# Patient Record
Sex: Male | Born: 1975 | Race: White | Hispanic: No | Marital: Married | Smoking: Former smoker
Health system: Southern US, Community
[De-identification: ages and names within clinical notes are randomized; demographics above are authoritative.]

## PROBLEM LIST (undated history)

## (undated) DIAGNOSIS — M1990 Unspecified osteoarthritis, unspecified site: Secondary | ICD-10-CM

## (undated) DIAGNOSIS — F32A Depression, unspecified: Secondary | ICD-10-CM

## (undated) DIAGNOSIS — F329 Major depressive disorder, single episode, unspecified: Secondary | ICD-10-CM

## (undated) DIAGNOSIS — G4733 Obstructive sleep apnea (adult) (pediatric): Secondary | ICD-10-CM

## (undated) DIAGNOSIS — F419 Anxiety disorder, unspecified: Secondary | ICD-10-CM

## (undated) DIAGNOSIS — E119 Type 2 diabetes mellitus without complications: Secondary | ICD-10-CM

## (undated) DIAGNOSIS — R42 Dizziness and giddiness: Secondary | ICD-10-CM

## (undated) DIAGNOSIS — R1115 Cyclical vomiting syndrome unrelated to migraine: Secondary | ICD-10-CM

## (undated) HISTORY — DX: Dizziness and giddiness: R42

## (undated) HISTORY — DX: Anxiety disorder, unspecified: F41.9

## (undated) HISTORY — DX: Obstructive sleep apnea (adult) (pediatric): G47.33

## (undated) HISTORY — DX: Depression, unspecified: F32.A

## (undated) HISTORY — DX: Unspecified osteoarthritis, unspecified site: M19.90

---
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
## 2004-12-12 DIAGNOSIS — E782 Mixed hyperlipidemia: Secondary | ICD-10-CM | POA: Insufficient documentation

## 2006-07-31 ENCOUNTER — Emergency Department: Payer: Self-pay | Admitting: Emergency Medicine

## 2008-03-11 ENCOUNTER — Ambulatory Visit: Payer: Self-pay | Admitting: Family Medicine

## 2008-06-17 HISTORY — PX: CHOLECYSTECTOMY: SHX55

## 2008-10-03 DIAGNOSIS — Z8249 Family history of ischemic heart disease and other diseases of the circulatory system: Secondary | ICD-10-CM | POA: Insufficient documentation

## 2008-11-20 ENCOUNTER — Encounter: Admission: RE | Admit: 2008-11-20 | Discharge: 2008-11-20 | Payer: Self-pay | Admitting: Psychiatry

## 2008-11-25 ENCOUNTER — Ambulatory Visit (HOSPITAL_COMMUNITY): Admission: RE | Admit: 2008-11-25 | Discharge: 2008-11-25 | Payer: Self-pay | Admitting: General Surgery

## 2008-11-25 ENCOUNTER — Encounter (INDEPENDENT_AMBULATORY_CARE_PROVIDER_SITE_OTHER): Payer: Self-pay | Admitting: General Surgery

## 2008-12-01 DIAGNOSIS — G44009 Cluster headache syndrome, unspecified, not intractable: Secondary | ICD-10-CM | POA: Insufficient documentation

## 2009-01-29 ENCOUNTER — Encounter: Admission: RE | Admit: 2009-01-29 | Discharge: 2009-01-29 | Payer: Self-pay | Admitting: Specialist

## 2009-01-31 ENCOUNTER — Ambulatory Visit (HOSPITAL_COMMUNITY): Admission: RE | Admit: 2009-01-31 | Discharge: 2009-01-31 | Payer: Self-pay | Admitting: Gastroenterology

## 2009-05-09 ENCOUNTER — Ambulatory Visit: Payer: Self-pay | Admitting: Family Medicine

## 2009-08-08 ENCOUNTER — Observation Stay: Payer: Self-pay | Admitting: Internal Medicine

## 2010-03-14 ENCOUNTER — Ambulatory Visit: Payer: Self-pay | Admitting: Family Medicine

## 2010-03-17 ENCOUNTER — Ambulatory Visit: Payer: Self-pay | Admitting: Family Medicine

## 2010-07-08 ENCOUNTER — Encounter: Payer: Self-pay | Admitting: Family Medicine

## 2010-07-08 DIAGNOSIS — G4733 Obstructive sleep apnea (adult) (pediatric): Secondary | ICD-10-CM | POA: Insufficient documentation

## 2010-09-24 LAB — CBC
HCT: 43.7 % (ref 39.0–52.0)
Hemoglobin: 14.8 g/dL (ref 13.0–17.0)
MCHC: 33.8 g/dL (ref 30.0–36.0)
MCV: 83 fL (ref 78.0–100.0)
Platelets: 325 10*3/uL (ref 150–400)
RBC: 5.27 MIL/uL (ref 4.22–5.81)
RDW: 15.3 % (ref 11.5–15.5)
WBC: 15.9 10*3/uL — ABNORMAL HIGH (ref 4.0–10.5)

## 2010-09-24 LAB — COMPREHENSIVE METABOLIC PANEL
ALT: 50 U/L (ref 0–53)
AST: 32 U/L (ref 0–37)
Albumin: 3.7 g/dL (ref 3.5–5.2)
Alkaline Phosphatase: 56 U/L (ref 39–117)
BUN: 16 mg/dL (ref 6–23)
CO2: 28 mEq/L (ref 19–32)
Calcium: 9.6 mg/dL (ref 8.4–10.5)
Chloride: 104 mEq/L (ref 96–112)
Creatinine, Ser: 0.72 mg/dL (ref 0.4–1.5)
GFR calc Af Amer: >60 mL/min (ref >60)
GFR calc non Af Amer: >60 mL/min (ref >60)
Glucose, Bld: 107 mg/dL — ABNORMAL HIGH (ref 70–99)
Potassium: 4.4 mEq/L (ref 3.5–5.1)
Sodium: 141 mEq/L (ref 135–145)
Total Bilirubin: 0.4 mg/dL (ref 0.3–1.2)
Total Protein: 7.5 g/dL (ref 6.0–8.3)

## 2010-09-24 LAB — DIFFERENTIAL
Basophils Absolute: 0.1 10*3/uL (ref 0.0–0.1)
Basophils Relative: 0 % (ref 0–1)
Eosinophils Absolute: 0.1 10*3/uL (ref 0.0–0.7)
Eosinophils Relative: 1 % (ref 0–5)
Lymphocytes Relative: 16 % (ref 12–46)
Lymphs Abs: 2.6 10*3/uL (ref 0.7–4.0)
Monocytes Absolute: 0.8 10*3/uL (ref 0.1–1.0)
Monocytes Relative: 5 % (ref 3–12)
Neutro Abs: 12.4 10*3/uL — ABNORMAL HIGH (ref 1.7–7.7)
Neutrophils Relative %: 78 % — ABNORMAL HIGH (ref 43–77)

## 2010-10-30 NOTE — Op Note (Signed)
NAMEBRONISLAUS, VERDELL               ACCOUNT NO.:  [id number]   MEDICAL RECORD NO.:  [id number]          PATIENT TYPE:  AMB   LOCATION:  DAY                          FACILITY:  [facility]   PHYSICIAN:  Cherylynn Ridges, M.D.    DATE OF BIRTH:  06-07-1976   DATE OF PROCEDURE:  11/25/2008  DATE OF DISCHARGE:                               OPERATIVE REPORT   Patient of Dr. Sherrie Mustache and Dr. Jeani Hawking.   PREOPERATIVE DIAGNOSIS:  Symptomatic cholelithiasis.   POSTOPERATIVE DIAGNOSIS:  Symptomatic cholelithiasis.   PROCEDURE:  Laparoscopic cholecystectomy with cholangiogram.   SURGEON:  Dr. Lindie Spruce.   ASSISTANT:  Dr. Derrell Lolling.   ANESTHESIA:  General endotracheal.   ESTIMATED BLOOD LOSS:  Less than 20 mL.   COMPLICATIONS:  None.   CONDITION:  Stable.   FINDINGS:  Normal intraoperative cholangiogram with a 3-cm gallstone  freely floating in the gallbladder.   INDICATIONS FOR OPERATION:  The patient is a 35 year old who had been  symptomatic from a large stone that had been lodged in the neck of his  gallbladder who comes in now for an elective laparoscopic  cholecystectomy.   OPERATION:  The patient was taken to the operating room and placed on  the table in supine position.  After an adequate general endotracheal  anesthetic was administered, he was prepped and draped in usual sterile  manner exposing the midline and the right upper quadrant.   A supraumbilical midline incision was made using a #15 blade and taken  down to the midline fascia.  We grabbed the fascia with two Kocher  clamps and then incised between the clamps using 15 blade into the  preperitoneal space.  We then bluntly dissected down through the  preperitoneal space, through the peritoneum, into the peritoneal cavity.  A pursestring suture of 0 Vicryl was passed around the fascial opening  and that secured in a Hassan cannula which was subsequently passed.   We insufflated carbon dioxide gas up to maximal intra-abdominal  pressure  of 50 mmHg through the [facility] [place] cannula.  We then placed the patient in  reverse Trendelenburg and the left side was tilted down.  Two right  upper quadrant 5-mm cannulas and a subxiphoid 11-mm cannula were passed  under direct vision.  With all cannulas in place, we were able to  dissect out the gallbladder and perform a cholangiogram.   The dome of the gallbladder was grabbed with a ratcheted grasper through  the lateral-most cannula, and a second one was passed onto the  infundibulum.  As retracted superiorly and laterally, we exposed  peritoneum overlying the triangle of Calot and hepatic duodenal  triangle.   We able to dissect out the cystic duct and the cystic artery, the duct  of which was fairly small.  However, we were able to get an adequate  window around both structures.  Proximal clip was placed along the  gallbladder side of the cystic duct, and then we made a cholecystic  ductotomy using laparoscopic scissors.  Once this was done, we able to  pass a Cook catheter which had been passed  through the anterior  abdominal wall into the cholecystoductotomy.  Cholangiogram was shown  which showed good flow into the duodenum, good proximal filling, no  intraductal filling defects, no dilatation.   Once the cholangiogram was completed, we removed the clip that secured  the catheter in place.  We removed the catheter and distally clipped the  cystic duct x3.  We transected the cystic duct.  The cystic artery was  clipped proximally and distally.  It was transected.  We then dissected  out the gallbladder from its bed with minimal difficulty although we did  enter into the gallbladder with spillage of bile but no stones.   We removed the gallbladder using a laparoscopic EndoCatch bag from the  supraumbilical site.  Electrocautery was used to obtain hemostasis in  the hepatic bed.  There was minimal bleeding.  We aspirated all fluid  and gas and also all biliary spillage  that was noted.  Once we had  aspirated all the fluid, gas and bowel spillage, we removed all  cannulas.   The supraumbilical fascia was closed using the pursestring suture which  was in place.  We injected 0.25% Marcaine with epinephrine at all sites  and closed the supraumbilical and subxiphoid skin sites using running  subcuticular stitch of 4-0 Monocryl.  The lateral cannula sites were  closed with Dermabond, Steri-Strips and Tegaderm.  Dermabond and  Tegaderm were placed in the subxiphoid supraumbilical sites also.  All  counts were correct including needles, sponges and instruments.      Cherylynn Ridges, M.D.  Electronically Signed     JOW/MEDQ  D:  11/25/2008  T:  11/25/2008  Job:  [id number]   cc:   Sherrie Mustache, M.D.   Jordan Hawks Elnoria Howard, MD  Fax: [phone]

## 2011-12-23 ENCOUNTER — Emergency Department: Payer: Self-pay | Admitting: Emergency Medicine

## 2011-12-23 HISTORY — PX: OTHER SURGICAL HISTORY: SHX169

## 2012-06-18 ENCOUNTER — Ambulatory Visit: Payer: Self-pay | Admitting: Family Medicine

## 2012-06-18 DIAGNOSIS — E278 Other specified disorders of adrenal gland: Secondary | ICD-10-CM | POA: Insufficient documentation

## 2012-06-18 HISTORY — PX: OTHER SURGICAL HISTORY: SHX169

## 2012-06-30 ENCOUNTER — Ambulatory Visit: Payer: Self-pay | Admitting: Family Medicine

## 2012-09-15 ENCOUNTER — Ambulatory Visit: Payer: Self-pay | Admitting: Family Medicine

## 2012-12-28 ENCOUNTER — Ambulatory Visit: Payer: Self-pay | Admitting: Family Medicine

## 2013-01-15 ENCOUNTER — Ambulatory Visit: Payer: Self-pay | Admitting: Family Medicine

## 2013-02-18 ENCOUNTER — Ambulatory Visit: Payer: Self-pay | Admitting: Family Medicine

## 2014-05-02 LAB — LIPID PANEL
Cholesterol: 234 mg/dL — AB (ref 0–200)
HDL: 38 mg/dL (ref 35–70)
LDL Cholesterol: 151 mg/dL
Triglycerides: 227 mg/dL — AB (ref 40–160)

## 2014-05-02 LAB — BASIC METABOLIC PANEL
BUN: 9 mg/dL (ref 4–21)
Creatinine: 0.6 mg/dL (ref 0.6–1.3)
Glucose: 156 mg/dL
Potassium: 4.9 mmol/L (ref 3.4–5.3)
Sodium: 138 mmol/L (ref 137–147)

## 2014-05-02 LAB — HEMOGLOBIN A1C: Hgb A1c MFr Bld: 6.6 % — AB (ref 4.0–6.0)

## 2014-05-02 LAB — TSH: TSH: 1.96 u[IU]/mL (ref 0.41–5.90)

## 2014-09-07 ENCOUNTER — Emergency Department: Payer: Self-pay | Admitting: Emergency Medicine

## 2014-09-09 LAB — COMPREHENSIVE METABOLIC PANEL
Albumin: 4.1 g/dL
Alkaline Phosphatase: 74 U/L
Anion Gap: 8 (ref 7–16)
BUN: 10 mg/dL
Bilirubin,Total: 0.5 mg/dL
Calcium, Total: 9.2 mg/dL
Chloride: 103 mmol/L
Co2: 28 mmol/L
Creatinine: 0.68 mg/dL
EGFR (African American): >60
EGFR (Non-African Amer.): >60
Glucose: 149 mg/dL — ABNORMAL HIGH
Potassium: 4 mmol/L
SGOT(AST): 29 U/L
SGPT (ALT): 33 U/L
Sodium: 139 mmol/L
Total Protein: 7.7 g/dL

## 2014-09-09 LAB — BASIC METABOLIC PANEL
Anion Gap: 8 (ref 7–16)
BUN: 10 mg/dL
Calcium, Total: 9.2 mg/dL
Chloride: 103 mmol/L
Co2: 28 mmol/L
Creatinine: 0.68 mg/dL
EGFR (African American): >60
EGFR (Non-African Amer.): >60
Glucose: 149 mg/dL — ABNORMAL HIGH
Potassium: 4 mmol/L
Sodium: 139 mmol/L

## 2014-09-09 LAB — CBC
HCT: 40.3 % (ref 40.0–52.0)
HGB: 13.6 g/dL (ref 13.0–18.0)
MCH: 27.8 pg (ref 26.0–34.0)
MCHC: 33.7 g/dL (ref 32.0–36.0)
MCV: 83 fL (ref 80–100)
Platelet: 269 10*3/uL (ref 150–440)
RBC: 4.88 10*6/uL (ref 4.40–5.90)
RDW: 15.4 % — ABNORMAL HIGH (ref 11.5–14.5)
WBC: 8.8 10*3/uL (ref 3.8–10.6)

## 2014-09-09 LAB — CK TOTAL AND CKMB (NOT AT ARMC)
CK, Total: 64 U/L
CK-MB: 1.2 ng/mL

## 2014-09-09 LAB — TROPONIN I: Troponin-I: <0.03 ng/mL

## 2015-01-03 ENCOUNTER — Ambulatory Visit (INDEPENDENT_AMBULATORY_CARE_PROVIDER_SITE_OTHER): Payer: BLUE CROSS/BLUE SHIELD | Admitting: Family Medicine

## 2015-01-03 ENCOUNTER — Encounter: Payer: Self-pay | Admitting: Family Medicine

## 2015-01-03 VITALS — BP 120/88 | HR 108 | Temp 98.5°F | Resp 16 | Ht 73.0 in | Wt 389.0 lb

## 2015-01-03 DIAGNOSIS — R002 Palpitations: Secondary | ICD-10-CM | POA: Insufficient documentation

## 2015-01-03 DIAGNOSIS — F509 Eating disorder, unspecified: Secondary | ICD-10-CM | POA: Insufficient documentation

## 2015-01-03 DIAGNOSIS — R252 Cramp and spasm: Secondary | ICD-10-CM | POA: Insufficient documentation

## 2015-01-03 DIAGNOSIS — E119 Type 2 diabetes mellitus without complications: Secondary | ICD-10-CM | POA: Diagnosis not present

## 2015-01-03 DIAGNOSIS — Z8249 Family history of ischemic heart disease and other diseases of the circulatory system: Secondary | ICD-10-CM

## 2015-01-03 DIAGNOSIS — R1084 Generalized abdominal pain: Secondary | ICD-10-CM | POA: Diagnosis not present

## 2015-01-03 DIAGNOSIS — R079 Chest pain, unspecified: Secondary | ICD-10-CM | POA: Insufficient documentation

## 2015-01-03 DIAGNOSIS — S39012A Strain of muscle, fascia and tendon of lower back, initial encounter: Secondary | ICD-10-CM | POA: Insufficient documentation

## 2015-01-03 DIAGNOSIS — R42 Dizziness and giddiness: Secondary | ICD-10-CM | POA: Insufficient documentation

## 2015-01-03 DIAGNOSIS — R109 Unspecified abdominal pain: Secondary | ICD-10-CM | POA: Insufficient documentation

## 2015-01-03 DIAGNOSIS — L918 Other hypertrophic disorders of the skin: Secondary | ICD-10-CM | POA: Insufficient documentation

## 2015-01-03 DIAGNOSIS — K8020 Calculus of gallbladder without cholecystitis without obstruction: Secondary | ICD-10-CM | POA: Insufficient documentation

## 2015-01-03 DIAGNOSIS — M545 Low back pain, unspecified: Secondary | ICD-10-CM | POA: Insufficient documentation

## 2015-01-03 DIAGNOSIS — G8929 Other chronic pain: Secondary | ICD-10-CM | POA: Insufficient documentation

## 2015-01-03 DIAGNOSIS — K219 Gastro-esophageal reflux disease without esophagitis: Secondary | ICD-10-CM | POA: Insufficient documentation

## 2015-01-03 DIAGNOSIS — H9190 Unspecified hearing loss, unspecified ear: Secondary | ICD-10-CM | POA: Insufficient documentation

## 2015-01-03 HISTORY — DX: Dizziness and giddiness: R42

## 2015-01-03 MED ORDER — OXYCODONE-ACETAMINOPHEN 7.5-325 MG PO TABS: 1.0000 | ORAL_TABLET | ORAL | Status: DC | PRN

## 2015-01-03 NOTE — Progress Notes (Signed)
Patient: Raymond Rose Male    DOB: 29-May-1976   39 y.o.   MRN: [id number] Visit Date: 01/03/2015  Today's Provider: [name], MD   Chief Complaint  Patient presents with  . Bloated   Subjective:    HPI   Bloating: Patient comes in today with a complaint of abdominal bloating that started 1 month ago. Patient states he has been feeling full and has had a decrease in his appetite. Patient has also had diarrhea, left upper quadrant abdominal pain, fatigue  and nausea. Last night patient states he saw blood in the toilet bowl after having a bowel movement. Similar symptoms off and on for a few years, but much more persistent and severe the last month. No food associations. Pain is worse in LUQ.   Diabetes:   Not checking sugars at home. No change in metformin. Has never had adverse effects from this medication. He is taking metformin every day.  Allergies  Allergen Reactions  . Morphine    Previous Medications   CYCLOBENZAPRINE (FLEXERIL) 5 MG TABLET    Take 1 tablet by mouth 3 (three) times daily as needed. For back spasms   ESOMEPRAZOLE (NEXIUM) 40 MG PACKET    Take 40 mg by mouth daily.    GLUCOSE BLOOD (ONETOUCH VERIO) TEST STRIP    1 strip daily.   HYDROCODONE-ACETAMINOPHEN (NORCO/VICODIN) 5-325 MG PER TABLET    Take 1-2 tablets by mouth every 4 (four) hours as needed.   MELOXICAM (MOBIC) 15 MG TABLET    Take 1 tablet by mouth daily. With food   METFORMIN (GLUCOPHAGE-XR) 500 MG 24 HR TABLET    Take 2 tablets by mouth every evening. With meals    Review of Systems  Constitutional: Positive for appetite change (decreased appitite) and fatigue. Negative for fever, chills and diaphoresis.  Respiratory: Negative for shortness of breath and wheezing.   Gastrointestinal: Positive for nausea, abdominal pain (upper left side of abdomen), diarrhea, blood in stool (last night ) and abdominal distention. Negative for vomiting, constipation, anal bleeding and rectal pain.    Genitourinary: Positive for frequency.    History  Substance Use Topics  . Smoking status: Former Smoker -- 5 years    Quit date: 06/17/1997  . Smokeless tobacco: Not on file  . Alcohol Use: 0.0 oz/week    0 Standard drinks or equivalent per week     Comment: occasional alcohol use   Objective:   BP 120/88 mmHg  Pulse 108  Temp(Src) 98.5 F (36.9 C) (Oral)  Resp 16  Ht 6\' 1"  (1.854 m)  Wt 389 lb (176.449 kg)  BMI 51.33 kg/m2  SpO2 97%  Physical Exam General Appearance:    Alert, cooperative, no distress, morbidly obese  Eyes:    PERRL, conjunctiva/corneas clear, EOM's intact       Lungs:     Clear to auscultation bilaterally, respirations unlabored  Heart:    Regular rate and rhythm  Neurologic:   Awake, alert, oriented x 3. No apparent focal neurological           defect.   Abdomen:   Obese, no masses, no tenderness. Normoactive bowel sounds all quadrants.         Assessment & Plan:     1.  Generalized abdominal pain Associated with bloating when eating and early satiety.  - Comprehensive metabolic panel - CBC - T4 AND TSH - Amylase - Lipase - DG UGI W/SMALL BOWEL; Future  3.  Type 2 diabetes mellitus without complication Taking metformin. Due for A1c. Not checking sugars.  - Hemoglobin A1c       [name], MD  [facility]

## 2015-01-04 LAB — CBC
Hematocrit: 42.2 % (ref 37.5–51.0)
Hemoglobin: 14.1 g/dL (ref 12.6–17.7)
MCH: 27.6 pg (ref 26.6–33.0)
MCHC: 33.4 g/dL (ref 31.5–35.7)
MCV: 83 fL (ref 79–97)
Platelets: 305 10*3/uL (ref 150–379)
RBC: 5.1 x10E6/uL (ref 4.14–5.80)
RDW: 15.3 % (ref 12.3–15.4)
WBC: 10.4 10*3/uL (ref 3.4–10.8)

## 2015-01-04 LAB — COMPREHENSIVE METABOLIC PANEL
ALT: 42 IU/L (ref 0–44)
AST: 37 IU/L (ref 0–40)
Albumin/Globulin Ratio: 1.4 (ref 1.1–2.5)
Albumin: 4.4 g/dL (ref 3.5–5.5)
Alkaline Phosphatase: 87 IU/L (ref 39–117)
BUN/Creatinine Ratio: 20 — ABNORMAL HIGH (ref 8–19)
BUN: 14 mg/dL (ref 6–20)
Bilirubin Total: 0.5 mg/dL (ref 0.0–1.2)
CO2: 22 mmol/L (ref 18–29)
Calcium: 9.7 mg/dL (ref 8.7–10.2)
Chloride: 97 mmol/L (ref 97–108)
Creatinine, Ser: 0.7 mg/dL — ABNORMAL LOW (ref 0.76–1.27)
GFR calc Af Amer: 140 mL/min/{1.73_m2} (ref >59)
GFR calc non Af Amer: 121 mL/min/{1.73_m2} (ref >59)
Globulin, Total: 3.1 g/dL (ref 1.5–4.5)
Glucose: 203 mg/dL — ABNORMAL HIGH (ref 65–99)
Potassium: 4.6 mmol/L (ref 3.5–5.2)
Sodium: 138 mmol/L (ref 134–144)
Total Protein: 7.5 g/dL (ref 6.0–8.5)

## 2015-01-04 LAB — HEMOGLOBIN A1C
Est. average glucose Bld gHb Est-mCnc: 177 mg/dL
Hgb A1c MFr Bld: 7.8 % — ABNORMAL HIGH (ref 4.8–5.6)

## 2015-01-04 LAB — T4 AND TSH
T4, Total: 8 ug/dL (ref 4.5–12.0)
TSH: 2.25 u[IU]/mL (ref 0.450–4.500)

## 2015-01-04 LAB — AMYLASE: Amylase: 24 U/L — ABNORMAL LOW (ref 31–124)

## 2015-01-04 LAB — LIPASE: Lipase: 23 U/L (ref 0–59)

## 2015-03-02 ENCOUNTER — Other Ambulatory Visit: Payer: Self-pay | Admitting: Family Medicine

## 2015-03-02 NOTE — Telephone Encounter (Signed)
Pt contacted office for refill request on the following medications:  oxyCODONE-acetaminophen (PERCOCET) 7.5-325 MG.  CB#[phone]/MW

## 2015-03-03 MED ORDER — OXYCODONE-ACETAMINOPHEN 7.5-325 MG PO TABS: 1.0000 | ORAL_TABLET | ORAL | Status: DC | PRN

## 2015-04-07 ENCOUNTER — Other Ambulatory Visit: Payer: Self-pay

## 2015-04-07 MED ORDER — OXYCODONE-ACETAMINOPHEN 7.5-325 MG PO TABS: 1.0000 | ORAL_TABLET | ORAL | Status: DC | PRN

## 2015-04-07 NOTE — Telephone Encounter (Signed)
Patient calling requesting a refill on his OXycodone. Please review-aa

## 2015-05-10 ENCOUNTER — Telehealth: Payer: Self-pay | Admitting: Family Medicine

## 2015-05-10 NOTE — Telephone Encounter (Signed)
OTC lotrimin cream for 7-10 days. O.V if not clearing up with Lotrimin.

## 2015-05-10 NOTE — Telephone Encounter (Signed)
Pt called having skin irritation in his groin area.  He has been using OTC but not helping this time  Please advise [phone]  Thanks teri

## 2015-05-10 NOTE — Telephone Encounter (Signed)
Called patient and advised him as below. Patient states that he is already taking Lotrimin for the past week with not relief. I consulted with Dr. Caryn Section who recommend that patient go to an Urgent Care to have the skin irritation evaluated and treated, since patient is unable to come in the office for an appointment at this time.  Patient verbally voiced understanding.

## 2015-05-10 NOTE — Telephone Encounter (Signed)
Patient called back to check on the message that was taken earlier. Patient states he has had a skin irritation in his groin/ penis for 2 weeks. He states that it burns when urine gets on that part of the skin. He reports the akin irritation is itchy. He has tried using OTC Jock itch cream, A&D Ointment and Desitin cream with no relief. Patient wants to know if Dr. Caryn Section could call in something to the pharmacy or recommend something over the counter. Patient states he has had an irritation like this in the past but it didn't last this long. I advised patient that he needs to be seen for an office visit. Patient declined and states he is working out of town and wont be able to come in until next week. Patient requested that I send a message to check with Dr. Caryn Section on whether a prescription could be called in.

## 2015-05-23 ENCOUNTER — Other Ambulatory Visit: Payer: Self-pay | Admitting: Family Medicine

## 2015-05-23 MED ORDER — METFORMIN HCL ER 500 MG PO TB24: 1000.0000 mg | ORAL_TABLET | Freq: Every evening | ORAL | Status: DC

## 2015-05-23 MED ORDER — OXYCODONE-ACETAMINOPHEN 7.5-325 MG PO TABS: 1.0000 | ORAL_TABLET | ORAL | Status: DC | PRN

## 2015-05-23 NOTE — Telephone Encounter (Signed)
Pt needs refills    oxyCODONE-acetaminophen (PERCOCET) 7.5-325 MG tablet   04/07/15 -- Birdie Sons, MD    Take 1 tablet by mouth every 4 (four) hours as needed for severe pain.      metFORMIN (GLUCOPHAGE-XR) 500 MG 24 hr tablet   Call back is [phone]  Thanks Con Memos

## 2015-05-23 NOTE — Telephone Encounter (Signed)
Prescriptions refilled. He is due for diabetes follow up. Please have him schedule appointment within the month.

## 2015-06-22 ENCOUNTER — Ambulatory Visit (INDEPENDENT_AMBULATORY_CARE_PROVIDER_SITE_OTHER): Payer: BLUE CROSS/BLUE SHIELD | Admitting: Family Medicine

## 2015-06-22 ENCOUNTER — Encounter: Payer: Self-pay | Admitting: Family Medicine

## 2015-06-22 VITALS — BP 116/74 | HR 87 | Temp 98.8°F | Resp 16 | Ht 73.0 in | Wt 380.0 lb

## 2015-06-22 DIAGNOSIS — F329 Major depressive disorder, single episode, unspecified: Secondary | ICD-10-CM | POA: Diagnosis not present

## 2015-06-22 DIAGNOSIS — R1012 Left upper quadrant pain: Secondary | ICD-10-CM

## 2015-06-22 DIAGNOSIS — F410 Panic disorder [episodic paroxysmal anxiety] without agoraphobia: Secondary | ICD-10-CM | POA: Diagnosis not present

## 2015-06-22 DIAGNOSIS — N4889 Other specified disorders of penis: Secondary | ICD-10-CM

## 2015-06-22 DIAGNOSIS — F32A Depression, unspecified: Secondary | ICD-10-CM

## 2015-06-22 DIAGNOSIS — E119 Type 2 diabetes mellitus without complications: Secondary | ICD-10-CM | POA: Diagnosis not present

## 2015-06-22 DIAGNOSIS — F331 Major depressive disorder, recurrent, moderate: Secondary | ICD-10-CM | POA: Insufficient documentation

## 2015-06-22 LAB — POCT GLYCOSYLATED HEMOGLOBIN (HGB A1C)
Est. average glucose Bld gHb Est-mCnc: 180
Hemoglobin A1C: 7.9

## 2015-06-22 LAB — POCT URINALYSIS DIPSTICK
Bilirubin, UA: NEGATIVE
Blood, UA: NEGATIVE
Glucose, UA: 500
Leukocytes, UA: NEGATIVE
Nitrite, UA: NEGATIVE
Protein, UA: NEGATIVE
Spec Grav, UA: >=1.03
Urobilinogen, UA: 0.2
pH, UA: 6

## 2015-06-22 MED ORDER — ESCITALOPRAM OXALATE 10 MG PO TABS: ORAL_TABLET | ORAL | Status: DC

## 2015-06-22 NOTE — Progress Notes (Signed)
Patient: Raymond Rose Male    DOB: 27-Feb-1976   40 y.o.   MRN: [id number] Visit Date: 06/22/2015  Today's Provider: [name], MD   Chief Complaint  Patient presents with  . Abdominal Pain   Subjective:    Abdominal Pain This is a recurrent problem. The current episode started more than 1 year ago. The onset quality is gradual. The problem occurs constantly. The problem has been unchanged. The pain is located in the LUQ. The pain is at a severity of 6/10. The pain is moderate. The quality of the pain is dull, aching and sharp. The abdominal pain does not radiate. Associated symptoms include belching, constipation, diarrhea, dysuria, flatus, frequency, myalgias and nausea. Pertinent negatives include no fever, headaches, hematuria or vomiting. The pain is aggravated by eating and movement. The pain is relieved by nothing. He has tried antacids for the symptoms. The treatment provided no relief.   Patient was seen for office visit 01/03/2015 for similar symptoms. He had normal cbc met c, thyroid, and pancreatic functions. Upper GI was ordered, but was not scheduled and he has not followed up since. He states symptoms are unchanged.   He also complains of burning around end or penis which has greatly improved after starting Monistat.   Wt Readings from Last 3 Encounters:  06/22/15 380 lb (172.367 kg)  01/03/15 389 lb (176.449 kg)  10/11/14 384 lb (174.181 kg)    Patient also reports he has spells of depression a few times a year, but has also been having frequent episodes of feeling very anxious with heart racing, feeling lightheaded and nervous that resolves after several minutes. He is interested in try antidepressant and anti-anxiety medication.    Allergies  Allergen Reactions  . Morphine    Previous Medications   CYCLOBENZAPRINE (FLEXERIL) 5 MG TABLET    Take 1 tablet by mouth 3 (three) times daily as needed. Reported on 06/22/2015   ESOMEPRAZOLE (NEXIUM) 40 MG PACKET     Take 40 mg by mouth daily.    GLUCOSE BLOOD (ONETOUCH VERIO) TEST STRIP    1 strip daily.   MELOXICAM (MOBIC) 15 MG TABLET    Take 1 tablet by mouth daily. With food   METFORMIN (GLUCOPHAGE-XR) 500 MG 24 HR TABLET    Take 2 tablets (1,000 mg total) by mouth every evening. With meals   OXYCODONE-ACETAMINOPHEN (PERCOCET) 7.5-325 MG TABLET    Take 1 tablet by mouth every 4 (four) hours as needed for severe pain.    Review of Systems  Constitutional: Positive for appetite change. Negative for fever and chills.       Decreased appetite  Respiratory: Negative for chest tightness, shortness of breath and wheezing.   Cardiovascular: Negative for chest pain and palpitations.  Gastrointestinal: Positive for nausea, abdominal pain, diarrhea, constipation and flatus. Negative for vomiting.  Genitourinary: Positive for dysuria, frequency and penile pain. Negative for hematuria.  Musculoskeletal: Positive for myalgias.  Neurological: Negative for headaches.    Social History  Substance Use Topics  . Smoking status: Former Smoker -- 5 years    Quit date: 06/17/1997  . Smokeless tobacco: Not on file  . Alcohol Use: 0.0 oz/week    0 Standard drinks or equivalent per week     Comment: occasional alcohol use   Objective:   BP 116/74 mmHg  Pulse 87  Temp(Src) 98.8 F (37.1 C) (Oral)  Resp 16  Ht 6' 1" (1.854 m)  Wt 380 lb (  172.367 kg)  BMI 50.15 kg/m2  SpO2 98%  Physical Exam  General Appearance:    Alert, cooperative, no distress, obese  Eyes:    PERRL, conjunctiva/corneas clear, EOM's intact       Lungs:     Clear to auscultation bilaterally, respirations unlabored  Heart:    Regular rate and rhythm  Neurologic:   Awake, alert, oriented x 3. No apparent focal neurological           defect.   Abd:   Moderate upper abdominal tenderness worse LUQ.       Results for orders placed or performed in visit on 06/22/15  POCT glycosylated hemoglobin (Hb A1C)  Result Value Ref Range    Hemoglobin A1C 7.9    Est. average glucose Bld gHb Est-mCnc 180   POCT urinalysis dipstick  Result Value Ref Range   Color, UA Dark Yellow    Clarity, UA Cloudy    Glucose, UA 500    Bilirubin, UA Neg    Ketones, UA Trace    Spec Grav, UA >=1.030    Blood, UA Neg    pH, UA 6.0    Protein, UA Neg    Urobilinogen, UA 0.2    Nitrite, UA Neg    Leukocytes, UA Negative Negative                                 Results for orders placed or performed in visit on 06/22/15  POCT glycosylated hemoglobin (Hb A1C)  Result Value Ref Range   Hemoglobin A1C 7.9    Est. average glucose Bld gHb Est-mCnc 180   POCT urinalysis dipstick  Result Value Ref Range   Color, UA Dark Yellow    Clarity, UA Cloudy    Glucose, UA 500    Bilirubin, UA Neg    Ketones, UA Trace    Spec Grav, UA >=1.030    Blood, UA Neg    pH, UA 6.0    Protein, UA Neg    Urobilinogen, UA 0.2    Nitrite, UA Neg    Leukocytes, UA Negative Negative      Assessment & Plan:     1. Type 2 diabetes mellitus without complication, without long-term current use of insulin (HCC) A1c is a little high, but likely having labile blood sugars consider he is spilling glucose in urine. Advised that he needs to start check blood sugars every day at different times and get on stricter diet - POCT glycosylated hemoglobin (Hb A1C)  2. Penile pain Resolve with monistat, likely exacerbated by elevated blood sugars which he is going to work on.  - POCT urinalysis dipstick  3. Panic attacks Start SSRI and follow up in 3-4 weeks.  - escitalopram (LEXAPRO) 10 MG tablet; 1/2 tablet daily for 6 days      then increase to 1 tablet daily  Dispense: 30 tablet; Refill: 1  4. Depression  - escitalopram (LEXAPRO) 10 MG tablet; 1/2 tablet daily for 6 days      then increase to 1 tablet daily  Dispense: 30 tablet; Refill: 1  5. Left upper quadrant pain  - DG UGI W/SMALL BOWEL; Future       [name], MD  [facility]

## 2015-06-23 ENCOUNTER — Ambulatory Visit: Payer: BLUE CROSS/BLUE SHIELD | Admitting: Family Medicine

## 2015-06-26 ENCOUNTER — Ambulatory Visit: Payer: BLUE CROSS/BLUE SHIELD | Admitting: Family Medicine

## 2015-07-10 ENCOUNTER — Ambulatory Visit: Admission: RE | Admit: 2015-07-10 | Payer: Self-pay | Source: Ambulatory Visit

## 2015-07-17 ENCOUNTER — Ambulatory Visit: Payer: BLUE CROSS/BLUE SHIELD | Admitting: Obstetrics and Gynecology

## 2015-07-18 ENCOUNTER — Ambulatory Visit: Payer: BLUE CROSS/BLUE SHIELD | Admitting: Family Medicine

## 2015-07-25 ENCOUNTER — Telehealth: Payer: Self-pay | Admitting: Family Medicine

## 2015-07-25 NOTE — Telephone Encounter (Signed)
Can you please see if patient's upper gi can be rescheduled for march as requested below. Thanks.

## 2015-07-25 NOTE — Telephone Encounter (Signed)
Patient reports that he would like this to be rescheduled for the 1st week in March if possible. He reports that he was unable to get off of work the day he originally had it scheduled.

## 2015-07-25 NOTE — Telephone Encounter (Signed)
Please advise patient we never received report for upper GI that was ordered in January. Does he need to have this rescheduled?

## 2015-08-02 NOTE — Telephone Encounter (Signed)
Appointment for upper GI rescheduled at [facility] ([facility]) 08/16/15 at 9:00.Pt advised

## 2015-08-04 ENCOUNTER — Other Ambulatory Visit: Payer: Self-pay | Admitting: Family Medicine

## 2015-08-04 MED ORDER — METFORMIN HCL ER 500 MG PO TB24: 1000.0000 mg | ORAL_TABLET | Freq: Every evening | ORAL | Status: DC

## 2015-08-04 MED ORDER — OXYCODONE-ACETAMINOPHEN 7.5-325 MG PO TABS: 1.0000 | ORAL_TABLET | ORAL | Status: DC | PRN

## 2015-08-04 NOTE — Telephone Encounter (Signed)
Pt contacted office for refill request on the following medications: 1. oxyCODONE-acetaminophen (PERCOCET) 7.5-325 MG tablet  2. metFORMIN (GLUCOPHAGE-XR) 500 MG 24 hr tablet **Pt would like this RX sent to [facility]. Thanks TNP

## 2015-08-15 ENCOUNTER — Ambulatory Visit: Payer: BLUE CROSS/BLUE SHIELD | Admitting: Family Medicine

## 2015-08-16 ENCOUNTER — Ambulatory Visit: Payer: Self-pay

## 2015-09-01 ENCOUNTER — Other Ambulatory Visit: Payer: Self-pay | Admitting: Family Medicine

## 2015-09-01 DIAGNOSIS — F410 Panic disorder [episodic paroxysmal anxiety] without agoraphobia: Secondary | ICD-10-CM

## 2015-09-01 DIAGNOSIS — F329 Major depressive disorder, single episode, unspecified: Secondary | ICD-10-CM

## 2015-09-01 DIAGNOSIS — F32A Depression, unspecified: Secondary | ICD-10-CM

## 2015-09-01 MED ORDER — OXYCODONE-ACETAMINOPHEN 7.5-325 MG PO TABS: 1.0000 | ORAL_TABLET | ORAL | Status: DC | PRN

## 2015-09-01 MED ORDER — ESCITALOPRAM OXALATE 10 MG PO TABS: 10.0000 mg | ORAL_TABLET | Freq: Every day | ORAL | Status: DC

## 2015-09-01 NOTE — Telephone Encounter (Signed)
Pt contacted office for refill request on the following medications:  [facility].  CB#[phone]/MW    oxyCODONE-acetaminophen (PERCOCET) 7.5-325 MG tablet  escitalopram (LEXAPRO) 10 MG tablet

## 2015-09-06 LAB — HM DIABETES EYE EXAM

## 2015-09-07 ENCOUNTER — Encounter: Payer: Self-pay | Admitting: *Deleted

## 2015-09-11 ENCOUNTER — Ambulatory Visit: Payer: BLUE CROSS/BLUE SHIELD | Admitting: Family Medicine

## 2015-10-16 ENCOUNTER — Other Ambulatory Visit: Payer: Self-pay | Admitting: Family Medicine

## 2015-10-16 NOTE — Telephone Encounter (Signed)
Pt needs refill oxyCODONE-acetaminophen (PERCOCET) 7.5-325 MG tablet   ThanksTeri

## 2015-10-17 MED ORDER — OXYCODONE-ACETAMINOPHEN 7.5-325 MG PO TABS: 1.0000 | ORAL_TABLET | ORAL | Status: DC | PRN

## 2015-10-20 ENCOUNTER — Encounter: Payer: Self-pay | Admitting: Family Medicine

## 2015-10-20 ENCOUNTER — Ambulatory Visit (INDEPENDENT_AMBULATORY_CARE_PROVIDER_SITE_OTHER): Payer: BLUE CROSS/BLUE SHIELD | Admitting: Family Medicine

## 2015-10-20 VITALS — BP 112/68 | HR 86 | Temp 98.5°F | Resp 16 | Wt 386.0 lb

## 2015-10-20 DIAGNOSIS — N342 Other urethritis: Secondary | ICD-10-CM | POA: Diagnosis not present

## 2015-10-20 DIAGNOSIS — B370 Candidal stomatitis: Secondary | ICD-10-CM | POA: Diagnosis not present

## 2015-10-20 DIAGNOSIS — N5089 Other specified disorders of the male genital organs: Secondary | ICD-10-CM

## 2015-10-20 MED ORDER — FLUCONAZOLE 150 MG PO TABS: 150.0000 mg | ORAL_TABLET | Freq: Once | ORAL | Status: DC

## 2015-10-20 NOTE — Progress Notes (Signed)
Patient ID: Raymond Rose, male   DOB: Oct 22, 1975, 40 y.o.   MRN: [id number]       Patient: Raymond Rose Male    DOB: April 18, 1976   40 y.o.   MRN: [id number] Visit Date: 10/20/2015  Today's Provider: [name], MD   Chief Complaint  Patient presents with  . Mouth Lesions   Subjective:    HPI States when he looked in the mirror this morning he had several white spots on his tongue and his tongue had whitish film covering it . Has mostly cleared now.   Patient also mentions that he has had sores in his genital area for the last 3 weeks. Patient reports that he has had this before, but they went away. He noticed it the morning after having intercourse 3 weeks ago, associated with burning sensation and swelling near the head of his penis. He had some leftover cephalexin from years ago that he took for about a week. Swelling resolved, but is still burning around foreskin on left and meatus. He states he often has similar symptoms after intercourse.      Allergies  Allergen Reactions  . Morphine    Previous Medications   CYCLOBENZAPRINE (FLEXERIL) 5 MG TABLET    Take 1 tablet by mouth 3 (three) times daily as needed. Reported on 10/20/2015   ESCITALOPRAM (LEXAPRO) 10 MG TABLET    Take 1 tablet (10 mg total) by mouth daily.   ESOMEPRAZOLE (NEXIUM) 40 MG PACKET    Take 40 mg by mouth daily.    GLUCOSE BLOOD (ONETOUCH VERIO) TEST STRIP    1 strip daily. Reported on 10/20/2015   MELOXICAM (MOBIC) 15 MG TABLET    Take 1 tablet by mouth daily. Reported on 10/20/2015   METFORMIN (GLUCOPHAGE-XR) 500 MG 24 HR TABLET    Take 2 tablets (1,000 mg total) by mouth every evening. With meals   OXYCODONE-ACETAMINOPHEN (PERCOCET) 7.5-325 MG TABLET    Take 1 tablet by mouth every 4 (four) hours as needed for severe pain.    Review of Systems  Constitutional: Negative.   HENT: Positive for mouth sores.   Genitourinary: Positive for genital sores. Negative for dysuria, urgency, frequency, hematuria,  flank pain, decreased urine volume, discharge, penile swelling, scrotal swelling, enuresis, difficulty urinating, penile pain and testicular pain.    Social History  Substance Use Topics  . Smoking status: Former Smoker -- 5 years    Quit date: 06/17/1997  . Smokeless tobacco: Not on file  . Alcohol Use: 0.0 oz/week    0 Standard drinks or equivalent per week     Comment: occasional alcohol use   Objective:   BP 112/68 mmHg  Pulse 86  Temp(Src) 98.5 F (36.9 C)  Resp 16  Wt 386 lb (175.088 kg)  Physical Exam  General appearance: alert, well developed, well nourished, cooperative and in no distress Mouth: Few light white patches dorsal surface of tongue.  GU: Few small papules around foreskin and meatus, slightly skin, and tender.      Assessment & Plan:     1. Genital lesion, male  - fluconazole (DIFLUCAN) 150 MG tablet; Take 1 tablet (150 mg total) by mouth once.  Dispense: 1 tablet; Refill: 0 - Virus culture  2. Urethritis  - fluconazole (DIFLUCAN) 150 MG tablet; Take 1 tablet (150 mg total) by mouth once.  Dispense: 1 tablet; Refill: 0  3. Thrush  - fluconazole (DIFLUCAN) 150 MG tablet; Take 1 tablet (150 mg total) by mouth  once.  Dispense: 1 tablet; Refill: 0       [name], MD  [facility]

## 2015-10-24 ENCOUNTER — Ambulatory Visit: Payer: BLUE CROSS/BLUE SHIELD | Admitting: Urology

## 2015-10-26 ENCOUNTER — Telehealth: Payer: Self-pay | Admitting: Family Medicine

## 2015-10-26 ENCOUNTER — Other Ambulatory Visit: Payer: Self-pay | Admitting: Family Medicine

## 2015-10-26 DIAGNOSIS — N5089 Other specified disorders of the male genital organs: Secondary | ICD-10-CM

## 2015-10-26 MED ORDER — KETOCONAZOLE 2 % EX CREA: TOPICAL_CREAM | CUTANEOUS | Status: DC

## 2015-10-26 NOTE — Telephone Encounter (Signed)
Advised patient of results.  

## 2015-10-26 NOTE — Telephone Encounter (Signed)
Pt is requesting lab results.  CB#[phone]/MW

## 2015-10-30 LAB — VIRUS CULTURE

## 2015-11-15 ENCOUNTER — Other Ambulatory Visit: Payer: Self-pay | Admitting: Family Medicine

## 2015-11-15 NOTE — Telephone Encounter (Signed)
Pt contacted office for refill request on the following medications: ° °oxyCODONE-acetaminophen (PERCOCET) 7.5-325 MG tablet  ° °CB#[phone]/MW °

## 2015-11-16 MED ORDER — OXYCODONE-ACETAMINOPHEN 7.5-325 MG PO TABS: 1.0000 | ORAL_TABLET | ORAL | Status: DC | PRN

## 2015-11-28 ENCOUNTER — Other Ambulatory Visit: Payer: Self-pay | Admitting: Family Medicine

## 2015-12-15 ENCOUNTER — Other Ambulatory Visit: Payer: Self-pay | Admitting: Family Medicine

## 2015-12-18 ENCOUNTER — Other Ambulatory Visit: Payer: Self-pay | Admitting: Family Medicine

## 2015-12-18 NOTE — Telephone Encounter (Signed)
Please advise refill? 

## 2015-12-18 NOTE — Telephone Encounter (Signed)
Pt contacted office for refill request on the following medications: oxyCODONE-acetaminophen (PERCOCET) 7.5-325 MG tablet. Last Written: 11/16/15 Last OV: 10/20/15 Pt stated that he has about 4 pills left. Pt was advised that Dr. Caryn Section is gone for the day and we are closed tomorrow for the holiday. Please advise. Thanks TNP

## 2015-12-20 MED ORDER — OXYCODONE-ACETAMINOPHEN 7.5-325 MG PO TABS: 1.0000 | ORAL_TABLET | ORAL | Status: DC | PRN

## 2016-01-02 ENCOUNTER — Other Ambulatory Visit: Payer: Self-pay | Admitting: *Deleted

## 2016-01-02 DIAGNOSIS — F410 Panic disorder [episodic paroxysmal anxiety] without agoraphobia: Secondary | ICD-10-CM

## 2016-01-02 DIAGNOSIS — F329 Major depressive disorder, single episode, unspecified: Secondary | ICD-10-CM

## 2016-01-02 DIAGNOSIS — F32A Depression, unspecified: Secondary | ICD-10-CM

## 2016-01-02 MED ORDER — ESCITALOPRAM OXALATE 10 MG PO TABS: 10.0000 mg | ORAL_TABLET | Freq: Every day | ORAL | Status: DC

## 2016-01-18 ENCOUNTER — Other Ambulatory Visit: Payer: Self-pay | Admitting: Family Medicine

## 2016-01-18 MED ORDER — OXYCODONE-ACETAMINOPHEN 7.5-325 MG PO TABS: 1.0000 | ORAL_TABLET | ORAL | 0 refills | Status: DC | PRN

## 2016-01-18 NOTE — Telephone Encounter (Signed)
Pt requesting refill of oxyCODONE-acetaminophen (PERCOCET) 7.5-325 MG tablet

## 2016-02-01 ENCOUNTER — Other Ambulatory Visit: Payer: Self-pay | Admitting: Family Medicine

## 2016-02-01 DIAGNOSIS — F329 Major depressive disorder, single episode, unspecified: Secondary | ICD-10-CM

## 2016-02-01 DIAGNOSIS — F410 Panic disorder [episodic paroxysmal anxiety] without agoraphobia: Secondary | ICD-10-CM

## 2016-02-01 DIAGNOSIS — F32A Depression, unspecified: Secondary | ICD-10-CM

## 2016-02-08 ENCOUNTER — Other Ambulatory Visit: Payer: Self-pay | Admitting: Family Medicine

## 2016-02-08 DIAGNOSIS — N342 Other urethritis: Secondary | ICD-10-CM

## 2016-02-08 DIAGNOSIS — B370 Candidal stomatitis: Secondary | ICD-10-CM

## 2016-02-08 DIAGNOSIS — N5089 Other specified disorders of the male genital organs: Secondary | ICD-10-CM

## 2016-02-08 MED ORDER — OXYCODONE-ACETAMINOPHEN 7.5-325 MG PO TABS: 1.0000 | ORAL_TABLET | ORAL | 0 refills | Status: DC | PRN

## 2016-02-08 MED ORDER — MELOXICAM 15 MG PO TABS: 15.0000 mg | ORAL_TABLET | Freq: Every day | ORAL | 3 refills | Status: DC

## 2016-02-08 MED ORDER — FLUCONAZOLE 150 MG PO TABS: 150.0000 mg | ORAL_TABLET | Freq: Once | ORAL | 0 refills | Status: DC

## 2016-02-08 NOTE — Telephone Encounter (Signed)
Pt contacted office for refill request on the following medications: 1. oxyCODONE-acetaminophen (PERCOCET) 7.5-325 MG tablet Last Written: 01/18/16 2. meloxicam (MOBIC) 15 MG tablet Last written: 09/09/14 3. fluconazole (DIFLUCAN) 150 MG tablet (Pt stated needed medication for the same reason as last time. Last written: 10/20/15  Last OV:10/20/15 Pt stated since he has to pick up hard script for pain medication he can pick up all 3 at the same time. Please advise. Thanks TNP

## 2016-02-14 ENCOUNTER — Ambulatory Visit (INDEPENDENT_AMBULATORY_CARE_PROVIDER_SITE_OTHER): Payer: BLUE CROSS/BLUE SHIELD | Admitting: Family Medicine

## 2016-02-14 ENCOUNTER — Encounter: Payer: Self-pay | Admitting: Family Medicine

## 2016-02-14 VITALS — BP 120/76 | HR 98 | Temp 98.1°F | Resp 16 | Wt 382.0 lb

## 2016-02-14 DIAGNOSIS — N342 Other urethritis: Secondary | ICD-10-CM

## 2016-02-14 DIAGNOSIS — F32A Depression, unspecified: Secondary | ICD-10-CM

## 2016-02-14 DIAGNOSIS — K915 Postcholecystectomy syndrome: Secondary | ICD-10-CM | POA: Diagnosis not present

## 2016-02-14 DIAGNOSIS — M545 Low back pain: Secondary | ICD-10-CM | POA: Diagnosis not present

## 2016-02-14 DIAGNOSIS — F329 Major depressive disorder, single episode, unspecified: Secondary | ICD-10-CM

## 2016-02-14 DIAGNOSIS — M6208 Separation of muscle (nontraumatic), other site: Secondary | ICD-10-CM | POA: Diagnosis not present

## 2016-02-14 DIAGNOSIS — E119 Type 2 diabetes mellitus without complications: Secondary | ICD-10-CM | POA: Diagnosis not present

## 2016-02-14 DIAGNOSIS — E669 Obesity, unspecified: Secondary | ICD-10-CM | POA: Diagnosis not present

## 2016-02-14 LAB — POCT UA - MICROALBUMIN: Microalbumin Ur, POC: 50 mg/L

## 2016-02-14 LAB — POCT GLYCOSYLATED HEMOGLOBIN (HGB A1C)
Est. average glucose Bld gHb Est-mCnc: 217
Hemoglobin A1C: 9.2

## 2016-02-14 MED ORDER — LIRAGLUTIDE 18 MG/3ML ~~LOC~~ SOPN: PEN_INJECTOR | SUBCUTANEOUS | 0 refills | Status: DC

## 2016-02-14 MED ORDER — LIRAGLUTIDE -WEIGHT MANAGEMENT 18 MG/3ML ~~LOC~~ SOPN: 3.0000 mg | PEN_INJECTOR | Freq: Every day | SUBCUTANEOUS | 3 refills | Status: DC

## 2016-02-14 MED ORDER — OXYCODONE-ACETAMINOPHEN 7.5-325 MG PO TABS: 1.0000 | ORAL_TABLET | ORAL | 0 refills | Status: DC | PRN

## 2016-02-14 MED ORDER — CHOLESTYRAMINE 4 G PO PACK: 4.0000 g | PACK | Freq: Three times a day (TID) | ORAL | 12 refills | Status: DC

## 2016-02-14 NOTE — Patient Instructions (Signed)
If Raymond Rose is approved, start taking 2.4mg  a day one week after Victoza samples are finished. After one week, increase to 3.0mg  a day

## 2016-02-14 NOTE — Progress Notes (Signed)
Patient: Raymond Rose Male    DOB: 1975/10/03   40 y.o.   MRN: [id number] Visit Date: 02/14/2016  Today's Provider: [name], MD   Chief Complaint  Patient presents with  . Diabetes    follow up  . Panic Attack    follow up  . Depression    follow up   Subjective:    HPI  Diabetes Mellitus Type II, Follow-up:   Lab Results  Component Value Date   HGBA1C 7.9 06/22/2015   HGBA1C 7.8 (H) 01/03/2015   HGBA1C 6.6 (A) 05/02/2014    Last seen for diabetes 7 months ago.  Management since then includes advising patient to check blood sugars daily and get strict with diet. He reports fair compliance with treatment. Patient has been taking medication, but has not been strict with his diet or checking his blood sugars.  He is not having side effects.  Current symptoms include none and have been stable. Home blood sugar records: are not being checked  Episodes of hypoglycemia? no   Current Insulin Regimen: none Most Recent Eye Exam: 4 months ago Weight trend: stable Prior visit with dietician: no Current diet: well balanced Current exercise: golfing  Pertinent Labs:    Component Value Date/Time   CHOL 234 (A) 05/02/2014   TRIG 227 (A) 05/02/2014   HDL 38 05/02/2014   LDLCALC 151 05/02/2014   CREATININE 0.70 (L) 01/03/2015 1146   CREATININE 0.68 09/07/2014 1206   CREATININE 0.68 09/07/2014 1206    Wt Readings from Last 3 Encounters:  02/14/16 (!) 382 lb (173.3 kg)  10/20/15 (!) 386 lb (175.1 kg)  06/22/15 (!) 380 lb (172.4 kg)    ------------------------------------------------------------------------ Follow up Panic attack and Depression: Patient was last seen for this problem 7 months ago. Changes made during that visit includes starting Lexapro. Patient reports good compliance with treatment, good tolerance and good symptom control. Patient feels that his symptoms are controled since starting Lexapro.     Has been having recurrent yeast  infections which respond to oral diflucan. Has refill waiting at pharmacy which he has not yet picked up  Follow up chronic back pain He continues to have daily pain in his mid and lower back. Pain has been increasingly persistent. Takes at least one oxycodone nearly everyday, and often requires a second tablet. No adverse effects from oxycodone  Abdominal bulge He has been concerned about bulge in upper abdomen for 3-4 months. He mainly notices it when going from lying to sitting position. Is not at all painful.  Frequent bowel movements.  He states he has always had frequent BM, usually after eating.  However since he had cholecystomy stool have been consistently loose and watery, and urgency to have BM after eating has worsened.   Allergies  Allergen Reactions  . Morphine    Current Meds  Medication Sig  . cyclobenzaprine (FLEXERIL) 5 MG tablet Take 1 tablet by mouth 3 (three) times daily as needed. Reported on 10/20/2015  . escitalopram (LEXAPRO) 10 MG tablet TAKE 1 TABLET (10 MG TOTAL) BY MOUTH DAILY. PATIENT NEEDS TO SCHEDULE OFFICE VISIT FOR FOLLOW UP  . esomeprazole (NEXIUM) 40 MG capsule TAKE 2 CAPSULES BY MOUTH DAILY  . glucose blood (ONETOUCH VERIO) test strip 1 strip daily. Reported on 10/20/2015  . ketoconazole (NIZORAL) 2 % cream Apply to affected area once daily for 7 days as needed  . meloxicam (MOBIC) 15 MG tablet Take 1 tablet (15 mg total)  by mouth daily. Reported on 10/20/2015  . metFORMIN (GLUCOPHAGE-XR) 500 MG 24 hr tablet TAKE 2 TABLETS (1,000 MG TOTAL) BY MOUTH EVERY EVENING. WITH MEALS  . oxyCODONE-acetaminophen (PERCOCET) 7.5-325 MG tablet Take 1 tablet by mouth every 4 (four) hours as needed for severe pain.    Review of Systems  Constitutional: Negative for appetite change, chills and fever.  Respiratory: Negative for chest tightness, shortness of breath and wheezing.   Cardiovascular: Negative for chest pain and palpitations.  Gastrointestinal: Positive for  abdominal distention (buldge in abdomen when sitting). Negative for abdominal pain, nausea and vomiting.    Social History  Substance Use Topics  . Smoking status: Former Smoker    Years: 5.00    Quit date: 06/17/1997  . Smokeless tobacco: Never Used  . Alcohol use 0.0 oz/week     Comment: occasional alcohol use   Objective:   BP 120/76 (BP Location: Left Arm, Patient Position: Sitting, Cuff Size: Large)   Pulse 98   Temp 98.1 F (36.7 C) (Oral)   Resp 16   Wt (!) 382 lb (173.3 kg)   SpO2 96% Comment: room air  BMI 50.40 kg/m   Physical Exam  General Appearance:    Alert, cooperative, no distress, morbidly obese  Eyes:    PERRL, conjunctiva/corneas clear, EOM's intact       Lungs:     Clear to auscultation bilaterally, respirations unlabored  Heart:    Regular rate and rhythm  Neurologic:   Awake, alert, oriented x 3. No apparent focal neurological           defect.   Abd:   Mild diastasis recti noted. No abdominal tenderness or other masses.      Results for orders placed or performed in visit on 02/14/16  POCT HgB A1C  Result Value Ref Range   Hemoglobin A1C 9.2    Est. average glucose Bld gHb Est-mCnc 217         Assessment & Plan:     1. Type 2 diabetes mellitus without complication, without long-term current use of insulin (HCC) Uncontrolled. Will add GLP1 agonist. May benefit from weight loss effects of high dose liraglutide. Given samples of Victoza to start titration and will try to get Saxenda approved to take when finished with samples of Saxenda.  - POCT HgB A1C - POCT UA - Microalbumin - Liraglutide (VICTOZA) 18 MG/3ML SOPN; 0.6mg  once a day for 7 days, then 1.2mg  once a day for 7 days, then 1.8mg  once a day  Dispense: 12 mL; Refill: 0 - Liraglutide -Weight Management (SAXENDA) 18 MG/3ML SOPN; Inject 3 mg into the skin daily.  Dispense: 15 mL; Refill: 3  2. Depression Doing well with current dose of Lexapro  3. Urethritis Recurrant yeast infection,  responding to diflucan. Counseled that glycemic control is vital to reducing frequency of these infections.   4. Rectus diastasis Not at all painful. Counseled that this is benign condition that only requires surgical intervention if it causes pain or discomfort.  5. Obesity Try: - Liraglutide -Weight Management (SAXENDA) 18 MG/3ML SOPN; Inject 3 mg into the skin daily.  Dispense: 15 mL; Refill: 3  Also briefly discussed option of weight loss surgery.   6. Post-cholecystectomy syndrome Try: - cholestyramine (QUESTRAN) 4 g packet; Take 1 packet (4 g total) by mouth 3 (three) times daily with meals. As needed for diarrhea  Dispense: 60 each; Refill: 12  7. Low back pain, unspecified back pain laterality, with sciatica presence unspecified  Stable on current dose of oxycodone. He is having to take 2 in a day more frequently so quantity was increased to 60. Advised that weight loss would likely help with pain.      Addressed extensive list of chronic and acute medical problems today requiring extensive time in counseling and coordination care.  Over half of this 45 minute visit were spent in counseling and coordinating care of multiple medical problems.   The entirety of the information documented in the History of Present Illness, Review of Systems and Physical Exam were personally obtained by me. Portions of this information were initially documented by Meyer Cory, CMA and reviewed by me for thoroughness and accuracy.   [name], MD  [facility]

## 2016-02-15 ENCOUNTER — Telehealth: Payer: Self-pay | Admitting: Family Medicine

## 2016-02-15 NOTE — Telephone Encounter (Signed)
Pt called saying pharmacy should be sending a request to change the diabetes medication that was prescribed yesterday.  Pharmacy said insurance will  Not cover .  [facility] [place]  Pt's call back is [phone]  Thanks Con Memos

## 2016-02-16 NOTE — Telephone Encounter (Signed)
Fax received

## 2016-02-27 ENCOUNTER — Other Ambulatory Visit: Payer: Self-pay | Admitting: Family Medicine

## 2016-02-27 DIAGNOSIS — E119 Type 2 diabetes mellitus without complications: Secondary | ICD-10-CM

## 2016-02-27 NOTE — Telephone Encounter (Signed)
Please advise patient that insurance dose not cover Saxenda, but they should cover Victoza. If he is tolerating samples, then send in rx for Victoza to his pharmacy. Thanks.

## 2016-03-06 ENCOUNTER — Other Ambulatory Visit: Payer: Self-pay | Admitting: Family Medicine

## 2016-03-06 DIAGNOSIS — F329 Major depressive disorder, single episode, unspecified: Secondary | ICD-10-CM

## 2016-03-06 DIAGNOSIS — F32A Depression, unspecified: Secondary | ICD-10-CM

## 2016-03-06 DIAGNOSIS — F410 Panic disorder [episodic paroxysmal anxiety] without agoraphobia: Secondary | ICD-10-CM

## 2016-03-06 MED ORDER — DULAGLUTIDE 1.5 MG/0.5ML ~~LOC~~ SOAJ: 1.5000 mg | SUBCUTANEOUS | 4 refills | Status: DC

## 2016-03-06 MED ORDER — LIRAGLUTIDE 18 MG/3ML ~~LOC~~ SOPN: 1.8000 mg | PEN_INJECTOR | Freq: Every day | SUBCUTANEOUS | 5 refills | Status: DC

## 2016-03-06 NOTE — Progress Notes (Signed)
Please advise patient insurance does not cover Victoza, but does cover Trulicity which works the same way, but is only once a week. Have sent rx for Trulicity to his pharmacy.

## 2016-03-06 NOTE — Telephone Encounter (Signed)
Patient stated that he is tolerating victoza well and okay to send in [facility] to [facility] [place].

## 2016-03-07 NOTE — Progress Notes (Signed)
Left message to call back  

## 2016-03-07 NOTE — Progress Notes (Signed)
Advised patient as below.  

## 2016-03-17 ENCOUNTER — Other Ambulatory Visit: Payer: Self-pay | Admitting: Physician Assistant

## 2016-04-02 ENCOUNTER — Other Ambulatory Visit: Payer: Self-pay | Admitting: Family Medicine

## 2016-04-02 NOTE — Telephone Encounter (Signed)
Pt contacted office for refill request on the following medications: ° °oxyCODONE-acetaminophen (PERCOCET) 7.5-325 MG tablet  ° °CB#[phone]/MW °

## 2016-04-03 MED ORDER — OXYCODONE-ACETAMINOPHEN 7.5-325 MG PO TABS: 1.0000 | ORAL_TABLET | ORAL | 0 refills | Status: DC | PRN

## 2016-04-10 ENCOUNTER — Telehealth: Payer: Self-pay | Admitting: Family Medicine

## 2016-04-10 MED ORDER — FLUCONAZOLE 150 MG PO TABS: 150.0000 mg | ORAL_TABLET | Freq: Once | ORAL | 0 refills | Status: AC

## 2016-04-10 NOTE — Telephone Encounter (Signed)
I spoke with Dr. Rosanna Randy regarding patients symptoms. Per Dr. Rosanna Randy, ok to send in 1 pill of Diflucan, but patient should still be seen by Dr. Caryn Section in the morning. Patient advised that prescription has been sent into pharmacy. Patient was also advised that he should go to the ER if he develops any more swelling or is unable to urinate. Patient verbally voiced understanding. Can we work patient in to be seen tomorrow (Thursday).

## 2016-04-10 NOTE — Telephone Encounter (Signed)
Called patient to find out more information. Need to know why he needs this medication. Is he having symptoms?

## 2016-04-10 NOTE — Telephone Encounter (Signed)
Patient called back stating he has irritation, itching and white colored substance near his groin. He states he believes this to be a yeast infection. This is the 4th episode he has had in the past few months.  Patient says the irritation has occurred intermittently since May when Dr. Caryn Section last prescribed Diflucan. Patient mentioned that today he started having some penile swelling. He states he is able to urinate. Patient denies any fever, chills or body aches.

## 2016-04-10 NOTE — Telephone Encounter (Signed)
Pt needs refill on diflucan, single pill.  [facility]  His call back is [phone]  Thanks Con Memos

## 2016-04-29 ENCOUNTER — Telehealth: Payer: Self-pay | Admitting: Family Medicine

## 2016-04-29 MED ORDER — OXYCODONE-ACETAMINOPHEN 7.5-325 MG PO TABS: 1.0000 | ORAL_TABLET | ORAL | 0 refills | Status: DC | PRN

## 2016-04-29 NOTE — Telephone Encounter (Signed)
Please review. KW 

## 2016-04-29 NOTE — Telephone Encounter (Signed)
Pt needs refill on his oxycodone.  Please call when ready for pick up.[phone]  Thanks Con Memos

## 2016-05-07 ENCOUNTER — Telehealth: Payer: Self-pay | Admitting: Family Medicine

## 2016-05-07 MED ORDER — FLUCONAZOLE 150 MG PO TABS: 150.0000 mg | ORAL_TABLET | Freq: Once | ORAL | 5 refills | Status: DC

## 2016-05-07 NOTE — Telephone Encounter (Signed)
Pt needs refill on his diflucan single pill.   [facility] uses [facility]  Thank sTeri

## 2016-05-07 NOTE — Telephone Encounter (Signed)
Patient reports that he often get yeast infections near his genitals and has been this way intermittently over the past several months. Patient reports that he has been using ketoconazole cream but it is not helping and states Diflucan has helped him before in the past. Please advise. KW

## 2016-05-14 ENCOUNTER — Ambulatory Visit: Payer: BLUE CROSS/BLUE SHIELD | Admitting: Family Medicine

## 2016-05-15 ENCOUNTER — Ambulatory Visit: Payer: BLUE CROSS/BLUE SHIELD | Admitting: Family Medicine

## 2016-05-20 ENCOUNTER — Ambulatory Visit (INDEPENDENT_AMBULATORY_CARE_PROVIDER_SITE_OTHER): Payer: BLUE CROSS/BLUE SHIELD | Admitting: Family Medicine

## 2016-05-20 ENCOUNTER — Encounter: Payer: Self-pay | Admitting: Family Medicine

## 2016-05-20 VITALS — BP 120/70 | HR 80 | Temp 98.4°F | Resp 16 | Ht 73.0 in | Wt 381.0 lb

## 2016-05-20 DIAGNOSIS — E119 Type 2 diabetes mellitus without complications: Secondary | ICD-10-CM

## 2016-05-20 LAB — POCT GLYCOSYLATED HEMOGLOBIN (HGB A1C)
Est. average glucose Bld gHb Est-mCnc: 220
Hemoglobin A1C: 9.3

## 2016-05-20 MED ORDER — LIRAGLUTIDE 18 MG/3ML ~~LOC~~ SOPN: PEN_INJECTOR | SUBCUTANEOUS | 6 refills | Status: DC

## 2016-05-20 MED ORDER — OXYCODONE-ACETAMINOPHEN 7.5-325 MG PO TABS: 1.0000 | ORAL_TABLET | ORAL | 0 refills | Status: DC | PRN

## 2016-05-20 MED ORDER — FLUCONAZOLE 150 MG PO TABS: 150.0000 mg | ORAL_TABLET | Freq: Once | ORAL | 5 refills | Status: AC

## 2016-05-20 NOTE — Patient Instructions (Addendum)
   Go to [email or web address] to registration for prescription copay discount for Victoza.

## 2016-05-20 NOTE — Progress Notes (Signed)
Patient: Raymond Rose Male    DOB: 1976-03-10   40 y.o.   MRN: [id number] Visit Date: 05/20/2016  Today's Provider: [name], MD   Chief Complaint  Patient presents with  . Follow-up  . Diabetes  . Depression   Subjective:    HPI     Diabetes Mellitus Type II, Follow-up:   Lab Results  Component Value Date   HGBA1C 9.2 02/14/2016   HGBA1C 7.9 06/22/2015   HGBA1C 7.8 (H) 01/03/2015   Last seen for diabetes 4 months ago.  Management since then includes; added Victoza . He reports good compliance with treatment. He is not having side effects. none Current symptoms include none and have been unchanged. Home blood sugar records: fasting range: not checking  Episodes of hypoglycemia? no   Current Insulin Regimen: n/a Most Recent Eye Exam: 09/06/15 Weight trend: stable Prior visit with dietician: no Current diet: in general, an "unhealthy" diet Current exercise: golf  ----------------------------------------------------------------  Chronic back pain States that oxycodone APAP remains effective. Pain does not radiate. He usually takes medication 2-3 times a week.  Continues to have frequent yeast infections on skin which he states responds well to diflucan.  Allergies  Allergen Reactions  . Morphine      Current Outpatient Prescriptions:  .  cholestyramine (QUESTRAN) 4 g packet, Take 1 packet (4 g total) by mouth 3 (three) times daily with meals. As needed for diarrhea, Disp: 60 each, Rfl: 12 .  cyclobenzaprine (FLEXERIL) 5 MG tablet, Take 1 tablet by mouth 3 (three) times daily as needed. Reported on 10/20/2015, Disp: , Rfl:  .  escitalopram (LEXAPRO) 10 MG tablet, Take 1 tablet (10 mg total) by mouth daily., Disp: 30 tablet, Rfl: 6 .  esomeprazole (NEXIUM) 40 MG capsule, TAKE 2 CAPSULES BY MOUTH DAILY, Disp: 60 capsule, Rfl: 3 .  glucose blood (ONETOUCH VERIO) test strip, 1 strip daily. Reported on 10/20/2015, Disp: , Rfl:  .  ketoconazole  (NIZORAL) 2 % cream, Apply to affected area once daily for 7 days as needed, Disp: 15 g, Rfl: 1 .  meloxicam (MOBIC) 15 MG tablet, Take 1 tablet (15 mg total) by mouth daily. Reported on 10/20/2015, Disp: 30 tablet, Rfl: 3 .  metFORMIN (GLUCOPHAGE-XR) 500 MG 24 hr tablet, TAKE 2 TABLETS (1,000 MG TOTAL) BY MOUTH EVERY EVENING. WITH MEALS, Disp: 60 tablet, Rfl: 5 .  oxyCODONE-acetaminophen (PERCOCET) 7.5-325 MG tablet, Take 1 tablet by mouth every 4 (four) hours as needed for severe pain., Disp: 60 tablet, Rfl: 0  Review of Systems  Constitutional: Negative for appetite change, chills and fever.  Respiratory: Negative for chest tightness, shortness of breath and wheezing.   Cardiovascular: Negative for chest pain and palpitations.  Gastrointestinal: Negative for abdominal pain, nausea and vomiting.    Social History  Substance Use Topics  . Smoking status: Former Smoker    Years: 5.00    Quit date: 06/17/1997  . Smokeless tobacco: Never Used  . Alcohol use 0.0 oz/week     Comment: occasional alcohol use   Objective:   BP 120/70 (BP Location: Right Arm, Patient Position: Sitting, Cuff Size: Large)   Pulse 80   Temp 98.4 F (36.9 C) (Oral)   Resp 16   Ht 6\' 1"  (1.854 m)   Wt (!) 381 lb (172.8 kg)   BMI 50.27 kg/m   Physical Exam  General appearance: alert, well developed, well nourished, cooperative and in no distress, morbidly obese.  Head:  Normocephalic, without obvious abnormality, atraumatic Respiratory: Respirations even and unlabored, normal respiratory rate Extremities: No gross deformities Skin: Skin color, texture, turgor normal. No rashes seen  Psych: Appropriate mood and affect. Neurologic: Mental status: Alert, oriented to person, place, and time, thought content appropriate.  Results for orders placed or performed in visit on 02/14/16  POCT HgB A1C  Result Value Ref Range   Hemoglobin A1C 9.2    Est. average glucose Bld gHb Est-mCnc 217   POCT UA - Microalbumin    Result Value Ref Range   Microalbumin Ur, POC 50 mg/L   Creatinine, POC n/a mg/dL   Albumin/Creatinine Ratio, Urine, POC n/a        Assessment & Plan:     1. Type 2 diabetes mellitus without complication, without long-term current use of insulin ([place]) Has not yet started GLP1 agonist due to cost. Recommended he go to [employer] for copay coupon. Given new prescription for medication, to follow up in 3 months.  - POCT glycosylated hemoglobin (Hb A1C)       [name], MD  [facility]

## 2016-05-22 ENCOUNTER — Other Ambulatory Visit: Payer: Self-pay | Admitting: Family Medicine

## 2016-05-22 MED ORDER — DULAGLUTIDE 1.5 MG/0.5ML ~~LOC~~ SOAJ: 1.5000 mg | SUBCUTANEOUS | 4 refills | Status: DC

## 2016-05-22 NOTE — Telephone Encounter (Signed)
Pt states the Rx liraglutide 18 MG/3ML SOPN was called in but his insurance will not cover this.  Pt states the insurance will cover Trulicity.  Pt is requesting [facility] [place].  CB#[phone]/MW

## 2016-05-22 NOTE — Telephone Encounter (Signed)
Please review. Thanks!  

## 2016-05-22 NOTE — Telephone Encounter (Signed)
[facility] [place]. He should go to [employer] site to apply for copay discount card as copay will probably  still be pretty high.

## 2016-05-23 ENCOUNTER — Ambulatory Visit (INDEPENDENT_AMBULATORY_CARE_PROVIDER_SITE_OTHER): Payer: BLUE CROSS/BLUE SHIELD | Admitting: Family Medicine

## 2016-05-23 ENCOUNTER — Encounter: Payer: Self-pay | Admitting: Family Medicine

## 2016-05-23 VITALS — BP 118/82 | HR 72 | Temp 97.7°F | Resp 16 | Ht 72.0 in | Wt 380.2 lb

## 2016-05-23 DIAGNOSIS — G4733 Obstructive sleep apnea (adult) (pediatric): Secondary | ICD-10-CM

## 2016-05-23 DIAGNOSIS — Z23 Encounter for immunization: Secondary | ICD-10-CM

## 2016-05-23 DIAGNOSIS — E119 Type 2 diabetes mellitus without complications: Secondary | ICD-10-CM | POA: Diagnosis not present

## 2016-05-23 DIAGNOSIS — K219 Gastro-esophageal reflux disease without esophagitis: Secondary | ICD-10-CM | POA: Diagnosis not present

## 2016-05-23 DIAGNOSIS — E7800 Pure hypercholesterolemia, unspecified: Secondary | ICD-10-CM

## 2016-05-23 DIAGNOSIS — Z0000 Encounter for general adult medical examination without abnormal findings: Secondary | ICD-10-CM | POA: Diagnosis not present

## 2016-05-23 NOTE — Progress Notes (Signed)
Subjective:     Patient ID: Raymond Rose, male   DOB: 07-Jan-1976, 40 y.o.   MRN: [id number]  HPI  Chief Complaint  Patient presents with  . Employment Physical    Patient comes in office today for annual physical, he states that he has no questions or concerns to address today. Patient reports that he is eatng well and exercising 3x a week consistent with walking on tredmill at home and golfing on weekends. Patient reports an average of 7.5-8hrs of sleep at night, he states that he has a history of sleep apnea but uses Cpap machine. Patient last reported flu vaccine was 03/30/16, he is due today for screening labs and Tdap vaccine.   Has a form to fill out confirming completion of physical exam/screening.   Review of Systems General: Feeling well HEENT: regular dental visits and eye exams (glasses) Cardiovascular: no chest pain, shortness of breath, or palpitations GI: Heartburn controlled by PPI-discussed using episodically rather than daily. No change in bowel habits or blood in the stool GU: nocturia x 2-3 no change in bladder habits  Psychiatric: not depressed Musculoskeletal: no joint pain    Objective:   Physical Exam  Constitutional: He appears well-developed and well-nourished. No distress.  Eyes: PERRLA, Neck: no thyromegaly, tenderness or nodules, no cervical adenopathy ENT: tonsils absent  Lungs: Clear Heart : RRR without murmur or gallop Abd: bowel sounds present, soft, non-tender, no organomegaly Extremities: no edema,  Skin: no atypical lesions noted on his back with multiple 0.5 cm nevi.     Assessment:    1. Need for tetanus booster - Tdap vaccine greater than or equal to 7yo IM  2. Annual physical exam  3. Gastroesophageal reflux disease without esophagitis: controlled by Nexium  4. Obstructive sleep apnea: C-Pap  5. Type 2 diabetes mellitus without complication, without long-term current use of insulin [facility]): Per primary M.D. - Comprehensive metabolic  panel  6. Hypercholesteremia - Lipid panel    Plan:    Form completed. F/u with his primary M.D., Dr. Caryn Section, as scheduled.

## 2016-05-23 NOTE — Telephone Encounter (Signed)
Patient was notified.

## 2016-05-23 NOTE — Telephone Encounter (Signed)
LMOVM for pt to return call 

## 2016-05-23 NOTE — Patient Instructions (Signed)
Encourage increasing walking/treadmill to 30 minutes daily. We will call you with the lab results.

## 2016-05-24 ENCOUNTER — Telehealth: Payer: Self-pay

## 2016-05-24 LAB — LIPID PANEL
Chol/HDL Ratio: 7.4 ratio units — ABNORMAL HIGH (ref 0.0–5.0)
Cholesterol, Total: 222 mg/dL — ABNORMAL HIGH (ref 100–199)
HDL: 30 mg/dL — ABNORMAL LOW (ref >39)
LDL Calculated: 118 mg/dL — ABNORMAL HIGH (ref 0–99)
Triglycerides: 370 mg/dL — ABNORMAL HIGH (ref 0–149)
VLDL Cholesterol Cal: 74 mg/dL — ABNORMAL HIGH (ref 5–40)

## 2016-05-24 LAB — COMPREHENSIVE METABOLIC PANEL
ALT: 45 IU/L — ABNORMAL HIGH (ref 0–44)
AST: 56 IU/L — ABNORMAL HIGH (ref 0–40)
Albumin/Globulin Ratio: 1.4 (ref 1.2–2.2)
Albumin: 4.5 g/dL (ref 3.5–5.5)
Alkaline Phosphatase: 92 IU/L (ref 39–117)
BUN/Creatinine Ratio: 17 (ref 9–20)
BUN: 14 mg/dL (ref 6–20)
Bilirubin Total: 0.6 mg/dL (ref 0.0–1.2)
CO2: 25 mmol/L (ref 18–29)
Calcium: 9.5 mg/dL (ref 8.7–10.2)
Chloride: 95 mmol/L — ABNORMAL LOW (ref 96–106)
Creatinine, Ser: 0.82 mg/dL (ref 0.76–1.27)
GFR calc Af Amer: 131 mL/min/{1.73_m2} (ref >59)
GFR calc non Af Amer: 113 mL/min/{1.73_m2} (ref >59)
Globulin, Total: 3.2 g/dL (ref 1.5–4.5)
Glucose: 267 mg/dL — ABNORMAL HIGH (ref 65–99)
Potassium: 4.5 mmol/L (ref 3.5–5.2)
Sodium: 137 mmol/L (ref 134–144)
Total Protein: 7.7 g/dL (ref 6.0–8.5)

## 2016-05-24 NOTE — Telephone Encounter (Signed)
Patient has been advised. KW 

## 2016-05-24 NOTE — Telephone Encounter (Signed)
-----   Message from Carmon Ginsberg, [place] sent at 05/24/2016  7:45 AM EST ----- Sugar is elevated with mild elevations of cholesterol and liver tests. I discussed these labs with Dr. Abigail Miyamoto will talk to you about treatment options at your next office visit. In the meantime do increase exercise to 5 days/week up to 30 minutes.

## 2016-06-06 ENCOUNTER — Other Ambulatory Visit: Payer: Self-pay | Admitting: Family Medicine

## 2016-06-22 ENCOUNTER — Other Ambulatory Visit: Payer: Self-pay | Admitting: Family Medicine

## 2016-06-25 ENCOUNTER — Other Ambulatory Visit: Payer: Self-pay | Admitting: Family Medicine

## 2016-06-25 MED ORDER — OXYCODONE-ACETAMINOPHEN 7.5-325 MG PO TABS: 1.0000 | ORAL_TABLET | ORAL | 0 refills | Status: DC | PRN

## 2016-06-25 NOTE — Telephone Encounter (Signed)
Pt needs refill   oxyCODONE-acetaminophen (PERCOCET) 7.5-325 MG tablet  Thanks, Con Memos

## 2016-07-15 ENCOUNTER — Telehealth: Payer: Self-pay | Admitting: Family Medicine

## 2016-07-15 MED ORDER — OMEPRAZOLE 40 MG PO CPDR: 40.0000 mg | DELAYED_RELEASE_CAPSULE | Freq: Every day | ORAL | 3 refills | Status: DC

## 2016-07-15 NOTE — Telephone Encounter (Signed)
Please review. Raymond Rose, CMA  

## 2016-07-15 NOTE — Telephone Encounter (Signed)
Pt states his insurance [employer] will no longer cover the Rx esomeprazole (NEXIUM) 40 MG capsule.  Pt is asking if this can be switch to a generic that is more affordable.  [facility] [place]  CB#[phone]/MW

## 2016-07-25 ENCOUNTER — Ambulatory Visit (INDEPENDENT_AMBULATORY_CARE_PROVIDER_SITE_OTHER): Payer: BLUE CROSS/BLUE SHIELD | Admitting: Physician Assistant

## 2016-07-25 ENCOUNTER — Encounter: Payer: Self-pay | Admitting: Physician Assistant

## 2016-07-25 VITALS — BP 136/88 | HR 88 | Temp 99.0°F | Resp 16 | Wt 371.0 lb

## 2016-07-25 DIAGNOSIS — R509 Fever, unspecified: Secondary | ICD-10-CM

## 2016-07-25 DIAGNOSIS — B9789 Other viral agents as the cause of diseases classified elsewhere: Secondary | ICD-10-CM | POA: Diagnosis not present

## 2016-07-25 DIAGNOSIS — R059 Cough, unspecified: Secondary | ICD-10-CM

## 2016-07-25 DIAGNOSIS — J069 Acute upper respiratory infection, unspecified: Secondary | ICD-10-CM

## 2016-07-25 DIAGNOSIS — R05 Cough: Secondary | ICD-10-CM

## 2016-07-25 LAB — POCT INFLUENZA A/B
Influenza A, POC: NEGATIVE
Influenza B, POC: NEGATIVE

## 2016-07-25 NOTE — Patient Instructions (Signed)
Upper Respiratory Infection, Adult Most upper respiratory infections (URIs) are caused by a virus. A URI affects the nose, throat, and upper air passages. The most common type of URI is often called "the common cold." Follow these instructions at home:  Take medicines only as told by your doctor.  Gargle warm saltwater or take cough drops to comfort your throat as told by your doctor.  Use a warm mist humidifier or inhale steam from a shower to increase air moisture. This may make it easier to breathe.  Drink enough fluid to keep your pee (urine) clear or pale yellow.  Eat soups and other clear broths.  Have a healthy diet.  Rest as needed.  Go back to work when your fever is gone or your doctor says it is okay.  You may need to stay home longer to avoid giving your URI to others.  You can also wear a face mask and wash your hands often to prevent spread of the virus.  Use your inhaler more if you have asthma.  Do not use any tobacco products, including cigarettes, chewing tobacco, or electronic cigarettes. If you need help quitting, ask your doctor. Contact a doctor if:  You are getting worse, not better.  Your symptoms are not helped by medicine.  You have chills.  You are getting more short of breath.  You have brown or red mucus.  You have yellow or brown discharge from your nose.  You have pain in your face, especially when you bend forward.  You have a fever.  You have puffy (swollen) neck glands.  You have pain while swallowing.  You have white areas in the back of your throat. Get help right away if:  You have very bad or constant:  Headache.  Ear pain.  Pain in your forehead, behind your eyes, and over your cheekbones (sinus pain).  Chest pain.  You have long-lasting (chronic) lung disease and any of the following:  Wheezing.  Long-lasting cough.  Coughing up blood.  A change in your usual mucus.  You have a stiff neck.  You have  changes in your:  Vision.  Hearing.  Thinking.  Mood. This information is not intended to replace advice given to you by your health care provider. Make sure you discuss any questions you have with your health care provider. Document Released: 11/20/2007 Document Revised: 02/04/2016 Document Reviewed: 09/08/2013 Elsevier Interactive Patient Education  2017 [employer]  

## 2016-07-25 NOTE — Progress Notes (Signed)
[place]  Chief Complaint  Patient presents with  . URI    Started about three days ago.     Subjective:    Patient ID: Raymond Rose, male    DOB: [birth year], 41 y.o.   MRN: [id number]  Upper Respiratory Infection: Raymond Rose is a 41 y.o. male with a past medical history significant for Diabetes complaining of symptoms of a URI. Symptoms include congestion, cough, fever and sore throat. Onset of symptoms was 3 days ago, unchanged since that time. He also c/o achiness, cough described as productive, low grade fever and nasal congestion for the past 3 days .  He is drinking plenty of fluids. Evaluation to date: none. Treatment to date: cough suppressants and decongestants. The treatment has provided minimal.   Review of Systems  Constitutional: Positive for chills, diaphoresis, fatigue and fever. Negative for activity change, appetite change and unexpected weight change.  HENT: Positive for congestion, postnasal drip, rhinorrhea, sinus pain, sinus pressure and sore throat. Negative for ear discharge, ear pain, nosebleeds, sneezing, tinnitus, trouble swallowing and voice change.   Eyes: Negative.   Respiratory: Positive for cough, chest tightness, shortness of breath and wheezing. Negative for apnea, choking and stridor.   Gastrointestinal: Negative.   Musculoskeletal: Positive for arthralgias and myalgias.  Neurological: Positive for headaches. Negative for dizziness and light-headedness.       Objective:   BP 136/88 (BP Location: Left Wrist, Patient Position: Sitting, Cuff Size: Normal)   Pulse 88   Temp 99 F (37.2 C) (Oral)   Resp 16   Wt (!) 371 lb (168.3 kg)   BMI 50.32 kg/m   Patient Active Problem List   Diagnosis Date Noted  . Rectus diastasis 02/14/2016  . 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 Panic attacks 06/22/2015  . Depression 06/22/2015  . Diabetes mellitus ([place]) 01/03/2015  . Eating disorder 01/03/2015   . GERD (gastroesophageal reflux disease) 01/03/2015  . Hearing loss 01/03/2015  . Leg cramps 01/03/2015  . LBP (low back pain) 01/03/2015  . Adrenal mass, right ([place]) 06/18/2012  . Obstructive sleep apnea 07/08/2010  . Cluster headache 12/01/2008  . Fam hx-ischem heart disease 10/03/2008  . Morbid obesity ([place]) 09/30/2008  . Hypercholesteremia 12/12/2004    Outpatient Encounter Prescriptions as of 07/25/2016  Medication Sig Note  . cyclobenzaprine (FLEXERIL) 5 MG tablet Take 1 tablet by mouth 3 (three) times daily as needed. Reported on 10/20/2015 01/03/2015: Medication taken as needed.  Received from: [place]: Take by mouth.  . escitalopram (LEXAPRO) 10 MG tablet Take 1 tablet (10 mg total) by mouth daily.   Marland Kitchen esomeprazole (NEXIUM) 40 MG capsule TAKE 2 CAPSULES BY MOUTH DAILY   . glucose blood (ONETOUCH VERIO) test strip 1 strip daily. Reported on 10/20/2015 01/03/2015: Received from: [place]: Roma Schanz (In Vitro Strip)  1 (one) Strip Strip daily for 0 days  Quantity: 100;  Refills: 3   Ordered :30-Jun-2013  [name] MD;  Started 30-Jun-2013 Active  . metFORMIN (GLUCOPHAGE-XR) 500 MG 24 hr tablet TAKE 2 TABLETS (1,000 MG TOTAL) BY MOUTH EVERY EVENING. WITH MEALS   . [DISCONTINUED] Dulaglutide (TRULICITY) 1.5 0000000 SOPN Inject 1.5 mg into the skin once a week.   . cholestyramine (QUESTRAN) 4 g packet Take 1 packet (4 g total) by mouth 3 (three) times daily with meals. As needed for diarrhea (Patient not taking: Reported on 07/25/2016)   . ketoconazole (NIZORAL) 2 % cream  Apply to affected area once daily for 7 days as needed (Patient not taking: Reported on 07/25/2016)   . omeprazole (PRILOSEC) 40 MG capsule Take 1 capsule (40 mg total) by mouth daily. (Patient not taking: Reported on 07/25/2016)   . oxyCODONE-acetaminophen (PERCOCET) 7.5-325 MG tablet Take 1 tablet by mouth every 4 (four) hours as needed for severe pain.  (Patient not taking: Reported on 07/25/2016)   . [DISCONTINUED] liraglutide 18 MG/3ML SOPN Inject 0.6mg  daily for one week, then 1.2mg  daily for one week, then 1.8mg  daily (Patient not taking: Reported on 05/23/2016)   . [DISCONTINUED] meloxicam (MOBIC) 15 MG tablet Take 1 tablet (15 mg total) by mouth daily. Reported on 10/20/2015   . [DISCONTINUED] predniSONE (DELTASONE) 10 MG tablet 6 tablets for 2 days, then 5 for 2 days, then 4 for 2 days, then 3 for 2 days, then 2 for 2 days, then 1 for 2 days.    No facility-administered encounter medications on file as of 07/25/2016.     Allergies  Allergen Reactions  . Morphine        Physical Exam  Constitutional: He is oriented to person, place, and time. He appears well-developed and well-nourished.  HENT:  Right Ear: Tympanic membrane normal.  Left Ear: Tympanic membrane normal.  Nose: Rhinorrhea present. Right sinus exhibits no maxillary sinus tenderness and no frontal sinus tenderness. Left sinus exhibits no maxillary sinus tenderness and no frontal sinus tenderness.  Mouth/Throat: Oropharynx is clear and moist. No oropharyngeal exudate.  Eyes: Right eye exhibits discharge. Left eye exhibits discharge.  Clear discharge  Neck: Neck supple.  Cardiovascular: Normal rate, regular rhythm and normal heart sounds.   Pulmonary/Chest: Effort normal and breath sounds normal. No respiratory distress. He has no wheezes. He has no rales.  Lymphadenopathy:    He has no cervical adenopathy.  Neurological: He is alert and oriented to person, place, and time.  Skin: Skin is warm and dry.  Psychiatric: He has a normal mood and affect. His behavior is normal.       Assessment & Plan:   1. Viral URI  Seems to be improving, rapid flu negative. Treat sx w/ OTC cough medicine and pain relievers. Work note provided.  2. Cough  - POCT Influenza A/B  3. Fever, unspecified fever cause  - POCT Influenza A/B   Recommend rest, fluids, frequent hand  washing. Work note provided.  Return if symptoms worsen or fail to improve.   Patient Instructions  Upper Respiratory Infection, Adult Most upper respiratory infections (URIs) are caused by a virus. A URI affects the nose, throat, and upper air passages. The most common type of URI is often called "the common cold." Follow these instructions at home:  Take medicines only as told by your doctor.  Gargle warm saltwater or take cough drops to comfort your throat as told by your doctor.  Use a warm mist humidifier or inhale steam from a shower to increase air moisture. This may make it easier to breathe.  Drink enough fluid to keep your pee (urine) clear or pale yellow.  Eat soups and other clear broths.  Have a healthy diet.  Rest as needed.  Go back to work when your fever is gone or your doctor says it is okay.  You may need to stay home longer to avoid giving your URI to others.  You can also wear a face mask and wash your hands often to prevent spread of the virus.  Use your inhaler more  if you have asthma.  Do not use any tobacco products, including cigarettes, chewing tobacco, or electronic cigarettes. If you need help quitting, ask your doctor. Contact a doctor if:  You are getting worse, not better.  Your symptoms are not helped by medicine.  You have chills.  You are getting more short of breath.  You have brown or red mucus.  You have yellow or brown discharge from your nose.  You have pain in your face, especially when you bend forward.  You have a fever.  You have puffy (swollen) neck glands.  You have pain while swallowing.  You have white areas in the back of your throat. Get help right away if:  You have very bad or constant:  Headache.  Ear pain.  Pain in your forehead, behind your eyes, and over your cheekbones (sinus pain).  Chest pain.  You have long-lasting (chronic) lung disease and any of the  following:  Wheezing.  Long-lasting cough.  Coughing up blood.  A change in your usual mucus.  You have a stiff neck.  You have changes in your:  Vision.  Hearing.  Thinking.  Mood. This information is not intended to replace advice given to you by your health care provider. Make sure you discuss any questions you have with your health care provider. Document Released: 11/20/2007 Document Revised: 02/04/2016 Document Reviewed: 09/08/2013 Elsevier Interactive Patient Education  2017 [employer].     The entirety of the information documented in the History of Present Illness, Review of Systems and Physical Exam were personally obtained by me. Portions of this information were initially documented by Ashley Royalty, CMA and reviewed by me for thoroughness and accuracy.

## 2016-07-29 ENCOUNTER — Telehealth: Payer: Self-pay | Admitting: Family Medicine

## 2016-07-29 ENCOUNTER — Telehealth: Payer: Self-pay | Admitting: *Deleted

## 2016-07-29 MED ORDER — OXYCODONE-ACETAMINOPHEN 7.5-325 MG PO TABS: 1.0000 | ORAL_TABLET | ORAL | 0 refills | Status: DC | PRN

## 2016-07-29 NOTE — Telephone Encounter (Signed)
Pt needs refill on his   oxyCODONE-acetaminophen (PERCOCET) 7.5-325 MG tablet  Not Taking 06/25/16 -- Birdie Sons, MD    Take 1 tablet by mouth every 4 (four) hours as needed for severe      He also stated he was in Thursday last week for upper resp infection .   He wasn't prescribed anything.  He would like to have something for his cough and if you think maybe an antibiotic.  He is not any better.  [facility].   Thanks [employer]

## 2016-07-29 NOTE — Telephone Encounter (Signed)
Please advise 

## 2016-07-29 NOTE — Telephone Encounter (Signed)
Please review and advise, patient normally has this prescription filled by Dr. Caryn Section. KW

## 2016-07-29 NOTE — Telephone Encounter (Signed)
See refill request.

## 2016-07-29 NOTE — Telephone Encounter (Signed)
Patient stated he was in Thursday last week for upper resp infection. He wasn't prescribed anything.  He would like to have something for his cough and if you think maybe an antibiotic. He is not any better.  [facility].

## 2016-07-30 MED ORDER — BENZONATATE 100 MG PO CAPS: 100.0000 mg | ORAL_CAPSULE | Freq: Two times a day (BID) | ORAL | 0 refills | Status: DC | PRN

## 2016-07-30 MED ORDER — AZITHROMYCIN 250 MG PO TABS: ORAL_TABLET | ORAL | 0 refills | Status: AC

## 2016-07-30 NOTE — Telephone Encounter (Signed)
Patient has been advised. KW 

## 2016-07-30 NOTE — Telephone Encounter (Signed)
Have sent rx for tessalon and zpack to pharmacy.

## 2016-08-27 ENCOUNTER — Ambulatory Visit: Payer: BLUE CROSS/BLUE SHIELD | Admitting: Family Medicine

## 2016-08-28 ENCOUNTER — Encounter: Payer: Self-pay | Admitting: Family Medicine

## 2016-08-28 ENCOUNTER — Ambulatory Visit: Payer: BLUE CROSS/BLUE SHIELD | Admitting: Family Medicine

## 2016-08-28 ENCOUNTER — Ambulatory Visit (INDEPENDENT_AMBULATORY_CARE_PROVIDER_SITE_OTHER): Payer: BLUE CROSS/BLUE SHIELD | Admitting: Family Medicine

## 2016-08-28 ENCOUNTER — Telehealth: Payer: Self-pay | Admitting: Family Medicine

## 2016-08-28 VITALS — BP 112/68 | HR 84 | Temp 98.3°F | Resp 16 | Ht 72.0 in | Wt 375.0 lb

## 2016-08-28 DIAGNOSIS — G8929 Other chronic pain: Secondary | ICD-10-CM | POA: Diagnosis not present

## 2016-08-28 DIAGNOSIS — R7401 Elevation of levels of liver transaminase levels: Secondary | ICD-10-CM | POA: Insufficient documentation

## 2016-08-28 DIAGNOSIS — R740 Nonspecific elevation of levels of transaminase and lactic acid dehydrogenase [LDH]: Secondary | ICD-10-CM

## 2016-08-28 DIAGNOSIS — E119 Type 2 diabetes mellitus without complications: Secondary | ICD-10-CM | POA: Diagnosis not present

## 2016-08-28 DIAGNOSIS — M545 Low back pain: Secondary | ICD-10-CM | POA: Diagnosis not present

## 2016-08-28 LAB — POCT GLYCOSYLATED HEMOGLOBIN (HGB A1C)
Est. average glucose Bld gHb Est-mCnc: 237
Hemoglobin A1C: 9.9

## 2016-08-28 MED ORDER — EMPAGLIFLOZIN 10 MG PO TABS: 10.0000 mg | ORAL_TABLET | Freq: Every day | ORAL | 0 refills | Status: DC

## 2016-08-28 MED ORDER — OXYCODONE-ACETAMINOPHEN 7.5-325 MG PO TABS: 1.0000 | ORAL_TABLET | ORAL | 0 refills | Status: DC | PRN

## 2016-08-28 NOTE — Telephone Encounter (Signed)
Patient advised. Patient is requesting a call back from Dr. Caryn Section. He states he had to reschedule appointments due to work issues.

## 2016-08-28 NOTE — Telephone Encounter (Signed)
Last OV 07/25/2016 Last RF 07/29/2016

## 2016-08-28 NOTE — Progress Notes (Signed)
Patient: Raymond Rose Male    DOB: 1975/12/04   41 y.o.   MRN: [id number] Visit Date: 08/28/2016  Today's Provider: [name], MD   Chief Complaint  Patient presents with  . Diabetes    follow up  . Hyperlipidemia    follow up   Subjective:    HPI  Diabetes Mellitus Type II, Follow-up:   Lab Results  Component Value Date   HGBA1C 9.3 05/20/2016   HGBA1C 9.2 02/14/2016   HGBA1C 7.9 06/22/2015    Last seen for diabetes 3 months ago by Carmon Ginsberg PA-C.  Management since then includes ordering labs; patient was advised to increase exercise and follow up. He reports good compliance with treatment. He is not having side effects. none Current symptoms include none and have been unchanged. Home blood sugar records: fasting range: not checking  Episodes of hypoglycemia? no   Current Insulin Regimen: n/a Most Recent Eye Exam: 1 year ago Weight trend: stable Prior visit with dietician: no Current diet: in general, an "unhealthy" diet Current exercise: walking  Pertinent Labs:    Component Value Date/Time   CHOL 222 (H) 05/23/2016 0950   TRIG 370 (H) 05/23/2016 0950   HDL 30 (L) 05/23/2016 0950   LDLCALC 118 (H) 05/23/2016 0950   CREATININE 0.82 05/23/2016 0950   CREATININE 0.68 09/07/2014 1206   CREATININE 0.68 09/07/2014 1206    Wt Readings from Last 3 Encounters:  08/28/16 (!) 375 lb (170.1 kg)  07/25/16 (!) 371 lb (168.3 kg)  05/23/16 (!) 380 lb 3.2 oz (172.5 kg)    ----------------------------------------------------------------  Lipid/Cholesterol, Follow-up:   Last seen for this 3 months ago by Management changes since that visit include ordering labs and advising patient to increase exercise to 5 times a week up to 30 minutes. . Last Lipid Panel:    Component Value Date/Time   CHOL 222 (H) 05/23/2016 0950   TRIG 370 (H) 05/23/2016 0950   HDL 30 (L) 05/23/2016 0950   CHOLHDL 7.4 (H) 05/23/2016 0950   LDLCALC 118 (H) 05/23/2016  0950    Risk factors for vascular disease include diabetes mellitus He is not checking blood sugars.  He reports good compliance with treatment. He is not having side effects.  Current symptoms include none and have been unchanged. Weight trend: stable Prior visit with dietician: no Current diet: in general, an "unhealthy" diet Current exercise: walking  Wt Readings from Last 3 Encounters:  08/28/16 (!) 375 lb (170.1 kg)  07/25/16 (!) 371 lb (168.3 kg)  05/23/16 (!) 380 lb 3.2 oz (172.5 kg)    ---------------------------------------------------------------- Follow up chronic back pain: Continues to take oxycodone most days, which he finds to be effective and well tolerated.     Allergies  Allergen Reactions  . Morphine      Current Outpatient Prescriptions:  .  escitalopram (LEXAPRO) 10 MG tablet, Take 1 tablet (10 mg total) by mouth daily., Disp: 30 tablet, Rfl: 6 .  esomeprazole (NEXIUM) 40 MG capsule, TAKE 2 CAPSULES BY MOUTH DAILY, Disp: 60 capsule, Rfl: 2 .  glucose blood (ONETOUCH VERIO) test strip, 1 strip daily. Reported on 10/20/2015, Disp: , Rfl:  .  ketoconazole (NIZORAL) 2 % cream, Apply to affected area once daily for 7 days as needed, Disp: 15 g, Rfl: 1 .  metFORMIN (GLUCOPHAGE-XR) 500 MG 24 hr tablet, TAKE 2 TABLETS (1,000 MG TOTAL) BY MOUTH EVERY EVENING. WITH MEALS, Disp: 60 tablet, Rfl: 5 .  oxyCODONE-acetaminophen (  PERCOCET) 7.5-325 MG tablet, Take 1 tablet by mouth every 4 (four) hours as needed for severe pain., Disp: 60 tablet, Rfl: 0 .  cholestyramine (QUESTRAN) 4 g packet, Take 1 packet (4 g total) by mouth 3 (three) times daily with meals. As needed for diarrhea (Patient not taking: Reported on 08/28/2016), Disp: 60 each, Rfl: 12  Review of Systems  Constitutional: Negative for appetite change, chills and fever.  Respiratory: Negative for chest tightness, shortness of breath and wheezing.   Cardiovascular: Negative for chest pain and palpitations.    Gastrointestinal: Negative for abdominal pain, nausea and vomiting.    Social History  Substance Use Topics  . Smoking status: Former Smoker    Years: 5.00    Quit date: 06/17/1997  . Smokeless tobacco: Never Used  . Alcohol use 0.0 oz/week     Comment: occasional alcohol use   Objective:   BP 112/68 (BP Location: Right Arm, Patient Position: Sitting, Cuff Size: Large)   Pulse 84   Temp 98.3 F (36.8 C) (Oral)   Resp 16   Ht 6' (1.829 m)   Wt (!) 375 lb (170.1 kg)   SpO2 97%   BMI 50.86 kg/m  Vitals:   08/28/16 1637  BP: 112/68  Pulse: 84  Resp: 16  Temp: 98.3 F (36.8 C)  TempSrc: Oral  SpO2: 97%  Weight: (!) 375 lb (170.1 kg)  Height: 6' (1.829 m)     Physical Exam  General Appearance:    Alert, cooperative, no distress, obese  Eyes:    PERRL, conjunctiva/corneas clear, EOM's intact       Lungs:     Clear to auscultation bilaterally, respirations unlabored  Heart:    Regular rate and rhythm  Neurologic:   Awake, alert, oriented x 3. No apparent focal neurological           defect.        Results for orders placed or performed in visit on 08/28/16  POCT glycosylated hemoglobin (Hb A1C)  Result Value Ref Range   Hemoglobin A1C 9.9    Est. average glucose Bld gHb Est-mCnc 237        Assessment & Plan:     1. Type 2 diabetes mellitus without complication, without long-term current use of insulin (HCC) Uncontrolled. Long discussion regarding risk of diabetic complications. He was prescribed  Victoza and  Trulicity last year which he tolerated, but did not continue due to cost. Will try samples of Jardiance  - POCT glycosylated hemoglobin (Hb A1C) - empagliflozin (JARDIANCE) 10 MG TABS tablet; Take 10 mg by mouth daily.  Dispense: 35 tablet; Refill: 0  He is to have renal panel done before samples run out in a month.   2. Elevated transaminase level Likely fatty liver. Will recheck after diabetes is better controlled.   3. Chronic low back pain,  unspecified back pain laterality, with sciatica presence unspecified Doing well with prn hydrocodone/apap which was refilled today.   4. Morbid obesity ([place]) Long discussion regarding diet and exercise. He has considered bariatric surgery, but would like to see if diet changes and medications help with weight loss.        [name], MD  [facility]

## 2016-08-28 NOTE — Telephone Encounter (Signed)
Pt needs refill on her   oxyCODONE-acetaminophen (PERCOCET) 7.5-325 MG tablet  Thanks Con [employer]

## 2016-08-28 NOTE — Telephone Encounter (Addendum)
Patient is overdue for follow up of uncontrolled diabetes. Will not refill medications until he is seen for follow up.  (he has appointment in April, but this was the second time he cancelled & rescheduled, so will not refill until he is actually seen in office.)

## 2016-08-28 NOTE — Patient Instructions (Signed)
   Start taking Jardiance, one tablet every morning.    You will need to have labs to check your kidney functions and electrolytes before samples of Jardiance run out.

## 2016-08-29 ENCOUNTER — Telehealth: Payer: Self-pay | Admitting: Family Medicine

## 2016-08-29 NOTE — Telephone Encounter (Signed)
Pt did decide he wants to go ahead with the bariatric referral  He would like to be schd with [facility] DR. Theron Arista.  [phone]  thanks teri

## 2016-09-11 ENCOUNTER — Encounter: Payer: Self-pay | Admitting: Family Medicine

## 2016-09-11 ENCOUNTER — Ambulatory Visit (INDEPENDENT_AMBULATORY_CARE_PROVIDER_SITE_OTHER): Payer: BLUE CROSS/BLUE SHIELD | Admitting: Family Medicine

## 2016-09-11 VITALS — BP 120/82 | HR 112 | Temp 98.8°F | Resp 16 | Wt 343.0 lb

## 2016-09-11 DIAGNOSIS — A084 Viral intestinal infection, unspecified: Secondary | ICD-10-CM

## 2016-09-11 DIAGNOSIS — R000 Tachycardia, unspecified: Secondary | ICD-10-CM | POA: Diagnosis not present

## 2016-09-11 DIAGNOSIS — E119 Type 2 diabetes mellitus without complications: Secondary | ICD-10-CM

## 2016-09-11 MED ORDER — PROMETHAZINE HCL 25 MG PO TABS: 25.0000 mg | ORAL_TABLET | Freq: Three times a day (TID) | ORAL | 0 refills | Status: DC | PRN

## 2016-09-11 NOTE — Progress Notes (Signed)
Patient: Raymond Rose Male    DOB: 11-17-1975   41 y.o.   MRN: [id number] Visit Date: 09/11/2016  Today's Provider: [name], MD   Chief Complaint  Patient presents with  . Nausea   Subjective:    HPI Nausea and Vomiting:  Patient comes in reporting that he started having nausea and Vomiting on 3/25 which last for about 24 hours, then developed persistent diarrhea. He has been keeping liquids down, but has poor appetite. Have watery bowel movement 4-5 times a day. No blood in slool. No fevers. Having generalized abdominal pain.He stopped taking Jardiance and metformin 3 days ago.      Allergies  Allergen Reactions  . Morphine      Current Outpatient Prescriptions:  .  cholestyramine (QUESTRAN) 4 g packet, Take 1 packet (4 g total) by mouth 3 (three) times daily with meals. As needed for diarrhea, Disp: 60 each, Rfl: 12 .  empagliflozin (JARDIANCE) 10 MG TABS tablet, Take 10 mg by mouth daily., Disp: 35 tablet, Rfl: 0 .  escitalopram (LEXAPRO) 10 MG tablet, Take 1 tablet (10 mg total) by mouth daily., Disp: 30 tablet, Rfl: 6 .  esomeprazole (NEXIUM) 40 MG capsule, TAKE 2 CAPSULES BY MOUTH DAILY, Disp: 60 capsule, Rfl: 2 .  glucose blood (ONETOUCH VERIO) test strip, 1 strip daily. Reported on 10/20/2015, Disp: , Rfl:  .  ketoconazole (NIZORAL) 2 % cream, Apply to affected area once daily for 7 days as needed, Disp: 15 g, Rfl: 1 .  metFORMIN (GLUCOPHAGE-XR) 500 MG 24 hr tablet, TAKE 2 TABLETS (1,000 MG TOTAL) BY MOUTH EVERY EVENING. WITH MEALS, Disp: 60 tablet, Rfl: 5 .  oxyCODONE-acetaminophen (PERCOCET) 7.5-325 MG tablet, Take 1 tablet by mouth every 4 (four) hours as needed for severe pain., Disp: 60 tablet, Rfl: 0  Review of Systems  Constitutional: Positive for chills and fatigue. Negative for appetite change, diaphoresis and fever.  Respiratory: Negative for chest tightness, shortness of breath and wheezing.   Cardiovascular: Negative for chest pain and  palpitations.  Gastrointestinal: Positive for abdominal distention, abdominal pain, diarrhea, nausea and vomiting.  Neurological: Positive for dizziness and light-headedness.    Social History  Substance Use Topics  . Smoking status: Former Smoker    Years: 5.00    Quit date: 06/17/1997  . Smokeless tobacco: Never Used     Comment: started smoking at [age]he 16, and quit at age 65  . Alcohol use 0.0 oz/week     Comment: occasional alcohol use   Objective:   BP 120/82 (BP Location: Right Arm, Patient Position: Sitting, Cuff Size: Large)   Pulse (!) 112   Temp 98.8 F (37.1 C) (Oral)   Resp 16   Wt (!) 343 lb (155.6 kg)   SpO2 97% Comment: room air  BMI 46.52 kg/m  Vitals:   09/11/16 1407  BP: 120/82  Pulse: (!) 112  Resp: 16  Temp: 98.8 F (37.1 C)  TempSrc: Oral  SpO2: 97%  Weight: (!) 343 lb (155.6 kg)     Physical Exam  General Appearance:    Alert, cooperative, no distress, obese  Eyes:    PERRL, conjunctiva/corneas clear, EOM's intact       Lungs:     Clear to auscultation bilaterally, respirations unlabored  Abd:   Mild diffuse tenderness, no focal tenderness, no rebound or guarding.   Heart:    Regular rate and rhythm  Neurologic:   Awake, alert, oriented x 3. No  apparent focal neurological           defect.      EKG: ST with frequent ventricular beats.     Assessment & Plan:     1. Viral gastroenteritis Push fluids with electrolytes. Prescription promethazine sent to his pharmacy. Discussed dietary precautions. Call if any fever, change in abdominal pain, or blood in stool.   2. Tachycardia Secondary to acute viral infection. Advised to push fluids as tolerated.  - EKG 12-Lead  3. Diabetes Counseled to stay off of Jardiance until his GI symptoms resolve. Call if any blood sugars over 300.       [name], MD  [facility]

## 2016-09-11 NOTE — Patient Instructions (Signed)

## 2016-09-12 ENCOUNTER — Telehealth: Payer: Self-pay | Admitting: Family Medicine

## 2016-09-12 NOTE — Telephone Encounter (Signed)
Ok to extend excuse for gastroenteritis until Monday, April 2.

## 2016-09-12 NOTE — Telephone Encounter (Signed)
Is this ok to do? Dr fishers patient-aa

## 2016-09-12 NOTE — Telephone Encounter (Signed)
Pt called and would like his note for work extended out until next Monday.  Still not feeling well.  His call back is [phone]  Thank steri

## 2016-09-12 NOTE — Telephone Encounter (Signed)
Patient advised note left upfront for pick up. KW

## 2016-09-18 ENCOUNTER — Telehealth: Payer: Self-pay | Admitting: *Deleted

## 2016-09-18 DIAGNOSIS — K649 Unspecified hemorrhoids: Secondary | ICD-10-CM

## 2016-09-18 NOTE — Telephone Encounter (Signed)
Patient was seen 09/11/2016 for stomach virus. Patient states that he has developed hemorrhoids since and wanted to know if he could get a prescription? Patient has tried some otc meds that have not helped with the pain. Patient is aware Caryn Section is out of the office and would like another provider to view message. Please advise?

## 2016-09-19 MED ORDER — HYDROCORTISONE 2.5 % RE CREA: 1.0000 "application " | TOPICAL_CREAM | Freq: Two times a day (BID) | RECTAL | 0 refills | Status: DC

## 2016-09-19 NOTE — Telephone Encounter (Signed)
Sent in some Anusol cream, apply 1-2 times daily to rectal area for hemorrhoids.

## 2016-09-19 NOTE — Telephone Encounter (Signed)
Advised patient as below.  

## 2016-09-25 ENCOUNTER — Ambulatory Visit: Payer: BLUE CROSS/BLUE SHIELD | Admitting: Family Medicine

## 2016-09-27 ENCOUNTER — Telehealth: Payer: Self-pay | Admitting: Family Medicine

## 2016-09-27 ENCOUNTER — Other Ambulatory Visit: Payer: Self-pay | Admitting: Family Medicine

## 2016-09-27 MED ORDER — OXYCODONE-ACETAMINOPHEN 7.5-325 MG PO TABS: 1.0000 | ORAL_TABLET | ORAL | 0 refills | Status: DC | PRN

## 2016-09-27 NOTE — Telephone Encounter (Signed)
Pt needs refill on his  oxyCODONE-acetaminophen (PERCOCET) 7.5-325 MG tablet  Thanks teri

## 2016-09-27 NOTE — Telephone Encounter (Signed)
Pt needs refill on  oxyCODONE-acetaminophen (PERCOCET) 7.5-325 MG tablet  Taking 08/28/16 -- Birdie Sons, MD    Take 1 tablet by mouth every 4 (four) hours as needed for severe pain.     Thanks  [employer]

## 2016-10-01 ENCOUNTER — Ambulatory Visit: Payer: BLUE CROSS/BLUE SHIELD | Admitting: Family Medicine

## 2016-10-06 ENCOUNTER — Telehealth: Payer: Self-pay | Admitting: Family Medicine

## 2016-10-06 DIAGNOSIS — E119 Type 2 diabetes mellitus without complications: Secondary | ICD-10-CM

## 2016-10-06 NOTE — Telephone Encounter (Signed)
Please check with patient to see if he has finished samples of Jardiance from March. If tolerating can send in prescription for Jardiance 10mg  daily, ,#30, no refills needs to get renal panel checked sometime in the next 2-3 weeks.

## 2016-10-07 NOTE — Telephone Encounter (Signed)
LMOVM for pt to return call 

## 2016-10-10 MED ORDER — EMPAGLIFLOZIN 10 MG PO TABS: 10.0000 mg | ORAL_TABLET | Freq: Every day | ORAL | 0 refills | Status: DC

## 2016-10-10 NOTE — Telephone Encounter (Signed)
LMOVM requesting pt to cb.

## 2016-10-10 NOTE — Telephone Encounter (Signed)
Patient was notified. Patient stated that he is tolerating Jaridance fine. He did go off the medication for about a week due to having the flu. Has since gone back on Jaridance. Patient stated that he will get labs done in 3 weeks. Lab ordered. Rx sent to pharmacy.

## 2016-10-11 ENCOUNTER — Other Ambulatory Visit: Payer: Self-pay | Admitting: *Deleted

## 2016-10-11 DIAGNOSIS — E119 Type 2 diabetes mellitus without complications: Secondary | ICD-10-CM

## 2016-10-11 MED ORDER — DAPAGLIFLOZIN PROPANEDIOL 5 MG PO TABS: 5.0000 mg | ORAL_TABLET | Freq: Every day | ORAL | 5 refills | Status: DC

## 2016-10-22 ENCOUNTER — Telehealth: Payer: Self-pay

## 2016-10-22 NOTE — Telephone Encounter (Signed)
We can change to either Invokana or Jardiance. He will have to check with his drug plan to see which one is covered better.

## 2016-10-22 NOTE — Telephone Encounter (Signed)
Patient stated that the Farxiga rx that was sent to pharmacy week was $300 out-of-pocket. Patient wanted to know if we can send a different medication? Please advise?

## 2016-10-22 NOTE — Telephone Encounter (Signed)
Pt called because he was given Jardiance samples which he was tolerating well. When pt called to have Jardiance prescription sent to the pharmacy, Wilder Glade was sent in instead. Is this correct? Please advise. Renaldo Fiddler, CMA

## 2016-10-23 MED ORDER — CANAGLIFLOZIN 300 MG PO TABS: 300.0000 mg | ORAL_TABLET | Freq: Every day | ORAL | 5 refills | Status: DC

## 2016-10-23 NOTE — Telephone Encounter (Signed)
Patient was notified. Patient stated that he will try the [place].

## 2016-10-24 ENCOUNTER — Other Ambulatory Visit: Payer: Self-pay | Admitting: Family Medicine

## 2016-10-24 MED ORDER — OXYCODONE-ACETAMINOPHEN 7.5-325 MG PO TABS: 1.0000 | ORAL_TABLET | ORAL | 0 refills | Status: DC | PRN

## 2016-10-24 NOTE — Telephone Encounter (Signed)
Pt contacted office for refill request on the following medications: ° °oxyCODONE-acetaminophen (PERCOCET) 7.5-325 MG tablet  ° °CB#[phone]/MW °

## 2016-11-11 ENCOUNTER — Other Ambulatory Visit: Payer: Self-pay | Admitting: Family Medicine

## 2016-11-19 ENCOUNTER — Other Ambulatory Visit: Payer: Self-pay | Admitting: Family Medicine

## 2016-11-19 DIAGNOSIS — N5089 Other specified disorders of the male genital organs: Secondary | ICD-10-CM

## 2016-11-22 ENCOUNTER — Other Ambulatory Visit: Payer: Self-pay | Admitting: Family Medicine

## 2016-11-22 DIAGNOSIS — F410 Panic disorder [episodic paroxysmal anxiety] without agoraphobia: Secondary | ICD-10-CM

## 2016-11-22 DIAGNOSIS — F32A Depression, unspecified: Secondary | ICD-10-CM

## 2016-11-22 DIAGNOSIS — F329 Major depressive disorder, single episode, unspecified: Secondary | ICD-10-CM

## 2016-11-22 NOTE — Telephone Encounter (Signed)
Pt also needs pain medication  oxyCODONE-acetaminophen (PERCOCET) 7.5-325 MG tablet  Thanks teri

## 2016-11-27 ENCOUNTER — Other Ambulatory Visit: Payer: Self-pay | Admitting: Family Medicine

## 2016-11-27 MED ORDER — OXYCODONE-ACETAMINOPHEN 7.5-325 MG PO TABS: 1.0000 | ORAL_TABLET | ORAL | 0 refills | Status: DC | PRN

## 2016-11-27 NOTE — Telephone Encounter (Signed)
Have printed prescription for oxycodone/apap but he is due for follow up o.v. This month. Need to schedule before medication refill is due.

## 2016-11-27 NOTE — Telephone Encounter (Signed)
Pt contacted office for refill request on the following medications: oxyCODONE-acetaminophen (PERCOCET) 7.5-325 MG tablet Last Rx: 10/24/16  Pt called on 11/22/16 and requested the refill. The message was attached to the refill request from the pharmacy for another medication. Pt would like to pick up Rx for oxyCODONE-acetaminophen (PERCOCET) 7.5-325 MG tablet today if possible. Please advise. Thanks TNP

## 2016-11-27 NOTE — Telephone Encounter (Signed)
Patient was notified he is due for ov.

## 2016-11-28 ENCOUNTER — Telehealth: Payer: Self-pay | Admitting: Family Medicine

## 2016-11-28 MED ORDER — GLIPIZIDE ER 5 MG PO TB24: 5.0000 mg | ORAL_TABLET | Freq: Every day | ORAL | 1 refills | Status: DC

## 2016-11-28 NOTE — Telephone Encounter (Signed)
Please advise 

## 2016-11-28 NOTE — Telephone Encounter (Signed)
[facility]. Need office visit within month.

## 2016-11-28 NOTE — Telephone Encounter (Signed)
Advised  ED 

## 2016-11-28 NOTE — Telephone Encounter (Signed)
Pt states Dr Caryn Section is trying to get pt to take a new diabetes medication and all that have be given are $400.00 to $450.00 a month.  Pt states per his insurance the Rx Glipizide would be covered by a lower co-pay.  Pt is asking if he can get a Rx for this.  [facility].  CB#[phone]/MW

## 2016-12-24 ENCOUNTER — Encounter: Payer: Self-pay | Admitting: Family Medicine

## 2016-12-24 ENCOUNTER — Ambulatory Visit (INDEPENDENT_AMBULATORY_CARE_PROVIDER_SITE_OTHER): Payer: BLUE CROSS/BLUE SHIELD | Admitting: Family Medicine

## 2016-12-24 VITALS — BP 114/62 | HR 80 | Temp 98.0°F | Resp 16 | Wt 372.0 lb

## 2016-12-24 DIAGNOSIS — F410 Panic disorder [episodic paroxysmal anxiety] without agoraphobia: Secondary | ICD-10-CM | POA: Diagnosis not present

## 2016-12-24 DIAGNOSIS — F325 Major depressive disorder, single episode, in full remission: Secondary | ICD-10-CM

## 2016-12-24 DIAGNOSIS — E119 Type 2 diabetes mellitus without complications: Secondary | ICD-10-CM

## 2016-12-24 DIAGNOSIS — G8929 Other chronic pain: Secondary | ICD-10-CM

## 2016-12-24 DIAGNOSIS — M545 Low back pain: Secondary | ICD-10-CM

## 2016-12-24 LAB — POCT GLYCOSYLATED HEMOGLOBIN (HGB A1C)
Est. average glucose Bld gHb Est-mCnc: 212
Hemoglobin A1C: 9

## 2016-12-24 MED ORDER — OXYCODONE-ACETAMINOPHEN 7.5-325 MG PO TABS: 1.0000 | ORAL_TABLET | ORAL | 0 refills | Status: DC | PRN

## 2016-12-24 MED ORDER — METFORMIN HCL ER 500 MG PO TB24: 1000.0000 mg | ORAL_TABLET | Freq: Two times a day (BID) | ORAL | 3 refills | Status: DC

## 2016-12-24 MED ORDER — ESCITALOPRAM OXALATE 10 MG PO TABS: 10.0000 mg | ORAL_TABLET | Freq: Every day | ORAL | 0 refills | Status: DC

## 2016-12-24 NOTE — Progress Notes (Signed)
Patient: Raymond Rose Male    DOB: 04-06-1976   41 y.o.   MRN: [id number] Visit Date: 12/24/2016  Today's Provider: [name], MD   Chief Complaint  Patient presents with  . Back Pain  . Diabetes   Subjective:    HPI     Follow up for Low back Pain  The patient was last seen for this 4 months ago. Changes made at last visit include refilling oxycodone.  He reports good compliance with treatment. He feels that condition is stable on medication. He is not having side effects.   ------------------------------------------------------------------------------------   Diabetes Mellitus Type II, Follow-up:   Lab Results  Component Value Date   HGBA1C 9.9 08/28/2016   HGBA1C 9.3 05/20/2016   HGBA1C 9.2 02/14/2016    Last seen for diabetes 4 months ago.  At that time he had stopped Jardiance due to cost. He was previoulsy on Trulicity in 1751 which he tolerated but was cost prohibited. So he was started on glipizide 5. He reports good compliance with treatment. He is not having side effects.  Current symptoms include hyperglycemia (he has noticed his FMS has been inthe 300's; he has worked on diet to bring this down to below 200), increase appetite and paresthesia of the feet and have been worsening. Home blood sugar records: fasting range: below 200, which has improved over the last 2 weeks  Episodes of hypoglycemia? no   Current Insulin Regimen: N/A Most Recent Eye Exam: due Weight trend: stable Prior visit with dietician: yes - [place] 2 years ago Current diet: improving; has cut out carbs to help improve sugars Current exercise: is not exercising due to heat  Pertinent Labs:    Component Value Date/Time   CHOL 222 (H) 05/23/2016 0950   TRIG 370 (H) 05/23/2016 0950   HDL 30 (L) 05/23/2016 0950   LDLCALC 118 (H) 05/23/2016 0950   CREATININE 0.82 05/23/2016 0950   CREATININE 0.68 09/07/2014 1206   CREATININE 0.68 09/07/2014 1206    Wt  Readings from Last 3 Encounters:  12/24/16 (!) 372 lb (168.7 kg)  09/11/16 (!) 343 lb (155.6 kg)  08/28/16 (!) 375 lb (170.1 kg)   Pt also has elevated transaminase level, which is believed to be secondary to fatty liver. This is to be rechecked once DM is better controlled. ------------------------------------------------------------------------ [name] also needs a refill on his escitalopram. He reports good compliance with treatment, and good symptom control.    Depression screen PHQ 2/9 05/23/2016  Decreased Interest 0  Down, Depressed, Hopeless 0  PHQ - 2 Score 0  Altered sleeping -  Tired, decreased energy -  Change in appetite -  Feeling bad or failure about yourself  -  Trouble concentrating -  Moving slowly or fidgety/restless -  Suicidal thoughts -  PHQ-9 Score -  Difficult doing work/chores -    Allergies  Allergen Reactions  . Morphine      Current Outpatient Prescriptions:  .  escitalopram (LEXAPRO) 10 MG tablet, TAKE 1 TABLET (10 MG TOTAL) BY MOUTH DAILY., Disp: 30 tablet, Rfl: 0 .  esomeprazole (NEXIUM) 40 MG capsule, TAKE 2 CAPSULES BY MOUTH DAILY, Disp: 60 capsule, Rfl: 2 .  glipiZIDE (GLUCOTROL XL) 5 MG 24 hr tablet, Take 1 tablet (5 mg total) by mouth daily with breakfast., Disp: 30 tablet, Rfl: 1 .  glucose blood (ONETOUCH VERIO) test strip, 1 strip daily. Reported on 10/20/2015, Disp: , Rfl:  .  ketoconazole (  NIZORAL) 2 % cream, APPLY TO AFFECTED AREA ONCE DAILY FOR 7 DAYS AS NEEDED, Disp: 15 g, Rfl: 0 .  metFORMIN (GLUCOPHAGE-XR) 500 MG 24 hr tablet, TAKE 2 TABLETS (1,000 MG TOTAL) BY MOUTH EVERY EVENING. WITH MEALS, Disp: 60 tablet, Rfl: 2 .  oxyCODONE-acetaminophen (PERCOCET) 7.5-325 MG tablet, Take 1 tablet by mouth every 4 (four) hours as needed for severe pain., Disp: 60 tablet, Rfl: 0  Review of Systems  Constitutional: Positive for appetite change (increased). Negative for activity change, chills, diaphoresis, fatigue, fever and unexpected weight  change.  Eyes: Negative for visual disturbance.  Respiratory: Negative for shortness of breath.   Cardiovascular: Negative for chest pain, palpitations and leg swelling.  Endocrine: Negative for polydipsia, polyphagia and polyuria.    Social History  Substance Use Topics  . Smoking status: Former Smoker    Years: 5.00    Quit date: 06/17/1997  . Smokeless tobacco: Never Used     Comment: started smoking at ahe 16, and quit at age 22  . Alcohol use 0.0 oz/week     Comment: occasional alcohol use   Objective:   BP 114/62 (BP Location: Right Arm, Patient Position: Sitting, Cuff Size: Large)   Pulse 80   Temp 98 F (36.7 C) (Oral)   Resp 16   Wt (!) 372 lb (168.7 kg)   BMI 50.45 kg/m  Vitals:   12/24/16 0951  BP: 114/62  Pulse: 80  Resp: 16  Temp: 98 F (36.7 C)  TempSrc: Oral  Weight: (!) 372 lb (168.7 kg)     Physical Exam  General Appearance:    Alert, cooperative, no distress, obese  Eyes:    PERRL, conjunctiva/corneas clear, EOM's intact       Lungs:     Clear to auscultation bilaterally, respirations unlabored  Heart:    Regular rate and rhythm  Neurologic:   Awake, alert, oriented x 3. No apparent focal neurological           defect.       Results for orders placed or performed in visit on 12/24/16  POCT glycosylated hemoglobin (Hb A1C)  Result Value Ref Range   Hemoglobin A1C 9.0    Est. average glucose Bld gHb Est-mCnc 212        Assessment & Plan:     1. Type 2 diabetes mellitus without complication, without long-term current use of insulin (HCC) Some improvement since adding glipizide. Will double metformin. He anticipates dietary improvements but states he is hungry all the time. We discussed appetitive suppressant effects of GLP1 agonist. Insurance coverage may have improved since last year. Will consider another trial if not improved at follow up in 3 months.  - POCT glycosylated hemoglobin (Hb A1C) - metFORMIN (GLUCOPHAGE-XR) 500 MG 24 hr tablet;  Take 2 tablets (1,000 mg total) by mouth 2 (two) times daily.  Dispense: 120 tablet; Refill: 3  2. Panic attacks Doing well on current SSRI. Refill - escitalopram (LEXAPRO) 10 MG tablet; Take 1 tablet (10 mg total) by mouth daily.  Dispense: 30 tablet; Refill: 0  3. Major depressive disorder with single episode, in full remission [facility]) Doing well.  - escitalopram (LEXAPRO) 10 MG tablet; Take 1 tablet (10 mg total) by mouth daily.  Dispense: 30 tablet; Refill: 0  4. Chronic low back pain, unspecified back pain laterality, with sciatica presence unspecified Needs refill oxycodone/apap which he usually takes once a day.  - oxyCODONE-acetaminophen (PERCOCET) 7.5-325 MG tablet; Take 1 tablet by mouth every  4 (four) hours as needed for severe pain.  Dispense: 60 tablet; Refill: 0       [name], MD  [facility]

## 2017-01-23 ENCOUNTER — Other Ambulatory Visit: Payer: Self-pay | Admitting: Family Medicine

## 2017-01-23 ENCOUNTER — Other Ambulatory Visit: Payer: Self-pay | Admitting: Emergency Medicine

## 2017-01-23 DIAGNOSIS — G8929 Other chronic pain: Secondary | ICD-10-CM

## 2017-01-23 DIAGNOSIS — M545 Low back pain: Principal | ICD-10-CM

## 2017-01-24 ENCOUNTER — Ambulatory Visit (INDEPENDENT_AMBULATORY_CARE_PROVIDER_SITE_OTHER): Payer: BLUE CROSS/BLUE SHIELD | Admitting: Family Medicine

## 2017-01-24 ENCOUNTER — Encounter: Payer: Self-pay | Admitting: Family Medicine

## 2017-01-24 VITALS — BP 120/80 | HR 77 | Temp 98.0°F | Resp 16 | Wt 363.0 lb

## 2017-01-24 DIAGNOSIS — F410 Panic disorder [episodic paroxysmal anxiety] without agoraphobia: Secondary | ICD-10-CM | POA: Diagnosis not present

## 2017-01-24 DIAGNOSIS — F325 Major depressive disorder, single episode, in full remission: Secondary | ICD-10-CM | POA: Diagnosis not present

## 2017-01-24 DIAGNOSIS — E119 Type 2 diabetes mellitus without complications: Secondary | ICD-10-CM | POA: Diagnosis not present

## 2017-01-24 MED ORDER — OXYCODONE-ACETAMINOPHEN 7.5-325 MG PO TABS: 1.0000 | ORAL_TABLET | ORAL | 0 refills | Status: DC | PRN

## 2017-01-24 MED ORDER — ESCITALOPRAM OXALATE 10 MG PO TABS: 20.0000 mg | ORAL_TABLET | Freq: Every day | ORAL | 0 refills | Status: DC

## 2017-01-24 NOTE — Patient Instructions (Signed)
   Start taking 2 (two) escitalopram 10mg  each day instead of one.

## 2017-01-24 NOTE — Progress Notes (Signed)
Patient: Raymond Rose Male    DOB: 12/29/1975   41 y.o.   MRN: [id number] Visit Date: 01/24/2017  Today's Provider: [name], MD   Chief Complaint  Patient presents with  . Follow-up  . Depression   Subjective:    HPI  Major depressive disorder with single episode, in full remission [facility]) Patient presents with his wife today reporting that he has been much more depressed than usual the last two days. He has not missed any dose of escitalopram, has not had any recent change in generic manufactures. Has felt fine physically. Only change is that he has been on a low carb diet the last month, and is frustrated that he has only lost 10 pounds despite what he thinks have been major changes in diet. He denies any other changes at home or work that would affect his mood. He has been taking escitalopram for a few years and feel it worked very well initially. We did double his dose of metformin for diabetes about a month ago but didn't notice any side effects.   Wt Readings from Last 3 Encounters:  01/24/17 (!) 363 lb (164.7 kg)  12/24/16 (!) 372 lb (168.7 kg)  09/11/16 (!) 343 lb (155.6 kg)     Allergies  Allergen Reactions  . Morphine      Current Outpatient Prescriptions:  .  escitalopram (LEXAPRO) 10 MG tablet, Take 1 tablet (10 mg total) by mouth daily., Disp: 30 tablet, Rfl: 0 .  esomeprazole (NEXIUM) 40 MG capsule, TAKE 2 CAPSULES BY MOUTH DAILY, Disp: 60 capsule, Rfl: 2 .  glipiZIDE (GLUCOTROL XL) 5 MG 24 hr tablet, TAKE 1 TABLET (5 MG TOTAL) BY MOUTH DAILY WITH BREAKFAST., Disp: 30 tablet, Rfl: 3 .  glucose blood (ONETOUCH VERIO) test strip, 1 strip daily. Reported on 10/20/2015, Disp: , Rfl:  .  ketoconazole (NIZORAL) 2 % cream, APPLY TO AFFECTED AREA ONCE DAILY FOR 7 DAYS AS NEEDED, Disp: 15 g, Rfl: 0 .  metFORMIN (GLUCOPHAGE-XR) 500 MG 24 hr tablet, Take 2 tablets (1,000 mg total) by mouth 2 (two) times daily., Disp: 120 tablet, Rfl: 3 .  oxyCODONE-acetaminophen  (PERCOCET) 7.5-325 MG tablet, Take 1 tablet by mouth every 4 (four) hours as needed for severe pain., Disp: 60 tablet, Rfl: 0  Review of Systems  Constitutional: Negative for appetite change, chills and fever.  Respiratory: Negative for chest tightness, shortness of breath and wheezing.   Cardiovascular: Negative for chest pain and palpitations.  Gastrointestinal: Negative for abdominal pain, nausea and vomiting.    Social History  Substance Use Topics  . Smoking status: Former Smoker    Years: 5.00    Quit date: 06/17/1997  . Smokeless tobacco: Never Used     Comment: started smoking at [age]he 16, and quit at age 28  . Alcohol use 0.0 oz/week     Comment: occasional alcohol use   Objective:   BP 120/80 (BP Location: Right Arm, Patient Position: Sitting, Cuff Size: Large)   Pulse 77   Temp 98 F (36.7 C) (Oral)   Resp 16   Wt (!) 363 lb (164.7 kg)   SpO2 96%   BMI 49.23 kg/m  Vitals:   01/24/17 1553  BP: 120/80  Pulse: 77  Resp: 16  Temp: 98 F (36.7 C)  TempSrc: Oral  SpO2: 96%  Weight: (!) 363 lb (164.7 kg)     Physical Exam  General appearance: alert, obese, cooperative and in no distress  Head: Normocephalic, without obvious abnormality, atraumatic Resp: Respirations even and unlabored Extremities: No gross deformities Skin: Skin color, texture, turgor normal. No rashes seen  Psych: Appropriate mood and affect. Neurologic: Mental status: Alert, oriented to person, place, and time, thought content appropriate.     Assessment & Plan:     1. Major depressive disorder with single episode, in full remission ([place]) Fairly sudden worsening of symptoms the last couple of days. Will double dose of escitalopram while awaiting lab results.  - CBC - Comprehensive metabolic panel - T4 AND TSH - escitalopram (LEXAPRO) 10 MG tablet; Take 2 tablets (20 mg total) by mouth daily.  Dispense: 1 tablet; Refill: 0  2. Panic attacks  - escitalopram (LEXAPRO) 10 MG tablet; Take 2  tablets (20 mg total) by mouth daily.  Dispense: 1 tablet; Refill: 0  3. Type 2 diabetes mellitus without complication, without long-term current use of insulin (HCC) Recently doubled dose of metformin.  - Vitamin B12 - T4 AND TSH       [name], MD  [facility]

## 2017-01-25 LAB — T4 AND TSH
T4, Total: 7.8 ug/dL (ref 4.5–12.0)
TSH: 1.57 u[IU]/mL (ref 0.450–4.500)

## 2017-01-25 LAB — CBC
Hematocrit: 40 % (ref 37.5–51.0)
Hemoglobin: 13.9 g/dL (ref 13.0–17.7)
MCH: 28.1 pg (ref 26.6–33.0)
MCHC: 34.8 g/dL (ref 31.5–35.7)
MCV: 81 fL (ref 79–97)
Platelets: 315 10*3/uL (ref 150–379)
RBC: 4.95 x10E6/uL (ref 4.14–5.80)
RDW: 16 % — ABNORMAL HIGH (ref 12.3–15.4)
WBC: 10.2 10*3/uL (ref 3.4–10.8)

## 2017-01-25 LAB — COMPREHENSIVE METABOLIC PANEL
ALT: 35 IU/L (ref 0–44)
AST: 28 IU/L (ref 0–40)
Albumin/Globulin Ratio: 1.5 (ref 1.2–2.2)
Albumin: 4.6 g/dL (ref 3.5–5.5)
Alkaline Phosphatase: 78 IU/L (ref 39–117)
BUN/Creatinine Ratio: 19 (ref 9–20)
BUN: 14 mg/dL (ref 6–20)
Bilirubin Total: 0.4 mg/dL (ref 0.0–1.2)
CO2: 23 mmol/L (ref 20–29)
Calcium: 9.9 mg/dL (ref 8.7–10.2)
Chloride: 99 mmol/L (ref 96–106)
Creatinine, Ser: 0.74 mg/dL — ABNORMAL LOW (ref 0.76–1.27)
GFR calc Af Amer: 135 mL/min/{1.73_m2} (ref >59)
GFR calc non Af Amer: 117 mL/min/{1.73_m2} (ref >59)
Globulin, Total: 3 g/dL (ref 1.5–4.5)
Glucose: 117 mg/dL — ABNORMAL HIGH (ref 65–99)
Potassium: 4.2 mmol/L (ref 3.5–5.2)
Sodium: 139 mmol/L (ref 134–144)
Total Protein: 7.6 g/dL (ref 6.0–8.5)

## 2017-01-25 LAB — VITAMIN B12: Vitamin B-12: 475 pg/mL (ref 232–1245)

## 2017-02-19 ENCOUNTER — Other Ambulatory Visit: Payer: Self-pay | Admitting: Family Medicine

## 2017-02-20 ENCOUNTER — Other Ambulatory Visit: Payer: Self-pay | Admitting: Family Medicine

## 2017-02-20 DIAGNOSIS — F325 Major depressive disorder, single episode, in full remission: Secondary | ICD-10-CM

## 2017-02-20 DIAGNOSIS — F410 Panic disorder [episodic paroxysmal anxiety] without agoraphobia: Secondary | ICD-10-CM

## 2017-02-24 ENCOUNTER — Other Ambulatory Visit: Payer: Self-pay | Admitting: Family Medicine

## 2017-02-24 DIAGNOSIS — G8929 Other chronic pain: Secondary | ICD-10-CM

## 2017-02-24 DIAGNOSIS — M545 Low back pain: Principal | ICD-10-CM

## 2017-02-24 NOTE — Telephone Encounter (Signed)
Pt contacted office for refill request on the following medications:  oxyCODONE-acetaminophen (PERCOCET) 7.5-325 MG tablet   CB#[phone]/MW

## 2017-02-25 MED ORDER — OXYCODONE-ACETAMINOPHEN 7.5-325 MG PO TABS: 1.0000 | ORAL_TABLET | ORAL | 0 refills | Status: DC | PRN

## 2017-03-19 ENCOUNTER — Telehealth: Payer: Self-pay | Admitting: Family Medicine

## 2017-03-19 DIAGNOSIS — F410 Panic disorder [episodic paroxysmal anxiety] without agoraphobia: Secondary | ICD-10-CM

## 2017-03-19 DIAGNOSIS — F325 Major depressive disorder, single episode, in full remission: Secondary | ICD-10-CM

## 2017-03-19 MED ORDER — ESCITALOPRAM OXALATE 20 MG PO TABS: 20.0000 mg | ORAL_TABLET | Freq: Every day | ORAL | 1 refills | Status: DC

## 2017-03-19 NOTE — Telephone Encounter (Signed)
Pt contacted office for refill request on the following medications:  escitalopram (LEXAPRO) 10 MG tablet  Pt stated that it had been discussed to changing this to 20 mg and he would like to go ahead and have a new Rx for 20 mg sent to [facility]. Please advise. Thanks TNP

## 2017-03-26 ENCOUNTER — Other Ambulatory Visit: Payer: Self-pay | Admitting: Family Medicine

## 2017-03-26 DIAGNOSIS — M545 Low back pain: Principal | ICD-10-CM

## 2017-03-26 DIAGNOSIS — G8929 Other chronic pain: Secondary | ICD-10-CM

## 2017-03-26 MED ORDER — OXYCODONE-ACETAMINOPHEN 7.5-325 MG PO TABS: 1.0000 | ORAL_TABLET | ORAL | 0 refills | Status: DC | PRN

## 2017-03-26 NOTE — Telephone Encounter (Signed)
Pt contacted office for refill request on the following medications:  oxyCODONE-acetaminophen (PERCOCET) 7.5-325 MG tablet  CB#[phone]/MW

## 2017-03-27 ENCOUNTER — Ambulatory Visit: Payer: BLUE CROSS/BLUE SHIELD | Admitting: Family Medicine

## 2017-03-28 ENCOUNTER — Other Ambulatory Visit: Payer: Self-pay | Admitting: Family Medicine

## 2017-03-28 DIAGNOSIS — M545 Low back pain: Principal | ICD-10-CM

## 2017-03-28 DIAGNOSIS — G8929 Other chronic pain: Secondary | ICD-10-CM

## 2017-03-28 MED ORDER — OXYCODONE-ACETAMINOPHEN 7.5-325 MG PO TABS: 1.0000 | ORAL_TABLET | ORAL | 0 refills | Status: DC | PRN

## 2017-03-28 NOTE — Progress Notes (Signed)
Prescription refill

## 2017-03-31 ENCOUNTER — Encounter: Payer: Self-pay | Admitting: Family Medicine

## 2017-03-31 ENCOUNTER — Ambulatory Visit (INDEPENDENT_AMBULATORY_CARE_PROVIDER_SITE_OTHER): Payer: BLUE CROSS/BLUE SHIELD | Admitting: Family Medicine

## 2017-03-31 VITALS — BP 120/70 | HR 86 | Temp 98.4°F | Resp 16 | Ht 72.0 in | Wt 336.0 lb

## 2017-03-31 DIAGNOSIS — F325 Major depressive disorder, single episode, in full remission: Secondary | ICD-10-CM | POA: Diagnosis not present

## 2017-03-31 DIAGNOSIS — M545 Low back pain: Secondary | ICD-10-CM

## 2017-03-31 DIAGNOSIS — G8929 Other chronic pain: Secondary | ICD-10-CM

## 2017-03-31 DIAGNOSIS — E119 Type 2 diabetes mellitus without complications: Secondary | ICD-10-CM

## 2017-03-31 DIAGNOSIS — F410 Panic disorder [episodic paroxysmal anxiety] without agoraphobia: Secondary | ICD-10-CM

## 2017-03-31 LAB — POCT UA - MICROALBUMIN: Microalbumin Ur, POC: NEGATIVE mg/L

## 2017-03-31 LAB — POCT GLYCOSYLATED HEMOGLOBIN (HGB A1C)
Est. average glucose Bld gHb Est-mCnc: 105
Hemoglobin A1C: 5.3

## 2017-03-31 NOTE — Patient Instructions (Signed)
   I recommend that you have several small low carbohydrate or high protein snacks throughout the day, such as nuts, cellery, broccoli throughout the day to prevent hypoglycemia

## 2017-03-31 NOTE — Progress Notes (Signed)
Patient: Raymond Rose Male    DOB: 05-29-1976   41 y.o.   MRN: [id number] Visit Date: 03/31/2017   Today's Provider: [name], MD   Chief Complaint  Patient presents with  . Follow-up  . Diabetes  . Back Pain  . Depression   Subjective:    HPI   Diabetes Mellitus Type II, Follow-up:   Lab Results  Component Value Date   HGBA1C 9.0 12/24/2016   HGBA1C 9.9 08/28/2016   HGBA1C 9.3 05/20/2016   Last seen for diabetes 3 months ago.  Management since then includes; labs checked, no changes. He reports good compliance with treatment. He is not having side effects. none Current symptoms include none and have been unchanged. Home blood sugar records: fasting range: 127-170  Episodes of hypoglycemia? no   Current Insulin Regimen: n/a Most Recent Eye Exam: 2 years ago at [facility] Weight trend: decreasing steadily Prior visit with dietician: no Current diet: in general, a "healthy" diet   Current exercise: none  ------------------------------------------------------  Wt Readings from Last 3 Encounters:  03/31/17 (!) 336 lb (152.4 kg)  01/24/17 (!) 363 lb (164.7 kg)  12/24/16 (!) 372 lb (168.7 kg)    Chronic low back pain, unspecified back pain laterality, with sciatica presence unspecified From 12/24/2016-no changes. Refilled oxycodone/apap which he usually takes once a day and remains effective.   Major depressive disorder with single episode, in full remission ([place]) From 01/24/2017-labs checked. Doubled dose of escitalopram which he reports he is doing well with.   Panic attacks From 01/24/2017-labs checked. Doubled dose of escitalopram and has not had any panic attacks since.     Allergies  Allergen Reactions  . Morphine      Current Outpatient Prescriptions:  .  escitalopram (LEXAPRO) 20 MG tablet, Take 1 tablet (20 mg total) by mouth daily., Disp: 90 tablet, Rfl: 1 .  glipiZIDE (GLUCOTROL XL) 5 MG 24 hr tablet, TAKE 1 TABLET (5 MG  TOTAL) BY MOUTH DAILY WITH BREAKFAST., Disp: 30 tablet, Rfl: 3 .  glucose blood (ONETOUCH VERIO) test strip, 1 strip daily. Reported on 10/20/2015, Disp: , Rfl:  .  ketoconazole (NIZORAL) 2 % cream, APPLY TO AFFECTED AREA ONCE DAILY FOR 7 DAYS AS NEEDED, Disp: 15 g, Rfl: 0 .  metFORMIN (GLUCOPHAGE-XR) 500 MG 24 hr tablet, Take 2 tablets (1,000 mg total) by mouth 2 (two) times daily., Disp: 120 tablet, Rfl: 3 .  omeprazole (PRILOSEC) 40 MG capsule, TAKE 1 CAPSULE (40 MG TOTAL) BY MOUTH DAILY., Disp: 30 capsule, Rfl: 2 .  oxyCODONE-acetaminophen (PERCOCET) 7.5-325 MG tablet, Take 1 tablet by mouth every 4 (four) hours as needed for severe pain., Disp: 60 tablet, Rfl: 0  Review of Systems  Constitutional: Negative for appetite change, chills and fever.  Respiratory: Negative for chest tightness, shortness of breath and wheezing.   Cardiovascular: Negative for chest pain and palpitations.  Gastrointestinal: Negative for abdominal pain, nausea and vomiting.    Social History  Substance Use Topics  . Smoking status: Former Smoker    Years: 5.00    Quit date: 06/17/1997  . Smokeless tobacco: Never Used     Comment: started smoking at [age]he 16, and quit at age 20  . Alcohol use 0.0 oz/week     Comment: occasional alcohol use   Objective:   BP 120/70 (BP Location: Right Arm, Patient Position: Sitting, Cuff Size: Large)   Pulse 86   Temp 98.4 F (36.9 C) (Oral)  Resp 16   Ht 6' (1.829 m)   Wt (!) 336 lb (152.4 kg)   SpO2 98%   BMI 45.57 kg/m  Vitals:   03/31/17 1619  BP: 120/70  Pulse: 86  Resp: 16  Temp: 98.4 F (36.9 C)  TempSrc: Oral  SpO2: 98%  Weight: (!) 336 lb (152.4 kg)  Height: 6' (1.829 m)     Physical Exam  General Appearance:    Alert, cooperative, no distress, obese  Eyes:    PERRL, conjunctiva/corneas clear, EOM's intact       Lungs:     Clear to auscultation bilaterally, respirations unlabored  Heart:    Regular rate and rhythm  Neurologic:   Awake, alert,  oriented x 3. No apparent focal neurological           defect.       Results for orders placed or performed in visit on 03/31/17  POCT glycosylated hemoglobin (Hb A1C)  Result Value Ref Range   Hemoglobin A1C 5.3    Est. average glucose Bld gHb Est-mCnc 105   POCT UA - Microalbumin  Result Value Ref Range   Microalbumin Ur, POC negative mg/L       Assessment & Plan:     1. Type 2 diabetes mellitus without complication, without long-term current use of insulin (HCC) Much better. He states he has been on diet eating only once a day in the evening. Encouraged to have small low or no carbohydrate snacks during the day to reduce risk of hypoglycemia. Continue current medications.  Call if any sugars under 100.  - POCT glycosylated hemoglobin (Hb A1C) - POCT UA - Microalbumin  2. Morbid obesity ([place])   3. Panic attacks Well controlled, continue escitalpram.   4. Major depressive disorder with single episode, in full remission ([place]) Well controlled.  Continue current medications.    5. Chronic low back pain, unspecified back pain laterality, with sciatica presence unspecified Doing well with prn oxycodone/apap  Return in about 4 months (around 08/01/2017).        [name], MD  [facility]

## 2017-04-08 ENCOUNTER — Telehealth: Payer: Self-pay | Admitting: *Deleted

## 2017-04-08 DIAGNOSIS — E7800 Pure hypercholesterolemia, unspecified: Secondary | ICD-10-CM

## 2017-04-08 NOTE — Telephone Encounter (Signed)
Patient wanted to know if he is due for any labs? He stated that he has met his deductible this year and he wanted to get what lab work he needs before the end of the year. Please advise?

## 2017-04-08 NOTE — Telephone Encounter (Signed)
Lab slip printed and placed up front.

## 2017-04-08 NOTE — Telephone Encounter (Signed)
Can order lipid panel and met c for hyperlipidemia.

## 2017-04-08 NOTE — Telephone Encounter (Signed)
Left detailed massage on pt's vm.

## 2017-04-17 ENCOUNTER — Other Ambulatory Visit: Payer: Self-pay

## 2017-04-17 ENCOUNTER — Other Ambulatory Visit: Payer: Self-pay | Admitting: Family Medicine

## 2017-04-17 DIAGNOSIS — G8929 Other chronic pain: Secondary | ICD-10-CM

## 2017-04-17 DIAGNOSIS — M545 Low back pain: Principal | ICD-10-CM

## 2017-04-17 DIAGNOSIS — E119 Type 2 diabetes mellitus without complications: Secondary | ICD-10-CM

## 2017-04-17 MED ORDER — OXYCODONE-ACETAMINOPHEN 7.5-325 MG PO TABS: 1.0000 | ORAL_TABLET | ORAL | 0 refills | Status: DC | PRN

## 2017-04-17 NOTE — Telephone Encounter (Signed)
Patient requesting refill on Oxycodone-Acetaminophen. Last fill was 03/28/17-Louise Victory Estell Harpin, RMA

## 2017-05-13 ENCOUNTER — Other Ambulatory Visit: Payer: Self-pay | Admitting: Family Medicine

## 2017-05-13 DIAGNOSIS — G8929 Other chronic pain: Secondary | ICD-10-CM

## 2017-05-13 DIAGNOSIS — M545 Low back pain: Principal | ICD-10-CM

## 2017-05-13 NOTE — Telephone Encounter (Signed)
Pt contacted office for refill request on the following medications:  oxyCODONE-acetaminophen (PERCOCET) 7.5-325 MG tablet  [facility].   Please advise. Thanks TNP

## 2017-05-14 MED ORDER — OXYCODONE-ACETAMINOPHEN 7.5-325 MG PO TABS: 1.0000 | ORAL_TABLET | ORAL | 0 refills | Status: DC | PRN

## 2017-05-14 NOTE — Telephone Encounter (Signed)
Please advise patient that oxycodone/apap  prescription has been sent electronically to   [id number] [place] [place] [id number] Phone: [phone] Fax: [phone]

## 2017-05-14 NOTE — Telephone Encounter (Signed)
Left detailed message on pt's vm

## 2017-05-22 ENCOUNTER — Other Ambulatory Visit: Payer: Self-pay | Admitting: Family Medicine

## 2017-06-05 ENCOUNTER — Other Ambulatory Visit: Payer: Self-pay | Admitting: Family Medicine

## 2017-06-05 DIAGNOSIS — M545 Low back pain: Principal | ICD-10-CM

## 2017-06-05 DIAGNOSIS — G8929 Other chronic pain: Secondary | ICD-10-CM

## 2017-06-05 MED ORDER — OXYCODONE-ACETAMINOPHEN 7.5-325 MG PO TABS: 1.0000 | ORAL_TABLET | ORAL | 0 refills | Status: DC | PRN

## 2017-06-05 NOTE — Telephone Encounter (Signed)
Pt contacted office for refill request on the following medications:  oxyCODONE-acetaminophen (PERCOCET) 7.5-325 MG tablet   [facility] [place]  Last Rx: 05/14/17 LOV: 03/31/17  Please advise. Thanks TNP

## 2017-06-26 ENCOUNTER — Other Ambulatory Visit: Payer: Self-pay

## 2017-06-26 DIAGNOSIS — G8929 Other chronic pain: Secondary | ICD-10-CM

## 2017-06-26 DIAGNOSIS — M545 Low back pain: Principal | ICD-10-CM

## 2017-06-26 MED ORDER — OXYCODONE-ACETAMINOPHEN 7.5-325 MG PO TABS: 1.0000 | ORAL_TABLET | ORAL | 0 refills | Status: DC | PRN

## 2017-06-30 ENCOUNTER — Other Ambulatory Visit: Payer: Self-pay | Admitting: Family Medicine

## 2017-07-18 ENCOUNTER — Other Ambulatory Visit: Payer: Self-pay | Admitting: Family Medicine

## 2017-07-18 DIAGNOSIS — G8929 Other chronic pain: Secondary | ICD-10-CM

## 2017-07-18 DIAGNOSIS — M545 Low back pain: Principal | ICD-10-CM

## 2017-07-18 MED ORDER — OXYCODONE-ACETAMINOPHEN 7.5-325 MG PO TABS: 1.0000 | ORAL_TABLET | ORAL | 0 refills | Status: DC | PRN

## 2017-07-18 NOTE — Telephone Encounter (Signed)
Pt contacted office for refill request on the following medications:  oxyCODONE-acetaminophen (PERCOCET) 7.5-325 MG tablet   [facility] [place]  Last Rx: 06/26/17 LOV: 03/31/17  Please advise. Thanks TNP

## 2017-07-21 NOTE — Telephone Encounter (Signed)
Patient advised.

## 2017-08-01 ENCOUNTER — Ambulatory Visit (INDEPENDENT_AMBULATORY_CARE_PROVIDER_SITE_OTHER): Payer: BLUE CROSS/BLUE SHIELD | Admitting: Family Medicine

## 2017-08-01 ENCOUNTER — Encounter: Payer: Self-pay | Admitting: Family Medicine

## 2017-08-01 ENCOUNTER — Other Ambulatory Visit: Payer: Self-pay | Admitting: Family Medicine

## 2017-08-01 VITALS — BP 128/88 | HR 81 | Temp 98.2°F | Resp 16 | Wt 394.0 lb

## 2017-08-01 DIAGNOSIS — F325 Major depressive disorder, single episode, in full remission: Secondary | ICD-10-CM | POA: Diagnosis not present

## 2017-08-01 DIAGNOSIS — B370 Candidal stomatitis: Secondary | ICD-10-CM

## 2017-08-01 DIAGNOSIS — E119 Type 2 diabetes mellitus without complications: Secondary | ICD-10-CM

## 2017-08-01 DIAGNOSIS — N342 Other urethritis: Secondary | ICD-10-CM

## 2017-08-01 DIAGNOSIS — F410 Panic disorder [episodic paroxysmal anxiety] without agoraphobia: Secondary | ICD-10-CM | POA: Diagnosis not present

## 2017-08-01 LAB — POCT GLYCOSYLATED HEMOGLOBIN (HGB A1C)
Est. average glucose Bld gHb Est-mCnc: 212
Hemoglobin A1C: 9

## 2017-08-01 MED ORDER — BUPROPION HCL ER (XL) 150 MG PO TB24: 150.0000 mg | ORAL_TABLET | Freq: Every day | ORAL | 1 refills | Status: DC

## 2017-08-01 MED ORDER — BUPROPION HCL ER (XL) 150 MG PO TB24: ORAL_TABLET | ORAL | 1 refills | Status: DC

## 2017-08-01 MED ORDER — FLUCONAZOLE 150 MG PO TABS: 150.0000 mg | ORAL_TABLET | Freq: Once | ORAL | 2 refills | Status: AC

## 2017-08-01 NOTE — Progress Notes (Signed)
Patient: Raymond Rose Male    DOB: 11-15-1975   42 y.o.   MRN: [id number] Visit Date: 08/01/2017  Today's Provider: [name], MD   Chief Complaint  Patient presents with  . Diabetes    follow up  . Back Pain    follow up  . Panic Attack    follow up  . Depression    follow up   Subjective:    HPI   Diabetes Mellitus Type II, Follow-up:   Lab Results  Component Value Date   HGBA1C 5.3 03/31/2017   HGBA1C 9.0 12/24/2016   HGBA1C 9.9 08/28/2016   Last seen for diabetes 4 months ago.  Management since then includes; patient was encouraged to have small low no carb snacks during the day, since he was only eating 1 meal daily. He reports good compliance with treatment. He is not having side effects.  Current symptoms include none and have been stable. Home blood sugar records: blood sugars are rarely checked  Episodes of hypoglycemia? no   Current Insulin Regimen: none Most Recent Eye Exam: 05/2017 Weight trend: increasing steadily Prior visit with dietician: no Current diet: in general, an "unhealthy" diet Current exercise: golfing  He had been on ketogenic diet until the holidays started, but since getting off diet has gained 50 pounds.  Wt Readings from Last 5 Encounters:  08/01/17 (!) 394 lb (178.7 kg)  03/31/17 (!) 336 lb (152.4 kg)  01/24/17 (!) 363 lb (164.7 kg)  12/24/16 (!) 372 lb (168.7 kg)  09/11/16 (!) 343 lb (155.6 kg)    ------------------------------------------------------------------------    Major depressive disorder with single episode, in full remission [facility]) He states he has been more depressed the lat couple of months, doesn't feel escitalopram is working as well as it used to. Is tolerating well. Is a little anxious, but no panic attacks. Depression comes and goes but is much more frequent the last few months.   Chronic low back pain, unspecified back pain laterality, with sciatica presence unspecified -Doing well with  prn oxycodone/apap. No changes made. Today patient reports good compliance with treatment, good tolerance and fair symptom control. Takes occasional oxycodone/apap which he is tolerating well.    Allergies  Allergen Reactions  . Morphine      Current Outpatient Medications:  .  escitalopram (LEXAPRO) 20 MG tablet, Take 1 tablet (20 mg total) by mouth daily., Disp: 90 tablet, Rfl: 1 .  glipiZIDE (GLUCOTROL XL) 5 MG 24 hr tablet, TAKE 1 TABLET (5 MG TOTAL) BY MOUTH DAILY WITH BREAKFAST., Disp: 30 tablet, Rfl: 11 .  glucose blood (ONETOUCH VERIO) test strip, 1 strip daily. Reported on 10/20/2015, Disp: , Rfl:  .  ketoconazole (NIZORAL) 2 % cream, APPLY TO AFFECTED AREA ONCE DAILY FOR 7 DAYS AS NEEDED, Disp: 15 g, Rfl: 0 .  metFORMIN (GLUCOPHAGE-XR) 500 MG 24 hr tablet, TAKE 2 TABLETS BY MOUTH TWICE A DAY, Disp: 120 tablet, Rfl: 3 .  omeprazole (PRILOSEC) 40 MG capsule, TAKE 1 CAPSULE (40 MG TOTAL) BY MOUTH DAILY., Disp: 30 capsule, Rfl: 2 .  oxyCODONE-acetaminophen (PERCOCET) 7.5-325 MG tablet, Take 1 tablet by mouth every 4 (four) hours as needed for severe pain., Disp: 60 tablet, Rfl: 0  Review of Systems  Constitutional: Negative for appetite change, chills and fever.  Respiratory: Negative for chest tightness, shortness of breath and wheezing.   Cardiovascular: Negative for chest pain and palpitations.  Gastrointestinal: Negative for abdominal pain, nausea and vomiting.  Musculoskeletal: Positive for arthralgias and back pain.  Psychiatric/Behavioral: Positive for dysphoric mood. The patient is nervous/anxious.     Social History   Tobacco Use  . Smoking status: Former Smoker    Years: 5.00    Last attempt to quit: 06/17/1997    Years since quitting: 20.1  . Smokeless tobacco: Never Used  . Tobacco comment: started smoking at [age]he 16, and quit at age 67  Substance Use Topics  . Alcohol use: Yes    Alcohol/week: 0.0 oz    Comment: occasional alcohol use   Objective:   BP 128/88  (BP Location: Right Arm, Patient Position: Sitting, Cuff Size: Large)   Pulse 81   Temp 98.2 F (36.8 C) (Oral)   Resp 16   Wt (!) 394 lb (178.7 kg)   SpO2 98%   BMI 53.44 kg/m  There were no vitals filed for this visit.   Physical Exam  General Appearance:    Alert, cooperative, no distress, morbidly obese  Eyes:    PERRL, conjunctiva/corneas clear, EOM's intact       Lungs:     Clear to auscultation bilaterally, respirations unlabored  Heart:    Regular rate and rhythm  Neurologic:   Awake, alert, oriented x 3. No apparent focal neurological           defect.       Results for orders placed or performed in visit on 08/01/17  POCT HgB A1C  Result Value Ref Range   Hemoglobin A1C 9.0    Est. average glucose Bld gHb Est-mCnc 212         Assessment & Plan:     1. Type 2 diabetes mellitus without complication, without long-term current use of insulin ([place]) Had been well controlled when on ketogenic diet which he was tolerating well. He is going to get back on diet and to return in 3 months to check a1c. Continue current medications.   - POCT HgB A1C  2. Urethritis Need rf - fluconazole (DIFLUCAN) 150 MG tablet; Take 1 tablet (150 mg total) by mouth once for 1 dose.  Dispense: 1 tablet; Refill: 2  3. Thrush  - fluconazole (DIFLUCAN) 150 MG tablet; Take 1 tablet (150 mg total) by mouth once for 1 dose.  Dispense: 1 tablet; Refill: 2  4. Morbid obesity (HCC) Up 50 pounds since getting off of diet.   5. Panic attacks Well controlled on escitalopram which we well continue. Counseled that anxiety may worsen a little when initiating bupropion below.   6. Major depressive disorder with single episode, in full remission ([place])  - buPROPion (WELLBUTRIN XL) 150 MG 24 hr tablet; 1-2 tablets daily  Dispense: 30 tablet; Refill: 1  Can call for refill and let me know if 150mg  or 300mg  is working well when prescription is out.       [name], MD  [facility]

## 2017-08-04 ENCOUNTER — Ambulatory Visit: Payer: Self-pay | Admitting: Family Medicine

## 2017-08-07 ENCOUNTER — Other Ambulatory Visit: Payer: Self-pay | Admitting: Family Medicine

## 2017-08-07 DIAGNOSIS — G8929 Other chronic pain: Secondary | ICD-10-CM

## 2017-08-07 DIAGNOSIS — M545 Low back pain: Principal | ICD-10-CM

## 2017-08-07 MED ORDER — OXYCODONE-ACETAMINOPHEN 7.5-325 MG PO TABS: 1.0000 | ORAL_TABLET | ORAL | 0 refills | Status: DC | PRN

## 2017-08-07 NOTE — Telephone Encounter (Signed)
Pt contacted office for refill request on the following medications:  oxyCODONE-acetaminophen (PERCOCET) 7.5-325 MG tablet  [facility]  Please advise. Thanks TNP

## 2017-08-08 ENCOUNTER — Encounter: Payer: Self-pay | Admitting: Emergency Medicine

## 2017-08-08 ENCOUNTER — Ambulatory Visit (INDEPENDENT_AMBULATORY_CARE_PROVIDER_SITE_OTHER): Payer: BLUE CROSS/BLUE SHIELD | Admitting: Family Medicine

## 2017-08-08 ENCOUNTER — Encounter: Payer: Self-pay | Admitting: Family Medicine

## 2017-08-08 VITALS — BP 122/64 | HR 84 | Temp 97.8°F | Resp 18 | Wt 399.0 lb

## 2017-08-08 DIAGNOSIS — F329 Major depressive disorder, single episode, unspecified: Secondary | ICD-10-CM

## 2017-08-08 DIAGNOSIS — F32A Depression, unspecified: Secondary | ICD-10-CM

## 2017-08-08 DIAGNOSIS — F419 Anxiety disorder, unspecified: Secondary | ICD-10-CM | POA: Insufficient documentation

## 2017-08-08 MED ORDER — VENLAFAXINE HCL ER 37.5 MG PO CP24
37.5000 mg | ORAL_CAPSULE | Freq: Every day | ORAL | 0 refills | Status: DC
Start: 2017-08-08 — End: 2017-08-25

## 2017-08-08 NOTE — Progress Notes (Signed)
Patient: Raymond Rose Male    DOB: 1975/10/07   42 y.o.   MRN: [id number] Visit Date: 08/08/2017  Today's Provider: [name], MD   Chief Complaint  Patient presents with  . Anxiety   Subjective:    HPI Pt is here today because he is having more anxiety than depression. He was seen on 08/01/17 and prescribe Wellbutrin XL 150mg . He reports that he has just felt "off" all week and felt like she did not want to leave the house. He took the first dose on Monday. He got up Tuesday morning and showered and go ready for work, but was so anxious about leaving the house he didn't anxiously go. He reports that he does not feel as sad but much more anxious. He had thoughts of hurting his self prior to last office visit but none since then.      Allergies  Allergen Reactions  . Morphine      Current Outpatient Medications:  .  buPROPion (WELLBUTRIN XL) 150 MG 24 hr tablet, Take one tablet daily for two weeks, then increase to 2 tablets daily as needed, Disp: 30 tablet, Rfl: 1 .  escitalopram (LEXAPRO) 20 MG tablet, Take 1 tablet (20 mg total) by mouth daily., Disp: 90 tablet, Rfl: 1 .  glipiZIDE (GLUCOTROL XL) 5 MG 24 hr tablet, TAKE 1 TABLET (5 MG TOTAL) BY MOUTH DAILY WITH BREAKFAST., Disp: 30 tablet, Rfl: 11 .  glucose blood (ONETOUCH VERIO) test strip, 1 strip daily. Reported on 10/20/2015, Disp: , Rfl:  .  ketoconazole (NIZORAL) 2 % cream, APPLY TO AFFECTED AREA ONCE DAILY FOR 7 DAYS AS NEEDED, Disp: 15 g, Rfl: 0 .  metFORMIN (GLUCOPHAGE-XR) 500 MG 24 hr tablet, TAKE 2 TABLETS BY MOUTH TWICE A DAY, Disp: 120 tablet, Rfl: 3 .  omeprazole (PRILOSEC) 40 MG capsule, TAKE 1 CAPSULE (40 MG TOTAL) BY MOUTH DAILY., Disp: 30 capsule, Rfl: 2 .  oxyCODONE-acetaminophen (PERCOCET) 7.5-325 MG tablet, Take 1 tablet by mouth every 4 (four) hours as needed for severe pain., Disp: 60 tablet, Rfl: 0  Review of Systems  Constitutional: Negative.   HENT: Negative.   Eyes: Negative.     Respiratory: Negative.   Cardiovascular: Negative.   Gastrointestinal: Negative.   Endocrine: Negative.   Genitourinary: Negative.   Musculoskeletal: Negative.   Skin: Negative.   Allergic/Immunologic: Negative.   Neurological: Negative.   Hematological: Negative.   Psychiatric/Behavioral: Positive for dysphoric mood and suicidal ideas. The patient is nervous/anxious.     Social History   Tobacco Use  . Smoking status: Former Smoker    Years: 5.00    Last attempt to quit: 06/17/1997    Years since quitting: 20.1  . Smokeless tobacco: Never Used  . Tobacco comment: started smoking at [age]he 16, and quit at age 77  Substance Use Topics  . Alcohol use: Yes    Alcohol/week: 0.0 oz    Comment: occasional alcohol use   Objective:   BP 122/64 (BP Location: Left Arm, Patient Position: Sitting, Cuff Size: Large)   Pulse 84   Temp 97.8 F (36.6 C) (Oral)   Resp 18   Wt (!) 399 lb (181 kg)   SpO2 98%   BMI 54.11 kg/m  Vitals:   08/08/17 1443  BP: 122/64  Pulse: 84  Resp: 18  Temp: 97.8 F (36.6 C)  TempSrc: Oral  SpO2: 98%  Weight: (!) 399 lb (181 kg)     Physical Exam  General appearance: alert, well developed, well nourished, cooperative and in no distress Head: Normocephalic, without obvious abnormality, atraumatic Respiratory: Respirations even and unlabored, normal respiratory rate Extremities: No gross deformities Skin: Skin color, texture, turgor normal. No rashes seen  Psych: Appropriate mood and affect. Neurologic: Mental status: Alert, oriented to person, place, and time, thought content appropriate.     Assessment & Plan:     1. Depression, unspecified depression type Improved with addition of bupropion, but not tolerating due to extreme anxiety which he states has made him unable to leave house to go to work. He still wants something else to help with depression. Will wean escitalopram and start and titrate up  venlafaxine XR (EFFEXOR XR) 37.5 MG 24 hr  capsule; Take 1 capsule (37.5 mg total) by mouth daily with breakfast. Increase to 2 tablets daily after 1 week  Dispense: 30 capsule; Refill: 0  2. Anxiety  - venlafaxine XR (EFFEXOR XR) 37.5 MG 24 hr capsule; Take 1 capsule (37.5 mg total) by mouth daily with breakfast. Increase to 2 tablets daily after 1 week  Dispense: 30 capsule; Refill: 0  He is to call before prescription runs out and let me know if needs to titrated further up on dose of venlafaxine.        [name], MD  [facility]

## 2017-08-08 NOTE — Patient Instructions (Signed)
   Take 1 (ONE) tablet Effexor 37.5mg  daily for 7 days, then increase to 2 (TWO) tablet every morning   Reduce escitalopram to 1/2 tablet daily for 7 days, then discontinue   Call before first prescription of Effexor runs out and let me know if 75mg  a day is working well, or if we need to increase to 150mg .

## 2017-08-25 ENCOUNTER — Other Ambulatory Visit: Payer: Self-pay | Admitting: Family Medicine

## 2017-08-25 DIAGNOSIS — F32A Depression, unspecified: Secondary | ICD-10-CM

## 2017-08-25 DIAGNOSIS — F329 Major depressive disorder, single episode, unspecified: Secondary | ICD-10-CM

## 2017-08-25 DIAGNOSIS — M545 Low back pain: Secondary | ICD-10-CM

## 2017-08-25 DIAGNOSIS — N5089 Other specified disorders of the male genital organs: Secondary | ICD-10-CM

## 2017-08-25 DIAGNOSIS — F419 Anxiety disorder, unspecified: Secondary | ICD-10-CM

## 2017-08-25 DIAGNOSIS — G8929 Other chronic pain: Secondary | ICD-10-CM

## 2017-08-25 MED ORDER — KETOCONAZOLE 2 % EX CREA: TOPICAL_CREAM | CUTANEOUS | 3 refills | Status: DC

## 2017-08-25 MED ORDER — OXYCODONE-ACETAMINOPHEN 7.5-325 MG PO TABS: 1.0000 | ORAL_TABLET | ORAL | 0 refills | Status: DC | PRN

## 2017-08-25 MED ORDER — VENLAFAXINE HCL ER 37.5 MG PO CP24: 37.5000 mg | ORAL_CAPSULE | Freq: Every day | ORAL | 3 refills | Status: DC

## 2017-08-25 NOTE — Telephone Encounter (Signed)
Pt contacted office for refill request on the following medications:  1. venlafaxine XR (EFFEXOR XR) 37.5 MG 24 hr capsule (pt stated he needed the Rx to be written for 75 mg since he is taking twice a day 37.5 X 2=75 mg) 2. oxyCODONE-acetaminophen (PERCOCET) 7.5-325 MG tablet 3. ketoconazole (NIZORAL) 2 % cream   [facility]   Please advise. Thanks TNP

## 2017-08-29 ENCOUNTER — Telehealth: Payer: Self-pay | Admitting: Family Medicine

## 2017-08-29 DIAGNOSIS — F32A Depression, unspecified: Secondary | ICD-10-CM

## 2017-08-29 DIAGNOSIS — F419 Anxiety disorder, unspecified: Secondary | ICD-10-CM

## 2017-08-29 DIAGNOSIS — F329 Major depressive disorder, single episode, unspecified: Secondary | ICD-10-CM

## 2017-08-29 MED ORDER — VENLAFAXINE HCL ER 75 MG PO CP24: 75.0000 mg | ORAL_CAPSULE | Freq: Every day | ORAL | 1 refills | Status: DC

## 2017-08-29 NOTE — Telephone Encounter (Signed)
Patient states that the wrong dose of venlafaxine was sent to the pharmacy.  He states that you doubled his dose so it should be 75mg .  [facility] [place]

## 2017-08-29 NOTE — Telephone Encounter (Signed)
Please advise 

## 2017-09-10 ENCOUNTER — Ambulatory Visit (INDEPENDENT_AMBULATORY_CARE_PROVIDER_SITE_OTHER): Payer: BLUE CROSS/BLUE SHIELD | Admitting: Family Medicine

## 2017-09-10 ENCOUNTER — Encounter: Payer: Self-pay | Admitting: Family Medicine

## 2017-09-10 VITALS — BP 130/82 | HR 80 | Temp 98.4°F | Resp 16 | Wt 389.0 lb

## 2017-09-10 DIAGNOSIS — G43A0 Cyclical vomiting, not intractable: Secondary | ICD-10-CM

## 2017-09-10 DIAGNOSIS — E279 Disorder of adrenal gland, unspecified: Secondary | ICD-10-CM | POA: Diagnosis not present

## 2017-09-10 DIAGNOSIS — E278 Other specified disorders of adrenal gland: Secondary | ICD-10-CM

## 2017-09-10 DIAGNOSIS — R1115 Cyclical vomiting syndrome unrelated to migraine: Secondary | ICD-10-CM

## 2017-09-10 NOTE — Patient Instructions (Addendum)
   Reduce Effexor to 37.5 mg a day for the next few days to see if this helps with nausea and vomiting   You should get a call from [facility] about scheduling a follow up MRI for the adrenal mass

## 2017-09-10 NOTE — Progress Notes (Signed)
Patient: Raymond Rose Male    DOB: 05/29/1976   42 y.o.   MRN: [id number] Visit Date: 09/10/2017  Today's Provider: [name], MD   Chief Complaint  Patient presents with  . Nausea   Subjective:    HPI Nausea and vomiting: Patient comes in reporting that for the past 2 weeks he has had daily episode of nausea and vomiting. He has also had belching and abdominal pain. The abdominal pain is located in the epigastric region and is characterized as a achy pain.  Patient reports he has had some changes in his bowel habits to where his stools are more loose. Patient denies any fevers, body aches or chills. No relation ship to eating.  His antidepressant was changed to venlafaxine on 08/08/2017 starting at 37.5 a day and increased to 2 a day a week later. He states his mood has been much better, but he is not sure how long after starting current dose that nausea started.     Allergies  Allergen Reactions  . Bupropion     Extreme anxiety  . Morphine      Current Outpatient Medications:  .  glipiZIDE (GLUCOTROL XL) 5 MG 24 hr tablet, TAKE 1 TABLET (5 MG TOTAL) BY MOUTH DAILY WITH BREAKFAST., Disp: 30 tablet, Rfl: 11 .  glucose blood (ONETOUCH VERIO) test strip, 1 strip daily. Reported on 10/20/2015, Disp: , Rfl:  .  ketoconazole (NIZORAL) 2 % cream, APPLY TO AFFECTED AREA ONCE DAILY FOR 7 DAYS AS NEEDED, Disp: 15 g, Rfl: 3 .  metFORMIN (GLUCOPHAGE-XR) 500 MG 24 hr tablet, TAKE 2 TABLETS BY MOUTH TWICE A DAY, Disp: 120 tablet, Rfl: 3 .  omeprazole (PRILOSEC) 40 MG capsule, TAKE 1 CAPSULE (40 MG TOTAL) BY MOUTH DAILY., Disp: 30 capsule, Rfl: 2 .  oxyCODONE-acetaminophen (PERCOCET) 7.5-325 MG tablet, Take 1 tablet by mouth every 4 (four) hours as needed for severe pain., Disp: 60 tablet, Rfl: 0 .  venlafaxine XR (EFFEXOR-XR) 75 MG 24 hr capsule, Take 1 capsule (75 mg total) by mouth daily with breakfast., Disp: 30 capsule, Rfl: 1 .  buPROPion (WELLBUTRIN XL) 150 MG 24 hr tablet,  Take one tablet daily for two weeks, then increase to 2 tablets daily as needed (Patient not taking: Reported on 09/10/2017), Disp: 30 tablet, Rfl: 1  Review of Systems  Constitutional: Negative for appetite change, chills and fever.  Respiratory: Negative for chest tightness, shortness of breath and wheezing.   Cardiovascular: Negative for chest pain and palpitations.  Gastrointestinal: Positive for abdominal distention, abdominal pain, nausea and vomiting. Negative for blood in stool and constipation.    Social History   Tobacco Use  . Smoking status: Former Smoker    Years: 5.00    Last attempt to quit: 06/17/1997    Years since quitting: 20.2  . Smokeless tobacco: Never Used  . Tobacco comment: started smoking at [age]he 16, and quit at age 84  Substance Use Topics  . Alcohol use: Yes    Alcohol/week: 0.0 oz    Comment: occasional alcohol use   Objective:   BP 130/82 (BP Location: Left Arm, Patient Position: Sitting, Cuff Size: Large)   Pulse 80   Temp 98.4 F (36.9 C) (Oral)   Resp 16   Wt (!) 389 lb (176.4 kg)   SpO2 97% Comment: room air  BMI 52.76 kg/m  There were no vitals filed for this visit.   Physical Exam  General Appearance:  Alert, cooperative, no distress  Eyes:    PERRL, conjunctiva/corneas clear, EOM's intact       Lungs:     Clear to auscultation bilaterally, respirations unlabored  Heart:    Regular rate and rhythm  Abdomen:   bowel sounds present and normal in all 4 quadrants, soft, round. Slight LUQ tenderness. No epigastric tenderness.         Assessment & Plan:     1. Non-intractable cyclical vomiting with nausea He has recently titrated up dose of of venlafaxine to 2 x 37.5mg  a day. This could be adverse effect and advised to reduce to one tablet a day. Check labs. Consider checking h. Pylori if labs normal and reducing dose of venlafaxine does not help - CBC - Comprehensive metabolic panel - Amylase  2. 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 Will go ahead and arrange for this study.        [name], MD  [facility]

## 2017-09-11 LAB — CBC
Hematocrit: 43.4 % (ref 37.5–51.0)
Hemoglobin: 14.2 g/dL (ref 13.0–17.7)
MCH: 28.2 pg (ref 26.6–33.0)
MCHC: 32.7 g/dL (ref 31.5–35.7)
MCV: 86 fL (ref 79–97)
Platelets: 292 10*3/uL (ref 150–379)
RBC: 5.04 x10E6/uL (ref 4.14–5.80)
RDW: 14.9 % (ref 12.3–15.4)
WBC: 8.5 10*3/uL (ref 3.4–10.8)

## 2017-09-11 LAB — COMPREHENSIVE METABOLIC PANEL
ALT: 34 IU/L (ref 0–44)
AST: 33 IU/L (ref 0–40)
Albumin/Globulin Ratio: 1.6 (ref 1.2–2.2)
Albumin: 4.5 g/dL (ref 3.5–5.5)
Alkaline Phosphatase: 85 IU/L (ref 39–117)
BUN/Creatinine Ratio: 13 (ref 9–20)
BUN: 10 mg/dL (ref 6–20)
Bilirubin Total: 0.3 mg/dL (ref 0.0–1.2)
CO2: 20 mmol/L (ref 20–29)
Calcium: 9.9 mg/dL (ref 8.7–10.2)
Chloride: 97 mmol/L (ref 96–106)
Creatinine, Ser: 0.78 mg/dL (ref 0.76–1.27)
GFR calc Af Amer: 131 mL/min/{1.73_m2} (ref >59)
GFR calc non Af Amer: 114 mL/min/{1.73_m2} (ref >59)
Globulin, Total: 2.9 g/dL (ref 1.5–4.5)
Glucose: 243 mg/dL — ABNORMAL HIGH (ref 65–99)
Potassium: 4.4 mmol/L (ref 3.5–5.2)
Sodium: 140 mmol/L (ref 134–144)
Total Protein: 7.4 g/dL (ref 6.0–8.5)

## 2017-09-11 LAB — AMYLASE: Amylase: 21 U/L — ABNORMAL LOW (ref 31–124)

## 2017-09-13 ENCOUNTER — Other Ambulatory Visit: Payer: Self-pay | Admitting: Family Medicine

## 2017-09-13 DIAGNOSIS — E119 Type 2 diabetes mellitus without complications: Secondary | ICD-10-CM

## 2017-09-15 ENCOUNTER — Other Ambulatory Visit: Payer: Self-pay | Admitting: Family Medicine

## 2017-09-15 DIAGNOSIS — M545 Low back pain: Principal | ICD-10-CM

## 2017-09-15 DIAGNOSIS — G8929 Other chronic pain: Secondary | ICD-10-CM

## 2017-09-15 MED ORDER — OXYCODONE-ACETAMINOPHEN 7.5-325 MG PO TABS: 1.0000 | ORAL_TABLET | ORAL | 0 refills | Status: DC | PRN

## 2017-09-15 NOTE — Telephone Encounter (Signed)
Patient needs refill on Oxycodone 7.5-325 mg  [facility]

## 2017-09-16 ENCOUNTER — Telehealth: Payer: Self-pay

## 2017-09-16 DIAGNOSIS — R1084 Generalized abdominal pain: Secondary | ICD-10-CM

## 2017-09-16 DIAGNOSIS — R1013 Epigastric pain: Secondary | ICD-10-CM

## 2017-09-16 DIAGNOSIS — R112 Nausea with vomiting, unspecified: Secondary | ICD-10-CM

## 2017-09-16 NOTE — Telephone Encounter (Signed)
Patient called to follow up on referral for MRI. Patient advised that we are waiting on authorization.  CB# [id number].

## 2017-09-17 ENCOUNTER — Telehealth: Payer: Self-pay | Admitting: Family Medicine

## 2017-09-17 NOTE — Telephone Encounter (Signed)
MRI was denied by [employer] and pt wants to know what is next step.He is still not feeling well

## 2017-09-17 NOTE — Telephone Encounter (Signed)
FYI--MRI of abdomen was denied by insurance

## 2017-09-17 NOTE — Telephone Encounter (Signed)
Please advise 

## 2017-09-18 ENCOUNTER — Other Ambulatory Visit: Payer: Self-pay | Admitting: *Deleted

## 2017-09-18 ENCOUNTER — Telehealth: Payer: Self-pay | Admitting: Family Medicine

## 2017-09-18 DIAGNOSIS — R1084 Generalized abdominal pain: Secondary | ICD-10-CM

## 2017-09-18 DIAGNOSIS — R112 Nausea with vomiting, unspecified: Secondary | ICD-10-CM

## 2017-09-18 MED ORDER — ONDANSETRON HCL 4 MG PO TABS: 4.0000 mg | ORAL_TABLET | Freq: Three times a day (TID) | ORAL | 0 refills | Status: DC | PRN

## 2017-09-18 NOTE — Telephone Encounter (Signed)
Please advise 

## 2017-09-18 NOTE — Telephone Encounter (Signed)
Patient was advised. Rx sent to pharmacy. CT scan and h. pylori test ordered.

## 2017-09-18 NOTE — Telephone Encounter (Signed)
Insurance won't cover MRI.  Need to come in for h. Pylori breath test Can send in prescription of ondansetron 4mg  once every 8 hours for nausea (#20, no refills)  Since insurance won't cover MRI need to order CT abdomen without contrast for nausea, vomiting and abdominal pain

## 2017-09-18 NOTE — Telephone Encounter (Signed)
Pt called back today saying he is still throwing up daily and feeling really bad.  He wants to know if we have heard anything about getting the MRI test done.  If not he said he is going to have to do something to help with the nausea and vomiting  His call back Is [phone]  Thanks Con Memos

## 2017-09-18 NOTE — Telephone Encounter (Signed)
Please schedule CT scan. Thanks! 

## 2017-09-18 NOTE — Telephone Encounter (Signed)
Insurance company will not approve CT of abdomen without peer to peer but will approve CT abd/pelvis with contrast.Procedure code [phone].

## 2017-09-19 NOTE — Telephone Encounter (Signed)
OK for CT abdomen and pelvis with contrast.

## 2017-09-19 NOTE — Telephone Encounter (Signed)
Order in epic. 

## 2017-09-19 NOTE — Telephone Encounter (Signed)
Please place order in Epic,Thanks

## 2017-09-23 ENCOUNTER — Ambulatory Visit
Admission: RE | Admit: 2017-09-23 | Discharge: 2017-09-23 | Disposition: A | Discharge status: Home / Self Care | Payer: BLUE CROSS/BLUE SHIELD | Source: Ambulatory Visit | Attending: Family Medicine | Admitting: Family Medicine

## 2017-09-23 ENCOUNTER — Telehealth: Payer: Self-pay

## 2017-09-23 DIAGNOSIS — R1084 Generalized abdominal pain: Secondary | ICD-10-CM | POA: Diagnosis not present

## 2017-09-23 DIAGNOSIS — D1779 Benign lipomatous neoplasm of other sites: Secondary | ICD-10-CM | POA: Diagnosis not present

## 2017-09-23 DIAGNOSIS — K760 Fatty (change of) liver, not elsewhere classified: Secondary | ICD-10-CM | POA: Diagnosis not present

## 2017-09-23 LAB — POCT I-STAT CREATININE: Creatinine, Ser: 0.6 mg/dL — ABNORMAL LOW (ref 0.61–1.24)

## 2017-09-23 MED ORDER — IOPAMIDOL (ISOVUE-370) INJECTION 76%
100.0000 mL | Freq: Once | INTRAVENOUS | Status: AC | PRN
  Administered 2017-09-23: 100 mL via INTRAVENOUS

## 2017-09-23 NOTE — Telephone Encounter (Signed)
-----   Message from Birdie Sons, MD sent at 09/23/2017  3:35 PM EDT ----- Ct abdomen showed that adrenal mass is unchanged, no additional follow up is needed. Otherwise normal. If still has nausea and vomiting then he should stop Effexor for 3 days. If still not better will need referral to GI.

## 2017-09-23 NOTE — Telephone Encounter (Signed)
Pt advised. He will wait for H. Pylori results before trial of Effexor discontinuation.

## 2017-09-24 ENCOUNTER — Other Ambulatory Visit: Payer: Self-pay | Admitting: Family Medicine

## 2017-09-24 DIAGNOSIS — R112 Nausea with vomiting, unspecified: Secondary | ICD-10-CM

## 2017-09-24 LAB — H. PYLORI BREATH TEST: H pylori Breath Test: NEGATIVE

## 2017-10-03 ENCOUNTER — Ambulatory Visit (INDEPENDENT_AMBULATORY_CARE_PROVIDER_SITE_OTHER): Payer: BLUE CROSS/BLUE SHIELD | Admitting: Family Medicine

## 2017-10-03 ENCOUNTER — Encounter: Payer: Self-pay | Admitting: Family Medicine

## 2017-10-03 VITALS — BP 134/88 | HR 83 | Temp 98.1°F | Resp 18 | Wt 385.0 lb

## 2017-10-03 DIAGNOSIS — F329 Major depressive disorder, single episode, unspecified: Secondary | ICD-10-CM

## 2017-10-03 DIAGNOSIS — F32A Depression, unspecified: Secondary | ICD-10-CM

## 2017-10-03 DIAGNOSIS — G8929 Other chronic pain: Secondary | ICD-10-CM | POA: Diagnosis not present

## 2017-10-03 DIAGNOSIS — N529 Male erectile dysfunction, unspecified: Secondary | ICD-10-CM | POA: Diagnosis not present

## 2017-10-03 DIAGNOSIS — F410 Panic disorder [episodic paroxysmal anxiety] without agoraphobia: Secondary | ICD-10-CM | POA: Diagnosis not present

## 2017-10-03 DIAGNOSIS — R112 Nausea with vomiting, unspecified: Secondary | ICD-10-CM | POA: Diagnosis not present

## 2017-10-03 DIAGNOSIS — M545 Low back pain: Secondary | ICD-10-CM | POA: Diagnosis not present

## 2017-10-03 MED ORDER — SILDENAFIL CITRATE 50 MG PO TABS: ORAL_TABLET | ORAL | 0 refills | Status: DC

## 2017-10-03 MED ORDER — OXYCODONE-ACETAMINOPHEN 7.5-325 MG PO TABS: 1.0000 | ORAL_TABLET | ORAL | 0 refills | Status: DC | PRN

## 2017-10-03 MED ORDER — ESCITALOPRAM OXALATE 20 MG PO TABS: 20.0000 mg | ORAL_TABLET | Freq: Every day | ORAL | 5 refills | Status: DC

## 2017-10-03 NOTE — Progress Notes (Signed)
Patient: Raymond Rose Male    DOB: 1976-04-20   42 y.o.   MRN: [id number] Visit Date: 10/03/2017  Today's Provider: [name], MD   Chief Complaint  Patient presents with  . Nausea  . Depression   Subjective:    HPI  Depression, unspecified depression type From 08/08/2017-weaned escitalopram and started, titrated up venlafaxine XR (EFFEXOR XR) 37.5 MG 24 hr cap. Patient reports good compliance with treatment, but poor tolerance. He believes the Effexor is making him nauseous. Patient wants to change back to the Lexapro.  He had been on Lexapro for over a year and was tolerating well, but had seemed to loose its effectiveness prompting change to Effexor. Previously tried adding bupropion which made him much more anxious. He states that he stopped Effexor for a couple of days last week but had a panic attack on Friday so has since restarted it.   Non-intractable cyclical vomiting with nausea From 09/10/2017-patient advised to reduce venlafaxine to 1 tablet qd. Labs checked, showing normal. Given rx for ondansetron. Patient was referred to GI. Today patient comes in reporting that he is still having nausea. He believes it may be coming from the Effexor and wishes to change medications. He did see GI in [place] earlier this week and is scheduled for EGD  He also requests medication for ED, stating he doesn't have much interest in sex and has difficulty maintaining an erection.     Allergies  Allergen Reactions  . Bupropion     Extreme anxiety  . Morphine      Current Outpatient Medications:  .  glipiZIDE (GLUCOTROL XL) 5 MG 24 hr tablet, TAKE 1 TABLET (5 MG TOTAL) BY MOUTH DAILY WITH BREAKFAST., Disp: 30 tablet, Rfl: 11 .  glucose blood (ONETOUCH VERIO) test strip, 1 strip daily. Reported on 10/20/2015, Disp: , Rfl:  .  ketoconazole (NIZORAL) 2 % cream, APPLY TO AFFECTED AREA ONCE DAILY FOR 7 DAYS AS NEEDED, Disp: 15 g, Rfl: 3 .  metFORMIN (GLUCOPHAGE-XR) 500 MG 24 hr  tablet, TAKE 2 TABLETS BY MOUTH TWICE A DAY, Disp: 120 tablet, Rfl: 3 .  omeprazole (PRILOSEC) 40 MG capsule, TAKE 1 CAPSULE (40 MG TOTAL) BY MOUTH DAILY., Disp: 30 capsule, Rfl: 2 .  ondansetron (ZOFRAN) 4 MG tablet, Take 1 tablet (4 mg total) by mouth every 8 (eight) hours as needed for nausea or vomiting., Disp: 20 tablet, Rfl: 0 .  oxyCODONE-acetaminophen (PERCOCET) 7.5-325 MG tablet, Take 1 tablet by mouth every 4 (four) hours as needed for severe pain., Disp: 60 tablet, Rfl: 0 .  venlafaxine XR (EFFEXOR-XR) 75 MG 24 hr capsule, Take 1 capsule (75 mg total) by mouth daily with breakfast., Disp: 30 capsule, Rfl: 1  Review of Systems  Constitutional: Negative for appetite change, chills and fever.  Respiratory: Negative for chest tightness, shortness of breath and wheezing.   Cardiovascular: Negative for chest pain and palpitations.  Gastrointestinal: Positive for nausea. Negative for abdominal pain and vomiting.  Psychiatric/Behavioral: The patient is nervous/anxious.     Social History   Tobacco Use  . Smoking status: Former Smoker    Years: 5.00    Last attempt to quit: 06/17/1997    Years since quitting: 20.3  . Smokeless tobacco: Never Used  . Tobacco comment: started smoking at [age]he 16, and quit at age 22  Substance Use Topics  . Alcohol use: Yes    Alcohol/week: 0.0 oz    Comment: occasional alcohol use  Objective:   BP 134/88 (BP Location: Left Arm, Patient Position: Sitting, Cuff Size: Large)   Pulse 83   Temp 98.1 F (36.7 C) (Oral)   Resp 18   Wt (!) 385 lb (174.6 kg)   SpO2 96% Comment: room air  BMI 52.22 kg/m     Physical Exam  General appearance: alert, well developed, well nourished, cooperative and in no distress Head: Normocephalic, without obvious abnormality, atraumatic Respiratory: Respirations even and unlabored, normal respiratory rate Extremities: No gross deformities Skin: Skin color, texture, turgor normal. No rashes seen  Psych: Appropriate  mood and affect. Neurologic: Mental status: Alert, oriented to person, place, and time, thought content appropriate.     Assessment & Plan:     1. Depression, unspecified depression type He was tolerating escitalopram well, will change back due to onset of persistent nausea and vomiting since change to venlafaxine. If N&V resolve will consider trial of high dose sertraline.  - escitalopram (LEXAPRO) 20 MG tablet; Take 1 tablet (20 mg total) by mouth daily.  Dispense: 30 tablet; Refill: 5  2. Panic attacks Change from venlafaxine back to - escitalopram (LEXAPRO) 20 MG tablet; Take 1 tablet (20 mg total) by mouth daily.  Dispense: 30 tablet; Refill: 5 as above.   3. Nausea and vomiting, intractability of vomiting not specified, unspecified vomiting type Possible due to venlafaxine, although he is now on a fairly low dose, d/c and start back on escitalopram as above. Is scheduled for EGD in [place] next week.   4. Erectile dysfunction, unspecified erectile dysfunction type tri- sildenafil (VIAGRA) 50 MG tablet; Take one or two as needed, no more than 2 in a day  Dispense: 10 tablet; Refill: 0  5. Chronic low back pain, unspecified back pain laterality, with sciatica presence unspecified refill- oxyCODONE-acetaminophen (PERCOCET) 7.5-325 MG tablet; Take 1 tablet by mouth every 4 (four) hours as needed for severe pain.  Dispense: 60 tablet; Refill: 0        [name], MD  [facility]

## 2017-10-06 ENCOUNTER — Telehealth: Payer: Self-pay | Admitting: Family Medicine

## 2017-10-06 NOTE — Telephone Encounter (Signed)
OK for work excuse on requested dates.

## 2017-10-06 NOTE — Telephone Encounter (Signed)
Please Advise if ok to write note to excuse patient from work for the requested days.

## 2017-10-06 NOTE — Telephone Encounter (Signed)
Pt is requesting a work note to excuse him from work from 4/16-4/19/19. Pt stated that he saw Dr. Caryn Section for OV on 10/03/17 for the same reason he had missed work. Please advise. Thanks TNP

## 2017-10-07 ENCOUNTER — Encounter: Payer: Self-pay | Admitting: *Deleted

## 2017-10-07 NOTE — Telephone Encounter (Signed)
Letter printed and ready to pickup. Patient was advised.

## 2017-10-08 ENCOUNTER — Other Ambulatory Visit: Payer: Self-pay | Admitting: Gastroenterology

## 2017-10-08 DIAGNOSIS — R112 Nausea with vomiting, unspecified: Secondary | ICD-10-CM

## 2017-10-22 ENCOUNTER — Other Ambulatory Visit: Payer: Self-pay | Admitting: Family Medicine

## 2017-10-22 DIAGNOSIS — G8929 Other chronic pain: Secondary | ICD-10-CM

## 2017-10-22 DIAGNOSIS — M545 Low back pain: Principal | ICD-10-CM

## 2017-10-22 MED ORDER — OXYCODONE-ACETAMINOPHEN 7.5-325 MG PO TABS: 1.0000 | ORAL_TABLET | ORAL | 0 refills | Status: DC | PRN

## 2017-10-22 NOTE — Telephone Encounter (Signed)
Pt needs refill on his percocet 7.5-325  [facility]  Thanks teri

## 2017-10-27 ENCOUNTER — Ambulatory Visit
Admission: RE | Admit: 2017-10-27 | Discharge: 2017-10-27 | Disposition: A | Discharge status: Home / Self Care | Payer: BLUE CROSS/BLUE SHIELD | Source: Ambulatory Visit | Attending: Gastroenterology | Admitting: Gastroenterology

## 2017-10-27 DIAGNOSIS — R112 Nausea with vomiting, unspecified: Secondary | ICD-10-CM | POA: Diagnosis not present

## 2017-10-27 MED ORDER — TECHNETIUM TC 99M SULFUR COLLOID
2.3700 | Freq: Once | INTRAVENOUS | Status: AC | PRN
  Administered 2017-10-27: 2.37 via INTRAVENOUS

## 2017-10-30 ENCOUNTER — Ambulatory Visit: Payer: Self-pay | Admitting: Family Medicine

## 2017-11-17 ENCOUNTER — Other Ambulatory Visit: Payer: Self-pay | Admitting: Family Medicine

## 2017-11-17 DIAGNOSIS — G8929 Other chronic pain: Secondary | ICD-10-CM

## 2017-11-17 DIAGNOSIS — M545 Low back pain: Principal | ICD-10-CM

## 2017-11-17 MED ORDER — OXYCODONE-ACETAMINOPHEN 7.5-325 MG PO TABS: 1.0000 | ORAL_TABLET | ORAL | 0 refills | Status: DC | PRN

## 2017-11-17 NOTE — Telephone Encounter (Signed)
Pt contacted office for refill request on the following medications:  oxyCODONE-acetaminophen (PERCOCET) 7.5-325 MG tablet  [facility]  Last Rx: 10/22/17 LOV: 10/03/17 Please advise. Thanks TNP

## 2017-11-18 ENCOUNTER — Ambulatory Visit: Payer: BLUE CROSS/BLUE SHIELD | Admitting: Gastroenterology

## 2017-11-25 ENCOUNTER — Other Ambulatory Visit: Payer: Self-pay | Admitting: Family Medicine

## 2017-12-03 ENCOUNTER — Telehealth: Payer: Self-pay | Admitting: Family Medicine

## 2017-12-03 DIAGNOSIS — G8929 Other chronic pain: Secondary | ICD-10-CM

## 2017-12-03 DIAGNOSIS — M545 Low back pain: Principal | ICD-10-CM

## 2017-12-03 MED ORDER — FLUCONAZOLE 150 MG PO TABS: 150.0000 mg | ORAL_TABLET | Freq: Once | ORAL | 0 refills | Status: DC

## 2017-12-03 MED ORDER — OXYCODONE-ACETAMINOPHEN 7.5-325 MG PO TABS: 1.0000 | ORAL_TABLET | ORAL | 0 refills | Status: DC | PRN

## 2017-12-03 NOTE — Telephone Encounter (Signed)
Pt contacted office for refill request on the following medications:  1. fluconazole (DIFLUCAN) 150 MG tablet  2. oxyCODONE-acetaminophen (PERCOCET) 7.5-325 MG tablet   [facility]  Please advise. Thanks TNP

## 2017-12-03 NOTE — Telephone Encounter (Signed)
Please advise? Patient was given fluconazole for thrush in May 2017.

## 2017-12-04 ENCOUNTER — Ambulatory Visit: Payer: Self-pay | Admitting: Family Medicine

## 2017-12-04 NOTE — Progress Notes (Deleted)
       Patient: Raymond Rose Male    DOB: Apr 13, 1976   42 y.o.   MRN: [id number] Visit Date: 12/04/2017  Today's Provider: [name], MD   No chief complaint on file.  Subjective:    HPI  Depression, unspecified depression type From 10/03/2017-changed back to escitalopram (LEXAPRO) 20 MG tablet due to onset of persistent nausea and vomiting since change to venlafaxine.  Panic attacks From 10/03/2017-Change from venlafaxine back to escitalopram (LEXAPRO) 20 MG tablet.  Erectile dysfunction, unspecified erectile dysfunction type From 10/03/2017-started trial of sildenafil (VIAGRA) 50 MG tablet.  Chronic low back pain, unspecified back pain laterality, with sciatica presence unspecified From 10/03/2017-no changes. Refilled oxyCODONE-acetaminophen (PERCOCET) 7.5-325 MG tablet.     Allergies  Allergen Reactions  . Bupropion     Extreme anxiety  . Morphine      Current Outpatient Medications:  .  escitalopram (LEXAPRO) 20 MG tablet, Take 1 tablet (20 mg total) by mouth daily., Disp: 30 tablet, Rfl: 5 .  glipiZIDE (GLUCOTROL XL) 5 MG 24 hr tablet, TAKE 1 TABLET (5 MG TOTAL) BY MOUTH DAILY WITH BREAKFAST., Disp: 30 tablet, Rfl: 11 .  glucose blood (ONETOUCH VERIO) test strip, 1 strip daily. Reported on 10/20/2015, Disp: , Rfl:  .  ketoconazole (NIZORAL) 2 % cream, APPLY TO AFFECTED AREA ONCE DAILY FOR 7 DAYS AS NEEDED, Disp: 15 g, Rfl: 3 .  metFORMIN (GLUCOPHAGE-XR) 500 MG 24 hr tablet, TAKE 2 TABLETS BY MOUTH TWICE A DAY, Disp: 120 tablet, Rfl: 3 .  omeprazole (PRILOSEC) 40 MG capsule, TAKE 1 CAPSULE (40 MG TOTAL) BY MOUTH DAILY., Disp: 30 capsule, Rfl: 11 .  ondansetron (ZOFRAN) 4 MG tablet, Take 1 tablet (4 mg total) by mouth every 8 (eight) hours as needed for nausea or vomiting., Disp: 20 tablet, Rfl: 0 .  oxyCODONE-acetaminophen (PERCOCET) 7.5-325 MG tablet, Take 1 tablet by mouth every 4 (four) hours as needed for severe pain., Disp: 60 tablet, Rfl: 0 .  sildenafil  (VIAGRA) 50 MG tablet, Take one or two as needed, no more than 2 in a day, Disp: 10 tablet, Rfl: 0  Review of Systems  Constitutional: Negative for appetite change, chills and fever.  Respiratory: Negative for chest tightness, shortness of breath and wheezing.   Cardiovascular: Negative for chest pain and palpitations.  Gastrointestinal: Negative for abdominal pain, nausea and vomiting.    Social History   Tobacco Use  . Smoking status: Former Smoker    Years: 5.00    Last attempt to quit: 06/17/1997    Years since quitting: 20.4  . Smokeless tobacco: Never Used  . Tobacco comment: started smoking at [age]he 16, and quit at age 69  Substance Use Topics  . Alcohol use: Yes    Alcohol/week: 0.0 oz    Comment: occasional alcohol use   Objective:   There were no vitals taken for this visit. There were no vitals filed for this visit.   Physical Exam      Assessment & Plan:           [name], MD  [facility]

## 2017-12-22 ENCOUNTER — Other Ambulatory Visit: Payer: Self-pay | Admitting: Family Medicine

## 2017-12-22 DIAGNOSIS — G8929 Other chronic pain: Secondary | ICD-10-CM

## 2017-12-22 DIAGNOSIS — M545 Low back pain: Principal | ICD-10-CM

## 2017-12-22 DIAGNOSIS — N5089 Other specified disorders of the male genital organs: Secondary | ICD-10-CM

## 2017-12-22 MED ORDER — OXYCODONE-ACETAMINOPHEN 7.5-325 MG PO TABS: 1.0000 | ORAL_TABLET | ORAL | 0 refills | Status: DC | PRN

## 2017-12-22 MED ORDER — KETOCONAZOLE 2 % EX CREA: TOPICAL_CREAM | CUTANEOUS | 3 refills | Status: DC

## 2017-12-22 NOTE — Telephone Encounter (Signed)
Pt needs refills on  Diflucan  Ketoconazole cream  Oxycodone  [facility]  Thanks teri

## 2017-12-24 ENCOUNTER — Other Ambulatory Visit: Payer: Self-pay | Admitting: Family Medicine

## 2018-01-02 ENCOUNTER — Ambulatory Visit (INDEPENDENT_AMBULATORY_CARE_PROVIDER_SITE_OTHER): Payer: BLUE CROSS/BLUE SHIELD | Admitting: Family Medicine

## 2018-01-02 ENCOUNTER — Encounter: Payer: Self-pay | Admitting: Family Medicine

## 2018-01-02 VITALS — BP 120/70 | HR 90 | Temp 98.3°F | Resp 16 | Wt 381.0 lb

## 2018-01-02 DIAGNOSIS — E119 Type 2 diabetes mellitus without complications: Secondary | ICD-10-CM

## 2018-01-02 DIAGNOSIS — R110 Nausea: Secondary | ICD-10-CM

## 2018-01-02 DIAGNOSIS — F410 Panic disorder [episodic paroxysmal anxiety] without agoraphobia: Secondary | ICD-10-CM | POA: Diagnosis not present

## 2018-01-02 LAB — POCT GLYCOSYLATED HEMOGLOBIN (HGB A1C)
Est. average glucose Bld gHb Est-mCnc: 226
Hemoglobin A1C: 9.5 % — AB (ref 4.0–5.6)

## 2018-01-02 MED ORDER — DAPAGLIFLOZIN PROPANEDIOL 5 MG PO TABS: 5.0000 mg | ORAL_TABLET | Freq: Every day | ORAL | 0 refills | Status: DC

## 2018-01-02 MED ORDER — METFORMIN HCL ER 500 MG PO TB24: 500.0000 mg | ORAL_TABLET | Freq: Two times a day (BID) | ORAL | 0 refills | Status: DC

## 2018-01-02 NOTE — Progress Notes (Signed)
Patient: Raymond Rose Male    DOB: Jan 14, 1976   42 y.o.   MRN: [id number] Visit Date: 01/02/2018  Today's Provider: [name], MD   Chief Complaint  Patient presents with  . Follow-up  . Anxiety   Subjective:    HPI  Panic attacks From 10/03/2017-Changed from venlafaxine back to - escitalopram (LEXAPRO) 20 MG tablet.  Patient states he is doing better on escitalopram than he was on venlafaxine. He still feel nauseate frequently. He also reports there has been some restructuring at his work and is being transferred to a different location. However he reports that such changes in his work environment evoke anxiety and panic attacks and doesn't feel he would be able to do his job if the transfer were carried out.   Follow up diabetes. Last a1c was 9.0 in February. Is complaint with medications. Not checking sugars regularly. No known hypoglycemia.    Allergies  Allergen Reactions  . Bupropion     Extreme anxiety  . Morphine      Current Outpatient Medications:  .  escitalopram (LEXAPRO) 20 MG tablet, Take 1 tablet (20 mg total) by mouth daily., Disp: 30 tablet, Rfl: 5 .  fluconazole (DIFLUCAN) 150 MG tablet, TAKE 1 TABLET BY MOUTH AS ONE DOSE, Disp: 1 tablet, Rfl: 0 .  glipiZIDE (GLUCOTROL XL) 5 MG 24 hr tablet, TAKE 1 TABLET (5 MG TOTAL) BY MOUTH DAILY WITH BREAKFAST., Disp: 30 tablet, Rfl: 11 .  glucose blood (ONETOUCH VERIO) test strip, 1 strip daily. Reported on 10/20/2015, Disp: , Rfl:  .  ketoconazole (NIZORAL) 2 % cream, APPLY TO AFFECTED AREA ONCE DAILY FOR 7 DAYS AS NEEDED, Disp: 15 g, Rfl: 3 .  metFORMIN (GLUCOPHAGE-XR) 500 MG 24 hr tablet, TAKE 2 TABLETS BY MOUTH TWICE A DAY, Disp: 120 tablet, Rfl: 3 .  omeprazole (PRILOSEC) 40 MG capsule, TAKE 1 CAPSULE (40 MG TOTAL) BY MOUTH DAILY., Disp: 30 capsule, Rfl: 11 .  ondansetron (ZOFRAN) 4 MG tablet, Take 1 tablet (4 mg total) by mouth every 8 (eight) hours as needed for nausea or vomiting., Disp: 20 tablet,  Rfl: 0 .  oxyCODONE-acetaminophen (PERCOCET) 7.5-325 MG tablet, Take 1 tablet by mouth every 4 (four) hours as needed for severe pain., Disp: 60 tablet, Rfl: 0 .  sildenafil (VIAGRA) 50 MG tablet, Take one or two as needed, no more than 2 in a day, Disp: 10 tablet, Rfl: 0  Review of Systems  Constitutional: Negative for appetite change, chills and fever.  Respiratory: Negative for chest tightness, shortness of breath and wheezing.   Cardiovascular: Negative for chest pain and palpitations.  Gastrointestinal: Negative for abdominal pain, nausea and vomiting.    Social History   Tobacco Use  . Smoking status: Former Smoker    Years: 5.00    Last attempt to quit: 06/17/1997    Years since quitting: 20.5  . Smokeless tobacco: Never Used  . Tobacco comment: started smoking at [age]he 16, and quit at age 24  Substance Use Topics  . Alcohol use: Yes    Alcohol/week: 0.0 oz    Comment: occasional alcohol use   Objective:   BP 120/70 (BP Location: Right Arm, Patient Position: Sitting, Cuff Size: Large)   Pulse 90   Temp 98.3 F (36.8 C) (Oral)   Resp 16   Wt (!) 381 lb (172.8 kg)   SpO2 98%   BMI 51.67 kg/m  Vitals:   01/02/18 1520  BP: 120/70  Pulse: 90  Resp: 16  Temp: 98.3 F (36.8 C)  TempSrc: Oral  SpO2: 98%  Weight: (!) 381 lb (172.8 kg)     Physical Exam  General appearance: alert, well developed, well nourished, cooperative and in no distress, morbidly obese Head: Normocephalic, without obvious abnormality, atraumatic Respiratory: Respirations even and unlabored, normal respiratory rate Extremities: No gross deformities Skin: Skin color, texture, turgor normal. No rashes seen  Psych: Appropriate mood and affect. Neurologic: Mental status: Alert, oriented to person, place, and time, thought content appropriate.     Assessment & Plan:     1. Type 2 diabetes mellitus without complication, without long-term current use of insulin (HCC) Reduce metformin due to nausea  and add farxiga.  - POCT glycosylated hemoglobin (Hb A1C) - dapagliflozin propanediol (FARXIGA) 5 MG TABS tablet; Take 5 mg by mouth daily.  Dispense: 28 tablet; Refill: 0 - metFORMIN (GLUCOPHAGE-XR) 500 MG 24 hr tablet; Take 1 tablet (500 mg total) by mouth 2 (two) times daily.  Dispense: 1 tablet; Refill: 0  2. Panic attacks Fairly well controlled on escitalopram. Letter to be written for reasonable accomodation for work that he should not be transferred to a new location which would exacerbate anxiety and panic attacks.   3. Nausea Reduce metformin to t00mg  twice a day.   Return in about 1 month (around 02/02/2018).        [name], MD  [facility]

## 2018-01-02 NOTE — Patient Instructions (Addendum)
    Meds ordered this encounter  Medications  . dapagliflozin propanediol (FARXIGA) 5 MG TABS tablet  (Samples new medication)    Sig: Take 5 mg by mouth daily.  . metFORMIN (GLUCOPHAGE-XR) 500 MG 24 hr tablet New Directions    Sig: Take 1 tablet (500 mg total) by mouth 2 (two) times daily.      Health Maintenance Due  Topic Date Due  . PNEUMOCOCCAL POLYSACCHARIDE VACCINE (1) 06/04/1980  . FOOT EXAM  06/04/1988  . HIV Screening  06/04/1993  . OPHTHALMOLOGY EXAM  09/05/2016

## 2018-01-15 ENCOUNTER — Other Ambulatory Visit: Payer: Self-pay | Admitting: Family Medicine

## 2018-01-15 DIAGNOSIS — M545 Low back pain: Principal | ICD-10-CM

## 2018-01-15 DIAGNOSIS — G8929 Other chronic pain: Secondary | ICD-10-CM

## 2018-01-15 NOTE — Telephone Encounter (Signed)
Patient needs refill on Oxycodone-Acet.  Sent to [facility]

## 2018-01-16 MED ORDER — OXYCODONE-ACETAMINOPHEN 7.5-325 MG PO TABS: 1.0000 | ORAL_TABLET | ORAL | 0 refills | Status: DC | PRN

## 2018-01-28 ENCOUNTER — Other Ambulatory Visit: Payer: Self-pay | Admitting: Family Medicine

## 2018-01-28 DIAGNOSIS — G8929 Other chronic pain: Secondary | ICD-10-CM

## 2018-01-28 DIAGNOSIS — M545 Low back pain: Principal | ICD-10-CM

## 2018-01-28 MED ORDER — FLUCONAZOLE 150 MG PO TABS: ORAL_TABLET | ORAL | 0 refills | Status: DC

## 2018-01-28 MED ORDER — OXYCODONE-ACETAMINOPHEN 7.5-325 MG PO TABS: 1.0000 | ORAL_TABLET | ORAL | 0 refills | Status: DC | PRN

## 2018-01-28 NOTE — Telephone Encounter (Signed)
Patient wants a refill on the Diflucan and [facility]codone [facility] [place]

## 2018-01-30 ENCOUNTER — Other Ambulatory Visit: Payer: Self-pay | Admitting: Family Medicine

## 2018-01-30 DIAGNOSIS — E119 Type 2 diabetes mellitus without complications: Secondary | ICD-10-CM

## 2018-02-02 NOTE — Progress Notes (Deleted)
Patient: Raymond Rose Male    DOB: August 27, 1975   42 y.o.   MRN: [id number] Visit Date: 02/02/2018  Today's Provider: [name], MD   No chief complaint on file.  Subjective:    HPI   Diabetes Mellitus Type II, Follow-up:   Lab Results  Component Value Date   HGBA1C 9.5 (A) 01/02/2018   HGBA1C 9.0 08/01/2017   HGBA1C 5.3 03/31/2017   Last seen for diabetes 1 months ago.  Management since then includes; reduced metformin due to nausea. Added [place]. Advised to follow up in 1 month. He reports {excellent/good/fair/poor:19665} compliance with treatment. He {ACTION; IS/IS UUV:25366440} having side effects. *** Current symptoms include {Symptoms; diabetes:14075} and have been {Desc; course:15616}. Home blood sugar records: {diabetes glucometry results:16657}  Episodes of hypoglycemia? {yes***/no:17258}   Current Insulin Regimen: *** Most Recent Eye Exam: *** Weight trend: {trend:16658} Prior visit with dietician: {yes/no:17258} Current diet: {diet habits:16563} Current exercise: {exercise types:16438}  ------------------------------------------------------------------------   Allergies  Allergen Reactions  . Bupropion     Extreme anxiety  . Morphine      Current Outpatient Medications:  .  dapagliflozin propanediol (FARXIGA) 5 MG TABS tablet, Take 5 mg by mouth daily., Disp: 28 tablet, Rfl: 0 .  escitalopram (LEXAPRO) 20 MG tablet, Take 1 tablet (20 mg total) by mouth daily., Disp: 30 tablet, Rfl: 5 .  glipiZIDE (GLUCOTROL XL) 5 MG 24 hr tablet, TAKE 1 TABLET (5 MG TOTAL) BY MOUTH DAILY WITH BREAKFAST., Disp: 30 tablet, Rfl: 11 .  glucose blood (ONETOUCH VERIO) test strip, 1 strip daily. Reported on 10/20/2015, Disp: , Rfl:  .  ketoconazole (NIZORAL) 2 % cream, APPLY TO AFFECTED AREA ONCE DAILY FOR 7 DAYS AS NEEDED, Disp: 15 g, Rfl: 3 .  metFORMIN (GLUCOPHAGE-XR) 500 MG 24 hr tablet, TAKE 2 TABLETS BY MOUTH TWICE A DAY, Disp: 120 tablet, Rfl: 3 .   omeprazole (PRILOSEC) 40 MG capsule, TAKE 1 CAPSULE (40 MG TOTAL) BY MOUTH DAILY., Disp: 30 capsule, Rfl: 11 .  ondansetron (ZOFRAN) 4 MG tablet, Take 1 tablet (4 mg total) by mouth every 8 (eight) hours as needed for nausea or vomiting., Disp: 20 tablet, Rfl: 0 .  oxyCODONE-acetaminophen (PERCOCET) 7.5-325 MG tablet, Take 1 tablet by mouth every 4 (four) hours as needed for severe pain., Disp: 60 tablet, Rfl: 0 .  sildenafil (VIAGRA) 50 MG tablet, Take one or two as needed, no more than 2 in a day, Disp: 10 tablet, Rfl: 0  Review of Systems  Constitutional: Negative for appetite change, chills and fever.  Respiratory: Negative for chest tightness, shortness of breath and wheezing.   Cardiovascular: Negative for chest pain and palpitations.  Gastrointestinal: Negative for abdominal pain, nausea and vomiting.    Social History   Tobacco Use  . Smoking status: Former Smoker    Years: 5.00    Last attempt to quit: 06/17/1997    Years since quitting: 20.6  . Smokeless tobacco: Never Used  . Tobacco comment: started smoking at [age]he 16, and quit at age 77  Substance Use Topics  . Alcohol use: Yes    Alcohol/week: 0.0 standard drinks    Comment: occasional alcohol use   Objective:   There were no vitals taken for this visit. There were no vitals filed for this visit.   Physical Exam      Assessment & Plan:           [name], MD  [facility]

## 2018-02-03 ENCOUNTER — Ambulatory Visit: Payer: Self-pay | Admitting: Family Medicine

## 2018-02-10 ENCOUNTER — Encounter: Payer: Self-pay | Admitting: Family Medicine

## 2018-02-10 ENCOUNTER — Ambulatory Visit (INDEPENDENT_AMBULATORY_CARE_PROVIDER_SITE_OTHER): Payer: BLUE CROSS/BLUE SHIELD | Admitting: Family Medicine

## 2018-02-10 VITALS — BP 128/84 | HR 87 | Temp 98.6°F | Resp 16 | Wt 375.0 lb

## 2018-02-10 DIAGNOSIS — E119 Type 2 diabetes mellitus without complications: Secondary | ICD-10-CM

## 2018-02-10 LAB — POCT GLYCOSYLATED HEMOGLOBIN (HGB A1C)
Est. average glucose Bld gHb Est-mCnc: 220
Hemoglobin A1C: 9.3 % — AB (ref 4.0–5.6)

## 2018-02-10 MED ORDER — METFORMIN HCL ER 500 MG PO TB24: 500.0000 mg | ORAL_TABLET | Freq: Two times a day (BID) | ORAL | 0 refills | Status: DC

## 2018-02-10 MED ORDER — DAPAGLIFLOZIN PROPANEDIOL 10 MG PO TABS: 10.0000 mg | ORAL_TABLET | Freq: Every day | ORAL | 1 refills | Status: DC

## 2018-02-10 NOTE — Progress Notes (Signed)
Patient: Raymond Rose Male    DOB: June 13, 1976   42 y.o.   MRN: [id number] Visit Date: 02/10/2018  Today's Provider: [name], MD   Chief Complaint  Patient presents with  . Diabetes   Subjective:    HPI   Diabetes Mellitus Type II, Follow-up:   Lab Results  Component Value Date   HGBA1C 9.5 (A) 01/02/2018   HGBA1C 9.0 08/01/2017   HGBA1C 5.3 03/31/2017   Last seen for diabetes 4 weeks ago.  Management since then includes; reduced metformin due to nausea. Added [place] Gl[place] and advised to follow up in 1 month. He reports good compliance with treatment. He is not having side effects.  Current symptoms include none and have been stable. Home blood sugar records: fasting range: 220-250  Episodes of hypoglycemia? no   Current Insulin Regimen: none Most Recent Eye Exam: 1 year ago Weight trend: decreasing steadily Prior visit with dietician: no Current diet: well balanced Current exercise: golfing  ------------------------------------------------------------------------   Allergies  Allergen Reactions  . Bupropion     Extreme anxiety  . Morphine      Current Outpatient Medications:  .  dapagliflozin propanediol (FARXIGA) 5 MG TABS tablet, Take 5 mg by mouth daily., Disp: 28 tablet, Rfl: 0 .  escitalopram (LEXAPRO) 20 MG tablet, Take 1 tablet (20 mg total) by mouth daily., Disp: 30 tablet, Rfl: 5 .  glipiZIDE (GLUCOTROL XL) 5 MG 24 hr tablet, TAKE 1 TABLET (5 MG TOTAL) BY MOUTH DAILY WITH BREAKFAST., Disp: 30 tablet, Rfl: 11 .  glucose blood (ONETOUCH VERIO) test strip, 1 strip daily. Reported on 10/20/2015, Disp: , Rfl:  .  ketoconazole (NIZORAL) 2 % cream, APPLY TO AFFECTED AREA ONCE DAILY FOR 7 DAYS AS NEEDED, Disp: 15 g, Rfl: 3 .  metFORMIN (GLUCOPHAGE-XR) 500 MG 24 hr tablet, TAKE 2 TABLETS BY MOUTH TWICE A DAY (Patient taking differently: Take 500 mg by mouth 2 (two) times daily. ), Disp: 120 tablet, Rfl: 3 .  omeprazole (PRILOSEC) 40 MG capsule, TAKE  1 CAPSULE (40 MG TOTAL) BY MOUTH DAILY., Disp: 30 capsule, Rfl: 11 .  ondansetron (ZOFRAN) 4 MG tablet, Take 1 tablet (4 mg total) by mouth every 8 (eight) hours as needed for nausea or vomiting., Disp: 20 tablet, Rfl: 0 .  oxyCODONE-acetaminophen (PERCOCET) 7.5-325 MG tablet, Take 1 tablet by mouth every 4 (four) hours as needed for severe pain., Disp: 60 tablet, Rfl: 0 .  sildenafil (VIAGRA) 50 MG tablet, Take one or two as needed, no more than 2 in a day, Disp: 10 tablet, Rfl: 0  Review of Systems  Constitutional: Negative for appetite change, chills and fever.  Respiratory: Negative for chest tightness, shortness of breath and wheezing.   Cardiovascular: Negative for chest pain and palpitations.  Gastrointestinal: Positive for nausea and vomiting. Negative for abdominal pain.    Social History   Tobacco Use  . Smoking status: Former Smoker    Years: 5.00    Last attempt to quit: 06/17/1997    Years since quitting: 20.6  . Smokeless tobacco: Never Used  . Tobacco comment: started smoking at ahe 16, and quit at age 69  Substance Use Topics  . Alcohol use: Yes    Alcohol/week: 0.0 standard drinks    Comment: occasional alcohol use   Objective:   BP 128/84 (BP Location: Left Arm, Patient Position: Sitting, Cuff Size: Large)   Pulse 87   Temp 98.6 F (37 C) (Oral)  Resp 16   Wt (!) 375 lb (170.1 kg)   SpO2 97% Comment: room air  BMI 50.86 kg/m     Physical Exam   General Appearance:    Alert, cooperative, no distress  Eyes:    PERRL, conjunctiva/corneas clear, EOM's intact       Lungs:     Clear to auscultation bilaterally, respirations unlabored  Heart:    Regular rate and rhythm  Neurologic:   Awake, alert, oriented x 3. No apparent focal neurological           defect.       Results for orders placed or performed in visit on 02/10/18  POCT HgB A1C  Result Value Ref Range   Hemoglobin A1C 9.3 (A) 4.0 - 5.6 %   Est. average glucose Bld gHb Est-mCnc 220          Assessment & Plan:     1. Type 2 diabetes mellitus without complication, without long-term current use of insulin (HCC) Nausea improved with reduction in dose of metformin. Will continue same dose of metformin and increase Farxiga to 10mg  a day.  - metFORMIN (GLUCOPHAGE-XR) 500 MG 24 hr tablet; Take 1 tablet (500 mg total) by mouth 2 (two) times daily.  Dispense: 3 tablet; Refill: 0 - dapagliflozin propanediol (FARXIGA) 10 MG TABS tablet; Take 10 mg by mouth daily.  Dispense: 30 tablet; Refill: 1  Return in about 6 weeks (around 03/24/2018).       [name], MD  [facility]

## 2018-02-10 NOTE — Patient Instructions (Addendum)
Restart Farxiga 5mg  tablet once a day for one week, then Increase to 2 x 5mg  tablets (10mg  total) once a day until samples are out.  Then fill prescription to take 1 x 10mg  tablet once a day

## 2018-02-12 ENCOUNTER — Other Ambulatory Visit: Payer: Self-pay | Admitting: Family Medicine

## 2018-02-12 MED ORDER — FLUCONAZOLE 150 MG PO TABS: ORAL_TABLET | ORAL | 2 refills | Status: AC

## 2018-02-12 NOTE — Telephone Encounter (Signed)
Pt request I send a message for a refill of [facility]lucan [facility]

## 2018-02-18 ENCOUNTER — Other Ambulatory Visit: Payer: Self-pay | Admitting: Family Medicine

## 2018-02-18 DIAGNOSIS — M545 Low back pain: Principal | ICD-10-CM

## 2018-02-18 DIAGNOSIS — G8929 Other chronic pain: Secondary | ICD-10-CM

## 2018-02-18 MED ORDER — OXYCODONE-ACETAMINOPHEN 7.5-325 MG PO TABS: 1.0000 | ORAL_TABLET | ORAL | 0 refills | Status: DC | PRN

## 2018-02-18 NOTE — Telephone Encounter (Signed)
Pt needs refill on    Oxycodone 7.5-325  [facility]  CB#  [phone]  Thanks Con Mem[employer]

## 2018-03-04 ENCOUNTER — Other Ambulatory Visit: Payer: Self-pay | Admitting: Family Medicine

## 2018-03-04 DIAGNOSIS — M545 Low back pain: Principal | ICD-10-CM

## 2018-03-04 DIAGNOSIS — G8929 Other chronic pain: Secondary | ICD-10-CM

## 2018-03-04 NOTE — Telephone Encounter (Signed)
Pt requesting refill of Oxycodone 7.5-325 [facility] [place].

## 2018-03-05 MED ORDER — OXYCODONE-ACETAMINOPHEN 7.5-325 MG PO TABS: 1.0000 | ORAL_TABLET | ORAL | 0 refills | Status: DC | PRN

## 2018-03-05 NOTE — Telephone Encounter (Signed)
Pt called back to check on refill status.  Pt is leaving for vacation tomorrow and needing the refill asap.  Thanks, [employer]

## 2018-03-05 NOTE — Addendum Note (Signed)
Addended by: Julieta Bellini on: 03/05/2018 11:58 AM   Modules accepted: Orders

## 2018-03-23 ENCOUNTER — Other Ambulatory Visit: Payer: Self-pay | Admitting: Family Medicine

## 2018-03-23 DIAGNOSIS — F329 Major depressive disorder, single episode, unspecified: Secondary | ICD-10-CM

## 2018-03-23 DIAGNOSIS — F410 Panic disorder [episodic paroxysmal anxiety] without agoraphobia: Secondary | ICD-10-CM

## 2018-03-23 DIAGNOSIS — F32A Depression, unspecified: Secondary | ICD-10-CM

## 2018-03-25 ENCOUNTER — Ambulatory Visit: Payer: Self-pay | Admitting: Family Medicine

## 2018-03-27 ENCOUNTER — Other Ambulatory Visit: Payer: Self-pay | Admitting: Family Medicine

## 2018-03-27 DIAGNOSIS — M545 Low back pain, unspecified: Secondary | ICD-10-CM

## 2018-03-27 DIAGNOSIS — G8929 Other chronic pain: Secondary | ICD-10-CM

## 2018-03-27 MED ORDER — OXYCODONE-ACETAMINOPHEN 7.5-325 MG PO TABS: 1.0000 | ORAL_TABLET | ORAL | 0 refills | Status: DC | PRN

## 2018-03-27 NOTE — Telephone Encounter (Signed)
Pt contacted office for refill request on the following medications:  1. fluconazole (DIFLUCAN) 150 MG tablet  2. oxyCODONE-acetaminophen (PERCOCET) 7.5-325 MG tablet  [id number], [place] [phone] (Phone) [phone] (Fax)  Please advise. Thanks TNP

## 2018-04-22 ENCOUNTER — Ambulatory Visit: Payer: Self-pay | Admitting: Family Medicine

## 2018-04-24 ENCOUNTER — Other Ambulatory Visit: Payer: Self-pay | Admitting: Family Medicine

## 2018-04-24 DIAGNOSIS — M545 Low back pain, unspecified: Secondary | ICD-10-CM

## 2018-04-24 DIAGNOSIS — G8929 Other chronic pain: Secondary | ICD-10-CM

## 2018-04-24 DIAGNOSIS — E119 Type 2 diabetes mellitus without complications: Secondary | ICD-10-CM

## 2018-04-24 NOTE — Telephone Encounter (Signed)
Patient needs refill on Oxycodone 7.5 -325 mg. [facility] [place]

## 2018-04-25 MED ORDER — OXYCODONE-ACETAMINOPHEN 7.5-325 MG PO TABS: 1.0000 | ORAL_TABLET | ORAL | 0 refills | Status: DC | PRN

## 2018-05-11 ENCOUNTER — Other Ambulatory Visit: Payer: Self-pay

## 2018-05-11 DIAGNOSIS — N5089 Other specified disorders of the male genital organs: Secondary | ICD-10-CM

## 2018-05-11 DIAGNOSIS — M545 Low back pain, unspecified: Secondary | ICD-10-CM

## 2018-05-11 DIAGNOSIS — G8929 Other chronic pain: Secondary | ICD-10-CM

## 2018-05-11 MED ORDER — KETOCONAZOLE 2 % EX CREA: TOPICAL_CREAM | CUTANEOUS | 3 refills | Status: DC

## 2018-05-11 MED ORDER — OXYCODONE-ACETAMINOPHEN 7.5-325 MG PO TABS: 1.0000 | ORAL_TABLET | ORAL | 0 refills | Status: DC | PRN

## 2018-05-11 MED ORDER — FLUCONAZOLE 150 MG PO TABS: 150.0000 mg | ORAL_TABLET | Freq: Once | ORAL | 5 refills | Status: DC

## 2018-05-11 NOTE — Telephone Encounter (Signed)
Patient is also requesting a refill on Diflucan. This medication is not listed in patient chart.

## 2018-05-15 ENCOUNTER — Telehealth: Payer: Self-pay

## 2018-05-15 NOTE — Telephone Encounter (Signed)
Insurance requires documentation from recent office visit documenting necessity for and compliance with use of CPAP. He needs to make an appointment. Is also overdue for diabetes follow up.

## 2018-05-15 NOTE — Telephone Encounter (Signed)
Patient reports that he need prescription for a new cpap machine.  Patient's call back number is [phone]

## 2018-05-15 NOTE — Telephone Encounter (Signed)
Patient currently uses a CPAP machine, but he states his is 40 years old and is not working properly. Patient states the [facility] in [place] told him that his insurance would over a new machine every 5 years. Patient request a prescription for a new CPAP machine. He states the current setting on his CPAP is 11. Patient states he will pick the prescription up when it has been written.

## 2018-05-18 NOTE — Telephone Encounter (Signed)
Patient advised and appointment scheduled for 05/25/2018 at 3:20pm.

## 2018-05-25 ENCOUNTER — Ambulatory Visit: Payer: Self-pay | Admitting: Family Medicine

## 2018-06-02 ENCOUNTER — Other Ambulatory Visit: Payer: Self-pay | Admitting: Family Medicine

## 2018-06-02 DIAGNOSIS — M545 Low back pain, unspecified: Secondary | ICD-10-CM

## 2018-06-02 DIAGNOSIS — G8929 Other chronic pain: Secondary | ICD-10-CM

## 2018-06-02 DIAGNOSIS — E119 Type 2 diabetes mellitus without complications: Secondary | ICD-10-CM

## 2018-06-02 MED ORDER — DAPAGLIFLOZIN PROPANEDIOL 10 MG PO TABS: 10.0000 mg | ORAL_TABLET | Freq: Every day | ORAL | 0 refills | Status: DC

## 2018-06-02 MED ORDER — OXYCODONE-ACETAMINOPHEN 7.5-325 MG PO TABS: 1.0000 | ORAL_TABLET | ORAL | 0 refills | Status: DC | PRN

## 2018-06-02 NOTE — Telephone Encounter (Signed)
Patient is overdue for diabetes follow up, needs to schedule before prescriptions can be refilled.

## 2018-06-02 NOTE — Telephone Encounter (Signed)
Apt made for 06/08/2018 at 8am.   Thanks,   -Mickel Baas

## 2018-06-02 NOTE — Telephone Encounter (Signed)
Pt needing  refills on: oxyCODONE-acetaminophen (PERCOCET) 7.5-325 MG tablet dapagliflozin propanediol (FARXIGA) 10 MG TABS tablet  Please fill at: [id number], [place] (Phone) [phone] (Fax)    Thanks, [employer]

## 2018-06-08 ENCOUNTER — Encounter: Payer: Self-pay | Admitting: Family Medicine

## 2018-06-08 ENCOUNTER — Ambulatory Visit (INDEPENDENT_AMBULATORY_CARE_PROVIDER_SITE_OTHER): Payer: 59 | Admitting: Family Medicine

## 2018-06-08 VITALS — BP 126/74 | HR 76 | Temp 98.0°F | Resp 16 | Ht 72.0 in | Wt 357.0 lb

## 2018-06-08 DIAGNOSIS — E7800 Pure hypercholesterolemia, unspecified: Secondary | ICD-10-CM | POA: Diagnosis not present

## 2018-06-08 DIAGNOSIS — R1115 Cyclical vomiting syndrome unrelated to migraine: Secondary | ICD-10-CM | POA: Diagnosis not present

## 2018-06-08 DIAGNOSIS — E119 Type 2 diabetes mellitus without complications: Secondary | ICD-10-CM

## 2018-06-08 DIAGNOSIS — J3489 Other specified disorders of nose and nasal sinuses: Secondary | ICD-10-CM

## 2018-06-08 DIAGNOSIS — G4733 Obstructive sleep apnea (adult) (pediatric): Secondary | ICD-10-CM

## 2018-06-08 LAB — POCT GLYCOSYLATED HEMOGLOBIN (HGB A1C): Hemoglobin A1C: 9.3 % — AB (ref 4.0–5.6)

## 2018-06-08 LAB — POCT UA - MICROALBUMIN: Microalbumin Ur, POC: 50 mg/L

## 2018-06-08 MED ORDER — AMITRIPTYLINE HCL 25 MG PO TABS: 25.0000 mg | ORAL_TABLET | Freq: Every day | ORAL | 1 refills | Status: DC

## 2018-06-08 MED ORDER — GLIPIZIDE ER 10 MG PO TB24: 10.0000 mg | ORAL_TABLET | Freq: Every day | ORAL | 1 refills | Status: DC

## 2018-06-08 NOTE — Progress Notes (Signed)
Patient: Raymond Rose Male    DOB: 07-18-1975   42 y.o.   MRN: [id number] Visit Date: 06/08/2018  Today's Provider: [name], MD   Chief Complaint  Patient presents with  . Nausea    On going issue; pt reports he has nausea and vomiting almost everyday.  . Diabetes  . Sleep Apnea   Subjective:      Diabetes Mellitus Type II, Follow-up:   Lab Results  Component Value Date   HGBA1C 9.3 (A) 02/10/2018   HGBA1C 9.5 (A) 01/02/2018   HGBA1C 9.0 08/01/2017   Last seen for diabetes 4 months ago.  Management since then includes Farxiga to 10mg  daily. He reports excellent compliance with treatment. He is not having side effects.  Current symptoms include nausea, polydipsia, polyuria and vomitting and have been unchanged. Home blood sugar records: Pt reports he has not checked his blood sugar in the last 2-3 months, but before that his blood sugars where in the low 200's.   Episodes of hypoglycemia? no   Current Insulin Regimen: None Most Recent Eye Exam: UTD.  Pt due again at the beginning of next year. Weight trend: decreasing steadily  Current diet: in general, an "unhealthy" diet Current exercise: walking and Golf  ------------------------------------------------------------------------     Lipid/Cholesterol, Follow-up:   Last seen for this 2 years ago.  Management since that visit includes None.  Last Lipid Panel:    Component Value Date/Time   CHOL 222 (H) 05/23/2016 0950   TRIG 370 (H) 05/23/2016 0950   HDL 30 (L) 05/23/2016 0950   CHOLHDL 7.4 (H) 05/23/2016 0950   LDLCALC 118 (H) 05/23/2016 0950    He reports good compliance with treatment. He is not having side effects.   Wt Readings from Last 3 Encounters:  06/08/18 (!) 357 lb (161.9 kg)  02/10/18 (!) 375 lb (170.1 kg)  01/02/18 (!) 381 lb (172.8 kg)    ------------------------------------------------------------------------  Nausea and vomiting.  Has been experiencing  persistent daily nausea and having vomiting 2-3 days a week since March. Prior to onset of sx he had been changed from lexapro and to venlafaxine which was initially thought to be cause of nausea. He had slight improvement when changed but nor resolution of sx when changed back to lexapro. Was referred to Dr. Benson Norway and had abdominal CT UGI and NM gastric emptying study and no cause of sx were discovered. Zofran helps but has to take every day. He states he does have excessive amount of nasal drainage when he has vomiting episodes and asks for ENT referral to evaluate.  He does have history of cluster headaches, but not migraine headaches.states he rarely has headaches now.   Follow up [facility] He reports that he has had current CPAP machine over 10 yeas and was using every night until it malfunction a few weeks ago and is no unusable. He has been in contact with [facility] and advised he can get a new CPAP machine if he has a prescription for it.   Allergies  Allergen Reactions  . Bupropion     Extreme anxiety  . Morphine      Current Outpatient Medications:  .  dapagliflozin propanediol (FARXIGA) 10 MG TABS tablet, Take 10 mg by mouth daily., Disp: 30 tablet, Rfl: 0 .  escitalopram (LEXAPRO) 20 MG tablet, TAKE 1 TABLET BY MOUTH EVERY DAY, Disp: 30 tablet, Rfl: 5 .  glipiZIDE (GLUCOTROL XL) 5 MG 24 hr tablet, TAKE 1  TABLET (5 MG TOTAL) BY MOUTH DAILY WITH BREAKFAST., Disp: 30 tablet, Rfl: 11 .  glucose blood (ONETOUCH VERIO) test strip, 1 strip daily. Reported on 10/20/2015, Disp: , Rfl:  .  ketoconazole (NIZORAL) 2 % cream, APPLY TO AFFECTED AREA ONCE DAILY FOR 7 DAYS AS NEEDED, Disp: 15 g, Rfl: 3 .  metFORMIN (GLUCOPHAGE-XR) 500 MG 24 hr tablet, Take 1 tablet (500 mg total) by mouth 2 (two) times daily., Disp: 3 tablet, Rfl: 0 .  omeprazole (PRILOSEC) 40 MG capsule, TAKE 1 CAPSULE (40 MG TOTAL) BY MOUTH DAILY., Disp: 30 capsule, Rfl: 11 .  ondansetron (ZOFRAN) 4 MG tablet, Take 1 tablet (4 mg  total) by mouth every 8 (eight) hours as needed for nausea or vomiting., Disp: 20 tablet, Rfl: 0 .  oxyCODONE-acetaminophen (PERCOCET) 7.5-325 MG tablet, Take 1 tablet by mouth every 4 (four) hours as needed for severe pain., Disp: 60 tablet, Rfl: 0 .  sildenafil (VIAGRA) 50 MG tablet, Take one or two as needed, no more than 2 in a day, Disp: 10 tablet, Rfl: 0  Review of Systems  Constitutional: Positive for weight loss. Negative for chills and fever.  Respiratory: Negative.   Cardiovascular: Negative.  Negative for chest pain.  Gastrointestinal: Positive for abdominal distention, nausea and vomiting. Negative for abdominal pain, anal bleeding, blood in stool, constipation, diarrhea and rectal pain.  Endocrine: Positive for polydipsia and polyuria. Negative for cold intolerance, heat intolerance and polyphagia.  Musculoskeletal: Negative.   Neurological: Negative for dizziness, light-headedness and headaches.    Social History   Tobacco Use  . Smoking status: Former Smoker    Years: 5.00    Last attempt to quit: 06/17/1997    Years since quitting: 20.9  . Smokeless tobacco: Never Used  . Tobacco comment: started smoking at ahe 16, and quit at age 7  Substance Use Topics  . Alcohol use: Yes    Alcohol/week: 0.0 standard drinks    Comment: occasional alcohol use      Objective:   BP 126/74 (BP Location: Right Arm, Patient Position: Sitting, Cuff Size: Large)   Pulse 76   Temp 98 F (36.7 C) (Oral)   Resp 16   Ht 6' (1.829 m)   Wt (!) 357 lb (161.9 kg)   BMI 48.42 kg/m  Vitals:   06/08/18 0832  BP: 126/74  Pulse: 76  Resp: 16  Temp: 98 F (36.7 C)  TempSrc: Oral  Weight: (!) 357 lb (161.9 kg)  Height: 6' (1.829 m)     Physical Exam  General Appearance:    Alert, cooperative, no distress, morbidly obese  Eyes:    PERRL, conjunctiva/corneas clear, EOM's intact       Lungs:     Clear to auscultation bilaterally, respirations unlabored  Heart:    Regular rate and  rhythm  Neurologic:   Awake, alert, oriented x 3. No apparent focal neurological           defect.       Results for orders placed or performed in visit on 06/08/18  POCT glycosylated hemoglobin (Hb A1C)  Result Value Ref Range   Hemoglobin A1C 9.3 (A) 4.0 - 5.6 %  POCT UA - Microalbumin  Result Value Ref Range   Microalbumin Ur, POC 50 mg/L      Assessment & Plan    1. Type 2 diabetes mellitus without complication, without long-term current use of insulin (HCC) Uncontrolled. Will increase glipizide to 10mg  daily  - POCT glycosylated hemoglobin (  Hb A1C) - POCT UA - Microalbumin - CBC - Comprehensive metabolic panel - Lipid panel - glipiZIDE (GLUCOTROL XL) 10 MG 24 hr tablet; Take 1 tablet (10 mg total) by mouth daily with breakfast.  Dispense: 90 tablet; Refill: 1  2. Hypercholesteremia Consider adding statin after reviewing labs.  - CBC - Comprehensive metabolic panel - Lipid panel  3. Cyclic vomiting syndrome (suspected) Unremarkable GI work up. He would like ENT evaluation as below due to heavy nasal drainage occurring around times of vomiting. Try- amitriptyline (ELAVIL) 25 MG tablet; Take 1 tablet (25 mg total) by mouth at bedtime.  Dispense: 30 tablet; Refill: 1  4. Nasal drainage  - Ambulatory referral to ENT  5. Obstructive sleep apnea Compliant and medically benefiting from CPAP which is no longer functions. Prescription written for new CPAP that he is going to take to [facility].      [name], MD  [facility]

## 2018-06-08 NOTE — Patient Instructions (Signed)
.   PLEASE BRING ALL OF YOUR MEDICATIONS TO EVERY APPOINTMENT TO MAKE SURE OUR MEDICATION LIST IS THE SAME AS YOURS   

## 2018-06-09 ENCOUNTER — Telehealth: Payer: Self-pay

## 2018-06-09 ENCOUNTER — Encounter: Payer: Self-pay | Admitting: Family Medicine

## 2018-06-09 LAB — COMPREHENSIVE METABOLIC PANEL
ALT: 23 IU/L (ref 0–44)
AST: 32 IU/L (ref 0–40)
Albumin/Globulin Ratio: 1.7 (ref 1.2–2.2)
Albumin: 4.5 g/dL (ref 3.5–5.5)
Alkaline Phosphatase: 100 IU/L (ref 39–117)
BUN/Creatinine Ratio: 17 (ref 9–20)
BUN: 11 mg/dL (ref 6–24)
Bilirubin Total: 0.3 mg/dL (ref 0.0–1.2)
CO2: 19 mmol/L — ABNORMAL LOW (ref 20–29)
Calcium: 9.7 mg/dL (ref 8.7–10.2)
Chloride: 102 mmol/L (ref 96–106)
Creatinine, Ser: 0.66 mg/dL — ABNORMAL LOW (ref 0.76–1.27)
GFR calc Af Amer: 140 mL/min/{1.73_m2} (ref >59)
GFR calc non Af Amer: 121 mL/min/{1.73_m2} (ref >59)
Globulin, Total: 2.7 g/dL (ref 1.5–4.5)
Glucose: 251 mg/dL — ABNORMAL HIGH (ref 65–99)
Potassium: 4.4 mmol/L (ref 3.5–5.2)
Sodium: 142 mmol/L (ref 134–144)
Total Protein: 7.2 g/dL (ref 6.0–8.5)

## 2018-06-09 LAB — CBC
Hematocrit: 44.5 % (ref 37.5–51.0)
Hemoglobin: 14.5 g/dL (ref 13.0–17.7)
MCH: 27.8 pg (ref 26.6–33.0)
MCHC: 32.6 g/dL (ref 31.5–35.7)
MCV: 85 fL (ref 79–97)
Platelets: 308 10*3/uL (ref 150–450)
RBC: 5.21 x10E6/uL (ref 4.14–5.80)
RDW: 14.2 % (ref 12.3–15.4)
WBC: 6.2 10*3/uL (ref 3.4–10.8)

## 2018-06-09 LAB — LIPID PANEL
Chol/HDL Ratio: 8 ratio — ABNORMAL HIGH (ref 0.0–5.0)
Cholesterol, Total: 241 mg/dL — ABNORMAL HIGH (ref 100–199)
HDL: 30 mg/dL — ABNORMAL LOW (ref >39)
Triglycerides: 449 mg/dL — ABNORMAL HIGH (ref 0–149)

## 2018-06-09 MED ORDER — ROSUVASTATIN CALCIUM 10 MG PO TABS: 10.0000 mg | ORAL_TABLET | Freq: Every day | ORAL | 3 refills | Status: DC

## 2018-06-09 NOTE — Telephone Encounter (Signed)
Pt advised RX sent to [facility] [place].   Thanks,   -Taesha Goodell  

## 2018-06-09 NOTE — Telephone Encounter (Signed)
-----   Message from Birdie Sons, MD sent at 06/09/2018  8:08 AM EST ----- Cholesterol is very high at 242. Triglycerides are very high at 449. Need to start rosuvastatin 10mg  once a day, #30, rf x 3 and recheck lipids in 3 months at o.v in march. Rest of labs are normal.

## 2018-06-11 ENCOUNTER — Telehealth: Payer: Self-pay

## 2018-06-11 NOTE — Telephone Encounter (Signed)
Barbaraann Rondo from [facility] [phone]) needs new order for patient's CPAP replacement.  First one did not have any pressure setting on it.  He needs one to include pressure setting or one for CPAP Auto pressure with pressure reading to be 4-20.

## 2018-06-11 NOTE — Telephone Encounter (Signed)
Order printed, please fax.

## 2018-06-11 NOTE — Telephone Encounter (Signed)
Please review. Thanks!  

## 2018-06-11 NOTE — Telephone Encounter (Signed)
Was this done?

## 2018-06-15 ENCOUNTER — Other Ambulatory Visit: Payer: Self-pay | Admitting: Family Medicine

## 2018-06-15 DIAGNOSIS — M545 Low back pain, unspecified: Secondary | ICD-10-CM

## 2018-06-15 DIAGNOSIS — G8929 Other chronic pain: Secondary | ICD-10-CM

## 2018-06-15 MED ORDER — OXYCODONE-ACETAMINOPHEN 7.5-325 MG PO TABS: 1.0000 | ORAL_TABLET | ORAL | 0 refills | Status: DC | PRN

## 2018-06-15 NOTE — Telephone Encounter (Signed)
Pt needing a refill on:  oxyCODONE-acetaminophen (PERCOCET) 7.5-325 MG tablet  Please fill at:  [id number] [place] [place] (Phone) [phone] (Fax)    Thanks, [employer]

## 2018-07-02 ENCOUNTER — Other Ambulatory Visit: Payer: Self-pay

## 2018-07-02 DIAGNOSIS — G8929 Other chronic pain: Secondary | ICD-10-CM

## 2018-07-02 DIAGNOSIS — M545 Low back pain, unspecified: Secondary | ICD-10-CM

## 2018-07-02 MED ORDER — OXYCODONE-ACETAMINOPHEN 7.5-325 MG PO TABS: 1.0000 | ORAL_TABLET | ORAL | 0 refills | Status: DC | PRN

## 2018-07-02 NOTE — Telephone Encounter (Signed)
Patient calling requesting refill request on the following medications  oxyCODONE-acetaminophen (PERCOCET) 7.5-325 MG tablet   and is also requesting for Diflucan.  [facility]

## 2018-07-10 ENCOUNTER — Telehealth: Payer: Self-pay | Admitting: Family Medicine

## 2018-07-10 DIAGNOSIS — F32A Depression, unspecified: Secondary | ICD-10-CM

## 2018-07-10 DIAGNOSIS — F329 Major depressive disorder, single episode, unspecified: Secondary | ICD-10-CM

## 2018-07-10 DIAGNOSIS — F410 Panic disorder [episodic paroxysmal anxiety] without agoraphobia: Secondary | ICD-10-CM

## 2018-07-10 NOTE — Telephone Encounter (Signed)
I spoke with Mr. Sontag.  He has a long history of depression, he states he has tried several treatments in the past with no relief in symptoms.  He says he has "Overwelming spells of sadness that are becoming more frequent."  He admits to having suicidal thoughts but knows he will not act on them.   He is hoping psychiatry will help him with different treatment options.   Please advise.   Thanks,   -Mickel Baas

## 2018-07-10 NOTE — Telephone Encounter (Signed)
Have entered order. He should hear from [facility] next week.

## 2018-07-10 NOTE — Telephone Encounter (Signed)
Need more information. Cannot make referral without knowing reason for referral.

## 2018-07-10 NOTE — Telephone Encounter (Signed)
Patient needs referral to [facility].    Patient tried to make own appt and they would not take him without referral.

## 2018-07-14 ENCOUNTER — Telehealth: Payer: Self-pay | Admitting: Family Medicine

## 2018-07-14 NOTE — Telephone Encounter (Signed)
°  [facility] called regarding pt's CPap Replacement. Current one is broken. Needing: Tubing / head gear / facemask  Fax replacement request to: [phone]  Thanks, [facility]

## 2018-07-15 NOTE — Telephone Encounter (Signed)
Please verify with patient that he has requested supplies from this company. If so can fax printed order.

## 2018-07-16 NOTE — Telephone Encounter (Signed)
Order was printed and signed. Please fax.

## 2018-07-16 NOTE — Telephone Encounter (Signed)
Order has been faxed.   Thanks,   -Mickel Baas

## 2018-07-16 NOTE — Telephone Encounter (Signed)
Tried calling patient. Left message to call back. 

## 2018-07-16 NOTE — Telephone Encounter (Signed)
Pt returned call. Pt verified that he did request an order be sent to [facility] for his replacement CPAP supplies. Please advise. Thanks TNP

## 2018-07-24 ENCOUNTER — Other Ambulatory Visit: Payer: Self-pay | Admitting: Family Medicine

## 2018-07-24 DIAGNOSIS — M545 Low back pain, unspecified: Secondary | ICD-10-CM

## 2018-07-24 DIAGNOSIS — G8929 Other chronic pain: Secondary | ICD-10-CM

## 2018-07-24 MED ORDER — OXYCODONE-ACETAMINOPHEN 7.5-325 MG PO TABS: 1.0000 | ORAL_TABLET | ORAL | 0 refills | Status: DC | PRN

## 2018-07-24 NOTE — Telephone Encounter (Signed)
Pt needs refill on   Oxycodone 7.5-325  Diflucan pill for yeast  [facility]  [place]  thanks teri

## 2018-07-28 ENCOUNTER — Telehealth: Payer: Self-pay | Admitting: Family Medicine

## 2018-07-28 NOTE — Telephone Encounter (Signed)
Pt is calling regarding the referral to [facility]  He has not heard anything yet and was told he would be referred three  Weeks ago.  CB#  [phone]  Con Memos

## 2018-07-29 NOTE — Telephone Encounter (Signed)
Looks like appt was made fo 11/04/2018. See referral tab in chart review

## 2018-07-30 ENCOUNTER — Other Ambulatory Visit: Payer: Self-pay | Admitting: Family Medicine

## 2018-07-30 DIAGNOSIS — R1115 Cyclical vomiting syndrome unrelated to migraine: Secondary | ICD-10-CM

## 2018-07-30 DIAGNOSIS — J3489 Other specified disorders of nose and nasal sinuses: Secondary | ICD-10-CM

## 2018-08-02 ENCOUNTER — Other Ambulatory Visit: Payer: Self-pay | Admitting: Family Medicine

## 2018-08-02 DIAGNOSIS — E119 Type 2 diabetes mellitus without complications: Secondary | ICD-10-CM

## 2018-08-03 ENCOUNTER — Telehealth: Payer: Self-pay | Admitting: Family Medicine

## 2018-08-03 NOTE — Telephone Encounter (Signed)
Raymond Rose with Raymond Rose is needing the initial baseline sleep study and most current office note.  She said she received something but it is not what she needed.  Fax number [phone]  CB#  [phone]  Thanks Con Memos

## 2018-08-04 NOTE — Telephone Encounter (Signed)
A request has been placed for his paper chart to get the baseline sleep study.   Thanks,   -Mickel Baas

## 2018-08-05 NOTE — Telephone Encounter (Signed)
Per Raymond Rose (medical records) chart has been requested and should be here on Monday 08/10/2018.

## 2018-08-10 ENCOUNTER — Encounter: Payer: Self-pay | Admitting: Family Medicine

## 2018-08-10 ENCOUNTER — Ambulatory Visit (INDEPENDENT_AMBULATORY_CARE_PROVIDER_SITE_OTHER): Payer: 59 | Admitting: Family Medicine

## 2018-08-10 VITALS — BP 140/76 | HR 96 | Temp 98.7°F | Resp 16 | Wt 353.0 lb

## 2018-08-10 DIAGNOSIS — F329 Major depressive disorder, single episode, unspecified: Secondary | ICD-10-CM

## 2018-08-10 DIAGNOSIS — G8929 Other chronic pain: Secondary | ICD-10-CM

## 2018-08-10 DIAGNOSIS — M545 Low back pain, unspecified: Secondary | ICD-10-CM

## 2018-08-10 DIAGNOSIS — F32A Depression, unspecified: Secondary | ICD-10-CM

## 2018-08-10 MED ORDER — OXYCODONE-ACETAMINOPHEN 7.5-325 MG PO TABS: 1.0000 | ORAL_TABLET | ORAL | 0 refills | Status: DC | PRN

## 2018-08-10 MED ORDER — VENLAFAXINE HCL ER 75 MG PO CP24: 75.0000 mg | ORAL_CAPSULE | Freq: Every day | ORAL | 0 refills | Status: DC

## 2018-08-10 NOTE — Progress Notes (Signed)
Patient: Raymond Rose Male    DOB: [birth year]   43 y.o.   MRN: [id number] Visit Date: 08/10/2018  Today's Provider: [name], MD   Chief Complaint  Patient presents with  . Depression    Worsening in the last few months.   Subjective:     Depression         This is a chronic problem.  The problem has been gradually worsening (Especially in the last few months.) since onset.  Associated symptoms include fatigue, helplessness, hopelessness, decreased interest, sad and suicidal ideas.  Associated symptoms include no decreased concentration, does not have insomnia (Pt reports he may be sleeping too much), no restlessness, no appetite change, no body aches, no myalgias and no headaches.  Past treatments include SSRIs - Selective serotonin reuptake inhibitors. Was referred to Dr. Nicolasa Ducking but states she can't get him in until May. States he has some good days like today, but other days gets extremely depressed, doesn't get out of bed, on those days he has recurring thoughts of suicide, but has not formulated a plan. Does not feel suicidal at all today.   Allergies  Allergen Reactions  . Bupropion     Extreme anxiety  . Morphine      Current Outpatient Medications:  .  amitriptyline (ELAVIL) 25 MG tablet, TAKE 1 TABLET BY MOUTH EVERYDAY AT BEDTIME, Disp: 30 tablet, Rfl: 3 .  escitalopram (LEXAPRO) 20 MG tablet, TAKE 1 TABLET BY MOUTH EVERY DAY, Disp: 30 tablet, Rfl: 5 .  FARXIGA 10 MG TABS tablet, TAKE 1 TABLET BY MOUTH DAILY. NEEDS FOLLOW UP VISIT, Disp: 30 tablet, Rfl: 1 .  glipiZIDE (GLUCOTROL XL) 10 MG 24 hr tablet, Take 1 tablet (10 mg total) by mouth daily with breakfast., Disp: 90 tablet, Rfl: 1 .  glucose blood (ONETOUCH VERIO) test strip, 1 strip daily. Reported on 10/20/2015, Disp: , Rfl:  .  ketoconazole (NIZORAL) 2 % cream, APPLY TO AFFECTED AREA ONCE DAILY FOR 7 DAYS AS NEEDED, Disp: 15 g, Rfl: 3 .  metFORMIN (GLUCOPHAGE-XR) 500 MG 24 hr tablet, Take 1 tablet (500  mg total) by mouth 2 (two) times daily., Disp: 3 tablet, Rfl: 0 .  omeprazole (PRILOSEC) 40 MG capsule, TAKE 1 CAPSULE (40 MG TOTAL) BY MOUTH DAILY., Disp: 30 capsule, Rfl: 11 .  ondansetron (ZOFRAN) 4 MG tablet, Take 1 tablet (4 mg total) by mouth every 8 (eight) hours as needed for nausea or vomiting., Disp: 20 tablet, Rfl: 0 .  oxyCODONE-acetaminophen (PERCOCET) 7.5-325 MG tablet, Take 1 tablet by mouth every 4 (four) hours as needed for severe pain., Disp: 60 tablet, Rfl: 0 .  rosuvastatin (CRESTOR) 10 MG tablet, Take 1 tablet (10 mg total) by mouth daily., Disp: 30 tablet, Rfl: 3 .  sildenafil (VIAGRA) 50 MG tablet, Take one or two as needed, no more than 2 in a day, Disp: 10 tablet, Rfl: 0  Review of Systems  Constitutional: Positive for fatigue. Negative for activity change, appetite change, chills, diaphoresis, fever and unexpected weight change.  Respiratory: Negative.   Cardiovascular: Negative.   Gastrointestinal: Negative.   Musculoskeletal: Negative.  Negative for myalgias.  Neurological: Negative for dizziness, light-headedness and headaches.  Psychiatric/Behavioral: Positive for depression, dysphoric mood and suicidal ideas. Negative for agitation, behavioral problems, confusion, decreased concentration, hallucinations, self-injury and sleep disturbance. The patient is not nervous/anxious, does not have insomnia (Pt reports he may be sleeping too much) and is not hyperactive.  Social History   Tobacco Use  . Smoking status: Former Smoker    Years: 5.00    Last attempt to quit: 06/17/1997    Years since quitting: 21.1  . Smokeless tobacco: Never Used  . Tobacco comment: started smoking at ahe 16, and quit at age 38  Substance Use Topics  . Alcohol use: Yes    Alcohol/week: 0.0 standard drinks    Comment: occasional alcohol use      Objective:   BP 140/76 (BP Location: Right Arm, Patient Position: Sitting, Cuff Size: Large)   Pulse 96   Temp 98.7 F (37.1 C) (Oral)    Resp 16   Wt (!) 353 lb (160.1 kg)   BMI 47.88 kg/m     Physical Exam  General appearance: alert, well developed, well nourished, cooperative and in no distress Head: Normocephalic, without obvious abnormality, atraumatic Respiratory: Respirations even and unlabored, normal respiratory rate Extremities: No gross deformities Skin: Skin color, texture, turgor normal. No rashes seen  Psych: Appropriate mood and affect. Neurologic: Mental status: Alert, oriented to person, place, and time, thought content appropriate.     Assessment & Plan    1. Depression, unspecified depression type Symptoms waxing and waning, but overall he feels that lexapro is no longer effective for him. He is not suicidal today, but has had occasionally had recurring thoughts of suicide over the last few weeks. He is here with his wife today and counseled regarding availability of crisis clinics such as [facility].   Will wean escitalopram and transition to - venlafaxine XR (EFFEXOR XR) 75 MG 24 hr capsule; Take 1 capsule (75 mg total) by mouth daily with breakfast. Take one capsule every morning for 7 days, then two capsules for 7 days, then 3 capsules  Dispense: 90 capsule; Refill: 0  Recheck in 2 weeks. Call if any problems with medication.   2. Chronic low back pain refill- oxyCODONE-acetaminophen (PERCOCET) 7.5-325 MG tablet; Take 1 tablet by mouth every 4 (four) hours as needed for severe pain.  Dispense: 60 tablet; Refill: 0     [name], MD  [facility]

## 2018-08-10 NOTE — Patient Instructions (Addendum)
.   Take 1/2 tablet escitalopram (Lexapro) along with 1 capsule of venlafaxine every morning for the next week, then  . Stop Lexapro and increase venlafaxine to 2 capsules every day, then  . Increase venlafaxine to 3 capsules every day

## 2018-08-11 ENCOUNTER — Telehealth: Payer: Self-pay | Admitting: Family Medicine

## 2018-08-11 DIAGNOSIS — F329 Major depressive disorder, single episode, unspecified: Secondary | ICD-10-CM

## 2018-08-11 DIAGNOSIS — F32A Depression, unspecified: Secondary | ICD-10-CM

## 2018-08-11 NOTE — Telephone Encounter (Signed)
Pt called stating his insurance will not cover the Rx due to the way the directions are written.  venlafaxine XR (EFFEXOR XR) 75 MG 24 hr capsule   Please advise.  Thanks, [employer]

## 2018-08-11 NOTE — Telephone Encounter (Signed)
[facility]  He states the insurance should cover if pt is prescribed 75mg  and 150mg  tablets.  Start with a 7 day supply of 75mg  and 7 day supply of 150mg  then 30 day supply of both.    [facility] [place].   Thanks,   -Mickel Baas

## 2018-08-11 NOTE — Telephone Encounter (Signed)
OK, can just call in 75mg  once a day for 7 days, then another prescription for 150mg  once a day for 30 days. Hold off on increasing above 150 until his follow up appointment.

## 2018-08-12 ENCOUNTER — Telehealth: Payer: Self-pay | Admitting: Family Medicine

## 2018-08-12 MED ORDER — VENLAFAXINE HCL ER 75 MG PO CP24: ORAL_CAPSULE | ORAL | 0 refills | Status: DC

## 2018-08-12 MED ORDER — VENLAFAXINE HCL ER 150 MG PO CP24: 150.0000 mg | ORAL_CAPSULE | Freq: Every day | ORAL | 0 refills | Status: DC

## 2018-08-12 NOTE — Telephone Encounter (Signed)
Whatever the insurance company wants. He just needs to take 37.5mg  a day for 7 days, then go up to 75mg  a day. I don't care if he takes one 75 or 2 of the 37.5mg  tablets

## 2018-08-12 NOTE — Telephone Encounter (Signed)
°  Pt's insurance will still not cover the latest venlafaxine XR (EFFEXOR XR) 75 MG 24 hr capsule   that was called in for pt.   Insurance told pt they will cover the 37.5 mg of the venlafaxine XR (EFFEXOR XR).     [facility] back [id number], [place] [phone] (Phone) [phone] (Fax)   Thanks, [employer]

## 2018-08-12 NOTE — Telephone Encounter (Signed)
Is it okay to change to 2 37.5mg  tablets for one week?  Thanks,   -Mickel Baas

## 2018-08-12 NOTE — Telephone Encounter (Signed)
[facility] sent to [facility] [place].  Pt advised on new instructions.    Thanks,   -Mickel Baas

## 2018-08-13 MED ORDER — VENLAFAXINE HCL ER 37.5 MG PO CP24: ORAL_CAPSULE | ORAL | 1 refills | Status: DC

## 2018-08-13 NOTE — Telephone Encounter (Signed)
[facility] stating pt's insurance will not cover venlafaxine XR (EFFEXOR XR) 75 MG 24 hr capsule but will cover 37.5 mg or 150 mg. [facility] with for either 37.5 mg or 150 mg. Please advise. Thanks TNP

## 2018-08-13 NOTE — Telephone Encounter (Signed)
Patient advised of the instructions as below and verbally voiced understanding. I spoke with the pharmacist, and they say that they need a new prescription for the 37.5mg  dosage. Please review prescription and send to pharmacy (I was told that the prescriber had to either co-sign behind me or send this medication in).

## 2018-08-21 ENCOUNTER — Telehealth: Payer: Self-pay

## 2018-08-21 NOTE — Telephone Encounter (Signed)
Patient was advised.  

## 2018-08-21 NOTE — Telephone Encounter (Signed)
According to Dr. Maralyn Sago note, he wanted patient to wean off Lexapro. Since he has been taking lexapro 20 mg daily, he can start taking 10 mg daily for two weeks and then 10 mg every other day for a week and stop. Check in with Dr. Caryn Section at his follow up on 08/28/2018.

## 2018-08-21 NOTE — Telephone Encounter (Signed)
Patient called office stating that he was put on 75mg  of Effexor by Dr. Caryn Section. He states that  because of insurance purpose he started medication at 37.5mg  patient states that issue is straight now with insurance and will be starting at 75mg  next week but wanted to know if he was suppose to continue Lexapro while on Effexor? Patient was advised that dr. Caryn Section is out of office and message will be forwarded to PA. KW

## 2018-08-28 ENCOUNTER — Ambulatory Visit: Payer: 59 | Admitting: Family Medicine

## 2018-09-01 ENCOUNTER — Other Ambulatory Visit: Payer: Self-pay | Admitting: Family Medicine

## 2018-09-01 DIAGNOSIS — G8929 Other chronic pain: Secondary | ICD-10-CM

## 2018-09-01 DIAGNOSIS — M545 Low back pain, unspecified: Secondary | ICD-10-CM

## 2018-09-01 NOTE — Telephone Encounter (Signed)
Medication refill of oxyCODONE-acetaminophen (PERCOCET) 7.5-325 MG tablet [facility] [place]

## 2018-09-01 NOTE — Telephone Encounter (Signed)
Raymond Rose, any update on this paper chart?

## 2018-09-02 MED ORDER — OXYCODONE-ACETAMINOPHEN 7.5-325 MG PO TABS: 1.0000 | ORAL_TABLET | ORAL | 0 refills | Status: DC | PRN

## 2018-09-03 ENCOUNTER — Other Ambulatory Visit: Payer: Self-pay | Admitting: Family Medicine

## 2018-09-03 DIAGNOSIS — E119 Type 2 diabetes mellitus without complications: Secondary | ICD-10-CM

## 2018-09-05 ENCOUNTER — Other Ambulatory Visit: Payer: Self-pay | Admitting: Family Medicine

## 2018-09-08 ENCOUNTER — Ambulatory Visit: Payer: Self-pay | Admitting: Family Medicine

## 2018-09-13 IMAGING — NM NM GASTRIC EMPTYING
1 series · 10 of 10 positions shown · non-contrast
Comparison: None.

CLINICAL DATA: Nausea and vomiting

EXAM:
NUCLEAR MEDICINE GASTRIC EMPTYING SCAN
TECHNIQUE: After oral ingestion of radiolabeled meal, sequential abdominal
images were obtained for 4 hours. Percentage of activity emptying
the stomach was calculated at 1 hour, 2 hour, 3 hour, and 4 hours.
RADIOPHARMACEUTICALS:  2.37 mCi Zc-UUm sulfur colloid in
standardized meal including egg

[Series 1000: gatric statics (results) · 3.90mm/px · 5 acquisitions, 10 frames shown]
[im 1/5]
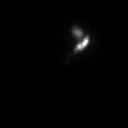
[im 1/5]
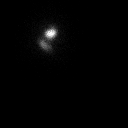
[im 2/5]
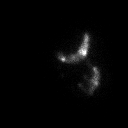
[im 2/5]
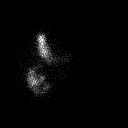
[im 3/5]
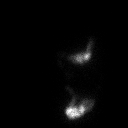
[im 3/5]
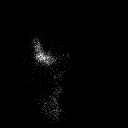
[im 4/5]
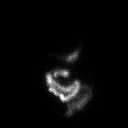
[im 4/5]
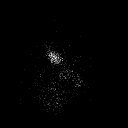
[im 5/5]
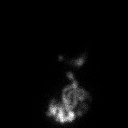
[im 5/5]
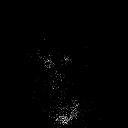

[10 of 10 positions shown; findings below may reference images not displayed]

FINDINGS: Expected location of the stomach in the left upper quadrant.
Ingested meal empties the stomach gradually over the course of the
study.

51% emptied at 1 hr ( normal >= 10%)

64% emptied at 2 hr ( normal >= 40%)

86% emptied at 3 hr ( normal >= 70%)

91% emptied at 4 hr ( normal >= 90%)
IMPRESSION: Normal gastric emptying study.

## 2018-09-15 ENCOUNTER — Ambulatory Visit: Payer: Self-pay | Admitting: Family Medicine

## 2018-09-18 ENCOUNTER — Ambulatory Visit: Payer: Self-pay | Admitting: Family Medicine

## 2018-09-18 ENCOUNTER — Other Ambulatory Visit: Payer: Self-pay

## 2018-09-18 DIAGNOSIS — M545 Low back pain, unspecified: Secondary | ICD-10-CM

## 2018-09-18 DIAGNOSIS — G8929 Other chronic pain: Secondary | ICD-10-CM

## 2018-09-20 MED ORDER — OXYCODONE-ACETAMINOPHEN 7.5-325 MG PO TABS: 1.0000 | ORAL_TABLET | ORAL | 0 refills | Status: DC | PRN

## 2018-09-25 ENCOUNTER — Other Ambulatory Visit: Payer: Self-pay | Admitting: Family Medicine

## 2018-09-25 DIAGNOSIS — E119 Type 2 diabetes mellitus without complications: Secondary | ICD-10-CM

## 2018-09-25 NOTE — Telephone Encounter (Signed)
Please Review

## 2018-10-07 ENCOUNTER — Other Ambulatory Visit: Payer: Self-pay

## 2018-10-07 DIAGNOSIS — M545 Low back pain, unspecified: Secondary | ICD-10-CM

## 2018-10-07 DIAGNOSIS — G8929 Other chronic pain: Secondary | ICD-10-CM

## 2018-10-07 NOTE — Telephone Encounter (Signed)
Patient is requesting a refill on Oxycodone. 

## 2018-10-08 MED ORDER — OXYCODONE-ACETAMINOPHEN 7.5-325 MG PO TABS: 1.0000 | ORAL_TABLET | ORAL | 0 refills | Status: DC | PRN

## 2018-10-20 ENCOUNTER — Ambulatory Visit: Payer: Self-pay | Admitting: Family Medicine

## 2018-10-21 ENCOUNTER — Ambulatory Visit: Payer: Self-pay | Admitting: Family Medicine

## 2018-10-27 ENCOUNTER — Other Ambulatory Visit: Payer: Self-pay

## 2018-10-27 DIAGNOSIS — G8929 Other chronic pain: Secondary | ICD-10-CM

## 2018-10-27 DIAGNOSIS — M545 Low back pain, unspecified: Secondary | ICD-10-CM

## 2018-10-27 MED ORDER — OXYCODONE-ACETAMINOPHEN 7.5-325 MG PO TABS: 1.0000 | ORAL_TABLET | ORAL | 0 refills | Status: DC | PRN

## 2018-10-27 NOTE — Telephone Encounter (Signed)
Patient called requesting a refill on his Percocet. Patient is aware that Dr. Caryn Section will not be back into office until Friday. L.O.V. 08/10/2018.

## 2018-10-30 ENCOUNTER — Ambulatory Visit: Payer: 59 | Admitting: Family Medicine

## 2018-10-30 ENCOUNTER — Encounter: Payer: Self-pay | Admitting: Family Medicine

## 2018-10-30 ENCOUNTER — Other Ambulatory Visit: Payer: Self-pay

## 2018-10-30 VITALS — BP 129/85 | HR 86 | Temp 98.6°F | Wt 359.2 lb

## 2018-10-30 DIAGNOSIS — R1115 Cyclical vomiting syndrome unrelated to migraine: Secondary | ICD-10-CM

## 2018-10-30 DIAGNOSIS — E782 Mixed hyperlipidemia: Secondary | ICD-10-CM

## 2018-10-30 DIAGNOSIS — B372 Candidiasis of skin and nail: Secondary | ICD-10-CM

## 2018-10-30 DIAGNOSIS — J3489 Other specified disorders of nose and nasal sinuses: Secondary | ICD-10-CM | POA: Diagnosis not present

## 2018-10-30 DIAGNOSIS — R112 Nausea with vomiting, unspecified: Secondary | ICD-10-CM | POA: Diagnosis not present

## 2018-10-30 DIAGNOSIS — K219 Gastro-esophageal reflux disease without esophagitis: Secondary | ICD-10-CM

## 2018-10-30 DIAGNOSIS — R740 Nonspecific elevation of levels of transaminase and lactic acid dehydrogenase [LDH]: Secondary | ICD-10-CM

## 2018-10-30 DIAGNOSIS — R7401 Elevation of levels of liver transaminase levels: Secondary | ICD-10-CM

## 2018-10-30 DIAGNOSIS — E119 Type 2 diabetes mellitus without complications: Secondary | ICD-10-CM

## 2018-10-30 MED ORDER — AMITRIPTYLINE HCL 50 MG PO TABS: 50.0000 mg | ORAL_TABLET | Freq: Every day | ORAL | 3 refills | Status: DC

## 2018-10-30 NOTE — Progress Notes (Signed)
Patient: Raymond Rose Male    DOB: Jun 29, 1975   43 y.o.   MRN: [id number] Visit Date: 10/30/2018  Today's Provider: [name], MD   Chief Complaint  Patient presents with  . Diabetes   Subjective:    HPI  Diabetes Mellitus Type II, Follow-up:   Lab Results  Component Value Date   HGBA1C 9.3 (A) 06/08/2018   HGBA1C 9.3 (A) 02/10/2018   HGBA1C 9.5 (A) 01/02/2018    Last seen for diabetes 5 months ago.  Management since then includes increased Glipizide to 10 mg daily. He reports good compliance with treatment. He is not having side effects.  Current symptoms include none and have been stable. Home blood sugar records: n/a  Episodes of hypoglycemia? no   Current Insulin Regimen: n/a Most Recent Eye Exam: 2018 Weight trend: fluctuating a bit Prior visit with dietician: No Current exercise: team sports (play golf) and  Current diet habits: well balanced  Pertinent Labs:    Component Value Date/Time   CHOL 241 (H) 06/08/2018 0904   TRIG 449 (H) 06/08/2018 0904   HDL 30 (L) 06/08/2018 0904   LDLCALC Comment 06/08/2018 0904   CREATININE 0.66 (L) 06/08/2018 0904   CREATININE 0.68 09/07/2014 1206   CREATININE 0.68 09/07/2014 1206    Wt Readings from Last 3 Encounters:  08/10/18 (!) 353 lb (160.1 kg)  06/08/18 (!) 357 lb (161.9 kg)  02/10/18 (!) 375 lb (170.1 kg)    ------------------------------------------------------------------------  Lipid/Cholesterol, Follow-up:   Last seen for this1 years ago.  Management changes since that visit include started Rosuvastatin 10 mg . . Last Lipid Panel:    Component Value Date/Time   CHOL 241 (H) 06/08/2018 0904   TRIG 449 (H) 06/08/2018 0904   HDL 30 (L) 06/08/2018 0904   CHOLHDL 8.0 (H) 06/08/2018 0904   LDLCALC Comment 06/08/2018 0904    Risk factors for vascular disease include   He reports good compliance with treatment. He is not having side effects.  Current symptoms include none and  have been stable. Weight trend: fluctuating a bit Prior visit with dietician: no Current diet: well balanced Current exercise: plays golf  Wt Readings from Last 3 Encounters:  08/10/18 (!) 353 lb (160.1 kg)  06/08/18 (!) 357 lb (161.9 kg)  02/10/18 (!) 375 lb (170.1 kg)    ------------------------------------------------------------------- He also has history of nausea and cyclic vomiting and previously prescribed amitriptyline  For suspected cyclic vomiting syndrome. States initially improved significantly with amitriptyline  But has been having more persistent nausea again the last several weeks.   Allergies  Allergen Reactions  . Bupropion     Extreme anxiety  . Morphine      Current Outpatient Medications:  .  amitriptyline (ELAVIL) 25 MG tablet, TAKE 1 TABLET BY MOUTH EVERYDAY AT BEDTIME, Disp: 30 tablet, Rfl: 3 .  FARXIGA 10 MG TABS tablet, TAKE 1 TABLET BY MOUTH DAILY. NEEDS FOLLOW UP VISIT, Disp: 30 tablet, Rfl: 1 .  glipiZIDE (GLUCOTROL XL) 10 MG 24 hr tablet, TAKE 1 TABLET BY MOUTH DAILY WITH BREAKFAST., Disp: 90 tablet, Rfl: 4 .  glucose blood (ONETOUCH VERIO) test strip, 1 strip daily. Reported on 10/20/2015, Disp: , Rfl:  .  ketoconazole (NIZORAL) 2 % cream, APPLY TO AFFECTED AREA ONCE DAILY FOR 7 DAYS AS NEEDED, Disp: 15 g, Rfl: 3 .  metFORMIN (GLUCOPHAGE-XR) 500 MG 24 hr tablet, Take 1 tablet (500 mg total) by mouth 2 (two) times daily., Disp:  3 tablet, Rfl: 0 .  omeprazole (PRILOSEC) 40 MG capsule, TAKE 1 CAPSULE (40 MG TOTAL) BY MOUTH DAILY., Disp: 30 capsule, Rfl: 11 .  ondansetron (ZOFRAN) 4 MG tablet, Take 1 tablet (4 mg total) by mouth every 8 (eight) hours as needed for nausea or vomiting., Disp: 20 tablet, Rfl: 0 .  oxyCODONE-acetaminophen (PERCOCET) 7.5-325 MG tablet, Take 1 tablet by mouth every 4 (four) hours as needed for severe pain., Disp: 60 tablet, Rfl: 0 .  rosuvastatin (CRESTOR) 10 MG tablet, TAKE 1 TABLET BY MOUTH EVERY DAY, Disp: 30 tablet, Rfl: 5 .   sildenafil (VIAGRA) 50 MG tablet, Take one or two as needed, no more than 2 in a day, Disp: 10 tablet, Rfl: 0 .  venlafaxine XR (EFFEXOR-XR) 150 MG 24 hr capsule, TAKE 1 CAPSULE (150 MG TOTAL) BY MOUTH DAILY WITH BREAKFAST., Disp: 30 capsule, Rfl: 4  Review of Systems  Constitutional: Negative.   Respiratory: Negative.   Cardiovascular: Negative.   Genitourinary: Negative.   Neurological: Negative.   Psychiatric/Behavioral: Negative.     Social History   Tobacco Use  . Smoking status: Former Smoker    Years: 5.00    Last attempt to quit: 06/17/1997    Years since quitting: 21.3  . Smokeless tobacco: Never Used  . Tobacco comment: started smoking at [age]he 16, and quit at age 11  Substance Use Topics  . Alcohol use: Yes    Alcohol/week: 0.0 standard drinks    Comment: occasional alcohol use      Objective:   BP 129/85 (BP Location: Left Arm, Patient Position: Sitting, Cuff Size: Large)   Pulse 86   Temp 98.6 F (37 C) (Oral)   Wt (!) 359 lb 3.2 oz (162.9 kg)   BMI 48.72 kg/m     Physical Exam  General Appearance:    Alert, cooperative, no distress, morbidly obese  Eyes:    PERRL, conjunctiva/corneas clear, EOM's intact       Lungs:     Clear to auscultation bilaterally, respirations unlabored  Heart:    Regular rate and rhythm  Neurologic:   Awake, alert, oriented x 3. No apparent focal neurological           defect.            Assessment & Plan    1. Cyclic vomiting syndrome Initially responded well to amitriptyline, but worsening again, will double dose to- amitriptyline (ELAVIL) 50 MG tablet; Take 1 tablet (50 mg total) by mouth at bedtime.  Dispense: 30 tablet; Refill: 3  2. Nasal drainage OTC loratadine and continue fluticasone nasal spray.   3. Nausea and vomiting, intractability of vomiting not specified, unspecified vomiting type  - amitriptyline (ELAVIL) 50 MG tablet; Take 1 tablet (50 mg total) by mouth at bedtime.  Dispense: 30 tablet; Refill: 3  4.  Gastroesophageal reflux disease without esophagitis Well controlled on omeprazole.   5. Type 2 diabetes mellitus without complication, without long-term current use of insulin (HCC)  - Hemoglobin A1c  6. Elevated transaminase level Likely fatty liver. Monitor lfts periodically.   7. Hyperlipidemia, mixed He is tolerating rosuvastatin well with no adverse effects.   - Lipid panel - Comprehensive metabolic panel  8. Skin yeast infection Does well with prn ketoconazole cream.     The entirety of the information documented in the History of Present Illness, Review of Systems and Physical Exam were personally obtained by me. Portions of this information were initially documented by [facility] CMA,and  reviewed by me for thoroughness and accuracy.   [name], MD  [facility]

## 2018-10-30 NOTE — Patient Instructions (Signed)
.   Please review the attached list of medications and notify my office if there are any errors.   . Please bring all of your medications to every appointment so we can make sure that our medication list is the same as yours.   

## 2018-10-31 LAB — COMPREHENSIVE METABOLIC PANEL
ALT: 21 IU/L (ref 0–44)
AST: 30 IU/L (ref 0–40)
Albumin/Globulin Ratio: 1.7 (ref 1.2–2.2)
Albumin: 4.7 g/dL (ref 4.0–5.0)
Alkaline Phosphatase: 92 IU/L (ref 39–117)
BUN/Creatinine Ratio: 18 (ref 9–20)
BUN: 14 mg/dL (ref 6–24)
Bilirubin Total: 0.4 mg/dL (ref 0.0–1.2)
CO2: 24 mmol/L (ref 20–29)
Calcium: 10 mg/dL (ref 8.7–10.2)
Chloride: 100 mmol/L (ref 96–106)
Creatinine, Ser: 0.76 mg/dL (ref 0.76–1.27)
GFR calc Af Amer: 132 mL/min/{1.73_m2} (ref >59)
GFR calc non Af Amer: 114 mL/min/{1.73_m2} (ref >59)
Globulin, Total: 2.7 g/dL (ref 1.5–4.5)
Glucose: 260 mg/dL — ABNORMAL HIGH (ref 65–99)
Potassium: 4.4 mmol/L (ref 3.5–5.2)
Sodium: 142 mmol/L (ref 134–144)
Total Protein: 7.4 g/dL (ref 6.0–8.5)

## 2018-10-31 LAB — HEMOGLOBIN A1C
Est. average glucose Bld gHb Est-mCnc: 209 mg/dL
Hgb A1c MFr Bld: 8.9 % — ABNORMAL HIGH (ref 4.8–5.6)

## 2018-10-31 LAB — LIPID PANEL
Chol/HDL Ratio: 6.5 ratio — ABNORMAL HIGH (ref 0.0–5.0)
Cholesterol, Total: 188 mg/dL (ref 100–199)
HDL: 29 mg/dL — ABNORMAL LOW (ref >39)
Triglycerides: 534 mg/dL — ABNORMAL HIGH (ref 0–149)

## 2018-11-02 ENCOUNTER — Telehealth: Payer: Self-pay

## 2018-11-02 NOTE — Telephone Encounter (Signed)
-----   Message from Birdie Sons, MD sent at 11/02/2018  9:04 AM EDT ----- Cholesterol is much better, but riglycerides are still very high at 534, need to increase rosuvastatin to 20mg  daily, #90 rf x 1.  Schedule follow up for diabetes and lipids in 3 months.

## 2018-11-02 NOTE — Telephone Encounter (Signed)
Left message to call back  

## 2018-11-03 ENCOUNTER — Other Ambulatory Visit: Payer: Self-pay

## 2018-11-03 MED ORDER — ROSUVASTATIN CALCIUM 20 MG PO TABS: 20.0000 mg | ORAL_TABLET | Freq: Every day | ORAL | 1 refills | Status: DC

## 2018-11-04 NOTE — Telephone Encounter (Signed)
Pt advised.   Thanks,   -Laura  

## 2018-11-04 NOTE — Telephone Encounter (Signed)
Tried calling patient. Left message to call back. 

## 2018-11-12 ENCOUNTER — Other Ambulatory Visit: Payer: Self-pay | Admitting: Family Medicine

## 2018-11-12 DIAGNOSIS — E119 Type 2 diabetes mellitus without complications: Secondary | ICD-10-CM

## 2018-11-13 ENCOUNTER — Other Ambulatory Visit: Payer: Self-pay

## 2018-11-13 DIAGNOSIS — G8929 Other chronic pain: Secondary | ICD-10-CM

## 2018-11-13 DIAGNOSIS — M545 Low back pain, unspecified: Secondary | ICD-10-CM

## 2018-11-13 MED ORDER — OXYCODONE-ACETAMINOPHEN 7.5-325 MG PO TABS: 1.0000 | ORAL_TABLET | ORAL | 0 refills | Status: DC | PRN

## 2018-11-13 NOTE — Telephone Encounter (Signed)
Patient is requesting medication refill for Oxycodone.

## 2018-12-01 ENCOUNTER — Other Ambulatory Visit: Payer: Self-pay | Admitting: Family Medicine

## 2018-12-01 DIAGNOSIS — G8929 Other chronic pain: Secondary | ICD-10-CM

## 2018-12-01 DIAGNOSIS — M545 Low back pain, unspecified: Secondary | ICD-10-CM

## 2018-12-01 NOTE — Telephone Encounter (Signed)
Pt needs refill on  Oxycodone  7.25/325  [facility] [place]  Thanks  [employer]

## 2018-12-02 MED ORDER — OXYCODONE-ACETAMINOPHEN 7.5-325 MG PO TABS: 1.0000 | ORAL_TABLET | ORAL | 0 refills | Status: DC | PRN

## 2018-12-11 ENCOUNTER — Other Ambulatory Visit: Payer: Self-pay | Admitting: Family Medicine

## 2018-12-17 ENCOUNTER — Other Ambulatory Visit: Payer: Self-pay | Admitting: Family Medicine

## 2018-12-17 DIAGNOSIS — G8929 Other chronic pain: Secondary | ICD-10-CM

## 2018-12-17 DIAGNOSIS — M545 Low back pain, unspecified: Secondary | ICD-10-CM

## 2018-12-17 MED ORDER — OXYCODONE-ACETAMINOPHEN 7.5-325 MG PO TABS: 1.0000 | ORAL_TABLET | ORAL | 0 refills | Status: DC | PRN

## 2018-12-17 NOTE — Telephone Encounter (Signed)
Please review Dr.Fisher patient. Thanks 

## 2018-12-17 NOTE — Telephone Encounter (Signed)
Pt needs refill on his oxycodone 7.5/325  [facility]  Thanks teri

## 2018-12-31 ENCOUNTER — Other Ambulatory Visit: Payer: Self-pay | Admitting: Family Medicine

## 2018-12-31 DIAGNOSIS — M545 Low back pain, unspecified: Secondary | ICD-10-CM

## 2018-12-31 DIAGNOSIS — G8929 Other chronic pain: Secondary | ICD-10-CM

## 2018-12-31 NOTE — Telephone Encounter (Signed)
Pt needs refill on his Oxycodone 7.5/325  [facility]

## 2019-01-01 MED ORDER — OXYCODONE-ACETAMINOPHEN 7.5-325 MG PO TABS: 1.0000 | ORAL_TABLET | ORAL | 0 refills | Status: DC | PRN

## 2019-01-08 ENCOUNTER — Other Ambulatory Visit: Payer: Self-pay | Admitting: Family Medicine

## 2019-01-15 ENCOUNTER — Other Ambulatory Visit: Payer: Self-pay | Admitting: Family Medicine

## 2019-01-15 DIAGNOSIS — G8929 Other chronic pain: Secondary | ICD-10-CM

## 2019-01-15 DIAGNOSIS — M545 Low back pain, unspecified: Secondary | ICD-10-CM

## 2019-01-15 NOTE — Telephone Encounter (Signed)
Pt needing refill on:  oxyCODONE-acetaminophen (PERCOCET) 7.5-325 MG tablet   Please fill at:  [id number] [place] [place] (Phone) [phone] (Fax)     Thanks, [employer]

## 2019-01-18 MED ORDER — OXYCODONE-ACETAMINOPHEN 7.5-325 MG PO TABS: 1.0000 | ORAL_TABLET | ORAL | 0 refills | Status: DC | PRN

## 2019-01-18 NOTE — Telephone Encounter (Signed)
Pt calling back on status of refill of oxyCODONE-acetaminophen (PERCOCET) 7.5-325 MG tablet.  Thanks, [employer]

## 2019-02-01 ENCOUNTER — Ambulatory Visit: Payer: Self-pay | Admitting: Family Medicine

## 2019-02-01 ENCOUNTER — Other Ambulatory Visit: Payer: Self-pay | Admitting: Family Medicine

## 2019-02-01 DIAGNOSIS — M545 Low back pain, unspecified: Secondary | ICD-10-CM

## 2019-02-01 DIAGNOSIS — G8929 Other chronic pain: Secondary | ICD-10-CM

## 2019-02-01 NOTE — Telephone Encounter (Signed)
Pt needs a refill on   Oxycodone  7.5-325  Prednisone 10 mg for headaches  [facility]  CB#  [phone]  teri

## 2019-02-02 MED ORDER — OXYCODONE-ACETAMINOPHEN 7.5-325 MG PO TABS: 1.0000 | ORAL_TABLET | ORAL | 0 refills | Status: DC | PRN

## 2019-02-03 ENCOUNTER — Telehealth: Payer: Self-pay | Admitting: Family Medicine

## 2019-02-03 ENCOUNTER — Ambulatory Visit: Payer: Self-pay | Admitting: Family Medicine

## 2019-02-03 MED ORDER — PREDNISONE 10 MG PO TABS: ORAL_TABLET | ORAL | 1 refills | Status: DC

## 2019-02-03 NOTE — Telephone Encounter (Signed)
Pt needing a call back on Prednisone 10 mg for headaches.  Pt is going on vacation and needing some for headaches.  Pt asked in a previous message.  Please advise if he can get this filled.  Thanks, [employer]

## 2019-02-03 NOTE — Telephone Encounter (Signed)
OK, have sent refill for prednisone.

## 2019-02-03 NOTE — Telephone Encounter (Signed)
Pt advised.   Thanks,   -Shaneil Yazdi  

## 2019-02-07 ENCOUNTER — Other Ambulatory Visit: Payer: Self-pay | Admitting: Family Medicine

## 2019-02-07 DIAGNOSIS — R1115 Cyclical vomiting syndrome unrelated to migraine: Secondary | ICD-10-CM

## 2019-02-07 DIAGNOSIS — R112 Nausea with vomiting, unspecified: Secondary | ICD-10-CM

## 2019-02-17 ENCOUNTER — Other Ambulatory Visit: Payer: Self-pay

## 2019-02-17 ENCOUNTER — Ambulatory Visit (INDEPENDENT_AMBULATORY_CARE_PROVIDER_SITE_OTHER): Payer: 59 | Admitting: Family Medicine

## 2019-02-17 ENCOUNTER — Encounter: Payer: Self-pay | Admitting: Family Medicine

## 2019-02-17 VITALS — BP 131/82 | HR 84 | Temp 97.2°F | Resp 16 | Ht 72.0 in | Wt 359.0 lb

## 2019-02-17 DIAGNOSIS — E119 Type 2 diabetes mellitus without complications: Secondary | ICD-10-CM

## 2019-02-17 DIAGNOSIS — Z23 Encounter for immunization: Secondary | ICD-10-CM | POA: Diagnosis not present

## 2019-02-17 DIAGNOSIS — F32A Depression, unspecified: Secondary | ICD-10-CM

## 2019-02-17 DIAGNOSIS — F329 Major depressive disorder, single episode, unspecified: Secondary | ICD-10-CM | POA: Diagnosis not present

## 2019-02-17 LAB — POCT GLYCOSYLATED HEMOGLOBIN (HGB A1C)
Est. average glucose Bld gHb Est-mCnc: 203
Hemoglobin A1C: 8.7 % — AB (ref 4.0–5.6)

## 2019-02-17 MED ORDER — VENLAFAXINE HCL ER 75 MG PO CP24: 75.0000 mg | ORAL_CAPSULE | Freq: Every day | ORAL | 0 refills | Status: DC

## 2019-02-17 MED ORDER — TRULICITY 0.75 MG/0.5ML ~~LOC~~ SOAJ: 0.7500 mg | SUBCUTANEOUS | 0 refills | Status: DC

## 2019-02-17 NOTE — Patient Instructions (Signed)
.   Please review the attached list of medications and notify my office if there are any errors.   . Please bring all of your medications to every appointment so we can make sure that our medication list is the same as yours.   . It is especially important to get the annual flu vaccine this year. If you haven't had it already, please go to your pharmacy or call the office as soon as possible to schedule you flu shot.  

## 2019-02-17 NOTE — Progress Notes (Signed)
Patient: Raymond Rose Male    DOB: [birth year]   43 y.o.   MRN: [id number] Visit Date: 02/17/2019  Today's Provider: [name], MD   Chief Complaint  Patient presents with  . Follow-up  . Diabetes  . Hyperlipidemia   Subjective:     HPI    Diabetes Mellitus Type II, Follow-up:   Lab Results  Component Value Date   HGBA1C 8.9 (H) 10/30/2018   HGBA1C 9.3 (A) 06/08/2018   HGBA1C 9.3 (A) 02/10/2018   Last seen for diabetes 4 months ago.  Management since then includes; labs checked, no changes. He reports good compliance with treatment. He is not having side effects. none Current symptoms include none and have been unchanged. Home blood sugar records: fasting range: not checking due to machine being broke  Episodes of hypoglycemia? no   Current Insulin Regimen: n/a Most Recent Eye Exam: 2019 [facility]  Weight trend: stable Prior visit with dietician: no Current diet: well balanced Current exercise: golfing  -----------------------------------------------------------   Lipid/Cholesterol, Follow-up:   Last seen for this 4 months ago.  Management since that visit includes; labs checked. Increased rosuvastatin to 20mg  daily.  Last Lipid Panel:    Component Value Date/Time   CHOL 188 10/30/2018 0915   TRIG 534 (H) 10/30/2018 0915   HDL 29 (L) 10/30/2018 0915   CHOLHDL 6.5 (H) 10/30/2018 0915   [place] Comment 10/30/2018 0915    He reports good compliance with treatment. He is not having side effects. none  Wt Readings from Last 3 Encounters:  02/17/19 (!) 359 lb (162.8 kg)  10/30/18 (!) 359 lb 3.2 oz (162.9 kg)  08/10/18 (!) 353 lb (160.1 kg)    -----------------------------------------------------------  He also reports he has continues to get recurrent irritation of penis which usually clears with ketoconazole.  He would also like to adjust his antidepressant as current dose of venlafaxine doesn't seem to be effective anymore.    Allergies  Allergen Reactions  . Bupropion     Extreme anxiety  . Morphine      Current Outpatient Medications:  .  amitriptyline (ELAVIL) 50 MG tablet, TAKE 1 TABLET BY MOUTH EVERYDAY AT BEDTIME, Disp: 30 tablet, Rfl: 5 .  dapagliflozin propanediol (FARXIGA) 10 MG TABS tablet, Take 10 mg by mouth daily., Disp: 30 tablet, Rfl: 3 .  glipiZIDE (GLUCOTROL XL) 10 MG 24 hr tablet, TAKE 1 TABLET BY MOUTH DAILY WITH BREAKFAST., Disp: 90 tablet, Rfl: 4 .  glucose blood (ONETOUCH VERIO) test strip, 1 strip daily. Reported on 10/20/2015, Disp: , Rfl:  .  ketoconazole (NIZORAL) 2 % cream, APPLY TO AFFECTED AREA ONCE DAILY FOR 7 DAYS AS NEEDED, Disp: 15 g, Rfl: 3 .  metFORMIN (GLUCOPHAGE-XR) 500 MG 24 hr tablet, TAKE 2 TABLETS BY MOUTH TWICE A DAY, Disp: 120 tablet, Rfl: 3 .  omeprazole (PRILOSEC) 40 MG capsule, TAKE 1 CAPSULE (40 MG TOTAL) BY MOUTH DAILY., Disp: 30 capsule, Rfl: 11 .  ondansetron (ZOFRAN) 4 MG tablet, Take 1 tablet (4 mg total) by mouth every 8 (eight) hours as needed for nausea or vomiting., Disp: 20 tablet, Rfl: 0 .  oxyCODONE-acetaminophen (PERCOCET) 7.5-325 MG tablet, Take 1 tablet by mouth every 4 (four) hours as needed for severe pain., Disp: 60 tablet, Rfl: 0 .  predniSONE (DELTASONE) 10 MG tablet, 6 tablets for 2 days, then 5 for 2 days, then 4 for 2 days, then 3 for 2 days, then 2 for 2 days, then  1 for 2 days., Disp: 42 tablet, Rfl: 1 .  rosuvastatin (CRESTOR) 20 MG tablet, Take 1 tablet (20 mg total) by mouth daily., Disp: 90 tablet, Rfl: 1 .  sildenafil (VIAGRA) 50 MG tablet, Take one or two as needed, no more than 2 in a day, Disp: 10 tablet, Rfl: 0 .  venlafaxine XR (EFFEXOR-XR) 150 MG 24 hr capsule, TAKE 1 CAPSULE (150 MG TOTAL) BY MOUTH DAILY WITH BREAKFAST., Disp: 30 capsule, Rfl: 12 .  rosuvastatin (CRESTOR) 10 MG tablet, TAKE 1 TABLET BY MOUTH EVERY DAY (Patient not taking: Reported on 02/17/2019), Disp: 30 tablet, Rfl: 5  Review of Systems  Constitutional: Negative for  appetite change, chills and fever.  Respiratory: Negative for chest tightness, shortness of breath and wheezing.   Cardiovascular: Negative for chest pain and palpitations.  Gastrointestinal: Negative for abdominal pain, nausea and vomiting.    Social History   Tobacco Use  . Smoking status: Former Smoker    Years: 5.00    Quit date: 06/17/1997    Years since quitting: 21.6  . Smokeless tobacco: Never Used  . Tobacco comment: started smoking at [age]he 16, and quit at age 3  Substance Use Topics  . Alcohol use: Yes    Alcohol/week: 0.0 standard drinks    Comment: occasional alcohol use      Objective:   BP 131/82 (BP Location: Right Arm, Patient Position: Sitting, Cuff Size: Large)   Pulse 84   Temp (!) 97.2 F (36.2 C) (Other (Comment))   Resp 16   Ht 6' (1.829 m)   Wt (!) 359 lb (162.8 kg)   SpO2 97%   BMI 48.69 kg/m    Physical Exam  General appearance: alert, well developed, morbidly obese, cooperative and in no distress Head: Normocephalic, without obvious abnormality, atraumatic Respiratory: Respirations even and unlabored, normal respiratory rate Extremities: All extremities are intact.  Skin: Skin color, texture, turgor normal. No rashes seen  Psych: Appropriate mood and affect. Neurologic: Mental status: Alert, oriented to person, place, and time, thought content appropriate.   Results for orders placed or performed in visit on 02/17/19  POCT glycosylated hemoglobin (Hb A1C)  Result Value Ref Range   Hemoglobin A1C 8.7 (A) 4.0 - 5.6 %   Est. average glucose Bld gHb Est-mCnc 203        Assessment & Plan    1. Type 2 diabetes mellitus without complication, without long-term current use of insulin (HCC) Not to goal. He did have trial of Trulicity last year which he tolerated, but was not covered by insurance. Will start- Dulaglutide (TRULICITY) A999333 0000000 SOPN; Inject 0.75 mg into the skin once a week.  Dispense: 4 pen; Refill: 0, if tolerating will  increase to 1.5 after the first month..  2. Morbid Obesity. Expect some weight loss with addition of Trulicity.   3. Depression Not controlled, will add additional 75mg  to venlafaxine to make 225 per day, and do telephone follow up in a month.   Flu vaccine given today.   The entirety of the information documented in the History of Present Illness, Review of Systems and Physical Exam were personally obtained by me. Portions of this information were initially documented by April M. Sabra Heck, CMA and reviewed by me for thoroughness and accuracy.      [name], MD  [facility]

## 2019-02-18 ENCOUNTER — Other Ambulatory Visit: Payer: Self-pay | Admitting: Family Medicine

## 2019-02-18 DIAGNOSIS — M545 Low back pain, unspecified: Secondary | ICD-10-CM

## 2019-02-18 DIAGNOSIS — G8929 Other chronic pain: Secondary | ICD-10-CM

## 2019-02-18 NOTE — Telephone Encounter (Signed)
Patient called requesting refill on Oxycodone 7.5/325 mg. Sent to [facility]

## 2019-02-19 MED ORDER — OXYCODONE-ACETAMINOPHEN 7.5-325 MG PO TABS: 1.0000 | ORAL_TABLET | ORAL | 0 refills | Status: DC | PRN

## 2019-02-25 ENCOUNTER — Other Ambulatory Visit: Payer: Self-pay | Admitting: Family Medicine

## 2019-02-25 DIAGNOSIS — N529 Male erectile dysfunction, unspecified: Secondary | ICD-10-CM

## 2019-03-03 ENCOUNTER — Other Ambulatory Visit: Payer: Self-pay

## 2019-03-03 DIAGNOSIS — M545 Low back pain, unspecified: Secondary | ICD-10-CM

## 2019-03-03 DIAGNOSIS — G8929 Other chronic pain: Secondary | ICD-10-CM

## 2019-03-04 ENCOUNTER — Telehealth: Payer: Self-pay | Admitting: Family Medicine

## 2019-03-04 NOTE — Telephone Encounter (Signed)
Pt needing the refill on:  oxyCODONE-acetaminophen (PERCOCET) 7.5-325 MG tablet  Pt called this in earlier this week needing the refill.  Please fill at:  [id number] [place] [place] [phone] (Phone) [phone] (Fax)    Thanks, [employer]

## 2019-03-05 MED ORDER — OXYCODONE-ACETAMINOPHEN 7.5-325 MG PO TABS: 1.0000 | ORAL_TABLET | ORAL | 0 refills | Status: DC | PRN

## 2019-03-12 ENCOUNTER — Other Ambulatory Visit: Payer: Self-pay | Admitting: Family Medicine

## 2019-03-12 ENCOUNTER — Telehealth: Payer: Self-pay | Admitting: Family Medicine

## 2019-03-12 MED ORDER — TRULICITY 1.5 MG/0.5ML ~~LOC~~ SOAJ: 1.5000 mg | SUBCUTANEOUS | 3 refills | Status: DC

## 2019-03-12 NOTE — Telephone Encounter (Signed)
Pharmacist was notified. KW

## 2019-03-12 NOTE — Telephone Encounter (Signed)
Ok, we're going to go up to 1.5mg  of trulicity. please call [facility] and cancel the 0.75mg  prescription. Have sent new prescription for 1.5mg 

## 2019-03-12 NOTE — Telephone Encounter (Signed)
Patient states that he is tolerating Trulicty just fine is on day 4 of medication. Patient he has not noticed a difference on increased dose of Venlafaxine. Did you want me to send in new prescription for Truclicity? If so what is sig? And number of refills? KW

## 2019-03-12 NOTE — Telephone Encounter (Signed)
Please check with patient and see if he is tolerating Trulicity. If so then we can go up to 1.5mg  prescription.   Also see how he is doing with increased dose of venlafaxine. If doing better I'll send in new prescription for higher dose.

## 2019-03-13 ENCOUNTER — Other Ambulatory Visit: Payer: Self-pay | Admitting: Family Medicine

## 2019-03-17 ENCOUNTER — Other Ambulatory Visit: Payer: Self-pay | Admitting: Family Medicine

## 2019-03-17 DIAGNOSIS — G8929 Other chronic pain: Secondary | ICD-10-CM

## 2019-03-17 DIAGNOSIS — M545 Low back pain, unspecified: Secondary | ICD-10-CM

## 2019-03-17 NOTE — Telephone Encounter (Signed)
Pt needs  Refill  Oxycodone  7.5/325  [facility]

## 2019-03-18 ENCOUNTER — Other Ambulatory Visit: Payer: Self-pay | Admitting: Family Medicine

## 2019-03-18 DIAGNOSIS — E119 Type 2 diabetes mellitus without complications: Secondary | ICD-10-CM

## 2019-03-19 NOTE — Telephone Encounter (Signed)
Please review. Thanks!  

## 2019-03-19 NOTE — Telephone Encounter (Signed)
Pt inquired about his pain medication being sent to the pharmacy

## 2019-03-21 ENCOUNTER — Other Ambulatory Visit: Payer: Self-pay | Admitting: Family Medicine

## 2019-03-21 DIAGNOSIS — N5089 Other specified disorders of the male genital organs: Secondary | ICD-10-CM

## 2019-03-21 MED ORDER — OXYCODONE-ACETAMINOPHEN 7.5-325 MG PO TABS: 1.0000 | ORAL_TABLET | ORAL | 0 refills | Status: DC | PRN

## 2019-03-31 ENCOUNTER — Other Ambulatory Visit: Payer: Self-pay | Admitting: Family Medicine

## 2019-04-05 ENCOUNTER — Other Ambulatory Visit: Payer: Self-pay | Admitting: Family Medicine

## 2019-04-05 DIAGNOSIS — M545 Low back pain, unspecified: Secondary | ICD-10-CM

## 2019-04-05 DIAGNOSIS — G8929 Other chronic pain: Secondary | ICD-10-CM

## 2019-04-05 NOTE — Telephone Encounter (Signed)
Refill Oxycodone 7.5-375  [facility]

## 2019-04-07 MED ORDER — OXYCODONE-ACETAMINOPHEN 7.5-325 MG PO TABS: 1.0000 | ORAL_TABLET | ORAL | 0 refills | Status: DC | PRN

## 2019-04-08 ENCOUNTER — Telehealth (INDEPENDENT_AMBULATORY_CARE_PROVIDER_SITE_OTHER): Payer: 59 | Admitting: Physician Assistant

## 2019-04-08 ENCOUNTER — Encounter: Payer: Self-pay | Admitting: Physician Assistant

## 2019-04-08 DIAGNOSIS — R112 Nausea with vomiting, unspecified: Secondary | ICD-10-CM

## 2019-04-08 DIAGNOSIS — E119 Type 2 diabetes mellitus without complications: Secondary | ICD-10-CM

## 2019-04-08 MED ORDER — TRULICITY 0.75 MG/0.5ML ~~LOC~~ SOAJ: 0.7500 mg | SUBCUTANEOUS | 2 refills | Status: DC

## 2019-04-08 NOTE — Progress Notes (Signed)
Patient: Raymond Rose Male    DOB: Jan 22, 1976   43 y.o.   MRN: [id number] Visit Date: 04/08/2019  Today's Provider: Trinna Post, PA-C   Chief Complaint  Patient presents with  . Vomiting    x 2 weeks   Subjective:    Virtual Visit via Video Note  I connected with Jocqui Dixon Lardizabal on 04/08/19 at  8:20 AM EDT by a video enabled telemedicine application and verified that I am speaking with the correct person using two identifiers.   I discussed the limitations of evaluation and management by telemedicine and the availability of in person appointments. The patient expressed understanding and agreed to proceed.   Emesis  This is a new problem. Episode onset: 2 weeks ago. The problem has been unchanged. Maximum temperature: mild fever 1 week ago. Associated symptoms include abdominal pain, arthralgias, diarrhea, a fever (low grade per patient report 1 week ago) and myalgias. Pertinent negatives include no chest pain, chills, coughing or URI.  Two weeks ago patient was exposed to someone who was positive for COVID-19. Patient reports that he was tested for COVID 1 week ago, and his result was negative. He has felt consistently nauseated and vomited every day. He has diarrhea that is nonbloody. He has not been hospitalizaed or on antibiotics in the past three months.   On 99991111 his trulicity was increased to 1.5 mg and he took his first injection a week later. He has used three pens so far.   Allergies  Allergen Reactions  . Bupropion     Extreme anxiety  . Morphine      Current Outpatient Medications:  .  amitriptyline (ELAVIL) 50 MG tablet, TAKE 1 TABLET BY MOUTH EVERYDAY AT BEDTIME, Disp: 30 tablet, Rfl: 5 .  Dulaglutide (TRULICITY) 1.5 0000000 SOPN, Inject 1.5 mg into the skin once a week., Disp: 4 pen, Rfl: 3 .  FARXIGA 10 MG TABS tablet, TAKE 1 TABLET BY MOUTH DAILY, Disp: 30 tablet, Rfl: 6 .  glipiZIDE (GLUCOTROL XL) 10 MG 24 hr tablet, TAKE 1 TABLET BY MOUTH  DAILY WITH BREAKFAST., Disp: 90 tablet, Rfl: 4 .  glucose blood (ONETOUCH VERIO) test strip, 1 strip daily. Reported on 10/20/2015, Disp: , Rfl:  .  metFORMIN (GLUCOPHAGE-XR) 500 MG 24 hr tablet, TAKE 2 TABLETS BY MOUTH TWICE A DAY, Disp: 120 tablet, Rfl: 3 .  omeprazole (PRILOSEC) 40 MG capsule, TAKE 1 CAPSULE (40 MG TOTAL) BY MOUTH DAILY., Disp: 30 capsule, Rfl: 11 .  ondansetron (ZOFRAN) 4 MG tablet, Take 1 tablet (4 mg total) by mouth every 8 (eight) hours as needed for nausea or vomiting., Disp: 20 tablet, Rfl: 0 .  oxyCODONE-acetaminophen (PERCOCET) 7.5-325 MG tablet, Take 1 tablet by mouth every 4 (four) hours as needed for severe pain., Disp: 60 tablet, Rfl: 0 .  rosuvastatin (CRESTOR) 10 MG tablet, TAKE 1 TABLET BY MOUTH EVERY DAY, Disp: 30 tablet, Rfl: 3 .  sildenafil (VIAGRA) 50 MG tablet, TAKE ONE OR TWO TABLETS AS NEEDED, NO MORE THAN 2 IN A DAY INSURANCE COVERS 6 TAB IN 25 DAYS, Disp: 6 tablet, Rfl: 5 .  venlafaxine XR (EFFEXOR-XR) 150 MG 24 hr capsule, TAKE 1 CAPSULE (150 MG TOTAL) BY MOUTH DAILY WITH BREAKFAST., Disp: 30 capsule, Rfl: 12 .  venlafaxine XR (EFFEXOR-XR) 75 MG 24 hr capsule, TAKE 1 CAPSULE (75 MG TOTAL) BY MOUTH DAILY WITH BREAKFAST. TAKE IN ADDITION TO 150MG  CAPSULE, Disp: 30 capsule, Rfl: 5 .  fluconazole (DIFLUCAN) 150 MG tablet, TAKE 1 TABLET BY MOUTH AS ONE DOSE (Patient not taking: Reported on 04/08/2019), Disp: 1 tablet, Rfl: 5 .  ketoconazole (NIZORAL) 2 % cream, APPLY TO AFFECTED AREA ONCE DAILY FOR 7 DAYS AS NEEDED (Patient not taking: Reported on 04/08/2019), Disp: 15 g, Rfl: 3 .  TRULICITY A999333 0000000 SOPN, INJECT 0.75 MG INTO THE SKIN ONCE A WEEK. (Patient not taking: Reported on 04/08/2019), Disp: 4 pen, Rfl: 12  Review of Systems  Constitutional: Positive for fatigue and fever (low grade per patient report 1 week ago). Negative for appetite change and chills.  Respiratory: Negative for cough, chest tightness, shortness of breath and wheezing.     Cardiovascular: Negative for chest pain and palpitations.  Gastrointestinal: Positive for abdominal pain, diarrhea, nausea and vomiting.  Musculoskeletal: Positive for arthralgias and myalgias.    Social History   Tobacco Use  . Smoking status: Former Smoker    Years: 5.00    Quit date: 06/17/1997    Years since quitting: 21.8  . Smokeless tobacco: Never Used  . Tobacco comment: started smoking at [age]he 16, and quit at age 73  Substance Use Topics  . Alcohol use: Yes    Alcohol/week: 0.0 standard drinks    Comment: occasional alcohol use      Objective:   There were no vitals taken for this visit. There were no vitals filed for this visit.There is no height or weight on file to calculate BMI.   Physical Exam Constitutional:      Appearance: Normal appearance. He is not ill-appearing.  Neurological:     Mental Status: He is alert.  Psychiatric:        Mood and Affect: Mood normal.        Behavior: Behavior normal.      No results found for any visits on 04/08/19.     Assessment & Plan    1. Nausea and vomiting, intractability of vomiting not specified, unspecified vomiting type  Patient has had several weeks of nausea, vomiting, and diarrhea. Does not have risk factors for c. Diff and I feel this is a long course for infectious etiology. Tested negative for COVID last week. His symptoms coincide with increase in Trulicity and I think he may be having side effects from larger dose. Suggest he decrease back to 0.75 mg SubQ weekly as he tolerate this dose. Advised to follow up with Dr. Caryn Section in 2 months for diabetes recheck.   2. Type 2 diabetes mellitus without complication, without long-term current use of insulin (HCC)  - Dulaglutide (TRULICITY) A999333 0000000 SOPN; Inject 0.75 mg into the skin once a week.  Dispense: 4 pen; Refill: 2  The entirety of the information documented in the History of Present Illness, Review of Systems and Physical Exam were personally  obtained by me. Portions of this information were initially documented by Meyer Cory, CMA and reviewed by me for thoroughness and accuracy.   F/u 2 months for DM with Dr. Kai Levins, PA-C  [facility]

## 2019-04-16 ENCOUNTER — Ambulatory Visit (INDEPENDENT_AMBULATORY_CARE_PROVIDER_SITE_OTHER): Payer: 59 | Admitting: Family Medicine

## 2019-04-16 ENCOUNTER — Encounter: Payer: Self-pay | Admitting: Family Medicine

## 2019-04-16 ENCOUNTER — Other Ambulatory Visit: Payer: Self-pay

## 2019-04-16 VITALS — BP 132/86 | HR 103 | Temp 95.9°F | Wt 345.2 lb

## 2019-04-16 DIAGNOSIS — F419 Anxiety disorder, unspecified: Secondary | ICD-10-CM

## 2019-04-16 DIAGNOSIS — F410 Panic disorder [episodic paroxysmal anxiety] without agoraphobia: Secondary | ICD-10-CM | POA: Diagnosis not present

## 2019-04-16 DIAGNOSIS — F329 Major depressive disorder, single episode, unspecified: Secondary | ICD-10-CM

## 2019-04-16 DIAGNOSIS — E119 Type 2 diabetes mellitus without complications: Secondary | ICD-10-CM

## 2019-04-16 DIAGNOSIS — E782 Mixed hyperlipidemia: Secondary | ICD-10-CM

## 2019-04-16 DIAGNOSIS — F32A Depression, unspecified: Secondary | ICD-10-CM

## 2019-04-16 MED ORDER — LAMOTRIGINE 25 MG PO TABS: ORAL_TABLET | ORAL | 0 refills | Status: DC

## 2019-04-16 NOTE — Progress Notes (Signed)
Patient: Raymond Rose Male    DOB: 08-03-1975   43 y.o.   MRN: [id number] Visit Date: 04/16/2019  Today's Provider: [name], MD   Chief Complaint  Patient presents with  . Depression  . Anxiety  . Gastroesophageal Reflux   Subjective:     HPI Depression & Anxiety: Patient presents for a follow up. Last OV was on 02/17/2019. Patient's symptoms were not controlled. He was advised to add additional 75 mg to Venlafaxine to total 225 mg per day. He reports compliance with treatment plan. He states symptoms are worse. Has tried bupropion and escitalopram in the past which did not help. He did respond to venlafaxine when it was first started, but is no longer helping enough for him to get through his daily activities. Has been increasingly anxious which he feels is primary to depression. He has started having persistent nausea and vomiting triggered by anxiety attacks and has only been able to work a few days the last few weeks. We had prescribed amitriptyline for suspected IBS which he states helped for a few weeks, but doesn't seem to help at all any more. He has applied for leave absence from week until the end of November.   He was referred to Dr. Nicolasa Ducking several months ago but he did not follow up with referral due to long wait to get into see her.   Depression screen [facility] 2/9 04/16/2019 02/10/2018 12/24/2016  Decreased Interest 3 0 0  Down, Depressed, Hopeless 3 1 1   PHQ - 2 Score 6 1 1   Altered sleeping 3 0 0  Tired, decreased energy 3 1 0  Change in appetite 3 0 0  Feeling bad or failure about yourself  3 1 0  Trouble concentrating 2 0 0  Moving slowly or fidgety/restless 2 0 0  Suicidal thoughts 3 1 0  PHQ-9 Score 25 4 1   Difficult doing work/chores Extremely dIfficult Not difficult at all Not difficult at all   GAD 7 : Generalized Anxiety Score 04/16/2019  Nervous, Anxious, on Edge 3  Control/stop worrying 3  Worry too much - different things 3  Trouble relaxing  3  Restless 3  Easily annoyed or irritable 2  Afraid - awful might happen 3  Total GAD 7 Score 20  Anxiety Difficulty Extremely difficult     Patient would also like to discuss some ongoing GI issues including nausea and vomiting. He reports symptoms are on and off the past 2 years. Patient is also requesting continuous FMLA through November for symptoms.   Allergies  Allergen Reactions  . Bupropion     Extreme anxiety  . Morphine      Current Outpatient Medications:  .  amitriptyline (ELAVIL) 50 MG tablet, TAKE 1 TABLET BY MOUTH EVERYDAY AT BEDTIME, Disp: 30 tablet, Rfl: 5 .  Dulaglutide (TRULICITY) A999333 0000000 SOPN, Inject 0.75 mg into the skin once a week., Disp: 4 pen, Rfl: 2 .  FARXIGA 10 MG TABS tablet, TAKE 1 TABLET BY MOUTH DAILY, Disp: 30 tablet, Rfl: 6 .  glipiZIDE (GLUCOTROL XL) 10 MG 24 hr tablet, TAKE 1 TABLET BY MOUTH DAILY WITH BREAKFAST., Disp: 90 tablet, Rfl: 4 .  glucose blood (ONETOUCH VERIO) test strip, 1 strip daily. Reported on 10/20/2015, Disp: , Rfl:  .  ketoconazole (NIZORAL) 2 % cream, APPLY TO AFFECTED AREA ONCE DAILY FOR 7 DAYS AS NEEDED, Disp: 15 g, Rfl: 3 .  metFORMIN (GLUCOPHAGE-XR) 500 MG 24 hr tablet,  TAKE 2 TABLETS BY MOUTH TWICE A DAY, Disp: 120 tablet, Rfl: 3 .  omeprazole (PRILOSEC) 40 MG capsule, TAKE 1 CAPSULE (40 MG TOTAL) BY MOUTH DAILY., Disp: 30 capsule, Rfl: 11 .  ondansetron (ZOFRAN) 4 MG tablet, Take 1 tablet (4 mg total) by mouth every 8 (eight) hours as needed for nausea or vomiting., Disp: 20 tablet, Rfl: 0 .  oxyCODONE-acetaminophen (PERCOCET) 7.5-325 MG tablet, Take 1 tablet by mouth every 4 (four) hours as needed for severe pain., Disp: 60 tablet, Rfl: 0 .  rosuvastatin (CRESTOR) 10 MG tablet, TAKE 1 TABLET BY MOUTH EVERY DAY, Disp: 30 tablet, Rfl: 3 .  sildenafil (VIAGRA) 50 MG tablet, TAKE ONE OR TWO TABLETS AS NEEDED, NO MORE THAN 2 IN A DAY INSURANCE COVERS 6 TAB IN 25 DAYS, Disp: 6 tablet, Rfl: 5 .  venlafaxine XR (EFFEXOR-XR)  150 MG 24 hr capsule, TAKE 1 CAPSULE (150 MG TOTAL) BY MOUTH DAILY WITH BREAKFAST., Disp: 30 capsule, Rfl: 12 .  venlafaxine XR (EFFEXOR-XR) 75 MG 24 hr capsule, TAKE 1 CAPSULE (75 MG TOTAL) BY MOUTH DAILY WITH BREAKFAST. TAKE IN ADDITION TO 150MG  CAPSULE, Disp: 30 capsule, Rfl: 5 .  fluconazole (DIFLUCAN) 150 MG tablet, TAKE 1 TABLET BY MOUTH AS ONE DOSE (Patient not taking: Reported on 04/08/2019), Disp: 1 tablet, Rfl: 5  Review of Systems  Constitutional: Negative.   Respiratory: Negative.   Cardiovascular: Negative.   Gastrointestinal: Positive for nausea and vomiting.  Musculoskeletal: Negative.   Psychiatric/Behavioral: The patient is nervous/anxious.        Depression     Social History   Tobacco Use  . Smoking status: Former Smoker    Years: 5.00    Quit date: 06/17/1997    Years since quitting: 21.8  . Smokeless tobacco: Never Used  . Tobacco comment: started smoking at ahe 16, and quit at age 73  Substance Use Topics  . Alcohol use: Yes    Alcohol/week: 0.0 standard drinks    Comment: occasional alcohol use      Objective:   BP 132/86 (BP Location: Right Arm, Patient Position: Sitting, Cuff Size: Large)   Pulse (!) 103   Temp (!) 95.9 F (35.5 C) (Temporal)   Wt (!) 345 lb 3.2 oz (156.6 kg)   BMI 46.82 kg/m  Vitals:   04/16/19 1533  BP: 132/86  Pulse: (!) 103  Temp: (!) 95.9 F (35.5 C)  TempSrc: Temporal  Weight: (!) 345 lb 3.2 oz (156.6 kg)  Body mass index is 46.82 kg/m.   Physical Exam  General appearance: Severely obese male, cooperative and in no acute distress Head: Normocephalic, without obvious abnormality, atraumatic Respiratory: Respirations even and unlabored, normal respiratory rate Extremities: All extremities are intact.  Skin: Skin color, texture, turgor normal. No rashes seen  Psych: Appropriate mood and affect. Neurologic: Mental status: Alert, oriented to person, place, and time, thought content appropriate.  No results found for  any visits on 04/16/19.     Assessment & Plan    1. Depression, unspecified depression type Counseled that he really needs to follow up with psychiatry due to severity of symptoms even it takes a few moths to get in - Ambulatory referral to Psychiatry  He states a family member is taking lamictal which has worked very well and he would like to try this medication. Will reduce venafaxine dose and start- lamoTRIgine (LAMICTAL) 25 MG tablet; Take one tablet daily for 14 days, then increase to 2 tablets daily  Dispense: 60  tablet; Refill: 0  2. Anxiety   3. Panic attacks  - Ambulatory referral to Psychiatry  4. Type 2 diabetes mellitus without complication, without long-term current use of insulin (HCC)  - Hemoglobin A1c  5. Hyperlipidemia, mixed He is tolerating rosuvastatin well with no adverse effects.   - Lipid panel - Comprehensive metabolic panel  His symptoms current preclude him from doing required work activities and requires leave of absence at least through 05-17-2019   The entirety of the information documented in the History of Present Illness, Review of Systems and Physical Exam were personally obtained by me. Portions of this information were initially documented by Idelle Jo, CMA and reviewed by me for thoroughness and accuracy.     [name], MD  [facility]

## 2019-04-16 NOTE — Patient Instructions (Addendum)
.   Please review the attached list of medications and notify my office if there are any errors.   . Please bring all of your medications to every appointment so we can make sure that our medication list is the same as yours.   . Reduce Effexor (venlafaxine) to 150mg  daily  . Please go to the lab draw station in [address] on the second floor of [facility]  when you are fasting for 8 hours. Normal hours are 8:00am to 12:30pm and 1:30pm to 4:00pm Monday through Friday

## 2019-04-21 ENCOUNTER — Ambulatory Visit (INDEPENDENT_AMBULATORY_CARE_PROVIDER_SITE_OTHER): Payer: 59 | Admitting: Psychiatry

## 2019-04-21 ENCOUNTER — Telehealth: Payer: Self-pay

## 2019-04-21 ENCOUNTER — Telehealth: Payer: Self-pay | Admitting: Family Medicine

## 2019-04-21 ENCOUNTER — Encounter: Payer: Self-pay | Admitting: Psychiatry

## 2019-04-21 ENCOUNTER — Other Ambulatory Visit: Payer: Self-pay

## 2019-04-21 DIAGNOSIS — F331 Major depressive disorder, recurrent, moderate: Secondary | ICD-10-CM

## 2019-04-21 DIAGNOSIS — F4010 Social phobia, unspecified: Secondary | ICD-10-CM | POA: Insufficient documentation

## 2019-04-21 DIAGNOSIS — F411 Generalized anxiety disorder: Secondary | ICD-10-CM | POA: Diagnosis not present

## 2019-04-21 MED ORDER — VILAZODONE HCL 20 MG PO TABS: 20.0000 mg | ORAL_TABLET | Freq: Every day | ORAL | 1 refills | Status: DC

## 2019-04-21 MED ORDER — VENLAFAXINE HCL ER 75 MG PO CP24: 75.0000 mg | ORAL_CAPSULE | Freq: Every day | ORAL | 0 refills | Status: DC

## 2019-04-21 MED ORDER — VENLAFAXINE HCL ER 37.5 MG PO CP24: 37.5000 mg | ORAL_CAPSULE | Freq: Every day | ORAL | 1 refills | Status: DC
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
## 2019-04-21 NOTE — Telephone Encounter (Signed)
lab work orders mailed out  

## 2019-04-21 NOTE — Patient Instructions (Signed)
Vilazodone oral tablet What is this medicine? VILAZODONE (vil [place] oh done) is used to treat depression. This medicine may be used for other purposes; ask your health care provider or pharmacist if you have questions. COMMON BRAND NAME(S): VIIBRYD What should I tell my health care provider before I take this medicine? They need to know if you have any of these conditions:  bipolar disorder or a family history of bipolar disorder  glaucoma  liver disease  low levels of sodium in the blood  receiving electroconvulsive therapy  seizures (convulsions)  suicidal thoughts, plans, or attempt by you or a family member  an unusual or allergic reaction to vilazodone, other medicines, foods, dyes or preservatives  pregnant or trying to get pregnant  breast-feeding How should I use this medicine? Take this medicine by mouth with a glass of water. Follow the directions on the prescription label. Take this medicine with food. Take your medicine at regular intervals. Do not take your medicine more often than directed. Do not stop taking this medicine suddenly except upon the advice of your doctor. Stopping this medicine too quickly may cause serious side effects or your condition may worsen. A special MedGuide will be given to you by the pharmacist with each prescription and refill. Be sure to read this information carefully each time. Overdosage: If you think you have taken too much of this medicine contact a poison control center or emergency room at once. NOTE: This medicine is only for you. Do not share this medicine with others. What if I miss a dose? If you miss a dose, take it as soon as you can. If it is almost time for your next dose, take only that dose. Do not take double or extra doses. What may interact with this medicine? Do not take this medicine with any of the following medications:  linezolid  MAOIs like Carbex, Eldepryl, Marplan, Nardil, and Parnate  methylene blue  (injected into a vein) This medicine may also interact with the following medications:  amphetamines  aspirin and aspirin-like medicines  buspirone  certain diet drugs like dexfenfluramine, fenfluramine, phentermine, sibutramine  certain migraine headache medicines like almotriptan, eletriptan, frovatriptan, naratriptan, rizatriptan, sumatriptan, zolmitriptan  certain medicines that treat or prevent blood clots like warfarin, enoxaparin, and dalteparin  certain medicines that treat infections like clarithromycin, itraconazole, voriconazole, ketoconazole, rifampin  certain medicines that treat seizures like carbamazepine and phenytoin  digoxin  fentanyl  lithium  NSAIDS, medicines for pain and inflammation, like ibuprofen or naproxen  other medicines for depression, anxiety, or psychotic disturbances  St. John's Wort  tramadol  tryptophan This list may not describe all possible interactions. Give your health care provider a list of all the medicines, herbs, non-prescription drugs, or dietary supplements you use. Also tell them if you smoke, drink alcohol, or use illegal drugs. Some items may interact with your medicine. What should I watch for while using this medicine? Tell your doctor if your symptoms do not get better or if they get worse. Visit your doctor or health care professional for regular checks on your progress. Because it may take several weeks to see the full effects of this medicine, it is important to continue your treatment as prescribed by your doctor. Patients and their families should watch out for new or worsening thoughts of suicide or depression. Also watch out for sudden changes in feelings such as feeling anxious, agitated, panicky, irritable, hostile, aggressive, impulsive, severely restless, overly excited and hyperactive, or not being able to   sleep. If this happens, especially at the beginning of treatment or after a change in dose, call your health  care professional. You may get drowsy or dizzy. Do not drive, use machinery, or do anything that needs mental alertness until you know how this medicine affects you. Do not stand or sit up quickly, especially if you are an older patient. This reduces the risk of dizzy or fainting spells. Alcohol may interfere with the effect of this medicine. Avoid alcoholic drinks. Your mouth may get dry. Chewing sugarless gum or sucking hard candy, and drinking plenty of water may help. Contact your doctor if the problem does not go away or is severe. What side effects may I notice from receiving this medicine? Side effects that you should report to your doctor or health care professional as soon as possible:  allergic reactions like skin rash, itching or hives, swelling of the face, lips, or tongue  anxious  black, tarry stools  changes in vision  confusion  elevated mood, decreased need for sleep, racing thoughts, impulsive behavior  eye pain  fast, irregular heartbeat  feeling faint or lightheaded, falls  feeling agitated, angry, or irritable  hallucination, loss of contact with reality  loss of balance or coordination  loss of memory  restlessness, pacing, inability to keep still  seizures  stiff muscles  suicidal thoughts or other mood changes  trouble sleeping  unusual bleeding or bruising  unusually weak or tired  vomiting Side effects that usually do not require medical attention (report to your doctor or health care professional if they continue or are bothersome):  change in appetite or weight  change in sex drive or performance  diarrhea  drowsiness  dry mouth  increased sweating  nausea  tremors This list may not describe all possible side effects. Call your doctor for medical advice about side effects. You may report side effects to FDA at 1-800-FDA-1088. Where should I keep my medicine? Keep out of the reach of children. Store at room temperature  between 15 and 30 degrees C (59 to 86 degrees F). Throw away any unused medicine after the expiration date. NOTE: This sheet is a summary. It may not cover all possible information. If you have questions about this medicine, talk to your doctor, pharmacist, or health care provider.  2020 Elsevier/Gold Standard (2016-01-30 12:50:48)  

## 2019-04-21 NOTE — Telephone Encounter (Signed)
[employer] was supposed to have faxed medical release form for work last [employer]. 10-29.  Pt requesting a call back to let him know if Dr. Caryn Section received it.  Please call pt back at [phone].  Thanks, [employer]

## 2019-04-21 NOTE — Telephone Encounter (Signed)
Dr. Caryn Section or Helene Kelp, have either of you seen this form?

## 2019-04-22 ENCOUNTER — Other Ambulatory Visit: Payer: Self-pay | Admitting: Family Medicine

## 2019-04-22 DIAGNOSIS — G8929 Other chronic pain: Secondary | ICD-10-CM

## 2019-04-22 DIAGNOSIS — M545 Low back pain, unspecified: Secondary | ICD-10-CM

## 2019-04-22 NOTE — Telephone Encounter (Signed)
Pt needing a refill on:  oxyCODONE-acetaminophen (PERCOCET) 7.5-325 MG tablet  Please fill at:  [id number] [place], [place] (Phone) [phone] (Fax)   Thanks, [employer]

## 2019-04-23 MED ORDER — OXYCODONE-ACETAMINOPHEN 7.5-325 MG PO TABS: 1.0000 | ORAL_TABLET | ORAL | 0 refills | Status: DC | PRN

## 2019-04-26 NOTE — Telephone Encounter (Signed)
We didn't receive a form, just a request for medical records. Please fax note from 04-16-2019 to [employer] (paperwork is in my box)

## 2019-05-04 ENCOUNTER — Other Ambulatory Visit: Payer: Self-pay | Admitting: Family Medicine

## 2019-05-04 DIAGNOSIS — G8929 Other chronic pain: Secondary | ICD-10-CM

## 2019-05-04 DIAGNOSIS — M545 Low back pain, unspecified: Secondary | ICD-10-CM

## 2019-05-04 NOTE — Telephone Encounter (Signed)
Medication Refill - Medication:  Prednisone 10mg  oxyCODONE-acetaminophen (PERCOCET) 7.5-325 MG tablet  Has the patient contacted their pharmacy? No - states he has to call office for these (Agent: If no, request that the patient contact the pharmacy for the refill.) (Agent: If yes, when and what did the pharmacy advise?)  Preferred Pharmacy (with phone number or street name):  [id number] [place], [place] [phone] (Phone) [phone] (Fax)   Agent: Please be advised that RX refills may take up to 3 business days. We ask that you follow-up with your pharmacy.

## 2019-05-04 NOTE — Telephone Encounter (Signed)
From [facility], please review 

## 2019-05-05 ENCOUNTER — Other Ambulatory Visit: Payer: Self-pay

## 2019-05-05 ENCOUNTER — Ambulatory Visit (INDEPENDENT_AMBULATORY_CARE_PROVIDER_SITE_OTHER): Payer: 59 | Admitting: Psychiatry

## 2019-05-05 ENCOUNTER — Telehealth: Payer: Self-pay | Admitting: Psychiatry

## 2019-05-05 ENCOUNTER — Encounter: Payer: Self-pay | Admitting: Psychiatry

## 2019-05-05 DIAGNOSIS — F331 Major depressive disorder, recurrent, moderate: Secondary | ICD-10-CM | POA: Diagnosis not present

## 2019-05-05 DIAGNOSIS — F4010 Social phobia, unspecified: Secondary | ICD-10-CM | POA: Diagnosis not present

## 2019-05-05 DIAGNOSIS — F411 Generalized anxiety disorder: Secondary | ICD-10-CM

## 2019-05-05 MED ORDER — OXYCODONE-ACETAMINOPHEN 7.5-325 MG PO TABS: 1.0000 | ORAL_TABLET | ORAL | 0 refills | Status: DC | PRN

## 2019-05-05 NOTE — Progress Notes (Signed)
Virtual Visit via Video Note  I connected with Raymond Rose on 05/05/19 at  2:15 PM EST by a video enabled telemedicine application and verified that I am speaking with the correct person using two identifiers.   I discussed the limitations of evaluation and management by telemedicine and the availability of in person appointments. The patient expressed understanding and agreed to proceed.      I discussed the assessment and treatment plan with the patient. The patient was provided an opportunity to ask questions and all were answered. The patient agreed with the plan and demonstrated an understanding of the instructions.   The patient was advised to call back or seek an in-person evaluation if the symptoms worsen or if the condition fails to improve as anticipated.   [place] MD OP Progress Note  05/05/2019 5:05 PM Raymond Rose  MRN:  [id number]  Chief Complaint:  Chief Complaint    Follow-up     HPI: Raymond Rose is a 43 year old Caucasian male, employed, married, lives in [place], has a history of depression, generalized anxiety disorder, social anxiety disorder, diabetes mellitus, obstructive sleep apnea, erectile dysfunction, arthritis was evaluated by telemedicine today.  Patient reports he struggle with worsening depressive symptoms, anxiety, restlessness and sleep problems for few days after his medication changes.  He reports that he is currently taking venlafaxine 75 mg, he is planning to go down to 37.5 mg tomorrow.  He reports he is on a higher dosage of the Lamictal at this time.  He is currently taking a 50 mg.  He reports he is tolerating the medications well.  And the past couple of days his mood symptoms have been better.  He is compliant on the Viibryd as prescribed.  He denies any side effects.  He reports sleep is better the past few days.  Patient denies any suicidality, homicidality or perceptual disturbances.  Patient reports he has been unable to start  psychotherapy sessions however is going to find a therapist soon.  He is motivated to do so.  He denies any other concerns today. Visit Diagnosis:    ICD-10-CM   1. MDD (major depressive disorder), recurrent episode, moderate (HCC)  F33.1   2. GAD (generalized anxiety disorder)  F41.1   3. Social anxiety disorder  F40.10     Past Psychiatric History: Reviewed past psychiatric history from my progress note on 04/21/2019.  Past trials of Wellbutrin-side effects, Lexapro-initially worked but stopped working after that, Effexor  Past Medical History:  Past Medical History:  Diagnosis Date  . Anxiety   . Arthritis   . Depression   . Diabetes mellitus, type II (Raymond)   . OSA on CPAP   . Vertigo 01/03/2015    Past Surgical History:  Procedure Laterality Date  . 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 CHOLECYSTECTOMY  2010   lap Cholecystectomy  . 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 CT scan of Brain  12/23/2011   without contrast, [facility], Normal  . TONSILLECTOMY  1985    Family Psychiatric History: I have reviewed family psychiatric history from my progress note on 04/21/2019  Family History:  Family History  Problem Relation Age of Onset  . Depression Mother   . Heart attack Father 39  . Arthritis Other   . Alcohol abuse Other   . Lung cancer Other   . Hyperlipidemia Other   .  CAD Other   . Stroke Other   . Hypertension Other   . Bipolar disorder Other     Social History: I have reviewed social history from my progress note on 04/21/2019 Social History   Socioeconomic History  . Marital status: Married    Spouse name: Not on file  . Number of children: Not on file  . Years of education: Not on file  . Highest education level: Not on file  Occupational History  . Occupation: Banking    Comment: Geneticist, molecular  . Financial resource strain: Not on file  . Food  insecurity    Worry: Not on file    Inability: Not on file  . Transportation needs    Medical: Not on file    Non-medical: Not on file  Tobacco Use  . Smoking status: Former Smoker    Years: 5.00    Quit date: 06/17/1997    Years since quitting: 21.8  . Smokeless tobacco: Never Used  . Tobacco comment: started smoking at ahe 16, and quit at age 62  Substance and Sexual Activity  . Alcohol use: Yes    Alcohol/week: 0.0 standard drinks    Comment: occasional alcohol use  . Drug use: No  . Sexual activity: Not on file  Lifestyle  . Physical activity    Days per week: Not on file    Minutes per session: Not on file  . Stress: Not on file  Relationships  . Social Herbalist on phone: Not on file    Gets together: Not on file    Attends religious service: Not on file    Active member of club or organization: Not on file    Attends meetings of clubs or organizations: Not on file    Relationship status: Not on file  Other Topics Concern  . Not on file  Social History Narrative  . Not on file    Allergies:  Allergies  Allergen Reactions  . Bupropion     Extreme anxiety  . Morphine     Metabolic Disorder Labs: Lab Results  Component Value Date   HGBA1C 8.7 (A) 02/17/2019   No results found for: PROLACTIN Lab Results  Component Value Date   CHOL 188 10/30/2018   TRIG 534 (H) 10/30/2018   HDL 29 (L) 10/30/2018   CHOLHDL 6.5 (H) 10/30/2018   [place] Comment 10/30/2018   [place] Comment 06/08/2018   Lab Results  Component Value Date   TSH 1.570 01/24/2017   TSH 2.250 01/03/2015    Therapeutic Level Labs: No results found for: LITHIUM No results found for: VALPROATE No components found for:  CBMZ  Current Medications: Current Outpatient Medications  Medication Sig Dispense Refill  . amitriptyline (ELAVIL) 50 MG tablet TAKE 1 TABLET BY MOUTH EVERYDAY AT BEDTIME 30 tablet 5  . Dulaglutide (TRULICITY) [id number] 0000000 SOPN Inject 0.75 mg into the skin  once a week. 4 pen 2  . FARXIGA 10 MG TABS tablet TAKE 1 TABLET BY MOUTH DAILY 30 tablet 6  . fluconazole (DIFLUCAN) 150 MG tablet TAKE 1 TABLET BY MOUTH AS ONE DOSE 1 tablet 5  . glipiZIDE (GLUCOTROL XL) 10 MG 24 hr tablet TAKE 1 TABLET BY MOUTH DAILY WITH BREAKFAST. 90 tablet 4  . glucose blood (ONETOUCH VERIO) test strip 1 strip daily. Reported on 10/20/2015    . ketoconazole (NIZORAL) 2 % cream APPLY TO AFFECTED AREA ONCE DAILY FOR 7 DAYS AS NEEDED 15 g  3  . lamoTRIgine (LAMICTAL) 25 MG tablet Take one tablet daily for 14 days, then increase to 2 tablets daily 60 tablet 0  . metFORMIN (GLUCOPHAGE-XR) 500 MG 24 hr tablet TAKE 2 TABLETS BY MOUTH TWICE A DAY 120 tablet 3  . omeprazole (PRILOSEC) 40 MG capsule TAKE 1 CAPSULE (40 MG TOTAL) BY MOUTH DAILY. 30 capsule 11  . ondansetron (ZOFRAN) 4 MG tablet Take 1 tablet (4 mg total) by mouth every 8 (eight) hours as needed for nausea or vomiting. 20 tablet 0  . oxyCODONE-acetaminophen (PERCOCET) 7.5-325 MG tablet Take 1 tablet by mouth every 4 (four) hours as needed for severe pain. 60 tablet 0  . rosuvastatin (CRESTOR) 10 MG tablet TAKE 1 TABLET BY MOUTH EVERY DAY 30 tablet 3  . sildenafil (VIAGRA) 50 MG tablet TAKE ONE OR TWO TABLETS AS NEEDED, NO MORE THAN 2 IN A DAY INSURANCE COVERS 6 TAB IN 25 DAYS 6 tablet 5  . venlafaxine XR (EFFEXOR XR) 75 MG 24 hr capsule Take 1 capsule (75 mg total) by mouth daily with breakfast. For 7 days 7 capsule 0  . venlafaxine XR (EFFEXOR-XR) 37.5 MG 24 hr capsule Take 1 capsule (37.5 mg total) by mouth daily with breakfast. Start 37.5 mg in 7 days from now 15 capsule 1  . Vilazodone HCl 20 MG TABS Take 1 tablet (20 mg total) by mouth daily with breakfast. 30 tablet 1   No current facility-administered medications for this visit.      Musculoskeletal: Strength & Muscle Tone: UTA Gait & Station: normal Patient leans: N/A  Psychiatric Specialty Exam: Review of Systems  Psychiatric/Behavioral: The patient is  nervous/anxious.   All other systems reviewed and are negative.   There were no vitals taken for this visit.There is no height or weight on file to calculate BMI.  General Appearance: Casual  Eye Contact:  Fair  Speech:  Clear and Coherent  Volume:  Normal  Mood:  Anxious  Affect:  Congruent  Thought Process:  Goal Directed and Descriptions of Associations: Intact  Orientation:  Full (Time, Place, and Person)  Thought Content: Logical   Suicidal Thoughts:  No  Homicidal Thoughts:  No  Memory:  Immediate;   Fair Recent;   Fair Remote;   Fair  Judgement:  Fair  Insight:  Fair  Psychomotor Activity:  Normal  Concentration:  Concentration: Fair and Attention Span: Fair  Recall:  [employer] of Knowledge: Fair  Language: Fair  Akathisia:  No  Handed:  Right  AIMS (if indicated): Denies tremors, rigidity  Assets:  Communication Skills Desire for Improvement Housing Intimacy Social Support  ADL's:  Intact  Cognition: WNL  Sleep:  Fair   Screenings: GAD-7     Office Visit from 04/16/2019 in [place]  Total GAD-7 Score  20    PHQ2-9     Office Visit from 04/16/2019 in [place] Visit from 02/10/2018 in [place] Visit from 12/24/2016 in [place] Visit from 05/23/2016 in [place] Visit from 05/20/2016 in [place]  PHQ-2 Total Score  6  1  1   0  0  PHQ-9 Total Score  25  4  1   -  3       Assessment and Plan: Raymond Rose is a 42 year old Caucasian male, employed, lives in [place], has a history of depression, anxiety, obstructive sleep apnea on CPAP, diabetes mellitus, arthritis was evaluated by telemedicine today.  Patient  is biologically predisposed given his history of trauma, family history of mental health problems.  Patient is currently making progress and is tolerating the medications well.  Plan MDD-improving Continue to taper off Effexor-she  will go down to 37.5 mg daily. Continue Viibryd 20 mg p.o. daily Continue Lamictal 50 mg p.o. daily-initiated by primary care provider He is also on amitriptyline 50 mg for GI symptoms-initiated by primary care provider. Patient is aware about the drug to drug interaction including serotonin syndrome Patient was referred to Ms. Miguel Dibble for CBT-pending.  Patient also advised to call other therapist in the community to establish care.  GAD-improving Referred for CBT  Social anxiety disorder-unstable Referred for CBT  Pending labs-TSH  Follow-up in clinic in 3 to 4 weeks or sooner if needed.  December 14 at 10:40 AM  I have spent atleast 15 minutes non face to face with patient today. More than 50 % of the time was spent for psychoeducation and supportive psychotherapy and care coordination. This note was generated in part or whole with voice recognition software. Voice recognition is usually quite accurate but there are transcription errors that can and very often do occur. I apologize for any typographical errors that were not detected and corrected.       Ursula Alert, MD 05/05/2019, 5:05 PM

## 2019-05-06 ENCOUNTER — Other Ambulatory Visit: Payer: Self-pay | Admitting: Family Medicine

## 2019-05-06 NOTE — Telephone Encounter (Signed)
Requested medication (s) are due for refill today: yes  Requested medication (s) are on the active medication list: yes  Last refill:  03/21/2019  Future visit scheduled: no  Notes to clinic: one medication has been discontinued     Requested Prescriptions  Pending Prescriptions Disp Refills   predniSONE (DELTASONE) 10 MG tablet [Pharmacy Med Name: PREDNISONE 10 MG TABLET] 42 tablet 1    Sig: 6 tablets for 2 days, then 5 for 2 days, then 4 for 2 days, then 3 for 2 days, then 2 for 2 days, then 1 for 2 days.     Not Delegated - Endocrinology:  Oral Corticosteroids Failed - 05/06/2019 12:48 PM      Failed - This refill cannot be delegated      Passed - Last BP in normal range    BP Readings from Last 1 Encounters:  04/16/19 132/86         Passed - Valid encounter within last 6 months    Recent Outpatient Visits          2 weeks ago Depression, unspecified depression type   [facility] Birdie Sons, MD   4 weeks ago Nausea and vomiting, intractability of vomiting not specified, unspecified vomiting type   [place], Adriana M, PA-C   2 months ago Type 2 diabetes mellitus without complication, without long-term current use of insulin [facility])   [facility] Birdie Sons, MD   6 months ago Nausea and vomiting, intractability of vomiting not specified, unspecified vomiting type   [facility] Birdie Sons, MD   8 months ago Depression, unspecified depression type   [facility] Birdie Sons, MD

## 2019-05-17 ENCOUNTER — Other Ambulatory Visit: Payer: Self-pay | Admitting: Family Medicine

## 2019-05-17 DIAGNOSIS — G8929 Other chronic pain: Secondary | ICD-10-CM

## 2019-05-17 DIAGNOSIS — M545 Low back pain, unspecified: Secondary | ICD-10-CM

## 2019-05-17 NOTE — Telephone Encounter (Signed)
Medication Refill - Medication: oxyCODONE-acetaminophen (PERCOCET) 7.5-325 MG tablet    Has the patient contacted their pharmacy? No. (Agent: If no, request that the patient contact the pharmacy for the refill.) (Agent: If yes, when and what did the pharmacy advise?)  Preferred Pharmacy (with phone number or street name):  [id number] [place], [place] [phone] (Phone) [phone] (Fax)     Agent: Please be advised that RX refills may take up to 3 business days. We ask that you follow-up with your pharmacy.

## 2019-05-17 NOTE — Telephone Encounter (Signed)
From [facility] 

## 2019-05-17 NOTE — Telephone Encounter (Signed)
LOV 04/16/2019 and last RF 05/05/2019

## 2019-05-19 ENCOUNTER — Telehealth (HOSPITAL_COMMUNITY): Payer: Self-pay | Admitting: Psychiatry

## 2019-05-19 ENCOUNTER — Other Ambulatory Visit: Payer: Self-pay | Admitting: Family Medicine

## 2019-05-19 ENCOUNTER — Telehealth: Payer: Self-pay

## 2019-05-19 DIAGNOSIS — F331 Major depressive disorder, recurrent, moderate: Secondary | ICD-10-CM

## 2019-05-19 DIAGNOSIS — F4010 Social phobia, unspecified: Secondary | ICD-10-CM

## 2019-05-19 DIAGNOSIS — F329 Major depressive disorder, single episode, unspecified: Secondary | ICD-10-CM

## 2019-05-19 DIAGNOSIS — F32A Depression, unspecified: Secondary | ICD-10-CM

## 2019-05-19 DIAGNOSIS — F411 Generalized anxiety disorder: Secondary | ICD-10-CM

## 2019-05-19 MED ORDER — QUETIAPINE FUMARATE 25 MG PO TABS: 25.0000 mg | ORAL_TABLET | Freq: Every day | ORAL | 1 refills | Status: DC

## 2019-05-19 MED ORDER — OXYCODONE-ACETAMINOPHEN 7.5-325 MG PO TABS: 1.0000 | ORAL_TABLET | ORAL | 0 refills | Status: DC | PRN

## 2019-05-19 NOTE — Telephone Encounter (Signed)
Returned call to patient since he is not doing well emotionally.  Patient reports anxiety, racing thoughts, sleep problems.  Patient reports fleeting suicidal thoughts however reports he does not dwell on those thoughts.  He denies any plan at this time.  He reports he is talking to his family, his mother and his wife are very supportive and they are helping him out.  He is currently on a leave of absence from work.  Discussed referral for intensive outpatient program-patient is interested.  Patient does not sleeping.  He takes amitriptyline at bedtime for GI symptoms.  He however reports its not helpful with his GI issues also.  Discussed with him that a medication like Seroquel cannot be added along with the amitriptyline, he is also on Effexor currently being tapered off, Viibryd recently started as well as Lamictal.  These medications can have drug to drug interaction.  Patient reports he would like his amitriptyline to be discontinued.  Will add Seroquel 25 mg p.o. nightly for his mood as well as racing thoughts and sleep.  I will also forward this note to Dr. Caryn Section his primary care provider.  Amitriptyline was initiated by Dr. Caryn Section.  Crisis plan discussed with patient.

## 2019-05-19 NOTE — Telephone Encounter (Signed)
D:  Dr. Shea Evans referred pt to [place].  A:  Placed call to do [facility], orient pt and provide him with a start date of tomorrow per request of Dr. Shea Evans, but pt states he would like to contact his insurance co to verify his benefits since he's currently out on leave.  States he also, would like to discuss IOP with his wife.  Encouraged pt to contact case manager if he has any further questions or would like to start.  Inform Dr. Shea Evans.  R:  Pt receptive.

## 2019-05-19 NOTE — Telephone Encounter (Signed)
Please advise patient he is overdue for follow up of uncontrolled diabetes and needs to have labs done that were ordered in October. Need to schedule follow up before any refills can be approved.

## 2019-05-19 NOTE — Telephone Encounter (Signed)
Patient advised. Appointment has been scheduled for Monday 05/24/2019 at 10:40am. Patient says he is completely out of this medication and wants to know if Dr. Caryn Section or someone else will refill this since he scheduled an appointment?

## 2019-05-19 NOTE — Telephone Encounter (Signed)
Refilled by Dr. Caryn Section

## 2019-05-19 NOTE — Telephone Encounter (Signed)
pt states that he feels like the medication changes is not working he having highs and lows and he even having suicidal thought but has no plans on follow thru.  pt would like to discuss with you.

## 2019-05-19 NOTE — Telephone Encounter (Signed)
Pt is out of medication and would like to know if another dr will refill his oxyCODONE-acetaminophen (PERCOCET) 7.5-325 MG tablet Since Dr Caryn Section will not be back until Monday.

## 2019-05-24 ENCOUNTER — Ambulatory Visit: Payer: 59 | Admitting: Family Medicine

## 2019-05-24 ENCOUNTER — Encounter: Payer: Self-pay | Admitting: Family Medicine

## 2019-05-24 ENCOUNTER — Other Ambulatory Visit: Payer: Self-pay

## 2019-05-24 VITALS — BP 131/85 | HR 90 | Temp 96.9°F | Wt 349.2 lb

## 2019-05-24 DIAGNOSIS — E119 Type 2 diabetes mellitus without complications: Secondary | ICD-10-CM

## 2019-05-24 LAB — POCT GLYCOSYLATED HEMOGLOBIN (HGB A1C)
Est. average glucose Bld gHb Est-mCnc: 189
Hemoglobin A1C: 8.2 % — AB (ref 4.0–5.6)

## 2019-05-24 NOTE — Progress Notes (Addendum)
Patient: Raymond Rose Male    DOB: 12/01/1975   43 y.o.   MRN: [id number] Visit Date: 05/24/2019  Today's Provider: [name], MD   Chief Complaint  Patient presents with  . Diabetes   Subjective:     HPI  Diabetes Mellitus Type II, Follow-up:   Lab Results  Component Value Date   HGBA1C 8.7 (A) 02/17/2019   HGBA1C 8.9 (H) 10/30/2018   HGBA1C 9.3 (A) 06/08/2018    Last seen for diabetes 3 months ago.  Management since then includes start Trulicity 0.75MG /0.5ML. He reports good compliance with treatment. He is not having side effects.  Current symptoms include none and have been stable. Home blood sugar records: fasting range: Around 100's to 240's.  Episodes of hypoglycemia? no   Current insulin regiment: Trulicity Most Recent Eye Exam: due Weight trend: stable Prior visit with dietician: No Current exercise: none Current diet habits: well balanced  Pertinent Labs:    Component Value Date/Time   CHOL 188 10/30/2018 0915   TRIG 534 (H) 10/30/2018 0915   HDL 29 (L) 10/30/2018 0915   LDLCALC Comment 10/30/2018 0915   CREATININE 0.76 10/30/2018 0915   CREATININE 0.68 09/07/2014 1206   CREATININE 0.68 09/07/2014 1206    Wt Readings from Last 3 Encounters:  05/24/19 (!) 349 lb 3.2 oz (158.4 kg)  04/16/19 (!) 345 lb 3.2 oz (156.6 kg)  02/17/19 (!) 359 lb (162.8 kg)    ------------------------------------------------------------------------  Depression: He continues to have severe depression and anxiety impairing ability to function and not been able to work. Is now managed by psychiatry Dr. Shea Evans and has tapered off Effexor and now on Viibryd and Lamictal. Has also stopped amitriptyline which he has been on for IBS and is now having more frequent bowel movement which is also interfering with ability to work. Has also been referred for cognitive behavioral therapy. Is scheduled for follow up with psychiatry 05-31-2019  Allergies  Allergen  Reactions  . Bupropion     Extreme anxiety  . Morphine      Current Outpatient Medications:  .  Dulaglutide (TRULICITY) A999333 [id number] SOPN, Inject 0.75 mg into the skin once a week., Disp: 4 pen, Rfl: 2 .  FARXIGA 10 MG TABS tablet, TAKE 1 TABLET BY MOUTH DAILY, Disp: 30 tablet, Rfl: 6 .  fluconazole (DIFLUCAN) 150 MG tablet, TAKE 1 TABLET BY MOUTH AS ONE DOSE, Disp: 1 tablet, Rfl: 5 .  glipiZIDE (GLUCOTROL XL) 10 MG 24 hr tablet, TAKE 1 TABLET BY MOUTH DAILY WITH BREAKFAST., Disp: 90 tablet, Rfl: 4 .  glucose blood (ONETOUCH VERIO) test strip, 1 strip daily. Reported on 10/20/2015, Disp: , Rfl:  .  ketoconazole (NIZORAL) 2 % cream, APPLY TO AFFECTED AREA ONCE DAILY FOR 7 DAYS AS NEEDED, Disp: 15 g, Rfl: 3 .  lamoTRIgine (LAMICTAL) 25 MG tablet, TAKE ONE TABLET DAILY FOR 14 DAYS, THEN INCREASE TO 2 TABLETS DAILY, Disp: 60 tablet, Rfl: 5 .  metFORMIN (GLUCOPHAGE-XR) 500 MG 24 hr tablet, TAKE 2 TABLETS BY MOUTH TWICE A DAY, Disp: 120 tablet, Rfl: 3 .  omeprazole (PRILOSEC) 40 MG capsule, TAKE 1 CAPSULE (40 MG TOTAL) BY MOUTH DAILY., Disp: 30 capsule, Rfl: 11 .  ondansetron (ZOFRAN) 4 MG tablet, Take 1 tablet (4 mg total) by mouth every 8 (eight) hours as needed for nausea or vomiting., Disp: 20 tablet, Rfl: 0 .  oxyCODONE-acetaminophen (PERCOCET) 7.5-325 MG tablet, Take 1 tablet by mouth every 4 (four) hours  as needed for severe pain., Disp: 60 tablet, Rfl: 0 .  predniSONE (DELTASONE) 10 MG tablet, 6 TABLETS FOR 2 DAYS, THEN 5 FOR 2 DAYS, THEN 4 FOR 2 DAYS, THEN 3 FOR 2 DAYS, THEN 2 FOR 2 DAYS, THEN 1 FOR 2 DAYS., Disp: 42 tablet, Rfl: 1 .  QUEtiapine (SEROQUEL) 25 MG tablet, Take 1 tablet (25 mg total) by mouth at bedtime., Disp: 15 tablet, Rfl: 1 .  rosuvastatin (CRESTOR) 10 MG tablet, TAKE 1 TABLET BY MOUTH EVERY DAY, Disp: 30 tablet, Rfl: 3 .  sildenafil (VIAGRA) 50 MG tablet, TAKE ONE OR TWO TABLETS AS NEEDED, NO MORE THAN 2 IN A DAY INSURANCE COVERS 6 TAB IN 25 DAYS, Disp: 6 tablet, Rfl: 5 .   Vilazodone HCl 20 MG TABS, Take 1 tablet (20 mg total) by mouth daily with breakfast., Disp: 30 tablet, Rfl: 1 .  venlafaxine XR (EFFEXOR-XR) 37.5 MG 24 hr capsule, Take 1 capsule (37.5 mg total) by mouth daily with breakfast. Start 37.5 mg in 7 days from now (Patient not taking: Reported on 05/24/2019), Disp: 15 capsule, Rfl: 1  Review of Systems  Constitutional: Negative.   Respiratory: Negative.   Genitourinary: Negative.   Neurological: Negative.     Social History   Tobacco Use  . Smoking status: Former Smoker    Years: 5.00    Quit date: 06/17/1997    Years since quitting: 21.9  . Smokeless tobacco: Never Used  . Tobacco comment: started smoking at ahe 16, and quit at age 25  Substance Use Topics  . Alcohol use: Yes    Alcohol/week: 0.0 standard drinks    Comment: occasional alcohol use      Objective:   BP 131/85 (BP Location: Left Arm, Patient Position: Sitting, Cuff Size: Large)   Pulse 90   Temp (!) 96.9 F (36.1 C) (Temporal)   Wt (!) 349 lb 3.2 oz (158.4 kg)   SpO2 96%   BMI 47.36 kg/m  Vitals:   05/24/19 1052  BP: 131/85  Pulse: 90  Temp: (!) 96.9 F (36.1 C)  TempSrc: Temporal  SpO2: 96%  Weight: (!) 349 lb 3.2 oz (158.4 kg)  Body mass index is 47.36 kg/m.   Physical Exam  General appearance: Severely obese male, cooperative and in no acute distress Head: Normocephalic, without obvious abnormality, atraumatic Respiratory: Respirations even and unlabored, normal respiratory rate Extremities: All extremities are intact.  Skin: Skin color, texture, turgor normal. No rashes seen  Psych: Appropriate mood and affect. Neurologic: Mental status: Alert, oriented to person, place, and time, thought content appropriate.  Results for orders placed or performed in visit on 05/24/19  POCT HgB A1C  Result Value Ref Range   Hemoglobin A1C 8.2 (A) 4.0 - 5.6 %   HbA1c POC (<> result, manual entry)     HbA1c, POC (prediabetic range)     HbA1c, POC (controlled  diabetic range)     Est. average glucose Bld gHb Est-mCnc 189        Assessment & Plan    1. Type 2 diabetes mellitus without complication, without long-term current use of insulin ([place]) Doing well with Trulicity. He does have chronic nausea and vomiting likely related to anxiety and depression. He is seeing Dr. Shea Evans for this.  Will continue current medications for now. Consider increasing Trulicity if his chronic GI symptoms improve.  Follow up 3-4 months.   Sent to lab today, order placed at last o.v.   Continues to be severely depressed  and anxious precluding him from performing his essential work duties. Also complicated by having to stop amitriptyline due to potential interaction with new medications, resulting in worsening of IBS symptoms. Continues medication adjustments by psychiatry which will take several weeks to stabilize. I anticipate he will not be able to work through the end of January and plan on return to work 07/19/2019.   The entirety of the information documented in the History of Present Illness, Review of Systems and Physical Exam were personally obtained by me. Portions of this information were initially documented by [facility] CMA,and reviewed by me for thoroughness and accuracy.     [name], MD  [facility]

## 2019-05-25 ENCOUNTER — Telehealth: Payer: Self-pay

## 2019-05-25 LAB — HEMOGLOBIN A1C
Est. average glucose Bld gHb Est-mCnc: 203 mg/dL
Hgb A1c MFr Bld: 8.7 % — ABNORMAL HIGH (ref 4.8–5.6)

## 2019-05-25 LAB — COMPREHENSIVE METABOLIC PANEL
ALT: 22 IU/L (ref 0–44)
AST: 20 IU/L (ref 0–40)
Albumin/Globulin Ratio: 1.6 (ref 1.2–2.2)
Albumin: 4.5 g/dL (ref 4.0–5.0)
Alkaline Phosphatase: 125 IU/L — ABNORMAL HIGH (ref 39–117)
BUN/Creatinine Ratio: 17 (ref 9–20)
BUN: 12 mg/dL (ref 6–24)
Bilirubin Total: 0.4 mg/dL (ref 0.0–1.2)
CO2: 19 mmol/L — ABNORMAL LOW (ref 20–29)
Calcium: 9.6 mg/dL (ref 8.7–10.2)
Chloride: 100 mmol/L (ref 96–106)
Creatinine, Ser: 0.7 mg/dL — ABNORMAL LOW (ref 0.76–1.27)
GFR calc Af Amer: 136 mL/min/{1.73_m2} (ref >59)
GFR calc non Af Amer: 118 mL/min/{1.73_m2} (ref >59)
Globulin, Total: 2.9 g/dL (ref 1.5–4.5)
Glucose: 305 mg/dL — ABNORMAL HIGH (ref 65–99)
Potassium: 4.6 mmol/L (ref 3.5–5.2)
Sodium: 140 mmol/L (ref 134–144)
Total Protein: 7.4 g/dL (ref 6.0–8.5)

## 2019-05-25 LAB — LIPID PANEL
Chol/HDL Ratio: 5.8 ratio — ABNORMAL HIGH (ref 0.0–5.0)
Cholesterol, Total: 173 mg/dL (ref 100–199)
HDL: 30 mg/dL — ABNORMAL LOW (ref >39)
LDL Chol Calc (NIH): 42 mg/dL (ref 0–99)
Triglycerides: 718 mg/dL (ref 0–149)
VLDL Cholesterol Cal: 101 mg/dL — ABNORMAL HIGH (ref 5–40)

## 2019-05-25 LAB — TSH: TSH: 1.36 u[IU]/mL (ref 0.450–4.500)

## 2019-05-25 NOTE — Telephone Encounter (Signed)
-----   Message from Birdie Sons, MD sent at 05/25/2019  8:03 AM EST ----- Triglycerides are very high, need to increase rosuvastatin to 20mg  once a day, #30, rf x 5.  Follow up in march as scheduled.

## 2019-05-25 NOTE — Telephone Encounter (Signed)
LMTCB, okay for PEC to advise patient.  

## 2019-05-27 ENCOUNTER — Telehealth: Payer: Self-pay

## 2019-05-27 NOTE — Telephone Encounter (Signed)
Copied from [place] [id number]. Topic: General - Inquiry >> May 27, 2019  3:51 PM Raymond Rose wrote: Reason for CRM: pt called in and state that [facility] faxed over  paperwork to extend his leave.  He would like to know if this has been rec'd  Best number [phone]

## 2019-05-31 ENCOUNTER — Encounter: Payer: Self-pay | Admitting: Psychiatry

## 2019-05-31 ENCOUNTER — Other Ambulatory Visit: Payer: Self-pay

## 2019-05-31 ENCOUNTER — Ambulatory Visit (INDEPENDENT_AMBULATORY_CARE_PROVIDER_SITE_OTHER): Payer: 59 | Admitting: Psychiatry

## 2019-05-31 DIAGNOSIS — F4010 Social phobia, unspecified: Secondary | ICD-10-CM

## 2019-05-31 DIAGNOSIS — F331 Major depressive disorder, recurrent, moderate: Secondary | ICD-10-CM | POA: Diagnosis not present

## 2019-05-31 DIAGNOSIS — F411 Generalized anxiety disorder: Secondary | ICD-10-CM

## 2019-05-31 MED ORDER — QUETIAPINE FUMARATE 50 MG PO TABS: 50.0000 mg | ORAL_TABLET | Freq: Every day | ORAL | 1 refills | Status: DC

## 2019-05-31 MED ORDER — VILAZODONE HCL 40 MG PO TABS: 40.0000 mg | ORAL_TABLET | Freq: Every day | ORAL | 1 refills | Status: DC

## 2019-05-31 MED ORDER — ROSUVASTATIN CALCIUM 20 MG PO TABS: 20.0000 mg | ORAL_TABLET | Freq: Every day | ORAL | 5 refills | Status: DC

## 2019-05-31 NOTE — Telephone Encounter (Signed)
Patient states that he anticipates till the end of January when his FMLA will run out he states. KW

## 2019-05-31 NOTE — Telephone Encounter (Signed)
Patient has been advised and compliant with starting medication. KW

## 2019-05-31 NOTE — Progress Notes (Signed)
This note is not being shared with the patient for the following reason: To prevent harm (release of this note would result in harm to the life or physical safety of the patient or another).  Patient is suicidal   Virtual Visit via Video Note  I connected with Raymond Rose on 05/31/19 at 10:40 AM EST by a video enabled telemedicine application and verified that I am speaking with the correct person using two identifiers.   I discussed the limitations of evaluation and management by telemedicine and the availability of in person appointments. The patient expressed understanding and agreed to proceed.     I discussed the assessment and treatment plan with the patient. The patient was provided an opportunity to ask questions and all were answered. The patient agreed with the plan and demonstrated an understanding of the instructions.   The patient was advised to call back or seek an in-person evaluation if the symptoms worsen or if the condition fails to improve as anticipated.   Log Cabin MD OP Progress Note  05/31/2019 12:05 PM Raymond Rose  MRN:  [id number]  Chief Complaint:  Chief Complaint    Follow-up     HPI: Raymond Rose is a 43 year old Caucasian male, employed, married, lives in [place], has a history of depression, GAD, social anxiety disorder, diabetes melitis, obstructive sleep apnea, erectile dysfunction, arthritis was evaluated by telemedicine today.  Patient today reports he continues to struggle with depressive symptoms.  He also struggles with nervousness and worrying about different things.  He reports sadness, crying spells and lack of concentration.  He reports he continues to take venlafaxine 37.5 mg.  He is tolerating the Viibryd well and has not noticed any side effects from it at this time.  Patient reports however that he struggles with self-injurious thoughts.  He reports he struggles with chronic suicidality, it is always at the back of his mind.  He reports there  are times when his suicidal thoughts return  however he denies any active plans.  He denies ever having any plans.  He reports he is able to distract himself and focus on something else.  He is also able to talk to his wife-Raymond when he has these bad thoughts.  Patient reports he is planning to go to [facility] to stay with his mother for a few days to distract himself.  Patient reports he is waiting to hear back from his work about extension of leave of absence before he can start the intensive outpatient program.  Patient was referred for individual psychotherapy sessions however reports that his first appointment is early in January.  He however reports he will call his therapist to see if there are any openings before that.  Crisis plan discussed with patient.  He agrees to go to the nearest emergency department or call 911 if his suicidal thoughts worsen.  Writer called Ms. Raymond Rose at [phone] with patient's permission.  Discussed with his wife about patient's current depressive symptoms as well as suicidality.  Wife also agrees that she will call 911 or take him to the nearest emergency department if he has any worsening suicidal thoughts.  She is trying to get him to go and stay with his mother so that he can have support during the day when she is away at work. Visit Diagnosis:    ICD-10-CM   1. MDD (major depressive disorder), recurrent episode, moderate (HCC)  F33.1 Vilazodone HCl (VIIBRYD) 40 MG TABS    QUEtiapine (SEROQUEL) 50  MG tablet  2. GAD (generalized anxiety disorder)  F41.1 QUEtiapine (SEROQUEL) 50 MG tablet  3. Social anxiety disorder  F40.10     Past Psychiatric History: Reviewed past psychiatric history from my progress note on 04/21/2019.  Past trials of Wellbutrin-side effects, Lexapro-initially worked but stopped working after that, Effexor  Past Medical History:  Past Medical History:  Diagnosis Date  . Anxiety   . Arthritis   . Depression   . Diabetes  mellitus, type II ([place])   . OSA on CPAP   . Vertigo 01/03/2015    Past Surgical History:  Procedure Laterality Date  . 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 CHOLECYSTECTOMY  2010   lap Cholecystectomy  . 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 CT scan of Brain  12/23/2011   without contrast, [facility], Normal  . TONSILLECTOMY  1985    Family Psychiatric History: Reviewed past psychiatric history from my progress note on 04/21/2019  Family History:  Family History  Problem Relation Age of Onset  . Depression Mother   . Heart attack Father 1  . Arthritis Other   . Alcohol abuse Other   . Lung cancer Other   . Hyperlipidemia Other   . CAD Other   . Stroke Other   . Hypertension Other   . Bipolar disorder Other     Social History: Reviewed social history from my progress note on 04/21/2019 Social History   Socioeconomic History  . Marital status: Married    Spouse name: Not on file  . Number of children: Not on file  . Years of education: Not on file  . Highest education level: Not on file  Occupational History  . Occupation: Banking    Comment: Engineer, manufacturing  Tobacco Use  . Smoking status: Former Smoker    Years: 5.00    Quit date: 06/17/1997    Years since quitting: 21.9  . Smokeless tobacco: Never Used  . Tobacco comment: started smoking at ahe 16, and quit at age 89  Substance and Sexual Activity  . Alcohol use: Yes    Alcohol/week: 0.0 standard drinks    Comment: occasional alcohol use  . Drug use: No  . Sexual activity: Not on file  Other Topics Concern  . Not on file  Social History Narrative  . Not on file   Social Determinants of Health   Financial Resource Strain:   . Difficulty of Paying Living Expenses: Not on file  Food Insecurity:   . Worried About Charity fundraiser in the Last Year: Not on file  . Ran Out of Food in the Last Year: Not on file  Transportation  Needs:   . Lack of Transportation (Medical): Not on file  . Lack of Transportation (Non-Medical): Not on file  Physical Activity:   . Days of Exercise per Week: Not on file  . Minutes of Exercise per Session: Not on file  Stress:   . Feeling of Stress : Not on file  Social Connections:   . Frequency of Communication with Friends and Family: Not on file  . Frequency of Social Gatherings with Friends and Family: Not on file  . Attends Religious Services: Not on file  . Active Member of Clubs or Organizations: Not on file  . Attends Archivist Meetings: Not on file  . Marital Status: Not on file  Allergies:  Allergies  Allergen Reactions  . Bupropion     Extreme anxiety  . Morphine     Metabolic Disorder Labs: Lab Results  Component Value Date   HGBA1C 8.7 (H) 05/24/2019   No results found for: PROLACTIN Lab Results  Component Value Date   CHOL 173 05/24/2019   TRIG 718 (HH) 05/24/2019   HDL 30 (L) 05/24/2019   CHOLHDL 5.8 (H) 05/24/2019   LDLCALC 42 05/24/2019   [place] Comment 10/30/2018   Lab Results  Component Value Date   TSH 1.360 05/24/2019   TSH 1.570 01/24/2017    Therapeutic Level Labs: No results found for: LITHIUM No results found for: VALPROATE No components found for:  CBMZ  Current Medications: Current Outpatient Medications  Medication Sig Dispense Refill  . Dulaglutide (TRULICITY) A999333 0000000 SOPN Inject 0.75 mg into the skin once a week. 4 pen 2  . FARXIGA 10 MG TABS tablet TAKE 1 TABLET BY MOUTH DAILY 30 tablet 6  . fluconazole (DIFLUCAN) 150 MG tablet TAKE 1 TABLET BY MOUTH AS ONE DOSE 1 tablet 5  . glipiZIDE (GLUCOTROL XL) 10 MG 24 hr tablet TAKE 1 TABLET BY MOUTH DAILY WITH BREAKFAST. 90 tablet 4  . glucose blood (ONETOUCH VERIO) test strip 1 strip daily. Reported on 10/20/2015    . ketoconazole (NIZORAL) 2 % cream APPLY TO AFFECTED AREA ONCE DAILY FOR 7 DAYS AS NEEDED 15 g 3  . lamoTRIgine (LAMICTAL) 25 MG tablet TAKE ONE  TABLET DAILY FOR 14 DAYS, THEN INCREASE TO 2 TABLETS DAILY 60 tablet 5  . metFORMIN (GLUCOPHAGE-XR) 500 MG 24 hr tablet TAKE 2 TABLETS BY MOUTH TWICE A DAY 120 tablet 3  . omeprazole (PRILOSEC) 40 MG capsule TAKE 1 CAPSULE (40 MG TOTAL) BY MOUTH DAILY. 30 capsule 11  . ondansetron (ZOFRAN) 4 MG tablet Take 1 tablet (4 mg total) by mouth every 8 (eight) hours as needed for nausea or vomiting. 20 tablet 0  . oxyCODONE-acetaminophen (PERCOCET) 7.5-325 MG tablet Take 1 tablet by mouth every 4 (four) hours as needed for severe pain. 60 tablet 0  . predniSONE (DELTASONE) 10 MG tablet 6 TABLETS FOR 2 DAYS, THEN 5 FOR 2 DAYS, THEN 4 FOR 2 DAYS, THEN 3 FOR 2 DAYS, THEN 2 FOR 2 DAYS, THEN 1 FOR 2 DAYS. 42 tablet 1  . QUEtiapine (SEROQUEL) 50 MG tablet Take 1 tablet (50 mg total) by mouth at bedtime. 30 tablet 1  . rosuvastatin (CRESTOR) 10 MG tablet TAKE 1 TABLET BY MOUTH EVERY DAY 30 tablet 3  . sildenafil (VIAGRA) 50 MG tablet TAKE ONE OR TWO TABLETS AS NEEDED, NO MORE THAN 2 IN A DAY INSURANCE COVERS 6 TAB IN 25 DAYS 6 tablet 5  . Vilazodone HCl (VIIBRYD) 40 MG TABS Take 1 tablet (40 mg total) by mouth daily. 30 tablet 1   No current facility-administered medications for this visit.     Musculoskeletal: Strength & Muscle Tone: UTA Gait & Station: normal Patient leans: N/A  Psychiatric Specialty Exam: Review of Systems  Psychiatric/Behavioral: Positive for dysphoric mood and suicidal ideas. The patient is nervous/anxious.   All other systems reviewed and are negative.   There were no vitals taken for this visit.There is no height or weight on file to calculate BMI.  General Appearance: Casual  Eye Contact:  Fair  Speech:  Normal Rate  Volume:  Normal  Mood:  Depressed  Affect:  Congruent  Thought Process:  Goal Directed and Descriptions of Associations: Intact  Orientation:  Full (Time, Place, and Person)  Thought Content: Logical   Suicidal Thoughts:  Yes.  without intent/planchronic SI ,  denies active plans - reports he will not act on it  Homicidal Thoughts:  No  Memory:  Immediate;   Fair Recent;   Fair Remote;   Fair  Judgement:  Fair  Insight:  Fair  Psychomotor Activity:  Normal  Concentration:  Concentration: Fair and Attention Span: Fair  Recall:  [employer] of Knowledge: Fair  Language: Fair  Akathisia:  No  Handed:  Right  AIMS (if indicated): Denies tremors, rigidity  Assets:  Communication Skills Desire for Raymond Rose Transportation  ADL's:  Intact  Cognition: WNL  Sleep:  Fair   Screenings: GAD-7     Office Visit from 04/16/2019 in [place]  Total GAD-7 Score  20    PHQ2-9     Office Visit from 04/16/2019 in [facility] Visit from 02/10/2018 in [place] Visit from 12/24/2016 in [place] Visit from 05/23/2016 in [place] Visit from 05/20/2016 in [place]  PHQ-2 Total Score  6  1  1   0  0  PHQ-9 Total Score  25  4  1   --  3       Assessment and Plan: Raymond Rose is a 43 year old Caucasian male, employed, lives in [place], has a history of depression, anxiety, obstructive sleep apnea on CPAP, diabetes melitis, arthritis was evaluated by telemedicine today.  Patient is biologically predisposed given his history of trauma, family history of mental health problems.  Patient continues to struggle with depressive symptoms as well as suicidality. Acute risk for suicide-recent suicidal thoughts, mental health problems, currently not stable on medications, family history of mental health problems, patient is noncompliant with therapy as well as intensive outpatient program referral, gender and race. The protective factors are patient does report chronic suicidality however has never acted on it and continues to report that he will not act on it and denies any active plans, has good social  support system from his wife who agrees to send patient to live with his mother for a few days so that he has support during the day, patient agrees to start intensive outpatient program as well as individual psychotherapy as soon as possible, denies substance abuse problems, denies previous suicide attempts, denies family history of suicide attempts. Acute risk for suicide hence is low.  Plan MDD-some progress Increase Viibryd to 40 mg p.o. daily Continue to taper off Effexor.  Patient advised to stop taking it in a week. Continue Lamictal 50 mg p.o. daily for now. Increase Seroquel to 50 mg p.o. nightly. Patient referred for IOP as well as individual psychotherapy sessions.  Patient reports he will start individual therapy as soon as possible.  He is waiting for his FMLA application which is pending to see if he can start IOP.   GAD-improving Refer for CBT  Social anxiety disorder-unstable Refer for CBT  Pending labs-TSH   I have discussed patient's treatment plan as well as suicidality with his wife-Raymond as summarized above.  Wife agrees to take patient to the nearest emergency department.  She is also trying to let him stay with his mother for a few days so he has support during the day.  Crisis plan discussed with wife as well as patient.  Follow-up in clinic in 2 weeks or sooner if needed.  December 28 at  9:40 AM  I have spent atleast 25 minutes non- face to face with patient today. More than 50 % of the time was spent for psychoeducation and supportive psychotherapy and care coordination. This note was generated in part or whole with voice recognition software. Voice recognition is usually quite accurate but there are transcription errors that can and very often do occur. I apologize for any typographical errors that were not detected and corrected.      Ursula Alert, MD 05/31/2019, 12:05 PM

## 2019-05-31 NOTE — Telephone Encounter (Signed)
Yes, just need to know how long of a leave of absence he anticipates

## 2019-06-01 NOTE — Telephone Encounter (Signed)
Letter with note to send most recent office note sent to Medical Records.

## 2019-06-02 ENCOUNTER — Other Ambulatory Visit: Payer: Self-pay | Admitting: Family Medicine

## 2019-06-02 DIAGNOSIS — G8929 Other chronic pain: Secondary | ICD-10-CM

## 2019-06-02 DIAGNOSIS — M545 Low back pain, unspecified: Secondary | ICD-10-CM

## 2019-06-02 MED ORDER — OXYCODONE-ACETAMINOPHEN 7.5-325 MG PO TABS: 1.0000 | ORAL_TABLET | ORAL | 0 refills | Status: DC | PRN

## 2019-06-07 ENCOUNTER — Ambulatory Visit: Payer: 59 | Admitting: Psychiatry

## 2019-06-14 ENCOUNTER — Ambulatory Visit: Payer: 59 | Admitting: Psychiatry

## 2019-06-14 ENCOUNTER — Other Ambulatory Visit: Payer: Self-pay

## 2019-06-16 ENCOUNTER — Other Ambulatory Visit: Payer: Self-pay | Admitting: Family Medicine

## 2019-06-16 ENCOUNTER — Telehealth: Payer: Self-pay | Admitting: Family Medicine

## 2019-06-16 DIAGNOSIS — M545 Low back pain, unspecified: Secondary | ICD-10-CM

## 2019-06-16 DIAGNOSIS — G8929 Other chronic pain: Secondary | ICD-10-CM

## 2019-06-16 MED ORDER — OXYCODONE-ACETAMINOPHEN 7.5-325 MG PO TABS: 1.0000 | ORAL_TABLET | ORAL | 0 refills | Status: DC | PRN

## 2019-06-16 NOTE — Telephone Encounter (Signed)
More Detail >>  FW: Appointment Request  [name]  Sent: Wed June 16, 2019 11:04 AM  To: Birdie Sons, MD   Raymond Rose  MRN: [id number] DOB: 06-10-1976  Pt Work: [phone] Pt Home: [phone]  Entered: [phone]      Message  There is no where for a followup visit. Can you tell me where to put him?  ----- Message -----  From: Raymond Rose  Sent: 06/16/2019  9:35 AM EST  To: [facility] Admin  Subject: Appointment Request                 Appointment Request From: Raymond Rose    With Provider: [name], MD [[facility]]    Preferred Date Range: 06/21/2019 - 06/23/2019    Preferred Times: Any Time    Reason for visit: Office Visit    Comments:  [facility] follow up    Appointment Request  [name]  You 58 minutes ago (11:05 AM)  CE There is no where for a followup visit. Can you tell me where to put him?   Message text   [name], [name]  Patient Appointment Schedule Request Pool 2 hours ago (9:35 AM)   Appointment Request From: Raymond Rose  With Provider: [name], MD [[facility]]  Preferred Date Range: 06/21/2019 - 06/23/2019  Preferred Times: Any Time  Reason for visit: Office Visit  Comments: [facility] follow up

## 2019-06-16 NOTE — Telephone Encounter (Signed)
I don't have any appointments available those dates. I don't know what he is requesting an appointment for. If it is something acute then he should be scheduled with a PA. My next available follow up appointment slot in January 26th.

## 2019-06-16 NOTE — Telephone Encounter (Signed)
Attempted to contact patient, no answer and voicemail is not set up. Okay for PEC to schedule acute visit with a PA or follow up with Dr. Caryn Section on 07/13/2019

## 2019-06-18 DIAGNOSIS — U071 COVID-19: Secondary | ICD-10-CM

## 2019-06-18 HISTORY — DX: COVID-19: U07.1

## 2019-06-21 NOTE — Telephone Encounter (Signed)
Virtual Appointment scheduled 06/21/2018 at 9am for chronic vomiting.

## 2019-06-21 NOTE — Progress Notes (Signed)
cycli      Patient: Raymond Rose Male    DOB: 1976/04/12   44 y.o.   MRN: [id number] Visit Date: 06/22/2019  Today's Provider: [name], MD   Chief Complaint  Patient presents with  . Follow-up   Subjective:    Virtual Visit via Video Note  I connected with Raymond Rose on 06/22/19 at  9:00 AM EST by a video enabled telemedicine application and verified that I am speaking with the correct person using two identifiers.  Location: Patient: home Provider: [facility]   I discussed the limitations of evaluation and management by telemedicine and the availability of in person appointments. The patient expressed understanding and agreed to proceed.    HPI  Follow up for Chronic Vomiting syndrome:  Patient would like a referral to a new gastroenterologist.  This problem has occurred intermittently for the past 2 years.  He feels that condition is Worse.  He had been on amitriptyline 50mg  hs for extended period of time which had helped somewhat, but was still having nausea every morning and vomiting a few days a week. However he has had severe depression the last few months and his psychiatrist started him on Viibryd and took him off amitriptyline due to potential drug interactions. He states nausea and vomiting have been significantly worse since stopping amitriptyline. He has had GI work up but would like second opinion from motility specialist Dr. Gareth Morgan at [facility]. He reports he has not been able to work since October due to depression and persistent nausea and vomiting.    ------------------------------------------------------------------------------------  Allergies  Allergen Reactions  . Bupropion     Extreme anxiety  . Morphine      Current Outpatient Medications:  .  Dulaglutide (TRULICITY) A999333 0000000 SOPN, Inject 0.75 mg into the skin once a week., Disp: 4 pen, Rfl: 2 .  FARXIGA 10 MG TABS tablet, TAKE 1 TABLET BY MOUTH DAILY, Disp: 30 tablet, Rfl: 6 .   fluconazole (DIFLUCAN) 150 MG tablet, TAKE 1 TABLET BY MOUTH AS ONE DOSE, Disp: 1 tablet, Rfl: 5 .  glipiZIDE (GLUCOTROL XL) 10 MG 24 hr tablet, TAKE 1 TABLET BY MOUTH DAILY WITH BREAKFAST., Disp: 90 tablet, Rfl: 4 .  glucose blood (ONETOUCH VERIO) test strip, 1 strip daily. Reported on 10/20/2015, Disp: , Rfl:  .  ketoconazole (NIZORAL) 2 % cream, APPLY TO AFFECTED AREA ONCE DAILY FOR 7 DAYS AS NEEDED, Disp: 15 g, Rfl: 3 .  lamoTRIgine (LAMICTAL) 25 MG tablet, TAKE ONE TABLET DAILY FOR 14 DAYS, THEN INCREASE TO 2 TABLETS DAILY, Disp: 60 tablet, Rfl: 5 .  metFORMIN (GLUCOPHAGE-XR) 500 MG 24 hr tablet, TAKE 2 TABLETS BY MOUTH TWICE A DAY, Disp: 120 tablet, Rfl: 3 .  omeprazole (PRILOSEC) 40 MG capsule, TAKE 1 CAPSULE (40 MG TOTAL) BY MOUTH DAILY., Disp: 30 capsule, Rfl: 11 .  ondansetron (ZOFRAN) 4 MG tablet, Take 1 tablet (4 mg total) by mouth every 8 (eight) hours as needed for nausea or vomiting., Disp: 20 tablet, Rfl: 0 .  oxyCODONE-acetaminophen (PERCOCET) 7.5-325 MG tablet, Take 1 tablet by mouth every 4 (four) hours as needed for severe pain., Disp: 60 tablet, Rfl: 0 .  predniSONE (DELTASONE) 10 MG tablet, 6 TABLETS FOR 2 DAYS, THEN 5 FOR 2 DAYS, THEN 4 FOR 2 DAYS, THEN 3 FOR 2 DAYS, THEN 2 FOR 2 DAYS, THEN 1 FOR 2 DAYS., Disp: 42 tablet, Rfl: 1 .  QUEtiapine (SEROQUEL) 50 MG tablet, Take 1 tablet (50 mg  total) by mouth at bedtime., Disp: 30 tablet, Rfl: 1 .  rosuvastatin (CRESTOR) 20 MG tablet, Take 1 tablet (20 mg total) by mouth daily., Disp: 30 tablet, Rfl: 5 .  sildenafil (VIAGRA) 50 MG tablet, TAKE ONE OR TWO TABLETS AS NEEDED, NO MORE THAN 2 IN A DAY INSURANCE COVERS 6 TAB IN 25 DAYS, Disp: 6 tablet, Rfl: 5 .  Vilazodone HCl (VIIBRYD) 40 MG TABS, Take 1 tablet (40 mg total) by mouth daily., Disp: 30 tablet, Rfl: 1  Review of Systems  Constitutional: Negative for appetite change, chills and fever.  Respiratory: Negative for chest tightness, shortness of breath and wheezing.     Cardiovascular: Negative for chest pain and palpitations.  Gastrointestinal: Positive for vomiting. Negative for abdominal pain and nausea.    Social History   Tobacco Use  . Smoking status: Former Smoker    Years: 5.00    Quit date: 06/17/1997    Years since quitting: 22.0  . Smokeless tobacco: Never Used  . Tobacco comment: started smoking at ahe 16, and quit at age 26  Substance Use Topics  . Alcohol use: Yes    Alcohol/week: 0.0 standard drinks    Comment: occasional alcohol use      Objective:   There were no vitals taken for this visit. There were no vitals filed for this visit.There is no height or weight on file to calculate BMI.   Physical Exam Awake, alert, oriented x 3. In no apparent distress      Assessment & Plan     1. Post-cholecystectomy syndrome  - dicyclomine (BENTYL) 20 MG tablet; Take 1 tablet (20 mg total) by mouth every 6 (six) hours. For irritable bowel  Dispense: 120 tablet; Refill: 1 - Ambulatory referral to Gastroenterology  2. Nausea and vomiting, intractability of vomiting not specified, unspecified vomiting type  - Ambulatory referral to Gastroenterology  3. Cyclic vomiting syndrome  Symptoms significantly worse since being taking off of amitriptyline by psychiatry due to potential drug-drug interaction with Viibryd   - Ambulatory referral to Gastroenterology   I discussed the assessment and treatment plan with the patient. The patient was provided an opportunity to ask questions and all were answered. The patient agreed with the plan and demonstrated an understanding of the instructions.   The patient was advised to call back or seek an in-person evaluation if the symptoms worsen or if the condition fails to improve as anticipated.  I provided 12 minutes of non-face-to-face time during this encounter.      [name], MD  [facility]

## 2019-06-22 ENCOUNTER — Telehealth (INDEPENDENT_AMBULATORY_CARE_PROVIDER_SITE_OTHER): Payer: 59 | Admitting: Family Medicine

## 2019-06-22 DIAGNOSIS — R112 Nausea with vomiting, unspecified: Secondary | ICD-10-CM | POA: Diagnosis not present

## 2019-06-22 DIAGNOSIS — K915 Postcholecystectomy syndrome: Secondary | ICD-10-CM

## 2019-06-22 DIAGNOSIS — R1115 Cyclical vomiting syndrome unrelated to migraine: Secondary | ICD-10-CM

## 2019-06-22 MED ORDER — DICYCLOMINE HCL 20 MG PO TABS: 20.0000 mg | ORAL_TABLET | Freq: Four times a day (QID) | ORAL | 1 refills | Status: DC

## 2019-06-23 ENCOUNTER — Encounter: Payer: Self-pay | Admitting: Psychiatry

## 2019-06-23 ENCOUNTER — Ambulatory Visit (INDEPENDENT_AMBULATORY_CARE_PROVIDER_SITE_OTHER): Payer: 59 | Admitting: Psychiatry

## 2019-06-23 ENCOUNTER — Other Ambulatory Visit: Payer: Self-pay

## 2019-06-23 DIAGNOSIS — F4010 Social phobia, unspecified: Secondary | ICD-10-CM

## 2019-06-23 DIAGNOSIS — F331 Major depressive disorder, recurrent, moderate: Secondary | ICD-10-CM | POA: Diagnosis not present

## 2019-06-23 DIAGNOSIS — F411 Generalized anxiety disorder: Secondary | ICD-10-CM | POA: Diagnosis not present

## 2019-06-23 DIAGNOSIS — F609 Personality disorder, unspecified: Secondary | ICD-10-CM | POA: Diagnosis not present

## 2019-06-23 MED ORDER — LAMOTRIGINE 100 MG PO TABS: 100.0000 mg | ORAL_TABLET | Freq: Every day | ORAL | 0 refills | Status: DC

## 2019-06-23 MED ORDER — LAMOTRIGINE 25 MG PO TABS: 75.0000 mg | ORAL_TABLET | Freq: Every day | ORAL | 0 refills | Status: DC

## 2019-06-23 NOTE — Progress Notes (Signed)
Virtual Visit via Video Note  I connected with Raymond Rose on 06/23/19 at 10:45 AM EST by a video enabled telemedicine application and verified that I am speaking with the correct person using two identifiers.   I discussed the limitations of evaluation and management by telemedicine and the availability of in person appointments. The patient expressed understanding and agreed to proceed.   I discussed the assessment and treatment plan with the patient. The patient was provided an opportunity to ask questions and all were answered. The patient agreed with the plan and demonstrated an understanding of the instructions.   The patient was advised to call back or seek an in-person evaluation if the symptoms worsen or if the condition fails to improve as anticipated.  [place] MD OP Progress Note  06/23/2019 1:57 PM Raymond Rose  MRN:  [id number]  Chief Complaint:  Chief Complaint    Follow-up     HPI: Raymond Rose is a 44 year old Caucasian male, employed, married, lives in [place], has a history of depression, GAD, social anxiety disorder, diabetes melitis, cyclical vomiting, obstructive sleep apnea, erectile dysfunction, arthritis, was evaluated by telemedicine today.  Patient today reports he is currently back in [place].  He visited his mother in [place] for a few days during the holiday season.  He reports the visit went well.  Patient reports mood wise he is making progress.  He does not believe his depression is as bad as it used to be the last time he spoke to Probation officer.  He has noticed improvement on the medications.  He however reports he has noticed some crying spells recently.  He reports this has been going on for since the past several months.  He did not have this problem before.  He reports anything can make him cry.  Patient reports there are times when he has negative thoughts-he does struggle with chronic suicidality which is always at the back of his mind.  He however  currently denies any active suicidal thoughts or plans.  He reports he is compliant on medications as prescribed.  He reports sleep is improving.  Patient reports he has his first appointment with Ms. Tina Thompson-therapist on Monday.  He looks forward to that.  He was referred for IOP however he reports he wants to wait and wants to try individual psychotherapy sessions first.  Patient reports he does struggle with GI problems and he had an appointment with his primary care provider recently.  Writer reviewed notes from his visit per Dr. Shelly Flatten 06/22/2019-' postcholecystectomy syndrome-Bentyl 20 mg every 6 hours-referred to gastroenterology.  Nausea and vomiting-unspecified-referred to gastroenterologist.  Cyclic vomiting syndrome-symptoms significantly worse since being taken off of amitriptyline by psychiatry due to potential drug to drug interaction with Viibryd.  Ambulatory referral to gastroenterology.'  Patient denies any other concerns today. Visit Diagnosis:    ICD-10-CM   1. MDD (major depressive disorder), recurrent episode, moderate (HCC)  F33.1 lamoTRIgine (LAMICTAL) 100 MG tablet  2. GAD (generalized anxiety disorder)  F41.1   3. Social anxiety disorder  F40.10   4. Personality disorder, unspecified ([place])  F60.9 lamoTRIgine (LAMICTAL) 100 MG tablet    lamoTRIgine (LAMICTAL) 25 MG tablet    DISCONTINUED: lamoTRIgine (LAMICTAL) 25 MG tablet    Past Psychiatric History: Reviewed past psychiatric history from my progress note on 04/21/2019.  Past trials of Wellbutrin-side effects, Lexapro-initially worked but stopped working after that, Effexor,, Actor GI problems  Past Medical History:  Past Medical History:  Diagnosis Date  .  Anxiety   . Arthritis   . Depression   . Diabetes mellitus, type II ([place])   . OSA on CPAP   . Vertigo 01/03/2015    Past Surgical History:  Procedure Laterality Date  . 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 CHOLECYSTECTOMY  2010   lap  Cholecystectomy  . 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 CT scan of Brain  12/23/2011   without contrast, [facility], Normal  . TONSILLECTOMY  1985    Family Psychiatric History: I have reviewed family psychiatric history from my progress note on 04/21/2019  Family History:  Family History  Problem Relation Age of Onset  . Depression Mother   . Heart attack Father 82  . Arthritis Other   . Alcohol abuse Other   . Lung cancer Other   . Hyperlipidemia Other   . CAD Other   . Stroke Other   . Hypertension Other   . Bipolar disorder Other     Social History: Reviewed social history from my progress note on 04/21/2019 Social History   Socioeconomic History  . Marital status: Married    Spouse name: Not on file  . Number of children: Not on file  . Years of education: Not on file  . Highest education level: Not on file  Occupational History  . Occupation: Banking    Comment: Engineer, manufacturing  Tobacco Use  . Smoking status: Former Smoker    Years: 5.00    Quit date: 06/17/1997    Years since quitting: 22.0  . Smokeless tobacco: Never Used  . Tobacco comment: started smoking at ahe 16, and quit at age 13  Substance and Sexual Activity  . Alcohol use: Yes    Alcohol/week: 0.0 standard drinks    Comment: occasional alcohol use  . Drug use: No  . Sexual activity: Not on file  Other Topics Concern  . Not on file  Social History Narrative  . Not on file   Social Determinants of Health   Financial Resource Strain:   . Difficulty of Paying Living Expenses: Not on file  Food Insecurity:   . Worried About Charity fundraiser in the Last Year: Not on file  . Ran Out of Food in the Last Year: Not on file  Transportation Needs:   . Lack of Transportation (Medical): Not on file  . Lack of Transportation (Non-Medical): Not on file  Physical Activity:   . Days of Exercise per Week: Not on file  .  Minutes of Exercise per Session: Not on file  Stress:   . Feeling of Stress : Not on file  Social Connections:   . Frequency of Communication with Friends and Family: Not on file  . Frequency of Social Gatherings with Friends and Family: Not on file  . Attends Religious Services: Not on file  . Active Member of Clubs or Organizations: Not on file  . Attends Archivist Meetings: Not on file  . Marital Status: Not on file    Allergies:  Allergies  Allergen Reactions  . Bupropion     Extreme anxiety  . Morphine     Metabolic Disorder Labs: Lab Results  Component Value Date   HGBA1C 8.7 (H) 05/24/2019   No results found for: PROLACTIN Lab Results  Component Value Date   CHOL 173 05/24/2019   TRIG 718 (HH) 05/24/2019  HDL 30 (L) 05/24/2019   CHOLHDL 5.8 (H) 05/24/2019   LDLCALC 42 05/24/2019   LDLCALC Comment 10/30/2018   Lab Results  Component Value Date   TSH 1.360 05/24/2019   TSH 1.570 01/24/2017    Therapeutic Level Labs: No results found for: LITHIUM No results found for: VALPROATE No components found for:  CBMZ  Current Medications: Current Outpatient Medications  Medication Sig Dispense Refill  . dicyclomine (BENTYL) 20 MG tablet Take 1 tablet (20 mg total) by mouth every 6 (six) hours. For irritable bowel 120 tablet 1  . Dulaglutide (TRULICITY) A999333 0000000 SOPN Inject 0.75 mg into the skin once a week. 4 pen 2  . FARXIGA 10 MG TABS tablet TAKE 1 TABLET BY MOUTH DAILY 30 tablet 6  . fluconazole (DIFLUCAN) 150 MG tablet TAKE 1 TABLET BY MOUTH AS ONE DOSE 1 tablet 5  . glipiZIDE (GLUCOTROL XL) 10 MG 24 hr tablet TAKE 1 TABLET BY MOUTH DAILY WITH BREAKFAST. 90 tablet 4  . glucose blood (ONETOUCH VERIO) test strip 1 strip daily. Reported on 10/20/2015    . ketoconazole (NIZORAL) 2 % cream APPLY TO AFFECTED AREA ONCE DAILY FOR 7 DAYS AS NEEDED 15 g 3  . lamoTRIgine (LAMICTAL) 25 MG tablet Take 3 tablets (75 mg total) by mouth daily. Dose change- for 2  weeks and stop 90 tablet 0  . metFORMIN (GLUCOPHAGE-XR) 500 MG 24 hr tablet TAKE 2 TABLETS BY MOUTH TWICE A DAY 120 tablet 3  . omeprazole (PRILOSEC) 40 MG capsule TAKE 1 CAPSULE (40 MG TOTAL) BY MOUTH DAILY. 30 capsule 11  . ondansetron (ZOFRAN) 4 MG tablet Take 1 tablet (4 mg total) by mouth every 8 (eight) hours as needed for nausea or vomiting. 20 tablet 0  . oxyCODONE-acetaminophen (PERCOCET) 7.5-325 MG tablet Take 1 tablet by mouth every 4 (four) hours as needed for severe pain. 60 tablet 0  . predniSONE (DELTASONE) 10 MG tablet 6 TABLETS FOR 2 DAYS, THEN 5 FOR 2 DAYS, THEN 4 FOR 2 DAYS, THEN 3 FOR 2 DAYS, THEN 2 FOR 2 DAYS, THEN 1 FOR 2 DAYS. 42 tablet 1  . QUEtiapine (SEROQUEL) 50 MG tablet Take 1 tablet (50 mg total) by mouth at bedtime. 30 tablet 1  . rosuvastatin (CRESTOR) 20 MG tablet Take 1 tablet (20 mg total) by mouth daily. 30 tablet 5  . sildenafil (VIAGRA) 50 MG tablet TAKE ONE OR TWO TABLETS AS NEEDED, NO MORE THAN 2 IN A DAY INSURANCE COVERS 6 TAB IN 25 DAYS 6 tablet 5  . Vilazodone HCl (VIIBRYD) 40 MG TABS Take 1 tablet (40 mg total) by mouth daily. 30 tablet 1  . lamoTRIgine (LAMICTAL) 100 MG tablet Take 1 tablet (100 mg total) by mouth daily. For mood 90 tablet 0   No current facility-administered medications for this visit.     Musculoskeletal: Strength & Muscle Tone: UTA Gait & Station: normal Patient leans: N/A  Psychiatric Specialty Exam: Review of Systems  Psychiatric/Behavioral: Positive for dysphoric mood.       Mood lability  All other systems reviewed and are negative.   There were no vitals taken for this visit.There is no height or weight on file to calculate BMI.  General Appearance: Casual  Eye Contact:  Fair  Speech:  Clear and Coherent  Volume:  Normal  Mood:  Dysphoric  Affect:  Congruent  Thought Process:  Goal Directed and Descriptions of Associations: Intact  Orientation:  Full (Time, Place, and Person)  Thought Content:  Logical    Suicidal Thoughts:  No  Homicidal Thoughts:  No  Memory:  Immediate;   Fair Recent;   Fair Remote;   Fair  Judgement:  Fair  Insight:  Fair  Psychomotor Activity:  Normal  Concentration:  Concentration: Fair and Attention Span: Fair  Recall:  [employer] of Knowledge: Fair  Language: Fair  Akathisia:  No  Handed:  Right  AIMS (if indicated): Denies tremors,rigidity  Assets:  Communication Skills Desire for Improvement Housing Social Support  ADL's:  Intact  Cognition: WNL  Sleep:  Fair   Screenings: GAD-7     Office Visit from 04/16/2019 in [place]  Total GAD-7 Score  20    PHQ2-9     Office Visit from 04/16/2019 in [facility] Visit from 02/10/2018 in [facility] Visit from 12/24/2016 in [facility] Visit from 05/23/2016 in [facility] Visit from 05/20/2016 in [facility]  PHQ-2 Total Score  6  1  1   0  0  PHQ-9 Total Score  25  4  1   --  3       Assessment and Plan: Zenon is a 44 year old Caucasian male, employed, lives in [place], has a history of depression, anxiety, obstructive sleep apnea on CPAP, diabetes melitis, arthritis was evaluated by telemedicine today.  Patient is biologically predisposed given his history of trauma, family history of mental health problems.  Patient is currently making some progress on the current medication regimen.  He will benefit from individual psychotherapy session and possible intensive outpatient program or partial hospitalization program if his symptoms get worse.  Patient with chronic suicidality as well as mood lability likely meets criteria for borderline personality traits, will continue to monitor closely.  Plan MDD-some progress Viibryd 40 mg p.o. daily Increase lamotrigine to 75 mg p.o. daily for 2 weeks and then to 100 mg p.o. daily Seroquel 50 mg p.o. nightly Patient was referred for IOP-however has been  noncompliant Patient reports he has upcoming appointment with therapist on [place] Ms. Miguel Dibble.  Encouraged him to keep it.  GAD-improving Referred for CBT  Social anxiety disorder-some progress Referred for CBT  I have reviewed TSH labs-dated 05/24/2019-within normal limits  Personality disorder unspecified-patient with mood lability, chronic suicidality-likely meets criteria for borderline personality disorder traits.  Will monitor closely.  I have reviewed medical records in [facility] per Dr. Patric Dykes care provider as summarized above.  Follow-up in clinic in 2 weeks or sooner if needed.  January 25 at 2:15 PM  I have spent atleast 30 minutes non face to face with patient today. More than 50 % of the time was spent for obtaining and to review and separately obtained history , ordering medications and test ,psychoeducation and supportive psychotherapy and care coordination,as well as documenting clinical information in electronic health record. This note was generated in part or whole with voice recognition software. Voice recognition is usually quite accurate but there are transcription errors that can and very often do occur. I apologize for any typographical errors that were not detected and corrected.       Ursula Alert, MD 06/23/2019, 1:57 PM

## 2019-06-25 ENCOUNTER — Other Ambulatory Visit: Payer: Self-pay | Admitting: Family Medicine

## 2019-06-25 DIAGNOSIS — M545 Low back pain, unspecified: Secondary | ICD-10-CM

## 2019-06-25 DIAGNOSIS — G8929 Other chronic pain: Secondary | ICD-10-CM

## 2019-06-25 NOTE — Telephone Encounter (Signed)
Please review, patient last office visit to address chronic back pain was 08/10/18, prescroption was last filled 03/04/19

## 2019-06-25 NOTE — Telephone Encounter (Signed)
Please review for refill for prednisone 10 mg that was ordered in November 2020 and fluconazole 150 mg  That does not have a refill protocol assigned to it.

## 2019-06-28 MED ORDER — OXYCODONE-ACETAMINOPHEN 7.5-325 MG PO TABS: 1.0000 | ORAL_TABLET | ORAL | 0 refills | Status: DC | PRN

## 2019-06-28 NOTE — Telephone Encounter (Signed)
Attempted to contact patient, no answer and voicemail is full. Trying to get more information as to why patient needs refills. Patient will need a virtual appointment.

## 2019-07-01 ENCOUNTER — Ambulatory Visit: Payer: Self-pay | Admitting: *Deleted

## 2019-07-01 MED ORDER — PROMETHAZINE HCL 25 MG PO TABS: 25.0000 mg | ORAL_TABLET | Freq: Four times a day (QID) | ORAL | 0 refills | Status: DC | PRN

## 2019-07-01 MED ORDER — ALPRAZOLAM 0.5 MG PO TABS: 0.5000 mg | ORAL_TABLET | ORAL | 0 refills | Status: DC | PRN

## 2019-07-01 NOTE — Addendum Note (Signed)
Addended by: Birdie Sons on: 07/01/2019 08:44 PM   Modules accepted: Orders

## 2019-07-01 NOTE — Telephone Encounter (Signed)
Wife calls she and the patient are Covid 19 positive since January 7. All of his covid symptoms seemed to improve by day 3-4 now vomiting often over the last 36 hours. Reported he is drinking sips of sports drinks and keeping it down for sometimes a few hours.Has drank at least 60 oz gatorade today.He has been diagnosed with cyclic vomiting in the past. Today he has vomited at least 6 occasions since waking up.Looks like bile yellowish she reports.. Denies any pain, no fever no SOB. Voided twice this morning. HR remaining around 100-120's today 02 sat 95 or higher. B/P 131/81 most recent. No fever. Is having stress related to his mother who has covid and declined rapidly today being removed from ventilator expected to pass. Requesting anti-nausea medication along with anti anxiety medication if possible. Reviewed s/sx of dehydration, she will continue to monitor his intake and output closely as sustained elevated heart rate could indicate dehydration. Pharmacy on file [facility] [address].Routing to provider for consideration. Please respond with any information via MyChart if necessary.  Error chose with protocol. This is not for vomiting a prescription medication. Just for vomiting. Reason for Disposition . Vomiting a prescription medication  Answer Assessment - Initial Assessment Questions 1. VOMITING SEVERITY: "How many times have you vomited in the past 24 hours?"     - MILD:  1 - 2 times/day    - MODERATE: 3 - 5 times/day, decreased oral intake without significant weight loss or symptoms of dehydration    - SEVERE: 6 or more times/day, vomits everything or nearly everything, with significant weight loss, symptoms of dehydration      Wife reporting at least 6 bouts of vomiting since patient woke up this morning.  2. ONSET: "When did the vomiting begin?"      Has been vomiting on/off over the last 36 hours.  3. FLUIDS: "What fluids or food have you vomited up today?" "Have you been able to  keep any fluids down?"     Appears like bile. Is drinking sips of sports drinks 4. ABDOMINAL PAIN: "Are your having any abdominal pain?" If yes : "How bad is it and what does it feel like?" (e.g., crampy, dull, intermittent, constant)       5. DIARRHEA: "Is there any diarrhea?" If so, ask: "How many times today?"      Mild diarrhea today 6. CONTACTS: "Is there anyone else in the family with the same symptoms?"      no 7. CAUSE: "What do you think is causing your vomiting?"     Stress or diagnosis of cyclic vomiting 8. HYDRATION STATUS: "Any signs of dehydration?" (e.g., dry mouth [not only dry lips], too weak to stand) "When did you last urinate?"     Denies these symptoms of dehydration. Voided twice today 9. OTHER SYMPTOMS: "Do you have any other symptoms?" (e.g., fever, headache, vertigo, vomiting blood or coffee grounds, recent head injury)     None of these 10. PREGNANCY: "Is there any chance you are pregnant?" "When was your last menstrual period?"       na  Protocols used: [facility]

## 2019-07-02 ENCOUNTER — Telehealth: Payer: Self-pay | Admitting: Psychiatry

## 2019-07-02 NOTE — Telephone Encounter (Signed)
Patient likely may have personality disorder traits - Raymond Rose will continue to reassess.

## 2019-07-03 ENCOUNTER — Inpatient Hospital Stay
Admission: EM | Admit: 2019-07-03 | Discharge: 2019-07-05 | DRG: 637 | Disposition: A | Discharge status: Home / Self Care | Payer: 59 | Attending: Internal Medicine | Admitting: Internal Medicine

## 2019-07-03 ENCOUNTER — Emergency Department: Payer: 59

## 2019-07-03 ENCOUNTER — Encounter: Payer: Self-pay | Admitting: Emergency Medicine

## 2019-07-03 ENCOUNTER — Other Ambulatory Visit: Payer: Self-pay

## 2019-07-03 DIAGNOSIS — R112 Nausea with vomiting, unspecified: Secondary | ICD-10-CM | POA: Diagnosis present

## 2019-07-03 DIAGNOSIS — Z7984 Long term (current) use of oral hypoglycemic drugs: Secondary | ICD-10-CM

## 2019-07-03 DIAGNOSIS — Z801 Family history of malignant neoplasm of trachea, bronchus and lung: Secondary | ICD-10-CM

## 2019-07-03 DIAGNOSIS — E860 Dehydration: Secondary | ICD-10-CM | POA: Diagnosis present

## 2019-07-03 DIAGNOSIS — E8729 Other acidosis: Secondary | ICD-10-CM

## 2019-07-03 DIAGNOSIS — F418 Other specified anxiety disorders: Secondary | ICD-10-CM | POA: Diagnosis not present

## 2019-07-03 DIAGNOSIS — R197 Diarrhea, unspecified: Secondary | ICD-10-CM | POA: Diagnosis present

## 2019-07-03 DIAGNOSIS — K219 Gastro-esophageal reflux disease without esophagitis: Secondary | ICD-10-CM | POA: Diagnosis present

## 2019-07-03 DIAGNOSIS — E1010 Type 1 diabetes mellitus with ketoacidosis without coma: Secondary | ICD-10-CM | POA: Diagnosis not present

## 2019-07-03 DIAGNOSIS — Z8249 Family history of ischemic heart disease and other diseases of the circulatory system: Secondary | ICD-10-CM

## 2019-07-03 DIAGNOSIS — Z818 Family history of other mental and behavioral disorders: Secondary | ICD-10-CM

## 2019-07-03 DIAGNOSIS — E782 Mixed hyperlipidemia: Secondary | ICD-10-CM | POA: Diagnosis not present

## 2019-07-03 DIAGNOSIS — F411 Generalized anxiety disorder: Secondary | ICD-10-CM | POA: Diagnosis present

## 2019-07-03 DIAGNOSIS — E119 Type 2 diabetes mellitus without complications: Secondary | ICD-10-CM

## 2019-07-03 DIAGNOSIS — E872 Acidosis: Secondary | ICD-10-CM

## 2019-07-03 DIAGNOSIS — U071 COVID-19: Secondary | ICD-10-CM | POA: Diagnosis present

## 2019-07-03 DIAGNOSIS — Z87891 Personal history of nicotine dependence: Secondary | ICD-10-CM

## 2019-07-03 DIAGNOSIS — G4733 Obstructive sleep apnea (adult) (pediatric): Secondary | ICD-10-CM | POA: Diagnosis present

## 2019-07-03 DIAGNOSIS — Z823 Family history of stroke: Secondary | ICD-10-CM

## 2019-07-03 DIAGNOSIS — E1110 Type 2 diabetes mellitus with ketoacidosis without coma: Secondary | ICD-10-CM | POA: Diagnosis not present

## 2019-07-03 DIAGNOSIS — Z6835 Body mass index (BMI) 35.0-35.9, adult: Secondary | ICD-10-CM

## 2019-07-03 DIAGNOSIS — Z79899 Other long term (current) drug therapy: Secondary | ICD-10-CM

## 2019-07-03 DIAGNOSIS — F329 Major depressive disorder, single episode, unspecified: Secondary | ICD-10-CM | POA: Diagnosis present

## 2019-07-03 HISTORY — DX: COVID-19: U07.1

## 2019-07-03 LAB — COMPREHENSIVE METABOLIC PANEL
ALT: 17 U/L (ref 0–44)
AST: 17 U/L (ref 15–41)
Albumin: 4.6 g/dL (ref 3.5–5.0)
Alkaline Phosphatase: 97 U/L (ref 38–126)
Anion gap: 24 — ABNORMAL HIGH (ref 5–15)
BUN: 16 mg/dL (ref 6–20)
CO2: 11 mmol/L — ABNORMAL LOW (ref 22–32)
Calcium: 10 mg/dL (ref 8.9–10.3)
Chloride: 102 mmol/L (ref 98–111)
Creatinine, Ser: 1.2 mg/dL (ref 0.61–1.24)
GFR calc Af Amer: >60 mL/min (ref >60)
GFR calc non Af Amer: >60 mL/min (ref >60)
Glucose, Bld: 247 mg/dL — ABNORMAL HIGH (ref 70–99)
Potassium: 4.3 mmol/L (ref 3.5–5.1)
Sodium: 137 mmol/L (ref 135–145)
Total Bilirubin: 2 mg/dL — ABNORMAL HIGH (ref 0.3–1.2)
Total Protein: 9.5 g/dL — ABNORMAL HIGH (ref 6.5–8.1)

## 2019-07-03 LAB — RESPIRATORY PANEL BY RT PCR (FLU A&B, COVID)
Influenza A by PCR: NEGATIVE
Influenza B by PCR: NEGATIVE
SARS Coronavirus 2 by RT PCR: POSITIVE — AB

## 2019-07-03 LAB — GLUCOSE, CAPILLARY
Glucose-Capillary: 145 mg/dL — ABNORMAL HIGH (ref 70–99)
Glucose-Capillary: 145 mg/dL — ABNORMAL HIGH (ref 70–99)

## 2019-07-03 LAB — BASIC METABOLIC PANEL
Anion gap: 16 — ABNORMAL HIGH (ref 5–15)
Anion gap: 15 (ref 5–15)
BUN: 14 mg/dL (ref 6–20)
BUN: 13 mg/dL (ref 6–20)
CO2: 16 mmol/L — ABNORMAL LOW (ref 22–32)
CO2: 16 mmol/L — ABNORMAL LOW (ref 22–32)
Calcium: 9 mg/dL (ref 8.9–10.3)
Calcium: 8.9 mg/dL (ref 8.9–10.3)
Chloride: 108 mmol/L (ref 98–111)
Chloride: 107 mmol/L (ref 98–111)
Creatinine, Ser: 0.96 mg/dL (ref 0.61–1.24)
Creatinine, Ser: 0.95 mg/dL (ref 0.61–1.24)
GFR calc Af Amer: >60 mL/min (ref >60)
GFR calc Af Amer: >60 mL/min (ref >60)
GFR calc non Af Amer: >60 mL/min (ref >60)
GFR calc non Af Amer: >60 mL/min (ref >60)
Glucose, Bld: 161 mg/dL — ABNORMAL HIGH (ref 70–99)
Glucose, Bld: 162 mg/dL — ABNORMAL HIGH (ref 70–99)
Potassium: 4 mmol/L (ref 3.5–5.1)
Potassium: 3.6 mmol/L (ref 3.5–5.1)
Sodium: 140 mmol/L (ref 135–145)
Sodium: 138 mmol/L (ref 135–145)

## 2019-07-03 LAB — CBC
HCT: 50.2 % (ref 39.0–52.0)
Hemoglobin: 16.3 g/dL (ref 13.0–17.0)
MCH: 27.6 pg (ref 26.0–34.0)
MCHC: 32.5 g/dL (ref 30.0–36.0)
MCV: 85.1 fL (ref 80.0–100.0)
Platelets: 427 10*3/uL — ABNORMAL HIGH (ref 150–400)
RBC: 5.9 MIL/uL — ABNORMAL HIGH (ref 4.22–5.81)
RDW: 15.2 % (ref 11.5–15.5)
WBC: 12.9 10*3/uL — ABNORMAL HIGH (ref 4.0–10.5)
nRBC: 0 % (ref 0.0–0.2)

## 2019-07-03 LAB — URINALYSIS, COMPLETE (UACMP) WITH MICROSCOPIC
Bacteria, UA: NONE SEEN
Bilirubin Urine: NEGATIVE
Glucose, UA: >=500 mg/dL — AB
Hgb urine dipstick: NEGATIVE
Ketones, ur: 80 mg/dL — AB
Leukocytes,Ua: NEGATIVE
Nitrite: NEGATIVE
Protein, ur: 100 mg/dL — AB
Specific Gravity, Urine: 1.035 — ABNORMAL HIGH (ref 1.005–1.030)
pH: 6 (ref 5.0–8.0)

## 2019-07-03 LAB — LIPASE, BLOOD: Lipase: 30 U/L (ref 11–51)

## 2019-07-03 LAB — BETA-HYDROXYBUTYRIC ACID
Beta-Hydroxybutyric Acid: 4.38 mmol/L — ABNORMAL HIGH (ref 0.05–0.27)
Beta-Hydroxybutyric Acid: 4.18 mmol/L — ABNORMAL HIGH (ref 0.05–0.27)

## 2019-07-03 LAB — BLOOD GAS, VENOUS
Acid-base deficit: 11.2 mmol/L — ABNORMAL HIGH (ref 0.0–2.0)
Bicarbonate: 16.4 mmol/L — ABNORMAL LOW (ref 20.0–28.0)
Patient temperature: 37
pCO2, Ven: 42 mmHg — ABNORMAL LOW (ref 44.0–60.0)
pH, Ven: 7.2 — ABNORMAL LOW (ref 7.250–7.430)

## 2019-07-03 LAB — LACTIC ACID, PLASMA: Lactic Acid, Venous: 1.8 mmol/L (ref 0.5–1.9)

## 2019-07-03 LAB — POC SARS CORONAVIRUS 2 AG: SARS Coronavirus 2 Ag: NEGATIVE

## 2019-07-03 MED ORDER — ALPRAZOLAM 0.5 MG PO TABS
0.5000 mg | ORAL_TABLET | Freq: Three times a day (TID) | ORAL | Status: DC | PRN
  Administered 2019-07-04: 0.5 mg via ORAL
  Filled 2019-07-03: qty 1

## 2019-07-03 MED ORDER — SODIUM CHLORIDE 0.9 % IV SOLN: INTRAVENOUS | Status: DC

## 2019-07-03 MED ORDER — SODIUM CHLORIDE 0.9% FLUSH: 3.0000 mL | Freq: Once | INTRAVENOUS | Status: DC

## 2019-07-03 MED ORDER — SODIUM CHLORIDE 0.9 % IV SOLN: Freq: Once | INTRAVENOUS | Status: DC

## 2019-07-03 MED ORDER — INSULIN ASPART 100 UNIT/ML ~~LOC~~ SOLN: 6.0000 [IU] | Freq: Once | SUBCUTANEOUS | Status: DC

## 2019-07-03 MED ORDER — LAMOTRIGINE 100 MG PO TABS
100.0000 mg | ORAL_TABLET | Freq: Every day | ORAL | Status: DC
  Administered 2019-07-04: 100 mg via ORAL
  Filled 2019-07-03: qty 1
  Filled 2019-07-03: qty 4

## 2019-07-03 MED ORDER — SODIUM CHLORIDE 0.9 % IV BOLUS
1000.0000 mL | Freq: Once | INTRAVENOUS | Status: AC
  Administered 2019-07-03: 13:00:00 1000 mL via INTRAVENOUS

## 2019-07-03 MED ORDER — QUETIAPINE FUMARATE 25 MG PO TABS
50.0000 mg | ORAL_TABLET | Freq: Every day | ORAL | Status: DC
  Administered 2019-07-04 (×2): 50 mg via ORAL
  Filled 2019-07-03 (×2): qty 2

## 2019-07-03 MED ORDER — VILAZODONE HCL 40 MG PO TABS
40.0000 mg | ORAL_TABLET | Freq: Every day | ORAL | Status: DC
  Administered 2019-07-04: 16:00:00 40 mg via ORAL
  Filled 2019-07-03 (×5): qty 1

## 2019-07-03 MED ORDER — PANTOPRAZOLE SODIUM 40 MG PO TBEC
40.0000 mg | DELAYED_RELEASE_TABLET | Freq: Every day | ORAL | Status: DC
  Administered 2019-07-04: 11:00:00 40 mg via ORAL
  Filled 2019-07-03 (×2): qty 1

## 2019-07-03 MED ORDER — ENOXAPARIN SODIUM 40 MG/0.4ML ~~LOC~~ SOLN
40.0000 mg | Freq: Two times a day (BID) | SUBCUTANEOUS | Status: DC
  Administered 2019-07-04: 40 mg via SUBCUTANEOUS
  Filled 2019-07-03: qty 0.4

## 2019-07-03 MED ORDER — PROMETHAZINE HCL 25 MG/ML IJ SOLN
12.5000 mg | Freq: Four times a day (QID) | INTRAMUSCULAR | Status: DC | PRN
  Administered 2019-07-03 – 2019-07-04 (×2): 12.5 mg via INTRAVENOUS
  Filled 2019-07-03 (×2): qty 1

## 2019-07-03 MED ORDER — KETOCONAZOLE 2 % EX CREA
1.0000 "application " | TOPICAL_CREAM | Freq: Every day | CUTANEOUS | Status: DC
  Filled 2019-07-03 (×2): qty 15

## 2019-07-03 MED ORDER — SODIUM CHLORIDE 0.9 % IV BOLUS
500.0000 mL | Freq: Once | INTRAVENOUS | Status: AC
  Administered 2019-07-03: 10:00:00 500 mL via INTRAVENOUS

## 2019-07-03 MED ORDER — HALOPERIDOL LACTATE 5 MG/ML IJ SOLN
5.0000 mg | Freq: Once | INTRAMUSCULAR | Status: AC
  Administered 2019-07-03: 13:00:00 5 mg via INTRAVENOUS
  Filled 2019-07-03: qty 1

## 2019-07-03 MED ORDER — ONDANSETRON HCL 4 MG/2ML IJ SOLN
4.0000 mg | Freq: Three times a day (TID) | INTRAMUSCULAR | Status: DC | PRN
  Administered 2019-07-03 – 2019-07-04 (×2): 4 mg via INTRAVENOUS
  Filled 2019-07-03 (×2): qty 2

## 2019-07-03 MED ORDER — ROSUVASTATIN CALCIUM 10 MG PO TABS
20.0000 mg | ORAL_TABLET | Freq: Every day | ORAL | Status: DC
  Administered 2019-07-04: 11:00:00 20 mg via ORAL
  Filled 2019-07-03: qty 2
  Filled 2019-07-03 (×2): qty 1
  Filled 2019-07-03: qty 2

## 2019-07-03 MED ORDER — DEXTROSE 50 % IV SOLN: 0.0000 mL | INTRAVENOUS | Status: DC | PRN

## 2019-07-03 MED ORDER — INSULIN ASPART 100 UNIT/ML ~~LOC~~ SOLN
6.0000 [IU] | Freq: Once | SUBCUTANEOUS | Status: AC
  Administered 2019-07-03: 6 [IU] via INTRAVENOUS
  Filled 2019-07-03: qty 1

## 2019-07-03 MED ORDER — OXYCODONE-ACETAMINOPHEN 7.5-325 MG PO TABS
1.0000 | ORAL_TABLET | ORAL | Status: DC | PRN
  Administered 2019-07-04: 21:00:00 1 via ORAL
  Filled 2019-07-03: qty 1

## 2019-07-03 MED ORDER — ONDANSETRON HCL 4 MG/2ML IJ SOLN
4.0000 mg | Freq: Once | INTRAMUSCULAR | Status: AC
  Administered 2019-07-03: 10:00:00 4 mg via INTRAVENOUS
  Filled 2019-07-03: qty 2

## 2019-07-03 MED ORDER — DEXTROSE-NACL 5-0.45 % IV SOLN: INTRAVENOUS | Status: DC

## 2019-07-03 MED ORDER — SODIUM CHLORIDE 0.9 % IV BOLUS
500.0000 mL | Freq: Once | INTRAVENOUS | Status: AC
  Administered 2019-07-03: 13:00:00 500 mL via INTRAVENOUS

## 2019-07-03 MED ORDER — DICYCLOMINE HCL 20 MG PO TABS
20.0000 mg | ORAL_TABLET | Freq: Four times a day (QID) | ORAL | Status: DC
  Administered 2019-07-04 – 2019-07-05 (×4): 20 mg via ORAL
  Filled 2019-07-03 (×8): qty 1

## 2019-07-03 MED ORDER — INSULIN REGULAR(HUMAN) IN NACL 100-0.9 UT/100ML-% IV SOLN
INTRAVENOUS | Status: DC
  Administered 2019-07-03: 4.2 [IU]/h via INTRAVENOUS
  Filled 2019-07-03: qty 100

## 2019-07-03 MED ORDER — POTASSIUM CHLORIDE 10 MEQ/100ML IV SOLN
10.0000 meq | INTRAVENOUS | Status: AC
  Administered 2019-07-03 – 2019-07-04 (×2): 10 meq via INTRAVENOUS
  Filled 2019-07-03 (×2): qty 100

## 2019-07-03 NOTE — Progress Notes (Signed)
Received request from RN to change status of admission to medsurg due to resolution of acidosis No evidence of acidosis resolution as anion gap is 15. Unsure of glucose monitoring values as they are not documented since about 8 pm.  With CBG 145 Reviewed need for continued dextrose fluids with IV insulin directed by endo tool until anion gap 12 or lower.

## 2019-07-03 NOTE — ED Notes (Signed)
Anion gap closed to 15 and most recent CBG at 145. Contacted provider to request review of insulin drip, potassium and change bed to stepdown or medsurg as appropriate.

## 2019-07-03 NOTE — ED Notes (Signed)
Pt states he tested + for COVID last week. States N&V&D x 3 days. Denies fever in the last few days, states had a fever when he was tested. A&O, ambulatory. No distress noted at this time. Sitting on side of stretcher. Has a gatorade bottle with him. Informed of need for urine sample.

## 2019-07-03 NOTE — ED Triage Notes (Addendum)
Pt to ED via POV for vomiting for 3-4 days. Pt is not able to keep anything down. Pt is COVID positive, tested positive on 01/7. Pt is in NAD at this time.

## 2019-07-03 NOTE — H&P (Signed)
History and Physical    Raymond Rose [id number] DOB: [birth year] DOA: 07/03/2019  Referring MD/NP/PA:   PCP: Birdie Sons, MD   Patient coming from:  The patient is coming from home.  At baseline, pt is independent for most of ADL.        Chief Complaint: Nausea, vomiting, diarrhea  HPI: Raymond Rose is a 44 y.o. male with medical history significant of diabetes, hyperlipidemia, depression with anxiety, OSA, who presents with nausea, vomiting, diarrhea.  Patient states that he was recently tested positive for COVID-19 on 06/24/19. Initially he had some fever, chills, cough, which have all resolved.  Patient does not have any shortness breath or chest pain today.  He states that he has been having nausea, vomiting, diarrhea in the past 3 days, which has worsened today.  He has multiple nonbloody nonbiliary vomitus each day.  He has several episode of watery diarrhea.  He has mild abdominal discomfort and cramps. No symptoms of UTI or unilateral weakness.  ED Course: pt was found to have DAK (blood sugar 247, bicarbonate 11, anion gap 24, urinalysis positive for ketone 80, hydroxybutyric acid is 4.38), WBC 12.9, creatinine 1.20, BUN 16, GFR 60, temperature normal, blood pressure 137/82, tachycardia, oxygen saturation 100% on room air.  Chest x-ray is negative.  Patient is placed on stepdown bed for observation.  Review of Systems:   General: no fevers, chills, no body weight gain, has poor appetite, has fatigue HEENT: no blurry vision, hearing changes or sore throat Respiratory: no dyspnea, coughing, wheezing CV: no chest pain, no palpitations GI: has nausea, vomiting, diarrhea, no constipation GU: no dysuria, burning on urination, increased urinary frequency, hematuria  Ext: no leg edema Neuro: no unilateral weakness, numbness, or tingling, no vision change or hearing loss Skin: no rash, no skin tear. MSK: No muscle spasm, no deformity, no limitation of range of movement in  spin Heme: No easy bruising.  Travel history: No recent long distant travel.  Allergy:  Allergies  Allergen Reactions  . Bupropion     Extreme anxiety  . Morphine     Past Medical History:  Diagnosis Date  . Anxiety   . Arthritis   . Depression   . Diabetes mellitus, type II ([place])   . OSA on CPAP   . Vertigo 01/03/2015    Past Surgical History:  Procedure Laterality Date  . 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 CHOLECYSTECTOMY  2010   lap Cholecystectomy  . 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 CT scan of Brain  12/23/2011   without contrast, [facility], Normal  . TONSILLECTOMY  1985    Social History:  reports that he quit smoking about 22 years ago. He quit after 5.00 years of use. He has never used smokeless tobacco. He reports current alcohol use. He reports that he does not use drugs.  Family History:  Family History  Problem Relation Age of Onset  . Depression Mother   . Heart attack Father 57  . Arthritis Other   . Alcohol abuse Other   . Lung cancer Other   . Hyperlipidemia Other   . CAD Other   . Stroke Other   . Hypertension Other   . Bipolar disorder Other      Prior to Admission medications   Medication Sig Start Date End Date Taking? Authorizing Provider  ALPRAZolam (  XANAX) 0.5 MG tablet Take 1 tablet (0.5 mg total) by mouth every 4 (four) hours as needed. For anxiety 07/01/19   Birdie Sons, MD  dicyclomine (BENTYL) 20 MG tablet Take 1 tablet (20 mg total) by mouth every 6 (six) hours. For irritable bowel 06/22/19   Birdie Sons, MD  Dulaglutide (TRULICITY) [id number] [id number] SOPN Inject 0.75 mg into the skin once a week. 04/08/19 07/07/19  Trinna Post, PA-C  FARXIGA 10 MG TABS tablet TAKE 1 TABLET BY MOUTH DAILY 03/18/19   Birdie Sons, MD  fluconazole (DIFLUCAN) 150 MG tablet TAKE 1 TABLET BY MOUTH AS ONE DOSE 06/29/19   Birdie Sons, MD  glipiZIDE  (GLUCOTROL XL) 10 MG 24 hr tablet TAKE 1 TABLET BY MOUTH DAILY WITH BREAKFAST. 09/03/18   Birdie Sons, MD  glucose blood [facility] VERIO) test strip 1 strip daily. Reported on 10/20/2015 06/30/13   [provider]  ketoconazole (NIZORAL) 2 % cream APPLY TO AFFECTED AREA ONCE DAILY FOR 7 DAYS AS NEEDED 03/21/19   Birdie Sons, MD  lamoTRIgine (LAMICTAL) 100 MG tablet Take 1 tablet (100 mg total) by mouth daily. For mood 06/23/19   Ursula Alert, MD  lamoTRIgine (LAMICTAL) 25 MG tablet Take 3 tablets (75 mg total) by mouth daily. Dose change- for 2 weeks and stop 06/23/19   Ursula Alert, MD  metFORMIN (GLUCOPHAGE-XR) 500 MG 24 hr tablet TAKE 2 TABLETS BY MOUTH TWICE A DAY 11/12/18   Birdie Sons, MD  omeprazole (PRILOSEC) 40 MG capsule TAKE 1 CAPSULE (40 MG TOTAL) BY MOUTH DAILY. 12/11/18   Birdie Sons, MD  ondansetron (ZOFRAN) 4 MG tablet Take 1 tablet (4 mg total) by mouth every 8 (eight) hours as needed for nausea or vomiting. 09/18/17   Birdie Sons, MD  oxyCODONE-acetaminophen (PERCOCET) 7.5-325 MG tablet Take 1 tablet by mouth every 4 (four) hours as needed for severe pain. 06/28/19   Birdie Sons, MD  predniSONE (DELTASONE) 10 MG tablet 6 TABLETS FOR 2 DAYS, THEN 5 FOR 2 DAYS, THEN 4 FOR 2 DAYS, THEN 3 FOR 2 DAYS, THEN 2 FOR 2 DAYS, THEN 1 FOR 2 DAYS. 06/29/19   Birdie Sons, MD  promethazine (PHENERGAN) 25 MG tablet Take 1 tablet (25 mg total) by mouth every 6 (six) hours as needed for nausea or vomiting. 07/01/19   Birdie Sons, MD  QUEtiapine (SEROQUEL) 50 MG tablet Take 1 tablet (50 mg total) by mouth at bedtime. 05/31/19   Ursula Alert, MD  rosuvastatin (CRESTOR) 20 MG tablet Take 1 tablet (20 mg total) by mouth daily. 05/31/19   Birdie Sons, MD  sildenafil (VIAGRA) 50 MG tablet TAKE ONE OR TWO TABLETS AS NEEDED, NO MORE THAN 2 IN A DAY INSURANCE COVERS 6 TAB IN 25 DAYS 02/25/19   Birdie Sons, MD  Vilazodone HCl (VIIBRYD) 40 MG TABS Take 1 tablet  (40 mg total) by mouth daily. 05/31/19   Ursula Alert, MD    Physical Exam: Vitals:   07/03/19 1130 07/03/19 1200 07/03/19 1230 07/03/19 1306  BP:    (!) 119/56  Pulse: (!) 111 (!) 111 (!) 107 (!) 129  Resp:      Temp:      TempSrc:      SpO2: 100% 99% 98% 99%  Weight:      Height:       General: Not in acute distress.  Dry mucus membrane HEENT:  Eyes: PERRL, EOMI, no scleral icterus.       ENT: No discharge from the ears and nose, no pharynx injection, no tonsillar enlargement.        Neck: No JVD, no bruit, no mass felt. Heme: No neck lymph node enlargement. Cardiac: S1/S2, RRR, No murmurs, No gallops or rubs. Respiratory: No rales, wheezing, rhonchi or rubs. GI: Soft, nondistended, nontender, no rebound pain, no organomegaly, BS present. GU: No hematuria Ext: No pitting leg edema bilaterally. 2+DP/PT pulse bilaterally. Musculoskeletal: No joint deformities, No joint redness or warmth, no limitation of ROM in spin. Skin: No rashes.  Neuro: Alert, oriented X3, cranial nerves II-XII grossly intact, moves all extremities normally.  Psych: Patient is not psychotic, no suicidal or hemocidal ideation.  Labs on Admission: I have personally reviewed following labs and imaging studies  CBC: Recent Labs  Lab 07/03/19 0855  WBC 12.9*  HGB 16.3  HCT 50.2  MCV 85.1  PLT [id number]*   Basic Metabolic Panel: Recent Labs  Lab 07/03/19 0855  NA 137  K 4.3  CL 102  CO2 11*  GLUCOSE 247*  BUN 16  CREATININE 1.20  CALCIUM 10.0   GFR: Estimated Creatinine Clearance: 123.5 mL/min (by C-G formula based on SCr of 1.2 mg/dL). Liver Function Tests: Recent Labs  Lab 07/03/19 0855  AST 17  ALT 17  ALKPHOS 97  BILITOT 2.0*  PROT 9.5*  ALBUMIN 4.6   Recent Labs  Lab 07/03/19 0855  LIPASE 30   No results for input(s): AMMONIA in the last 168 hours. Coagulation Profile: No results for input(s): INR, PROTIME in the last 168 hours. Cardiac Enzymes: No results for  input(s): CKTOTAL, CKMB, CKMBINDEX, TROPONINI in the last 168 hours. BNP (last 3 results) No results for input(s): PROBNP in the last 8760 hours. HbA1C: No results for input(s): HGBA1C in the last 72 hours. CBG: No results for input(s): GLUCAP in the last 168 hours. Lipid Profile: No results for input(s): CHOL, HDL, LDLCALC, TRIG, CHOLHDL, LDLDIRECT in the last 72 hours. Thyroid Function Tests: No results for input(s): TSH, T4TOTAL, FREET4, T3FREE, THYROIDAB in the last 72 hours. Anemia Panel: No results for input(s): VITAMINB12, FOLATE, FERRITIN, TIBC, IRON, RETICCTPCT in the last 72 hours. Urine analysis:    Component Value Date/Time   COLORURINE YELLOW (A) 07/03/2019 0855   APPEARANCEUR CLEAR (A) 07/03/2019 0855   LABSPEC 1.035 (H) 07/03/2019 0855   PHURINE 6.0 07/03/2019 0855   GLUCOSEU >=500 (A) 07/03/2019 0855   HGBUR NEGATIVE 07/03/2019 [place] 07/03/2019 0855   BILIRUBINUR Neg 06/22/2015 1136   KETONESUR 80 (A) 07/03/2019 0855   PROTEINUR 100 (A) 07/03/2019 0855   UROBILINOGEN 0.2 06/22/2015 1136   NITRITE NEGATIVE 07/03/2019 0855   LEUKOCYTESUR NEGATIVE 07/03/2019 0855   Sepsis Labs: @LABRCNTIP (procalcitonin:4,lacticidven:4) )No results found for this or any previous visit (from the past 240 hour(s)).   Radiological Exams on Admission: DG Chest Portable 1 View  Result Date: 07/03/2019 CLINICAL DATA:  COVID-19 positive. EXAM: PORTABLE CHEST 1 VIEW COMPARISON:  September 07, 2014. FINDINGS: The heart size and mediastinal contours are within normal limits. Both lungs are clear. The visualized skeletal structures are unremarkable. IMPRESSION: No active disease. Electronically Signed   By: Marijo Conception M.D.   On: 07/03/2019 11:20     EKG: Independently reviewed.  Not done in ED, will get one.   Assessment/Plan Principal Problem:   DKA (diabetic ketoacidoses) ([facility]) Active Problems:   Hyperlipidemia, mixed   Diabetes mellitus  without complication  ([place])   Depression with anxiety   COVID-19 virus infection   Nausea vomiting and diarrhea   DKA (diabetic ketoacidoses) ([place]):  - Place in SDU for obs - total of 2L of NS bolus - start DKA protocol with BMP q4h - IVF: NS 125 cc/h; will switch to D5-1/2NS when CBG<250 - replete K as needed - Zofran prn nausea  - NPO   Hyperlipidemia, mixed: -crestor  Diabetes mellitus without complication ([place]): Last A1c 8.7 on 05/24/19, poorly controled. Patient is taking Metformin, glipizide, Trulicity, Farxiga at home.  Now has DKA -on DKA protocol  Depression with anxiety:  -continue home meds  Recent COVID-19 virus infection: Patient is asymptomatic. -observe closely  Nausea vomiting and diarrhea:  -check C diff PCR -check lipase -IVF -As needed Zofran for nausea    DVT ppx: SQ Lovenox Code Status: Full code Family Communication: None at bed side.     Disposition Plan:  Anticipate discharge back to previous home environment Consults called:  none Admission status:  SDU/obs      Date of Service 07/03/2019    [facility]   If 7PM-7AM, please contact night-coverage [email or web address] Password [email or web address] 07/03/2019, 2:22 PM

## 2019-07-03 NOTE — ED Notes (Signed)
Admitting MD at bedside.

## 2019-07-03 NOTE — ED Provider Notes (Signed)
[facility] Emergency Department Provider Note    First MD Initiated Contact with Patient 07/03/19 [phone]     (approximate)  I have reviewed the triage vital signs and the nursing notes.   HISTORY  Chief Complaint Emesis   HPI Raymond Rose is a 44 y.o. male the below listed past medical history presents the ER for evaluation of intractable nausea vomiting dehydration for the past 3 days.  Patient recently tested positive for COVID-19.  Was given ODT Zofran without any improvement.  States his mother died of COVID-30 last night.  He denies any chest pain or pressure.  Does have crampy abdominal pain.   States he has a history of cyclic vomiting syndrome.  Now unable to keep any of his medications down for the past 2 days.  Denies any cough or shortness of breath.   Past Medical History:  Diagnosis Date  . Anxiety   . Arthritis   . Depression   . Diabetes mellitus, type II ([place])   . OSA on CPAP   . Vertigo 01/03/2015   Family History  Problem Relation Age of Onset  . Depression Mother   . Heart attack Father 8  . Arthritis Other   . Alcohol abuse Other   . Lung cancer Other   . Hyperlipidemia Other   . CAD Other   . Stroke Other   . Hypertension Other   . Bipolar disorder Other    Past Surgical History:  Procedure Laterality Date  . 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 CHOLECYSTECTOMY  2010   lap Cholecystectomy  . 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 CT scan of Brain  12/23/2011   without contrast, [facility], Normal  . TONSILLECTOMY  1985   Patient Active Problem List   Diagnosis Date Noted  . DKA (diabetic ketoacidoses) ([place]) 07/03/2019  . Diabetes mellitus without complication ([place]) 0000000  . Depression with anxiety 07/03/2019  . COVID-19 virus infection 07/03/2019  . MDD (major depressive disorder), recurrent episode, moderate ([place])  04/21/2019  . GAD (generalized anxiety disorder) 04/21/2019  . Social anxiety disorder 04/21/2019  . Erectile dysfunction 10/03/2017  . Anxiety 08/08/2017  . Elevated transaminase level 08/28/2016  . Rectus diastasis 02/14/2016  . 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 Panic attacks 06/22/2015  . Depression 06/22/2015  . Diabetes mellitus ([place]) 01/03/2015  . Eating disorder 01/03/2015  . GERD (gastroesophageal reflux disease) 01/03/2015  . Hearing loss 01/03/2015  . Leg cramps 01/03/2015  . Low back pain 01/03/2015  . Adrenal mass, right ([place]) 06/18/2012  . Obstructive sleep apnea 07/08/2010  . Cluster headache 12/01/2008  . Fam hx-ischem heart disease 10/03/2008  . Morbid obesity ([place]) 09/30/2008  . Hyperlipidemia, mixed 12/12/2004      Prior to Admission medications   Medication Sig Start Date End Date Taking? Authorizing Provider  ALPRAZolam Duanne Moron) 0.5 MG tablet Take 1 tablet (0.5 mg total) by mouth every 4 (four) hours as needed. For anxiety 07/01/19   Birdie Sons, MD  dicyclomine (BENTYL) 20 MG tablet Take 1 tablet (20 mg total) by mouth every 6 (six) hours. For irritable bowel 06/22/19   Birdie Sons, MD  Dulaglutide (TRULICITY) [id number] 0000000 SOPN Inject 0.75 mg into the skin once a week. 04/08/19 07/07/19  Pollak, Fabio Bering M, PA-C  FARXIGA 10 MG TABS tablet TAKE 1 TABLET BY MOUTH DAILY  03/18/19   Birdie Sons, MD  fluconazole (DIFLUCAN) 150 MG tablet TAKE 1 TABLET BY MOUTH AS ONE DOSE 06/29/19   Birdie Sons, MD  glipiZIDE (GLUCOTROL XL) 10 MG 24 hr tablet TAKE 1 TABLET BY MOUTH DAILY WITH BREAKFAST. 09/03/18   Birdie Sons, MD  glucose blood [facility] VERIO) test strip 1 strip daily. Reported on 10/20/2015 06/30/13   [provider]  ketoconazole (NIZORAL) 2 % cream APPLY TO AFFECTED AREA ONCE DAILY FOR 7 DAYS AS NEEDED 03/21/19   Birdie Sons, MD  lamoTRIgine (LAMICTAL) 100 MG tablet Take 1 tablet (100 mg total) by mouth daily. For mood 06/23/19    Ursula Alert, MD  lamoTRIgine (LAMICTAL) 25 MG tablet Take 3 tablets (75 mg total) by mouth daily. Dose change- for 2 weeks and stop 06/23/19   Ursula Alert, MD  metFORMIN (GLUCOPHAGE-XR) 500 MG 24 hr tablet TAKE 2 TABLETS BY MOUTH TWICE A DAY 11/12/18   Birdie Sons, MD  omeprazole (PRILOSEC) 40 MG capsule TAKE 1 CAPSULE (40 MG TOTAL) BY MOUTH DAILY. 12/11/18   Birdie Sons, MD  ondansetron (ZOFRAN) 4 MG tablet Take 1 tablet (4 mg total) by mouth every 8 (eight) hours as needed for nausea or vomiting. 09/18/17   Birdie Sons, MD  oxyCODONE-acetaminophen (PERCOCET) 7.5-325 MG tablet Take 1 tablet by mouth every 4 (four) hours as needed for severe pain. 06/28/19   Birdie Sons, MD  predniSONE (DELTASONE) 10 MG tablet 6 TABLETS FOR 2 DAYS, THEN 5 FOR 2 DAYS, THEN 4 FOR 2 DAYS, THEN 3 FOR 2 DAYS, THEN 2 FOR 2 DAYS, THEN 1 FOR 2 DAYS. 06/29/19   Birdie Sons, MD  promethazine (PHENERGAN) 25 MG tablet Take 1 tablet (25 mg total) by mouth every 6 (six) hours as needed for nausea or vomiting. 07/01/19   Birdie Sons, MD  QUEtiapine (SEROQUEL) 50 MG tablet Take 1 tablet (50 mg total) by mouth at bedtime. 05/31/19   Ursula Alert, MD  rosuvastatin (CRESTOR) 20 MG tablet Take 1 tablet (20 mg total) by mouth daily. 05/31/19   Birdie Sons, MD  sildenafil (VIAGRA) 50 MG tablet TAKE ONE OR TWO TABLETS AS NEEDED, NO MORE THAN 2 IN A DAY INSURANCE COVERS 6 TAB IN 25 DAYS 02/25/19   Birdie Sons, MD  Vilazodone HCl (VIIBRYD) 40 MG TABS Take 1 tablet (40 mg total) by mouth daily. 05/31/19   Ursula Alert, MD    Allergies Bupropion and Morphine    Social History Social History   Tobacco Use  . Smoking status: Former Smoker    Years: 5.00    Quit date: 06/17/1997    Years since quitting: 22.0  . Smokeless tobacco: Never Used  . Tobacco comment: started smoking at ahe 16, and quit at age 56  Substance Use Topics  . Alcohol use: Yes    Alcohol/week: 0.0 standard drinks     Comment: occasional alcohol use  . Drug use: No    Review of Systems Patient denies headaches, rhinorrhea, blurry vision, numbness, shortness of breath, chest pain, edema, cough, abdominal pain, nausea, vomiting, diarrhea, dysuria, fevers, rashes or hallucinations unless otherwise stated above in HPI. ____________________________________________   PHYSICAL EXAM:  VITAL SIGNS: Vitals:   07/03/19 0847 07/03/19 1000  BP: (!) 159/101 137/82  Pulse: (!) 129 (!) 109  Resp: 16   Temp: 98.1 F (36.7 C)   SpO2: 100% 100%    Constitutional: Alert and oriented.  Eyes: Conjunctivae are normal.  Head: Atraumatic. Nose: No congestion/rhinnorhea. Mouth/Throat: Mucous membranes are moist.   Neck: No stridor. Painless ROM.  Cardiovascular: Normal rate, regular rhythm. Grossly normal heart sounds.  Good peripheral circulation. Respiratory: Normal respiratory effort.  No retractions. Lungs CTAB. Gastrointestinal: Soft and nontender. No distention. No abdominal bruits. No CVA tenderness. Genitourinary:  Musculoskeletal: No lower extremity tenderness nor edema.  No joint effusions. Neurologic:  Normal speech and language. No gross focal neurologic deficits are appreciated. No facial droop Skin:  Skin is warm, dry and intact. No rash noted. Psychiatric: Mood and affect are normal. Speech and behavior are normal.  ____________________________________________   LABS (all labs ordered are listed, but only abnormal results are displayed)  Results for orders placed or performed during the hospital encounter of 07/03/19 (from the past 24 hour(s))  Lipase, blood     Status: None   Collection Time: 07/03/19  8:55 AM  Result Value Ref Range   Lipase 30 11 - 51 U/L  Comprehensive metabolic panel     Status: Abnormal   Collection Time: 07/03/19  8:55 AM  Result Value Ref Range   Sodium 137 135 - 145 mmol/L   Potassium 4.3 3.5 - 5.1 mmol/L   Chloride 102 98 - 111 mmol/L   CO2 11 (L) 22 - 32 mmol/L    Glucose, Bld 247 (H) 70 - 99 mg/dL   BUN 16 6 - 20 mg/dL   Creatinine, Ser 1.20 0.61 - 1.24 mg/dL   Calcium 10.0 8.9 - 10.3 mg/dL   Total Protein 9.5 (H) 6.5 - 8.1 g/dL   Albumin 4.6 3.5 - 5.0 g/dL   AST 17 15 - 41 U/L   ALT 17 0 - 44 U/L   Alkaline Phosphatase 97 38 - 126 U/L   Total Bilirubin 2.0 (H) 0.3 - 1.2 mg/dL   GFR calc non Af Amer >60 >60 mL/min   GFR calc Af Amer >60 >60 mL/min   Anion gap 24 (H) 5 - 15  CBC     Status: Abnormal   Collection Time: 07/03/19  8:55 AM  Result Value Ref Range   WBC 12.9 (H) 4.0 - 10.5 K/uL   RBC 5.90 (H) 4.22 - 5.81 MIL/uL   Hemoglobin 16.3 13.0 - 17.0 g/dL   HCT 50.2 39.0 - 52.0 %   MCV 85.1 80.0 - 100.0 fL   MCH 27.6 26.0 - 34.0 pg   MCHC 32.5 30.0 - 36.0 g/dL   RDW 15.2 11.5 - 15.5 %   Platelets 427 (H) 150 - 400 K/uL   nRBC 0.0 0.0 - 0.2 %  Urinalysis, Complete w Microscopic     Status: Abnormal   Collection Time: 07/03/19  8:55 AM  Result Value Ref Range   Color, Urine YELLOW (A) YELLOW   APPearance CLEAR (A) CLEAR   Specific Gravity, Urine 1.035 (H) 1.005 - 1.030   pH 6.0 5.0 - 8.0   Glucose, UA >=500 (A) NEGATIVE mg/dL   Hgb urine dipstick NEGATIVE NEGATIVE   Bilirubin Urine NEGATIVE NEGATIVE   Ketones, ur 80 (A) NEGATIVE mg/dL   Protein, ur 100 (A) NEGATIVE mg/dL   Nitrite NEGATIVE NEGATIVE   Leukocytes,Ua NEGATIVE NEGATIVE   RBC / HPF 0-5 0 - 5 RBC/hpf   WBC, UA 0-5 0 - 5 WBC/hpf   Bacteria, UA NONE SEEN NONE SEEN   Squamous Epithelial / LPF 0-5 0 - 5   Mucus PRESENT    Hyaline Casts, UA PRESENT  Blood gas, venous     Status: Abnormal   Collection Time: 07/03/19 10:11 AM  Result Value Ref Range   pH, Ven 7.20 (L) 7.250 - 7.430   pCO2, Ven 42 (L) 44.0 - 60.0 mmHg   Bicarbonate 16.4 (L) 20.0 - 28.0 mmol/L   Acid-base deficit 11.2 (H) 0.0 - 2.0 mmol/L   Patient temperature 37.0    Collection site VEIN    Sample type VEIN   Lactic acid, plasma     Status: None   Collection Time: 07/03/19 10:22 AM  Result Value  Ref Range   Lactic Acid, Venous  0.5 - 1.9 mmol/L    SPECIMEN HEMOLYZED. HEMOLYSIS MAY AFFECT INTEGRITY OF RESULTS.   ____________________________________________ ____________________________________________  RADIOLOGY  I personally reviewed all radiographic images ordered to evaluate for the above acute complaints and reviewed radiology reports and findings.  These findings were personally discussed with the patient.  Please see medical record for radiology report.  ____________________________________________   PROCEDURES  Procedure(s) performed:  .Critical Care Performed by: Merlyn Lot, MD Authorized by: Merlyn Lot, MD   Critical care provider statement:    Critical care time (minutes):  30   Critical care time was exclusive of:  Separately billable procedures and treating other patients   Critical care was necessary to treat or prevent imminent or life-threatening deterioration of the following conditions:  Metabolic crisis   Critical care was time spent personally by me on the following activities:  Development of treatment plan with patient or surrogate, discussions with consultants, evaluation of patient's response to treatment, examination of patient, obtaining history from patient or surrogate, ordering and performing treatments and interventions, ordering and review of laboratory studies, ordering and review of radiographic studies, pulse oximetry, re-evaluation of patient's condition and review of old charts      Critical Care performed: yes ____________________________________________   INITIAL IMPRESSION / ASSESSMENT AND PLAN / ED COURSE  Pertinent labs & imaging results that were available during my care of the patient were reviewed by me and considered in my medical decision making (see chart for details).   DDX: dehydration, cyclic vomiting syndrome, enteritis, dka, gastritis, covid 45  Raymond Rose is a 44 y.o. who presents to the ED with  symptoms as described above.  Patient tachycardic clinically dehydrated and blood work showing significant metabolic acidosis.  Also has significant ketosis.  Will give IV fluids as well as IV insulin and antiemetics.  He is borderline DKA. This may simply be starvation ketosis however. Given his acute metabolic derangements will require hospitalization for further medical management.  Have discussed with the patient and available family all diagnostics and treatments performed thus far and all questions were answered to the best of my ability. The patient demonstrates understanding and agreement with plan.      The patient was evaluated in Emergency Department today for the symptoms described in the history of present illness. He/she was evaluated in the context of the global COVID-19 pandemic, which necessitated consideration that the patient might be at risk for infection with the SARS-CoV-2 virus that causes COVID-19. Institutional protocols and algorithms that pertain to the evaluation of patients at risk for COVID-19 are in a state of rapid change based on information released by regulatory bodies including the CDC and federal and state organizations. These policies and algorithms were followed during the patient's care in the ED.  As part of my medical decision making, I reviewed the following data within the [facility]  Nursing notes reviewed and incorporated, Labs reviewed, notes from prior ED visits and [place] Controlled Substance Database   ____________________________________________   FINAL CLINICAL IMPRESSION(S) / ED DIAGNOSES  Final diagnoses:  High anion gap metabolic acidosis  Intractable nausea and vomiting      NEW MEDICATIONS STARTED DURING THIS VISIT:  New Prescriptions   No medications on file     Note:  This document was prepared using Dragon voice recognition software and may include unintentional dictation errors.    Merlyn Lot, MD 07/03/19  1225

## 2019-07-03 NOTE — ED Notes (Signed)
Pt ambulatory to toilet to urinate.

## 2019-07-04 DIAGNOSIS — Z801 Family history of malignant neoplasm of trachea, bronchus and lung: Secondary | ICD-10-CM | POA: Diagnosis not present

## 2019-07-04 DIAGNOSIS — Z8249 Family history of ischemic heart disease and other diseases of the circulatory system: Secondary | ICD-10-CM | POA: Diagnosis not present

## 2019-07-04 DIAGNOSIS — G4733 Obstructive sleep apnea (adult) (pediatric): Secondary | ICD-10-CM | POA: Diagnosis present

## 2019-07-04 DIAGNOSIS — F418 Other specified anxiety disorders: Secondary | ICD-10-CM | POA: Diagnosis not present

## 2019-07-04 DIAGNOSIS — E1110 Type 2 diabetes mellitus with ketoacidosis without coma: Principal | ICD-10-CM

## 2019-07-04 DIAGNOSIS — Z818 Family history of other mental and behavioral disorders: Secondary | ICD-10-CM | POA: Diagnosis not present

## 2019-07-04 DIAGNOSIS — Z6835 Body mass index (BMI) 35.0-35.9, adult: Secondary | ICD-10-CM | POA: Diagnosis not present

## 2019-07-04 DIAGNOSIS — Z87891 Personal history of nicotine dependence: Secondary | ICD-10-CM | POA: Diagnosis not present

## 2019-07-04 DIAGNOSIS — F329 Major depressive disorder, single episode, unspecified: Secondary | ICD-10-CM | POA: Diagnosis present

## 2019-07-04 DIAGNOSIS — Z7984 Long term (current) use of oral hypoglycemic drugs: Secondary | ICD-10-CM | POA: Diagnosis not present

## 2019-07-04 DIAGNOSIS — U071 COVID-19: Secondary | ICD-10-CM

## 2019-07-04 DIAGNOSIS — Z79899 Other long term (current) drug therapy: Secondary | ICD-10-CM | POA: Diagnosis not present

## 2019-07-04 DIAGNOSIS — R197 Diarrhea, unspecified: Secondary | ICD-10-CM | POA: Diagnosis present

## 2019-07-04 DIAGNOSIS — E782 Mixed hyperlipidemia: Secondary | ICD-10-CM | POA: Diagnosis present

## 2019-07-04 DIAGNOSIS — E860 Dehydration: Secondary | ICD-10-CM | POA: Diagnosis present

## 2019-07-04 DIAGNOSIS — K219 Gastro-esophageal reflux disease without esophagitis: Secondary | ICD-10-CM | POA: Diagnosis present

## 2019-07-04 DIAGNOSIS — F411 Generalized anxiety disorder: Secondary | ICD-10-CM | POA: Diagnosis present

## 2019-07-04 DIAGNOSIS — E119 Type 2 diabetes mellitus without complications: Secondary | ICD-10-CM | POA: Diagnosis not present

## 2019-07-04 DIAGNOSIS — Z823 Family history of stroke: Secondary | ICD-10-CM | POA: Diagnosis not present

## 2019-07-04 LAB — BETA-HYDROXYBUTYRIC ACID
Beta-Hydroxybutyric Acid: 3.69 mmol/L — ABNORMAL HIGH (ref 0.05–0.27)
Beta-Hydroxybutyric Acid: 2.52 mmol/L — ABNORMAL HIGH (ref 0.05–0.27)

## 2019-07-04 LAB — GLUCOSE, CAPILLARY
Glucose-Capillary: 137 mg/dL — ABNORMAL HIGH (ref 70–99)
Glucose-Capillary: 139 mg/dL — ABNORMAL HIGH (ref 70–99)
Glucose-Capillary: 140 mg/dL — ABNORMAL HIGH (ref 70–99)
Glucose-Capillary: 146 mg/dL — ABNORMAL HIGH (ref 70–99)
Glucose-Capillary: 149 mg/dL — ABNORMAL HIGH (ref 70–99)
Glucose-Capillary: 150 mg/dL — ABNORMAL HIGH (ref 70–99)
Glucose-Capillary: 150 mg/dL — ABNORMAL HIGH (ref 70–99)
Glucose-Capillary: 154 mg/dL — ABNORMAL HIGH (ref 70–99)
Glucose-Capillary: 157 mg/dL — ABNORMAL HIGH (ref 70–99)
Glucose-Capillary: 160 mg/dL — ABNORMAL HIGH (ref 70–99)
Glucose-Capillary: 167 mg/dL — ABNORMAL HIGH (ref 70–99)
Glucose-Capillary: 174 mg/dL — ABNORMAL HIGH (ref 70–99)
Glucose-Capillary: 187 mg/dL — ABNORMAL HIGH (ref 70–99)
Glucose-Capillary: 202 mg/dL — ABNORMAL HIGH (ref 70–99)

## 2019-07-04 LAB — PHOSPHORUS: Phosphorus: 1.7 mg/dL — ABNORMAL LOW (ref 2.5–4.6)

## 2019-07-04 LAB — BASIC METABOLIC PANEL
Anion gap: 14 (ref 5–15)
Anion gap: 13 (ref 5–15)
Anion gap: 11 (ref 5–15)
BUN: 13 mg/dL (ref 6–20)
BUN: 12 mg/dL (ref 6–20)
BUN: 12 mg/dL (ref 6–20)
CO2: 16 mmol/L — ABNORMAL LOW (ref 22–32)
CO2: 16 mmol/L — ABNORMAL LOW (ref 22–32)
CO2: 19 mmol/L — ABNORMAL LOW (ref 22–32)
Calcium: 8.8 mg/dL — ABNORMAL LOW (ref 8.9–10.3)
Calcium: 8.7 mg/dL — ABNORMAL LOW (ref 8.9–10.3)
Calcium: 8.7 mg/dL — ABNORMAL LOW (ref 8.9–10.3)
Chloride: 108 mmol/L (ref 98–111)
Chloride: 109 mmol/L (ref 98–111)
Chloride: 109 mmol/L (ref 98–111)
Creatinine, Ser: 0.86 mg/dL (ref 0.61–1.24)
Creatinine, Ser: 0.79 mg/dL (ref 0.61–1.24)
Creatinine, Ser: 0.88 mg/dL (ref 0.61–1.24)
GFR calc Af Amer: >60 mL/min (ref >60)
GFR calc Af Amer: >60 mL/min (ref >60)
GFR calc Af Amer: >60 mL/min (ref >60)
GFR calc non Af Amer: >60 mL/min (ref >60)
GFR calc non Af Amer: >60 mL/min (ref >60)
GFR calc non Af Amer: >60 mL/min (ref >60)
Glucose, Bld: 155 mg/dL — ABNORMAL HIGH (ref 70–99)
Glucose, Bld: 147 mg/dL — ABNORMAL HIGH (ref 70–99)
Glucose, Bld: 148 mg/dL — ABNORMAL HIGH (ref 70–99)
Potassium: 3.7 mmol/L (ref 3.5–5.1)
Potassium: 3.2 mmol/L — ABNORMAL LOW (ref 3.5–5.1)
Potassium: 3.3 mmol/L — ABNORMAL LOW (ref 3.5–5.1)
Sodium: 138 mmol/L (ref 135–145)
Sodium: 138 mmol/L (ref 135–145)
Sodium: 139 mmol/L (ref 135–145)

## 2019-07-04 LAB — HIV ANTIBODY (ROUTINE TESTING W REFLEX): HIV Screen 4th Generation wRfx: NONREACTIVE

## 2019-07-04 LAB — MRSA PCR SCREENING: MRSA by PCR: NEGATIVE

## 2019-07-04 LAB — MAGNESIUM: Magnesium: 1.9 mg/dL (ref 1.7–2.4)

## 2019-07-04 MED ORDER — CHLORHEXIDINE GLUCONATE CLOTH 2 % EX PADS
6.0000 | MEDICATED_PAD | Freq: Every day | CUTANEOUS | Status: DC
  Administered 2019-07-04: 6 via TOPICAL

## 2019-07-04 MED ORDER — ENOXAPARIN SODIUM 60 MG/0.6ML ~~LOC~~ SOLN
60.0000 mg | Freq: Two times a day (BID) | SUBCUTANEOUS | Status: DC
  Administered 2019-07-04 (×2): 60 mg via SUBCUTANEOUS
  Filled 2019-07-04 (×4): qty 0.6

## 2019-07-04 MED ORDER — KETOROLAC TROMETHAMINE 30 MG/ML IJ SOLN
30.0000 mg | Freq: Once | INTRAMUSCULAR | Status: AC
  Administered 2019-07-05: 30 mg via INTRAVENOUS
  Filled 2019-07-04: qty 1

## 2019-07-04 MED ORDER — POTASSIUM CHLORIDE CRYS ER 20 MEQ PO TBCR: 40.0000 meq | EXTENDED_RELEASE_TABLET | Freq: Once | ORAL | Status: DC

## 2019-07-04 MED ORDER — INSULIN ASPART 100 UNIT/ML ~~LOC~~ SOLN
0.0000 [IU] | Freq: Three times a day (TID) | SUBCUTANEOUS | Status: DC
  Administered 2019-07-04 – 2019-07-05 (×3): 3 [IU] via SUBCUTANEOUS
  Filled 2019-07-04: qty 1

## 2019-07-04 MED ORDER — INSULIN ASPART 100 UNIT/ML ~~LOC~~ SOLN: 0.0000 [IU] | Freq: Every day | SUBCUTANEOUS | Status: DC

## 2019-07-04 MED ORDER — POTASSIUM PHOSPHATES 15 MMOLE/5ML IV SOLN
30.0000 mmol | Freq: Once | INTRAVENOUS | Status: AC
  Administered 2019-07-04: 09:00:00 30 mmol via INTRAVENOUS
  Filled 2019-07-04: qty 10

## 2019-07-04 MED ORDER — INSULIN DETEMIR 100 UNIT/ML ~~LOC~~ SOLN
20.0000 [IU] | Freq: Every day | SUBCUTANEOUS | Status: DC
  Administered 2019-07-04: 11:00:00 20 [IU] via SUBCUTANEOUS
  Filled 2019-07-04 (×2): qty 0.2

## 2019-07-04 MED ORDER — CLOTRIMAZOLE 1 % EX CREA
TOPICAL_CREAM | Freq: Two times a day (BID) | CUTANEOUS | Status: DC
  Filled 2019-07-04: qty 15

## 2019-07-04 NOTE — ED Notes (Signed)
Report discussed with  CCU charge RN Hiral. Will transport patient to CCU at this time.

## 2019-07-04 NOTE — Progress Notes (Signed)
PROGRESS NOTE    Raymond Rose  [id number] DOB: [birth year] DOA: 07/03/2019 PCP: Birdie Sons, MD    Brief Narrative:  Raymond Rose is a 44 y.o. male with medical history significant of diabetes, hyperlipidemia, depression with anxiety, OSA, who presents with nausea, vomiting, diarrhea.  Patient states that he was recently tested positive for COVID-19 on 06/24/19. Initially he had some fever, chills, cough, which have all resolved.  Patient does not have any shortness breath or chest pain today.  He states that he has been having nausea, vomiting, diarrhea in the past 3 days, which has worsened today.  He has multiple nonbloody nonbiliary vomitus each day.  He has several episode of watery diarrhea.  He has mild abdominal discomfort and cramps. No symptoms of UTI or unilateral weakness.  1/17: Patient seen and examined.  Stepdown status in the intensive care unit.  Remains on the insulin infusion.  Stable no distress.  Glucose is decreasing appropriately.  Anion gap closing.   Assessment & Plan:   Principal Problem:   DKA (diabetic ketoacidoses) ([place]) Active Problems:   Hyperlipidemia, mixed   Diabetes mellitus without complication ([place])   Depression with anxiety   COVID-19 virus infection   Nausea vomiting and diarrhea  DKA (diabetic ketoacidoses) ([place]): Patient has been on insulin drip overnight Gap closing Glycemic control improved Suspected trigger is Covid infection Patient reports compliance with outpatient diabetic regimen Plan: Continue Endo tool insulin GTT until anion gap closed Once gap closed will transition to subcutaneous regimen Advance diet as tolerated goal carb control Can DC fluids once patient tolerating p.o. and insulin infusion stopped Decrease frequency of BMP to daily   Hyperlipidemia, mixed: -crestor  Diabetes mellitus without complication ([place]):  Last A1c 8.7 on 05/24/19, poorly controled.  Patient is taking Metformin, glipizide,  Trulicity, Farxiga at home.   Now has DKA -on DKA protocol, see above  Depression with anxiety:  -continue home meds  Recent COVID-19 virus infection:  Patient is asymptomatic. -observe closely  Nausea vomiting and diarrhea:  Suspected secondary to DKA Resolved  DVT prophylaxis: Lovenox Code Status: Full Family Communication: None today Disposition Plan: Home, 24 hours  Consultants:   None   Procedures: None   Antimicrobials: None   Subjective: Patient seen and examined Remains in intensive care unit on stepdown status Anion gap closing Patient asymptomatic No new complaints  Objective: Vitals:   07/04/19 0900 07/04/19 1000 07/04/19 1100 07/04/19 1200  BP: 113/75 101/73 99/69 106/73  Pulse: 92 89 95 84  Resp: 20 14 10  (!) 21  Temp:    97.8 F (36.6 C)  TempSrc:    Oral  SpO2: 100% 99% 100% 98%  Weight:      Height:        Intake/Output Summary (Last 24 hours) at 07/04/2019 1537 Last data filed at 07/04/2019 0600 Gross per 24 hour  Intake 1507.13 ml  Output 1000 ml  Net 507.13 ml   Filed Weights   07/03/19 0845 07/04/19 0130  Weight: (!) 149.7 kg 121.4 kg    Examination:  General exam: Appears calm and comfortable  Respiratory system: Clear to auscultation. Respiratory effort normal. Cardiovascular system: S1 & S2 heard, RRR. No JVD, murmurs, rubs, gallops or clicks. No pedal edema. Gastrointestinal system: Abdomen is nondistended, soft and nontender. No organomegaly or masses felt. Normal bowel sounds heard. Central nervous system: Alert and oriented. No focal neurological deficits. Extremities: Symmetric 5 x 5 power. Skin: No rashes, lesions or ulcers Psychiatry:  Judgement and insight appear normal. Mood & affect appropriate.     Data Reviewed: I have personally reviewed following labs and imaging studies  CBC: Recent Labs  Lab 07/03/19 0855  WBC 12.9*  HGB 16.3  HCT 50.2  MCV 85.1  PLT [id number]*   Basic Metabolic Panel: Recent  Labs  Lab 07/03/19 1719 07/03/19 2053 07/04/19 0140 07/04/19 0546 07/04/19 0849  NA 140 138 138 138 139  K 4.0 3.6 3.7 3.2* 3.3*  CL 108 107 108 109 109  CO2 16* 16* 16* 16* 19*  GLUCOSE 161* 162* 155* 147* 148*  BUN 14 13 13 12 12   CREATININE 0.96 0.95 0.86 0.79 0.88  CALCIUM 9.0 8.9 8.8* 8.7* 8.7*  MG  --   --   --  1.9  --   PHOS  --   --   --  1.7*  --    GFR: Estimated Creatinine Clearance: 150.8 mL/min (by C-G formula based on SCr of 0.88 mg/dL). Liver Function Tests: Recent Labs  Lab 07/03/19 0855  AST 17  ALT 17  ALKPHOS 97  BILITOT 2.0*  PROT 9.5*  ALBUMIN 4.6   Recent Labs  Lab 07/03/19 0855  LIPASE 30   No results for input(s): AMMONIA in the last 168 hours. Coagulation Profile: No results for input(s): INR, PROTIME in the last 168 hours. Cardiac Enzymes: No results for input(s): CKTOTAL, CKMB, CKMBINDEX, TROPONINI in the last 168 hours. BNP (last 3 results) No results for input(s): PROBNP in the last 8760 hours. HbA1C: No results for input(s): HGBA1C in the last 72 hours. CBG: Recent Labs  Lab 07/04/19 0739 07/04/19 0836 07/04/19 1030 07/04/19 1150 07/04/19 1317  GLUCAP 149* 146* 160* 202* 167*   Lipid Profile: No results for input(s): CHOL, HDL, LDLCALC, TRIG, CHOLHDL, LDLDIRECT in the last 72 hours. Thyroid Function Tests: No results for input(s): TSH, T4TOTAL, FREET4, T3FREE, THYROIDAB in the last 72 hours. Anemia Panel: No results for input(s): VITAMINB12, FOLATE, FERRITIN, TIBC, IRON, RETICCTPCT in the last 72 hours. Sepsis Labs: Recent Labs  Lab 07/03/19 1022 07/03/19 1303  LATICACIDVEN SPECIMEN HEMOLYZED. HEMOLYSIS MAY AFFECT INTEGRITY OF RESULTS. 1.8    Recent Results (from the past 240 hour(s))  Respiratory Panel by RT PCR (Flu A&B, Covid) - Nasopharyngeal Swab     Status: Abnormal   Collection Time: 07/03/19  5:11 PM   Specimen: Nasopharyngeal Swab  Result Value Ref Range Status   SARS Coronavirus 2 by RT PCR POSITIVE (A)  NEGATIVE Final    Comment: RESULT CALLED TO, READ BACK BY AND VERIFIED WITH: LORRIE LEMONS @ 1826 ON 07/03/2019 BY CAF (NOTE) SARS-CoV-2 target nucleic acids are DETECTED. SARS-CoV-2 RNA is generally detectable in upper respiratory specimens  during the acute phase of infection. Positive results are indicative of the presence of the identified virus, but do not rule out bacterial infection or co-infection with other pathogens not detected by the test. Clinical correlation with patient history and other diagnostic information is necessary to determine patient infection status. The expected result is Negative. Fact Sheet for Patients:  PinkCheek.be Fact Sheet for Healthcare Providers: GravelBags.it This test is not yet approved or cleared by the [place] FDA and  has been authorized for detection and/or diagnosis of SARS-CoV-2 by FDA under an Emergency Use Authorization (EUA).  This EUA will remain in effect (meaning this test can be [place] ed) for the duration of  the COVID-19 declaration under Section 564(b)(1) of the Act, 21 U.S.C. section 360bbb-3(b)(1), unless the  authorization is terminated or revoked sooner.    Influenza A by PCR NEGATIVE NEGATIVE Final   Influenza B by PCR NEGATIVE NEGATIVE Final    Comment: (NOTE) The Xpert Xpress SARS-CoV-2/FLU/RSV assay is intended as an aid in  the diagnosis of influenza from Nasopharyngeal swab specimens and  should not be used as a sole basis for treatment. Nasal washings and  aspirates are unacceptable for Xpert Xpress SARS-CoV-2/FLU/RSV  testing. Fact Sheet for Patients: PinkCheek.be Fact Sheet for Healthcare Providers: GravelBags.it This test is not yet approved or cleared by the [place] FDA and  has been authorized for detection and/or diagnosis of SARS-CoV-2 by  FDA under an Emergency Use Authorization (EUA).  This EUA will remain  in effect (meaning this test can be used) for the duration of the  Covid-19 declaration under Section 564(b)(1) of the Act, 21  U.S.C. section 360bbb-3(b)(1), unless the authorization is  terminated or revoked. Performed at [facility], [address] [place], [place] [address]   MRSA PCR Screening     Status: None   Collection Time: 07/04/19  1:32 AM   Specimen: Nasopharyngeal  Result Value Ref Range Status   MRSA by PCR NEGATIVE NEGATIVE Final    Comment:        The GeneXpert MRSA Assay (FDA approved for NASAL specimens only), is one component of a comprehensive MRSA colonization surveillance program. It is not intended to diagnose MRSA infection nor to guide or monitor treatment for MRSA infections. Performed at [facility], [address] [place], [place] [address]          Radiology Studies: DG Chest Portable 1 View  Result Date: 07/03/2019 CLINICAL DATA:  COVID-19 positive. EXAM: PORTABLE CHEST 1 VIEW COMPARISON:  September 07, 2014. FINDINGS: The heart size and mediastinal contours are within normal limits. Both lungs are clear. The visualized skeletal structures are unremarkable. IMPRESSION: No active disease. Electronically Signed   By: Marijo Conception M.D.   On: 07/03/2019 11:20        Scheduled Meds: . Chlorhexidine Gluconate Cloth  6 each Topical Q0600  . clotrimazole   Topical BID  . dicyclomine  20 mg Oral Q6H  . enoxaparin (LOVENOX) injection  60 mg Subcutaneous Q12H  . insulin aspart  0-15 Units Subcutaneous TID WC  . insulin aspart  0-5 Units Subcutaneous QHS  . insulin detemir  20 Units Subcutaneous Daily  . lamoTRIgine  100 mg Oral Daily  . pantoprazole  40 mg Oral Daily  . QUEtiapine  50 mg Oral QHS  . rosuvastatin  20 mg Oral Daily  . Vilazodone HCl  40 mg Oral Daily   Continuous Infusions: . sodium chloride Stopped (07/03/19 2316)  . dextrose 5 % and 0.45% NaCl 125 mL/hr at 07/04/19 0637  .  insulin 3.8 mL/hr at 07/04/19 0600     LOS: 0 days    Time spent: 35 minutes    Sidney Ace, MD [facility] Pager [phone]  If 7PM-7AM, please contact night-coverage [email or web address] Password [facility] 07/04/2019, 3:37 PM

## 2019-07-04 NOTE — ED Notes (Signed)
All oral medications held due to patient nausea.

## 2019-07-04 NOTE — ED Notes (Signed)
Contacted patients wife and provided an update on his condition and informed her of his move to the ICU.

## 2019-07-04 NOTE — Progress Notes (Signed)
Inpatient Diabetes Program Recommendations  AACE/ADA: New Consensus Statement on Inpatient Glycemic Control (2015)  Target Ranges:  Prepandial:   less than 140 mg/dL      Peak postprandial:   less than 180 mg/dL (1-2 hours)      Critically ill patients:  140 - 180 mg/dL   Lab Results  Component Value Date   GLUCAP 167 (H) 07/04/2019   HGBA1C 8.7 (H) 05/24/2019    Review of Glycemic Control  Diabetes history: DM2 Outpatient Diabetes medications: Trulicity 1.5 mg weekly, Farxiga 10 mg QD, metformin 1000 mg bid, glipizide 10 mg QAM Current orders for Inpatient glycemic control: Levemir 20 units QD, Novolog 0-15 units tidwc and 0-5 units QHS  AG - 11    CO2 - 19 Transitioned to Lantus/Novolog HgbA1C - 8.1% - improved from 06/08/18 9.3% Has lost 41 kg since 02/17/19! Covid 19+  Asymptomatic Will likely need meal coverage insulin when eating CHO mod diet. Has been on CLs.   Inpatient Diabetes Program Recommendations:     Add Novolog 4 units tidwc for meal coverage insulin if pt eats > 50% meal.  Spoke with pt regarding his diabetes control at home. Pt states he sees PCP every 3 months and HgbA1C has decreased from over 13% at one point. States he and wife were planning to exercise regularly and try to eat more healthy with portion control for the new year. Discussed HgbA1C of 8.7% and goal of approx 7%. Pt feels he can do this with diet and exercise. Said he checks blood sugars "some" but needs to do it daily. Encouraged him to take logbook to PCP for any needed medication adjustments. Answered questions.  Continue to follow.   Thank you. Lorenda Peck, RD, LDN, CDE Inpatient Diabetes Coordinator [phone]

## 2019-07-04 NOTE — Progress Notes (Signed)
Pharmacy Electrolyte Monitoring Consult:  Pharmacy consulted to assist in monitoring and replacing electrolytes in this 44 y.o. male admitted on 07/03/2019 with DKA. Patient tested positive for COVID-19 on 06/24/19.  Labs:  Sodium (mmol/L)  Date Value  07/04/2019 138  05/24/2019 140  09/07/2014 139  09/07/2014 139   Potassium (mmol/L)  Date Value  07/04/2019 3.2 (L)  09/07/2014 4.0  09/07/2014 4.0   Magnesium (mg/dL)  Date Value  07/04/2019 1.9   Phosphorus (mg/dL)  Date Value  07/04/2019 1.7 (L)   Calcium (mg/dL)  Date Value  07/04/2019 8.7 (L)   Calcium, Total (mg/dL)  Date Value  09/07/2014 9.2  09/07/2014 9.2   Albumin (g/dL)  Date Value  07/03/2019 4.6  05/24/2019 4.5  09/07/2014 4.1    Assessment/Plan: Patient is receiving continuous insulin infusion and MIVF D5-1/2NS at 125 mL/hr.   Due to phosphorus < 2, will order potassium phosphate 30 mmol IV x 1.   Next scheduled BMP is 0900.   Will replace to maintain electrolytes within normal limits and potassium ~4 while patient is on insulin infusion.   Pharmacy will continue to monitor and adjust per consult.   Tregan Read L 07/04/2019 7:54 AM

## 2019-07-04 NOTE — Progress Notes (Signed)
PHARMACY NOTE:  ANTICOAGULATION RENAL DOSAGE ADJUSTMENT  Current anticoagulation regimen includes a mismatch between anticoagulation dosage and estimated renal function.  As per policy approved by the Pharmacy & Therapeutics and Medical Executive Committees, the anticoagulation dosage will be adjusted accordingly.  Current anticoagulation dosage:  lovenox 40 mg bid  Indication: vte prophylaxis  Renal Function:  Estimated Creatinine Clearance: 154.3 mL/min (by C-G formula based on SCr of 0.86 mg/dL). []      On intermittent HD, scheduled: []      On CRRT    Anticoagulation dosage has been changed to:  lovenox 60 mg subq bid (0.5 mg/kg bid because patient is > 35 BMI and COVID positive)  Additional comments:   Thank you for allowing pharmacy to be a part of this patient's care.  Tobie Lords, PharmD, BCPS Clinical Pharmacist 07/04/2019 5:08 AM

## 2019-07-04 NOTE — ED Notes (Addendum)
ED TO INPATIENT HANDOFF REPORT  ED Nurse Name and Phone #:   Gershon Mussel RN   [phone]  S Name/Age/Gender Raymond Rose 44 y.o. male Room/Bed: EDOTFA/EDOTF  Code Status   Code Status: Full Code  Home/SNF/Other Home Patient oriented to: self, place, time and situation Is this baseline? Yes   Triage Complete: Triage complete  Chief Complaint DKA (diabetic ketoacidoses) ([place]) [E11.10]  Triage Note Pt to ED via POV for vomiting for 3-4 days. Pt is not able to keep anything down. Pt is COVID positive, tested positive on 01/7. Pt is in NAD at this time.     Allergies Allergies  Allergen Reactions  . Bupropion     Extreme anxiety  . Morphine     Level of Care/Admitting Diagnosis ED Disposition    ED Disposition Condition [place]: [place] [100120]  Level of Care: Stepdown [14]  Covid Evaluation: Symptomatic Person Under Investigation (PUI)  Diagnosis: [facility] (diabetic ketoacidoses) [id number]  Admitting Physician: Ivor Costa [place]  Attending Physician: Ivor Costa [[id number]]       B Medical/Surgery History Past Medical History:  Diagnosis Date  . Anxiety   . Arthritis   . Depression   . Diabetes mellitus, type II ([place])   . OSA on CPAP   . Vertigo 01/03/2015   Past Surgical History:  Procedure Laterality Date  . 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 CHOLECYSTECTOMY  2010   lap Cholecystectomy  . 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 CT scan of Brain  12/23/2011   without contrast, [facility], Normal  . TONSILLECTOMY  1985     A IV Location/Drains/Wounds Patient Lines/Drains/Airways Status   Active Line/Drains/Airways    Name:   Placement date:   Placement time:   Site:   Days:   Peripheral IV 09/23/17 Anterior;Distal;Left;Upper Arm   09/23/17    1032    Arm   649   Peripheral IV 07/03/19 Left Antecubital   07/03/19    0904     Antecubital   1   Peripheral IV 07/03/19 Right Antecubital   07/03/19    2056    Antecubital   1          Intake/Output Last 24 hours  Intake/Output Summary (Last 24 hours) at 07/04/2019 0110 Last data filed at 07/04/2019 0007 Gross per 24 hour  Intake 3000 ml  Output --  Net 3000 ml    Labs/Imaging Results for orders placed or performed during the hospital encounter of 07/03/19 (from the past 48 hour(s))  Lipase, blood     Status: None   Collection Time: 07/03/19  8:55 AM  Result Value Ref Range   Lipase 30 11 - 51 U/L    Comment: Performed at [facility], [address] [place], [place] [address]  Comprehensive metabolic panel     Status: Abnormal   Collection Time: 07/03/19  8:55 AM  Result Value Ref Range   Sodium 137 135 - 145 mmol/L    Comment: LYTES REPEATED / JAG   Potassium 4.3 3.5 - 5.1 mmol/L   Chloride 102 98 - 111 mmol/L   CO2 11 (L) 22 - 32 mmol/L   Glucose, Bld 247 (H) 70 - 99 mg/dL   BUN 16 6 - 20 mg/dL   Creatinine, Ser 1.20 0.61 - 1.24 mg/dL  Calcium 10.0 8.9 - 10.3 mg/dL   Total Protein 9.5 (H) 6.5 - 8.1 g/dL   Albumin 4.6 3.5 - 5.0 g/dL   AST 17 15 - 41 U/L   ALT 17 0 - 44 U/L   Alkaline Phosphatase 97 38 - 126 U/L   Total Bilirubin 2.0 (H) 0.3 - 1.2 mg/dL   GFR calc non Af Amer >60 >60 mL/min   GFR calc Af Amer >60 >60 mL/min   Anion gap 24 (H) 5 - 15    Comment: Performed at [facility], [address] [place], [place] [address]  CBC     Status: Abnormal   Collection Time: 07/03/19  8:55 AM  Result Value Ref Range   WBC 12.9 (H) 4.0 - 10.5 K/uL   RBC 5.90 (H) 4.22 - 5.81 MIL/uL   Hemoglobin 16.3 13.0 - 17.0 g/dL   HCT 50.2 39.0 - 52.0 %   MCV 85.1 80.0 - 100.0 fL   MCH 27.6 26.0 - 34.0 pg   MCHC 32.5 30.0 - 36.0 g/dL   RDW 15.2 11.5 - 15.5 %   Platelets 427 (H) 150 - 400 K/uL   nRBC 0.0 0.0 - 0.2 %    Comment: Performed at [facility], [address] [place], [place] [address]  Urinalysis, Complete w  Microscopic     Status: Abnormal   Collection Time: 07/03/19  8:55 AM  Result Value Ref Range   Color, Urine YELLOW (A) YELLOW   APPearance CLEAR (A) CLEAR   Specific Gravity, Urine 1.035 (H) 1.005 - 1.030   pH 6.0 5.0 - 8.0   Glucose, UA >=500 (A) NEGATIVE mg/dL   Hgb urine dipstick NEGATIVE NEGATIVE   Bilirubin Urine NEGATIVE NEGATIVE   Ketones, ur 80 (A) NEGATIVE mg/dL   Protein, ur 100 (A) NEGATIVE mg/dL   Nitrite NEGATIVE NEGATIVE   Leukocytes,Ua NEGATIVE NEGATIVE   RBC / HPF 0-5 0 - 5 RBC/hpf   WBC, UA 0-5 0 - 5 WBC/hpf   Bacteria, UA NONE SEEN NONE SEEN   Squamous Epithelial / LPF 0-5 0 - 5   Mucus PRESENT    Hyaline Casts, UA PRESENT     Comment: Performed at [facility], [address] [place], [place] [address]  Blood gas, venous     Status: Abnormal   Collection Time: 07/03/19 10:11 AM  Result Value Ref Range   pH, Ven 7.20 (L) 7.250 - 7.430   pCO2, Ven 42 (L) 44.0 - 60.0 mmHg   Bicarbonate 16.4 (L) 20.0 - 28.0 mmol/L   Acid-base deficit 11.2 (H) 0.0 - 2.0 mmol/L   Patient temperature 37.0    Collection site VEIN    Sample type VEIN     Comment: Performed at [facility], [address] [place], [place] [address]  Lactic acid, plasma     Status: None   Collection Time: 07/03/19 10:22 AM  Result Value Ref Range   Lactic Acid, Venous  0.5 - 1.9 mmol/L    SPECIMEN HEMOLYZED. HEMOLYSIS MAY AFFECT INTEGRITY OF RESULTS.    Comment: C/ Nadyne Coombes ON 07/03/2019  AT 1056 TIK Performed at [facility], [address] [place],  [address]   Lactic acid, plasma     Status: None   Collection Time: 07/03/19  1:03 PM  Result Value Ref Range   Lactic Acid, Venous 1.8 0.5 - 1.9 mmol/L    Comment: Performed at [facility], [address] [place],  [place] [address]  Beta-hydroxybutyric acid     Status: Abnormal   Collection Time: 07/03/19  1:03 PM  Result Value Ref Range   Beta-Hydroxybutyric Acid 4.38 (H) 0.05 - 0.27  mmol/L    Comment: Performed at [facility], [address], [place] [address]  POC SARS Coronavirus 2 Ag     Status: None   Collection Time: 07/03/19  1:39 PM  Result Value Ref Range   SARS Coronavirus 2 Ag NEGATIVE NEGATIVE    Comment: (NOTE) SARS-CoV-2 antigen NOT DETECTED.  Negative results are presumptive.  Negative results do not preclude SARS-CoV-2 infection and should not be used as the sole basis for treatment or other patient management decisions, including infection  control decisions, particularly in the presence of clinical signs and  symptoms consistent with COVID-19, or in those who have been in contact with the virus.  Negative results must be combined with clinical observations, patient history, and epidemiological information. The expected result is Negative. Fact Sheet for Patients: PodPark.tn Fact Sheet for Healthcare Providers: GiftContent.is This test is not yet approved or cleared by the [place] FDA and  has been authorized for detection and/or diagnosis of SARS-CoV-2 by FDA under an Emergency Use Authorization (EUA).  This EUA will remain in effect (meaning this test can be used) for the duration of  the COVID-19 de claration under Section 564(b)(1) of the Act, 21 U.S.C. section 360bbb-3(b)(1), unless the authorization is terminated or revoked sooner.   Glucose, capillary     Status: Abnormal   Collection Time: 07/03/19  4:15 PM  Result Value Ref Range   Glucose-Capillary 145 (H) 70 - 99 mg/dL  Respiratory Panel by RT PCR (Flu A&B, Covid) - Nasopharyngeal Swab     Status: Abnormal   Collection Time: 07/03/19  5:11 PM   Specimen: Nasopharyngeal Swab  Result Value Ref Range   SARS Coronavirus 2 by RT PCR POSITIVE (A) NEGATIVE    Comment: RESULT CALLED TO, READ BACK BY AND VERIFIED WITH: LORRIE LEMONS @ 1826 ON 07/03/2019 BY CAF (NOTE) SARS-CoV-2 target nucleic acids are  DETECTED. SARS-CoV-2 RNA is generally detectable in upper respiratory specimens  during the acute phase of infection. Positive results are indicative of the presence of the identified virus, but do not rule out bacterial infection or co-infection with other pathogens not detected by the test. Clinical correlation with patient history and other diagnostic information is necessary to determine patient infection status. The expected result is Negative. Fact Sheet for Patients:  PinkCheek.be Fact Sheet for Healthcare Providers: GravelBags.it This test is not yet approved or cleared by the [place] FDA and  has been authorized for detection and/or diagnosis of SARS-CoV-2 by FDA under an Emergency Use Authorization (EUA).  This EUA will remain in effect (meaning this test can be [place] ed) for the duration of  the COVID-19 declaration under Section 564(b)(1) of the Act, 21 U.S.C. section 360bbb-3(b)(1), unless the authorization is terminated or revoked sooner.    Influenza A by PCR NEGATIVE NEGATIVE   Influenza B by PCR NEGATIVE NEGATIVE    Comment: (NOTE) The Xpert Xpress SARS-CoV-2/FLU/RSV assay is intended as an aid in  the diagnosis of influenza from Nasopharyngeal swab specimens and  should not be used as a sole basis for treatment. Nasal washings and  aspirates are unacceptable for Xpert Xpress SARS-CoV-2/FLU/RSV  testing. Fact Sheet for Patients: PinkCheek.be Fact Sheet for Healthcare Providers: GravelBags.it This test is not yet approved or cleared by the [place]  FDA and  has been authorized for detection and/or diagnosis of SARS-CoV-2 by  FDA under an Emergency Use Authorization (EUA). This EUA will remain  in effect (meaning this test can be used) for the duration of the  Covid-19 declaration under Section 564(b)(1) of the Act, 21  U.S.C. section  360bbb-3(b)(1), unless the authorization is  terminated or revoked. Performed at [facility], [address] [place], [place] [id number]   Basic metabolic panel     Status: Abnormal   Collection Time: 07/03/19  5:19 PM  Result Value Ref Range   Sodium 140 135 - 145 mmol/L   Potassium 4.0 3.5 - 5.1 mmol/L   Chloride 108 98 - 111 mmol/L   CO2 16 (L) 22 - 32 mmol/L   Glucose, Bld 161 (H) 70 - 99 mg/dL   BUN 14 6 - 20 mg/dL   Creatinine, Ser 0.96 0.61 - 1.24 mg/dL   Calcium 9.0 8.9 - 10.3 mg/dL   GFR calc non Af Amer >60 >60 mL/min   GFR calc Af Amer >60 >60 mL/min   Anion gap 16 (H) 5 - 15    Comment: Performed at [facility], [address] [place], [place] [address]  Beta-hydroxybutyric acid     Status: Abnormal   Collection Time: 07/03/19  5:19 PM  Result Value Ref Range   Beta-Hydroxybutyric Acid 4.18 (H) 0.05 - 0.27 mmol/L    Comment: Performed at [facility], [address] [place], [place] [address]  Glucose, capillary     Status: Abnormal   Collection Time: 07/03/19  8:41 PM  Result Value Ref Range   Glucose-Capillary 145 (H) 70 - 99 mg/dL  Basic metabolic panel     Status: Abnormal   Collection Time: 07/03/19  8:53 PM  Result Value Ref Range   Sodium 138 135 - 145 mmol/L   Potassium 3.6 3.5 - 5.1 mmol/L   Chloride 107 98 - 111 mmol/L   CO2 16 (L) 22 - 32 mmol/L   Glucose, Bld 162 (H) 70 - 99 mg/dL   BUN 13 6 - 20 mg/dL   Creatinine, Ser 0.95 0.61 - 1.24 mg/dL   Calcium 8.9 8.9 - 10.3 mg/dL   GFR calc non Af Amer >60 >60 mL/min   GFR calc Af Amer >60 >60 mL/min   Anion gap 15 5 - 15    Comment: Performed at [facility], [address] [place], [place] [address]  Glucose, capillary     Status: Abnormal   Collection Time: 07/04/19 12:05 AM  Result Value Ref Range   Glucose-Capillary 154 (H) 70 - 99 mg/dL   DG Chest Portable 1 View  Result Date: 07/03/2019 CLINICAL DATA:  COVID-19 positive. EXAM: PORTABLE CHEST 1 VIEW  COMPARISON:  September 07, 2014. FINDINGS: The heart size and mediastinal contours are within normal limits. Both lungs are clear. The visualized skeletal structures are unremarkable. IMPRESSION: No active disease. Electronically Signed   By: Marijo Conception M.D.   On: 07/03/2019 11:20    Pending Labs Unresulted Labs (From admission, onward)    Start     Ordered   07/03/19 1709  HIV Antibody (routine testing w rflx)  (HIV Antibody (Routine testing w reflex) panel)  Once,   STAT     07/03/19 1708   07/03/19 [id number]  Basic metabolic panel  (Diabetes Ketoacidosis (DKA))  STAT Now then every 4 hours ,   STAT     07/03/19 1708  07/03/19 1709  Beta-hydroxybutyric acid  (Diabetes Ketoacidosis (DKA))  Now then every 8 hours,   STAT     07/03/19 1708   07/03/19 1414  C difficile quick scan w PCR reflex  (C Difficile quick screen w PCR reflex panel)  Once, for 24 hours,   STAT     07/03/19 1413   07/03/19 1011  Urinalysis, Complete w Microscopic  Once,   STAT     07/03/19 1010          Vitals/Pain Today's Vitals   07/03/19 2043 07/03/19 2313 07/04/19 0014 07/04/19 0100  BP: 138/81 128/71 113/74 123/71  Pulse: (!) 115 97 (!) 105 99  Resp: 14 19 18  (!) 24  Temp:  98.7 F (37.1 C)    TempSrc:  Oral    SpO2: 99% 100% 99% 98%  Weight:      Height:      PainSc: 5  4       Isolation Precautions Airborne and Contact precautions  Medications Medications  promethazine (PHENERGAN) injection 12.5 mg (12.5 mg Intravenous Given 07/03/19 1024)  ondansetron (ZOFRAN) injection 4 mg (4 mg Intravenous Given 07/03/19 2245)  0.9 %  sodium chloride infusion ( Intravenous Stopped 07/03/19 2316)  enoxaparin (LOVENOX) injection 40 mg (40 mg Subcutaneous Given 07/04/19 0017)  insulin regular, human (MYXREDLIN) 100 units/ 100 mL infusion (4.8 Units/hr Intravenous Rate/Dose Change 07/04/19 0007)  dextrose 5 %-0.45 % sodium chloride infusion ( Intravenous New Bag/Given 07/03/19 2257)  dextrose 50 % solution 0-50 mL  (has no administration in time range)  potassium chloride 10 mEq in 100 mL IVPB (0 mEq Intravenous Stopped 07/04/19 0007)  ALPRAZolam (XANAX) tablet 0.5 mg (has no administration in time range)  oxyCODONE-acetaminophen (PERCOCET) 7.5-325 MG per tablet 1 tablet (has no administration in time range)  rosuvastatin (CRESTOR) tablet 20 mg (0 mg Oral Hold 07/04/19 0027)  QUEtiapine (SEROQUEL) tablet 50 mg (has no administration in time range)  Vilazodone HCl (VIIBRYD) TABS 40 mg (0 mg Oral Hold 07/04/19 0028)  dicyclomine (BENTYL) tablet 20 mg (0 mg Oral Hold 07/04/19 0028)  pantoprazole (PROTONIX) EC tablet 40 mg (0 mg Oral Hold 07/04/19 0029)  lamoTRIgine (LAMICTAL) tablet 100 mg (0 mg Oral Hold 07/04/19 0029)  ketoconazole (NIZORAL) 2 % cream 1 application (has no administration in time range)  sodium chloride 0.9 % bolus 500 mL (0 mLs Intravenous Stopped 07/03/19 1022)  ondansetron (ZOFRAN) injection 4 mg (4 mg Intravenous Given 07/03/19 0949)  sodium chloride 0.9 % bolus 500 mL (0 mLs Intravenous Stopped 07/03/19 1259)  sodium chloride 0.9 % bolus 500 mL (0 mLs Intravenous Stopped 07/03/19 1342)  haloperidol lactate (HALDOL) injection 5 mg (5 mg Intravenous Given 07/03/19 1241)  sodium chloride 0.9 % bolus 1,000 mL (0 mLs Intravenous Stopped 07/03/19 1536)  insulin aspart (novoLOG) injection 6 Units (6 Units Intravenous Given 07/03/19 1537)    Mobility walks Low fall risk   Focused Assessments Cardiac Assessment Handoff:    Lab Results  Component Value Date   CKTOTAL 64 09/07/2014   CKMB 1.2 09/07/2014   TROPONINI < 0.03 09/07/2014   No results found for: DDIMER Does the Patient currently have chest pain? No     R Recommendations: See Admitting Provider Note  Report given to:  Mariah RN on ICU  Additional Notes:

## 2019-07-05 LAB — BASIC METABOLIC PANEL
Anion gap: 11 (ref 5–15)
BUN: 10 mg/dL (ref 6–20)
CO2: 20 mmol/L — ABNORMAL LOW (ref 22–32)
Calcium: 8.7 mg/dL — ABNORMAL LOW (ref 8.9–10.3)
Chloride: 108 mmol/L (ref 98–111)
Creatinine, Ser: 0.69 mg/dL (ref 0.61–1.24)
GFR calc Af Amer: >60 mL/min (ref >60)
GFR calc non Af Amer: >60 mL/min (ref >60)
Glucose, Bld: 184 mg/dL — ABNORMAL HIGH (ref 70–99)
Potassium: 3.2 mmol/L — ABNORMAL LOW (ref 3.5–5.1)
Sodium: 139 mmol/L (ref 135–145)

## 2019-07-05 LAB — GLUCOSE, CAPILLARY: Glucose-Capillary: 173 mg/dL — ABNORMAL HIGH (ref 70–99)

## 2019-07-05 LAB — PHOSPHORUS: Phosphorus: 3 mg/dL (ref 2.5–4.6)

## 2019-07-05 MED ORDER — POTASSIUM CHLORIDE CRYS ER 20 MEQ PO TBCR: 40.0000 meq | EXTENDED_RELEASE_TABLET | Freq: Once | ORAL | Status: DC

## 2019-07-05 MED ORDER — ONDANSETRON HCL 4 MG PO TABS: 4.0000 mg | ORAL_TABLET | Freq: Every day | ORAL | 1 refills | Status: AC | PRN

## 2019-07-05 NOTE — Discharge Summary (Signed)
Physician Discharge Summary  Raymond Rose [id number] DOB: 1975/11/23 DOA: 07/03/2019  PCP: Birdie Sons, MD  Admit date: 07/03/2019 Discharge date: 07/05/2019  Admitted From: Home Disposition: Home  Recommendations for Outpatient Follow-up:  1. Follow up with PCP in 1-2 weeks 2. Consider referral to endocrinology  Home Health: No Equipment/Devices: None Discharge Condition: Stable CODE STATUS: Full Diet recommendation: Carb Modified  Brief/Interim Summary:  Raymond Rose a 44 y.o.malewith medical history significant ofdiabetes, hyperlipidemia, depression with anxiety, OSA,who presents with nausea, vomiting, diarrhea.  Patientstates that he wasrecently tested positive for COVID-19 on 06/24/19. Initiallyhe had some fever, chills, cough,which haveall resolved. Patient does not have any shortness breath or chest pain today. He states that he has been having nausea, vomiting, diarrhea in the past 3 days, which has worsened today. He has multiple nonbloody nonbiliary vomitus each day. He has several episode of watery diarrhea. He has mild abdominal discomfort andcramps. No symptoms of UTI or unilateral weakness.  1/17: Patient seen and examined.  Stepdown status in the intensive care unit.  Remains on the insulin infusion.  Stable no distress.  Glucose is decreasing appropriately.  Anion gap closing.  1/18: Acidosis resolved.  Patient tolerating diet without issue.  Some residual nausea but no vomiting.  Glucose is within normal range.  Patient stable for discharge at this time.  Unclear trigger for diabetic ketoacidosis.  This working diagnosis is acute Covid infection without obvious symptoms had trigger diabetic ketoacidosis.  Patient endorses compliance with all diabetic medications.  No changes in the remaining home regimen.  Patient is instructed to see his primary care physician within a week of discharge and inquire about possible referral to endocrinology in  the setting of recent diabetic ketoacidosis.  Discharge Diagnoses:  Principal Problem:   DKA (diabetic ketoacidoses) ([place]) Active Problems:   Hyperlipidemia, mixed   Diabetes mellitus without complication ([place])   Depression with anxiety   COVID-19 virus infection   Nausea vomiting and diarrhea  DKA (diabetic ketoacidoses) ([place]): Patient has been on insulin drip overnight Gap closing Glycemic control improved Suspected trigger is Covid infection Patient reports compliance with outpatient diabetic regimen Insulin GTT via Endo tool initiated, and transition to subcutaneous regimen upon closure of anion gap Patient tolerated transition well without issues Tolerating p.o. diet on the day of discharge No changes made home diabetic regimen Follow-up outpatient PCP   Hyperlipidemia, mixed: -crestor  Diabetes mellitus without complication ([place]): Last A1c8.7 on 05/24/19, poorly controled.  Patient is takingMetformin, glipizide, Trulicity, Farxigaat home. Now has DKA -on DKA protocol, see above  Depression with anxiety:  -continue home meds  RecentCOVID-19 virus infection: Patient is asymptomatic. -observe closely -Post discharge self quarantine isolation and monitoring recommendations provided to patient at bedside  Nausea vomiting and diarrhea:  Suspected secondary to DKA Resolved   Discharge Instructions  Discharge Instructions    Diet - low sodium heart healthy   Complete by: As directed    Discharge instructions   Complete by: As directed    Please make sure he self quarantine at home for a period of 7 to 14 days post discharge.  Take your temperature and pulse oximeter at least twice a day or anytime you are feeling feverish or short of breath.  Please ensure you are using separate bedrooms and separate bathrooms and not sharing times in the common areas.  Recommend that you wear masks in the house at all times for the self quarantine period.  If it  anytime you are  feeling weak short of breath or persistent fever low oxygen saturation please call your doctor or present back to the emergency department.   Increase activity slowly   Complete by: As directed    MyChart COVID-19 home monitoring program   Complete by: Jul 05, 2019    Is the patient willing to use the [place] for home monitoring?: Yes   Temperature monitoring   Complete by: Jul 05, 2019    After how many days would you like to receive a notification of this patient's flowsheet entries?: 1     Allergies as of 07/05/2019      Reactions   Bupropion    Extreme anxiety   Morphine       Medication List    TAKE these medications   ALPRAZolam 0.5 MG tablet Commonly known as: XANAX Take 1 tablet (0.5 mg total) by mouth every 4 (four) hours as needed. For anxiety   dicyclomine 20 MG tablet Commonly known as: Bentyl Take 1 tablet (20 mg total) by mouth every 6 (six) hours. For irritable bowel   Farxiga 10 MG Tabs tablet Generic drug: dapagliflozin propanediol TAKE 1 TABLET BY MOUTH DAILY What changed: how much to take   fluconazole 150 MG tablet Commonly known as: DIFLUCAN TAKE 1 TABLET BY MOUTH AS ONE DOSE   glipiZIDE 10 MG 24 hr tablet Commonly known as: GLUCOTROL XL TAKE 1 TABLET BY MOUTH DAILY WITH BREAKFAST.   ketoconazole 2 % cream Commonly known as: NIZORAL APPLY TO AFFECTED AREA ONCE DAILY FOR 7 DAYS AS NEEDED What changed: See the new instructions.   lamoTRIgine 100 MG tablet Commonly known as: LaMICtal Take 1 tablet (100 mg total) by mouth daily. For mood   metFORMIN 500 MG 24 hr tablet Commonly known as: GLUCOPHAGE-XR TAKE 2 TABLETS BY MOUTH TWICE A DAY What changed: when to take this   omeprazole 40 MG capsule Commonly known as: PRILOSEC TAKE 1 CAPSULE (40 MG TOTAL) BY MOUTH DAILY.   ondansetron 4 MG tablet Commonly known as: Zofran Take 1 tablet (4 mg total) by mouth daily as needed for nausea or vomiting.    oxyCODONE-acetaminophen 7.5-325 MG tablet Commonly known as: PERCOCET Take 1 tablet by mouth every 4 (four) hours as needed for pain.   predniSONE 10 MG tablet Commonly known as: DELTASONE 6 TABLETS FOR 2 DAYS, THEN 5 FOR 2 DAYS, THEN 4 FOR 2 DAYS, THEN 3 FOR 2 DAYS, THEN 2 FOR 2 DAYS, THEN 1 FOR 2 DAYS.   promethazine 25 MG tablet Commonly known as: PHENERGAN Take 1 tablet (25 mg total) by mouth every 6 (six) hours as needed for nausea or vomiting.   QUEtiapine 50 MG tablet Commonly known as: SEROquel Take 1 tablet (50 mg total) by mouth at bedtime.   rosuvastatin 20 MG tablet Commonly known as: Crestor Take 1 tablet (20 mg total) by mouth daily.   sildenafil 50 MG tablet Commonly known as: VIAGRA TAKE ONE OR TWO TABLETS AS NEEDED, NO MORE THAN 2 IN A DAY INSURANCE COVERS 6 TAB IN 25 DAYS   Trulicity 1.5 0000000 Sopn Generic drug: Dulaglutide Inject 1.5 mg into the skin once a week.   Vilazodone HCl 40 MG Tabs Commonly known as: VIIBRYD Take 1 tablet (40 mg total) by mouth daily.       Allergies  Allergen Reactions  . Bupropion     Extreme anxiety  . Morphine     Consultations:  none   Procedures/Studies: DG Chest  Portable 1 View  Result Date: 07/03/2019 CLINICAL DATA:  COVID-19 positive. EXAM: PORTABLE CHEST 1 VIEW COMPARISON:  September 07, 2014. FINDINGS: The heart size and mediastinal contours are within normal limits. Both lungs are clear. The visualized skeletal structures are unremarkable. IMPRESSION: No active disease. Electronically Signed   By: Marijo Conception M.D.   On: 07/03/2019 11:20    (Echo, Carotid, EGD, Colonoscopy, ERCP)    Subjective: Seen and examined the day of discharge No acute status changes over interval No new complaints Tolerating p.o. diet without issue Medically stable for discharge   Discharge Exam: Vitals:   07/05/19 0900 07/05/19 0914  BP:  (!) 108/58  Pulse: 84   Resp: 14   Temp:  97.6 F (36.4 C)  SpO2: 100%     Vitals:   07/05/19 0700 07/05/19 0800 07/05/19 0900 07/05/19 0914  BP:    (!) 108/58  Pulse: 78 82 84   Resp: 16 14 14    Temp:    97.6 F (36.4 C)  TempSrc:    Oral  SpO2: 99% 98% 100%   Weight:      Height:        General: Pt is alert, awake, not in acute distress Cardiovascular: RRR, S1/S2 +, no rubs, no gallops Respiratory: CTA bilaterally, no wheezing, no rhonchi Abdominal: Soft, NT, ND, bowel sounds + Extremities: no edema, no cyanosis    The results of significant diagnostics from this hospitalization (including imaging, microbiology, ancillary and laboratory) are listed below for reference.     Microbiology: Recent Results (from the past 240 hour(s))  Respiratory Panel by RT PCR (Flu A&B, Covid) - Nasopharyngeal Swab     Status: Abnormal   Collection Time: 07/03/19  5:11 PM   Specimen: Nasopharyngeal Swab  Result Value Ref Range Status   SARS Coronavirus 2 by RT PCR POSITIVE (A) NEGATIVE Final    Comment: RESULT CALLED TO, READ BACK BY AND VERIFIED WITH: LORRIE LEMONS @ 1826 ON 07/03/2019 BY CAF (NOTE) SARS-CoV-2 target nucleic acids are DETECTED. SARS-CoV-2 RNA is generally detectable in upper respiratory specimens  during the acute phase of infection. Positive results are indicative of the presence of the identified virus, but do not rule out bacterial infection or co-infection with other pathogens not detected by the test. Clinical correlation with patient history and other diagnostic information is necessary to determine patient infection status. The expected result is Negative. Fact Sheet for Patients:  PinkCheek.be Fact Sheet for Healthcare Providers: GravelBags.it This test is not yet approved or cleared by the [place] FDA and  has been authorized for detection and/or diagnosis of SARS-CoV-2 by FDA under an Emergency Use Authorization (EUA).  This EUA will remain in effect (meaning this  test can be [place] ed) for the duration of  the COVID-19 declaration under Section 564(b)(1) of the Act, 21 U.S.C. section 360bbb-3(b)(1), unless the authorization is terminated or revoked sooner.    Influenza A by PCR NEGATIVE NEGATIVE Final   Influenza B by PCR NEGATIVE NEGATIVE Final    Comment: (NOTE) The Xpert Xpress SARS-CoV-2/FLU/RSV assay is intended as an aid in  the diagnosis of influenza from Nasopharyngeal swab specimens and  should not be used as a sole basis for treatment. Nasal washings and  aspirates are unacceptable for Xpert Xpress SARS-CoV-2/FLU/RSV  testing. Fact Sheet for Patients: PinkCheek.be Fact Sheet for Healthcare Providers: GravelBags.it This test is not yet approved or cleared by the [place] FDA and  has been authorized for detection  and/or diagnosis of SARS-CoV-2 by  FDA under an Emergency Use Authorization (EUA). This EUA will remain  in effect (meaning this test can be used) for the duration of the  Covid-19 declaration under Section 564(b)(1) of the Act, 21  U.S.C. section 360bbb-3(b)(1), unless the authorization is  terminated or revoked. Performed at [facility], [address] [place], [place] [address]   MRSA PCR Screening     Status: None   Collection Time: 07/04/19  1:32 AM   Specimen: Nasopharyngeal  Result Value Ref Range Status   MRSA by PCR NEGATIVE NEGATIVE Final    Comment:        The GeneXpert MRSA Assay (FDA approved for NASAL specimens only), is one component of a comprehensive MRSA colonization surveillance program. It is not intended to diagnose MRSA infection nor to guide or monitor treatment for MRSA infections. Performed at [facility], [address] [place],  [address]      Labs: BNP (last 3 results) No results for input(s): BNP in the last 8760 hours. Basic Metabolic Panel: Recent Labs  Lab 07/03/19 2053 07/04/19 0140  07/04/19 0546 07/04/19 0849 07/05/19 0423  NA 138 138 138 139 139  K 3.6 3.7 3.2* 3.3* 3.2*  CL 107 108 109 109 108  CO2 16* 16* 16* 19* 20*  GLUCOSE 162* 155* 147* 148* 184*  BUN 13 13 12 12 10   CREATININE 0.95 0.86 0.79 0.88 0.69  CALCIUM 8.9 8.8* 8.7* 8.7* 8.7*  MG  --   --  1.9  --   --   PHOS  --   --  1.7*  --  3.0   Liver Function Tests: Recent Labs  Lab 07/03/19 0855  AST 17  ALT 17  ALKPHOS 97  BILITOT 2.0*  PROT 9.5*  ALBUMIN 4.6   Recent Labs  Lab 07/03/19 0855  LIPASE 30   No results for input(s): AMMONIA in the last 168 hours. CBC: Recent Labs  Lab 07/03/19 0855  WBC 12.9*  HGB 16.3  HCT 50.2  MCV 85.1  PLT 427*   Cardiac Enzymes: No results for input(s): CKTOTAL, CKMB, CKMBINDEX, TROPONINI in the last 168 hours. BNP: Invalid input(s): POCBNP CBG: Recent Labs  Lab 07/04/19 1150 07/04/19 1317 07/04/19 1736 07/04/19 2123 07/05/19 0904  GLUCAP 202* 167* 174* 187* 173*   D-Dimer No results for input(s): DDIMER in the last 72 hours. Hgb A1c No results for input(s): HGBA1C in the last 72 hours. Lipid Profile No results for input(s): CHOL, HDL, LDLCALC, TRIG, CHOLHDL, LDLDIRECT in the last 72 hours. Thyroid function studies No results for input(s): TSH, T4TOTAL, T3FREE, THYROIDAB in the last 72 hours.  Invalid input(s): FREET3 Anemia work up No results for input(s): VITAMINB12, FOLATE, FERRITIN, TIBC, IRON, RETICCTPCT in the last 72 hours. Urinalysis    Component Value Date/Time   COLORURINE YELLOW (A) 07/03/2019 0855   APPEARANCEUR CLEAR (A) 07/03/2019 0855   LABSPEC 1.035 (H) 07/03/2019 0855   PHURINE 6.0 07/03/2019 0855   GLUCOSEU >=500 (A) 07/03/2019 0855   HGBUR NEGATIVE 07/03/2019 0855   BILIRUBINUR NEGATIVE 07/03/2019 0855   BILIRUBINUR Neg 06/22/2015 1136   KETONESUR 80 (A) 07/03/2019 0855   PROTEINUR 100 (A) 07/03/2019 0855   UROBILINOGEN 0.2 06/22/2015 1136   NITRITE NEGATIVE 07/03/2019 0855   LEUKOCYTESUR NEGATIVE  07/03/2019 0855   Sepsis Labs Invalid input(s): PROCALCITONIN,  WBC,  LACTICIDVEN Microbiology Recent Results (from the past 240 hour(s))  Respiratory Panel by RT PCR (Flu A&B,  Covid) - Nasopharyngeal Swab     Status: Abnormal   Collection Time: 07/03/19  5:11 PM   Specimen: Nasopharyngeal Swab  Result Value Ref Range Status   SARS Coronavirus 2 by RT PCR POSITIVE (A) NEGATIVE Final    Comment: RESULT CALLED TO, READ BACK BY AND VERIFIED WITH: LORRIE LEMONS @ 1826 ON 07/03/2019 BY CAF (NOTE) SARS-CoV-2 target nucleic acids are DETECTED. SARS-CoV-2 RNA is generally detectable in upper respiratory specimens  during the acute phase of infection. Positive results are indicative of the presence of the identified virus, but do not rule out bacterial infection or co-infection with other pathogens not detected by the test. Clinical correlation with patient history and other diagnostic information is necessary to determine patient infection status. The expected result is Negative. Fact Sheet for Patients:  PinkCheek.be Fact Sheet for Healthcare Providers: GravelBags.it This test is not yet approved or cleared by the [place] FDA and  has been authorized for detection and/or diagnosis of SARS-CoV-2 by FDA under an Emergency Use Authorization (EUA).  This EUA will remain in effect (meaning this test can be [place] ed) for the duration of  the COVID-19 declaration under Section 564(b)(1) of the Act, 21 U.S.C. section 360bbb-3(b)(1), unless the authorization is terminated or revoked sooner.    Influenza A by PCR NEGATIVE NEGATIVE Final   Influenza B by PCR NEGATIVE NEGATIVE Final    Comment: (NOTE) The Xpert Xpress SARS-CoV-2/FLU/RSV assay is intended as an aid in  the diagnosis of influenza from Nasopharyngeal swab specimens and  should not be used as a sole basis for treatment. Nasal washings and  aspirates are unacceptable for  Xpert Xpress SARS-CoV-2/FLU/RSV  testing. Fact Sheet for Patients: PinkCheek.be Fact Sheet for Healthcare Providers: GravelBags.it This test is not yet approved or cleared by the [place] FDA and  has been authorized for detection and/or diagnosis of SARS-CoV-2 by  FDA under an Emergency Use Authorization (EUA). This EUA will remain  in effect (meaning this test can be used) for the duration of the  Covid-19 declaration under Section 564(b)(1) of the Act, 21  U.S.C. section 360bbb-3(b)(1), unless the authorization is  terminated or revoked. Performed at [facility], [address] [place], [place] [address]   MRSA PCR Screening     Status: None   Collection Time: 07/04/19  1:32 AM   Specimen: Nasopharyngeal  Result Value Ref Range Status   MRSA by PCR NEGATIVE NEGATIVE Final    Comment:        The GeneXpert MRSA Assay (FDA approved for NASAL specimens only), is one component of a comprehensive MRSA colonization surveillance program. It is not intended to diagnose MRSA infection nor to guide or monitor treatment for MRSA infections. Performed at [facility], [address], [place], [place] [address]      Time coordinating discharge: Over 30 minutes  SIGNED:   Sidney Ace, MD  [facility] 07/05/2019, 4:20 PM Pager   If 7PM-7AM, please contact night-coverage [email or web address] Password [email or web address]

## 2019-07-05 NOTE — Progress Notes (Signed)
Pt given dc instructions and verbalized understanding. Pt dc home.

## 2019-07-06 ENCOUNTER — Telehealth: Payer: Self-pay

## 2019-07-06 NOTE — Telephone Encounter (Signed)
I have made the 1st attempt to contact the patient or family member in charge, in order to follow up from recently being discharged from the hospital. I left a message on voicemail requesting a CB. -MM 

## 2019-07-06 NOTE — Telephone Encounter (Signed)
No HFU scheduled.  

## 2019-07-07 ENCOUNTER — Other Ambulatory Visit: Payer: Self-pay | Admitting: Family Medicine

## 2019-07-07 NOTE — Telephone Encounter (Signed)
I have made the 2nd attempt to contact the patient or family member in charge, in order to follow up from recently being discharged from the hospital. I left a message on voicemail requesting a CB. -MM  

## 2019-07-07 NOTE — Telephone Encounter (Signed)
Please review refills for promethazine 25 mg tab, alprazolam 0.5 mg tab (controlled medications) and  trulicity 1.5mg  (historical provider and medication)

## 2019-07-08 NOTE — Telephone Encounter (Signed)
Tried to contact pt twice to complete TCM call. Left messages to CB and pt did not. TCM call not completed. FYI!

## 2019-07-12 ENCOUNTER — Telehealth: Payer: Self-pay

## 2019-07-12 ENCOUNTER — Other Ambulatory Visit: Payer: Self-pay | Admitting: Family Medicine

## 2019-07-12 ENCOUNTER — Other Ambulatory Visit: Payer: Self-pay

## 2019-07-12 ENCOUNTER — Ambulatory Visit (INDEPENDENT_AMBULATORY_CARE_PROVIDER_SITE_OTHER): Payer: 59 | Admitting: Psychiatry

## 2019-07-12 ENCOUNTER — Encounter: Payer: Self-pay | Admitting: Psychiatry

## 2019-07-12 DIAGNOSIS — F609 Personality disorder, unspecified: Secondary | ICD-10-CM

## 2019-07-12 DIAGNOSIS — F4010 Social phobia, unspecified: Secondary | ICD-10-CM

## 2019-07-12 DIAGNOSIS — F331 Major depressive disorder, recurrent, moderate: Secondary | ICD-10-CM

## 2019-07-12 DIAGNOSIS — F411 Generalized anxiety disorder: Secondary | ICD-10-CM

## 2019-07-12 DIAGNOSIS — Z634 Disappearance and death of family member: Secondary | ICD-10-CM | POA: Insufficient documentation

## 2019-07-12 MED ORDER — QUETIAPINE FUMARATE 50 MG PO TABS: 50.0000 mg | ORAL_TABLET | Freq: Every day | ORAL | 0 refills | Status: DC

## 2019-07-12 MED ORDER — VILAZODONE HCL 40 MG PO TABS: 40.0000 mg | ORAL_TABLET | Freq: Every day | ORAL | 0 refills | Status: DC

## 2019-07-12 NOTE — Telephone Encounter (Signed)
pt called left a message that one of his rx was not covered by his insurance and he would need something else in place of it. (pt did not leave medication name)

## 2019-07-12 NOTE — Progress Notes (Signed)
Provider Location : [facility] Patient Location : Home   Virtual Visit via Video Note  I connected with Raymond Rose on 07/12/19 at  2:15 PM EST by a video enabled telemedicine application and verified that I am speaking with the correct person using two identifiers.   I discussed the limitations of evaluation and management by telemedicine and the availability of in person appointments. The patient expressed understanding and agreed to proceed.   I discussed the assessment and treatment plan with the patient. The patient was provided an opportunity to ask questions and all were answered. The patient agreed with the plan and demonstrated an understanding of the instructions.   The patient was advised to call back or seek an in-person evaluation if the symptoms worsen or if the condition fails to improve as anticipated.   [place] MD OP Progress Note  07/12/2019 4:37 PM Raymond Rose  MRN:  [id number]  Chief Complaint:  Chief Complaint    Follow-up     HPI: Raymond Rose is a 44 year old Caucasian male, employed, married, lives in [place], has a history of depression, generalized anxiety disorder, social anxiety disorder, diabetes melitis, cyclical vomiting, sleep apnea, erectile dysfunction, arthritis, recent COVID-19 infection was evaluated by telemedicine today.  Patient reports he got infected with COVID-19 recently.  Patient reports he was also admitted to hospital for a few days since he had diabetic ketoacidosis after being infected with COVID-19.  He struggled with nausea and diarrhea and vomiting during that time.  Patient reports he currently feels much better.  He has upcoming appointment with endocrinology and his other providers.  Patient reports his other family members also got infected with COVID-19 and his mother passed away recently due to the same.  Patient reports he is trying to cope with her loss as best as he can.  He has been talking to his therapist Ms. Miguel Dibble and has  upcoming appointment scheduled on Wednesday.  Patient denies any suicidality, homicidality or perceptual disturbances at this time.  He is currently on short-term disability at least until May 2021.  Patient denies any other concerns today. Visit Diagnosis:    ICD-10-CM   1. MDD (major depressive disorder), recurrent episode, moderate (HCC)  F33.1 QUEtiapine (SEROQUEL) 50 MG tablet    Vilazodone HCl (VIIBRYD) 40 MG TABS  2. GAD (generalized anxiety disorder)  F41.1 QUEtiapine (SEROQUEL) 50 MG tablet  3. Social anxiety disorder  F40.10   4. Personality disorder, unspecified ([place])  F60.9   5. Bereavement  Z63.4     Past Psychiatric History: I have reviewed past psychiatric history from my progress note on 04/21/2019.  Past trials of Wellbutrin-side effects, Lexapro-initially worked but stopped working after that, Actor GI problems, Effexor  Past Medical History:  Past Medical History:  Diagnosis Date  . Anxiety   . Arthritis   . Depression   . Diabetes mellitus, type II ([place])   . OSA on CPAP   . Vertigo 01/03/2015    Past Surgical History:  Procedure Laterality Date  . 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 CHOLECYSTECTOMY  2010   lap Cholecystectomy  . 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 CT scan of Brain  12/23/2011   without contrast, [facility], Normal  . TONSILLECTOMY  1985    Family Psychiatric History: I have reviewed family psychiatric history from my progress note on 04/21/2019.  Family History:  Family History  Problem Relation Age of Onset  . Depression Mother   . Heart attack Father 12  . Arthritis Other   . Alcohol abuse Other   . Lung cancer Other   . Hyperlipidemia Other   . CAD Other   . Stroke Other   . Hypertension Other   . Bipolar disorder Other     Social History: I have reviewed social history from my progress note on 04/21/2019. Social History    Socioeconomic History  . Marital status: Married    Spouse name: Not on file  . Number of children: Not on file  . Years of education: Not on file  . Highest education level: Not on file  Occupational History  . Occupation: Banking    Comment: Engineer, manufacturing  Tobacco Use  . Smoking status: Former Smoker    Years: 5.00    Quit date: 06/17/1997    Years since quitting: 22.0  . Smokeless tobacco: Never Used  . Tobacco comment: started smoking at ahe 16, and quit at age 54  Substance and Sexual Activity  . Alcohol use: Yes    Alcohol/week: 0.0 standard drinks    Comment: occasional alcohol use  . Drug use: No  . Sexual activity: Not on file  Other Topics Concern  . Not on file  Social History Narrative  . Not on file   Social Determinants of Health   Financial Resource Strain:   . Difficulty of Paying Living Expenses: Not on file  Food Insecurity:   . Worried About Charity fundraiser in the Last Year: Not on file  . Ran Out of Food in the Last Year: Not on file  Transportation Needs:   . Lack of Transportation (Medical): Not on file  . Lack of Transportation (Non-Medical): Not on file  Physical Activity:   . Days of Exercise per Week: Not on file  . Minutes of Exercise per Session: Not on file  Stress:   . Feeling of Stress : Not on file  Social Connections:   . Frequency of Communication with Friends and Family: Not on file  . Frequency of Social Gatherings with Friends and Family: Not on file  . Attends Religious Services: Not on file  . Active Member of Clubs or Organizations: Not on file  . Attends Archivist Meetings: Not on file  . Marital Status: Not on file    Allergies:  Allergies  Allergen Reactions  . Bupropion     Extreme anxiety  . Morphine     Metabolic Disorder Labs: Lab Results  Component Value Date   HGBA1C 8.7 (H) 05/24/2019   No results found for: PROLACTIN Lab Results  Component Value Date   CHOL 173 05/24/2019   TRIG 718  (HH) 05/24/2019   HDL 30 (L) 05/24/2019   CHOLHDL 5.8 (H) 05/24/2019   LDLCALC 42 05/24/2019   [place] Comment 10/30/2018   Lab Results  Component Value Date   TSH 1.360 05/24/2019   TSH 1.570 01/24/2017    Therapeutic Level Labs: No results found for: LITHIUM No results found for: VALPROATE No components found for:  CBMZ  Current Medications: Current Outpatient Medications  Medication Sig Dispense Refill  . ALPRAZolam (XANAX) 0.5 MG tablet TAKE 1 TABLET (0.5 MG TOTAL) BY MOUTH EVERY 4 (FOUR) HOURS AS NEEDED. FOR ANXIETY 16 tablet 0  . dicyclomine (BENTYL) 20 MG tablet Take 1 tablet (20 mg total) by mouth every 6 (six) hours. For  irritable bowel 120 tablet 1  . FARXIGA 10 MG TABS tablet TAKE 1 TABLET BY MOUTH DAILY (Patient taking differently: Take 10 mg by mouth daily. ) 30 tablet 6  . fluconazole (DIFLUCAN) 150 MG tablet TAKE 1 TABLET BY MOUTH AS ONE DOSE 1 tablet 5  . glipiZIDE (GLUCOTROL XL) 10 MG 24 hr tablet TAKE 1 TABLET BY MOUTH DAILY WITH BREAKFAST. (Patient taking differently: Take 10 mg by mouth daily with breakfast. ) 90 tablet 4  . ketoconazole (NIZORAL) 2 % cream APPLY TO AFFECTED AREA ONCE DAILY FOR 7 DAYS AS NEEDED (Patient taking differently: Apply 1 application topically daily. APPLY TO AFFECTED AREA ONCE DAILY FOR 7 DAYS AS NEEDED) 15 g 3  . lamoTRIgine (LAMICTAL) 100 MG tablet Take 1 tablet (100 mg total) by mouth daily. For mood 90 tablet 0  . metFORMIN (GLUCOPHAGE-XR) 500 MG 24 hr tablet TAKE 2 TABLETS BY MOUTH TWICE A DAY (Patient taking differently: Take 1,000 mg by mouth 2 (two) times daily with a meal. ) 120 tablet 3  . omeprazole (PRILOSEC) 40 MG capsule TAKE 1 CAPSULE (40 MG TOTAL) BY MOUTH DAILY. 30 capsule 11  . ondansetron (ZOFRAN) 4 MG tablet Take 1 tablet (4 mg total) by mouth daily as needed for nausea or vomiting. 30 tablet 1  . oxyCODONE-acetaminophen (PERCOCET) 7.5-325 MG tablet Take 1 tablet by mouth every 4 (four) hours as needed for pain.    .  predniSONE (DELTASONE) 10 MG tablet 6 TABLETS FOR 2 DAYS, THEN 5 FOR 2 DAYS, THEN 4 FOR 2 DAYS, THEN 3 FOR 2 DAYS, THEN 2 FOR 2 DAYS, THEN 1 FOR 2 DAYS. 42 tablet 1  . promethazine (PHENERGAN) 25 MG tablet TAKE 1 TABLET (25 MG TOTAL) BY MOUTH EVERY 6 (SIX) HOURS AS NEEDED FOR NAUSEA OR VOMITING. 20 tablet 1  . QUEtiapine (SEROQUEL) 50 MG tablet Take 1 tablet (50 mg total) by mouth at bedtime. 90 tablet 0  . rosuvastatin (CRESTOR) 20 MG tablet Take 1 tablet (20 mg total) by mouth daily. 30 tablet 5  . TRULICITY 1.5 0000000 SOPN INJECT 1.5 MG INTO THE SKIN ONCE A WEEK. 4 pen 12  . Vilazodone HCl (VIIBRYD) 40 MG TABS Take 1 tablet (40 mg total) by mouth daily. 90 tablet 0  . sildenafil (VIAGRA) 50 MG tablet TAKE ONE OR TWO TABLETS AS NEEDED, NO MORE THAN 2 IN A DAY INSURANCE COVERS 6 TAB IN 25 DAYS (Patient not taking: Reported on 07/12/2019) 6 tablet 5   No current facility-administered medications for this visit.     Musculoskeletal: Strength & Muscle Tone: UTA Gait & Station: normal Patient leans: N/A  Psychiatric Specialty Exam: Review of Systems  Gastrointestinal: Positive for nausea.  Psychiatric/Behavioral: Positive for dysphoric mood.  All other systems reviewed and are negative.   There were no vitals taken for this visit.There is no height or weight on file to calculate BMI.  General Appearance: Casual  Eye Contact:  Fair  Speech:  Clear and Coherent  Volume:  Normal  Mood:  Depressed  Affect:  Congruent  Thought Process:  Goal Directed and Descriptions of Associations: Intact  Orientation:  Full (Time, Place, and Person)  Thought Content: Logical   Suicidal Thoughts:  No  Homicidal Thoughts:  No  Memory:  Immediate;   Fair Recent;   Fair Remote;   Fair  Judgement:  Fair  Insight:  Fair  Psychomotor Activity:  Normal  Concentration:  Concentration: Fair and Attention Span: Fair  Recall:  [place] of Knowledge: Fair  Language: Fair  Akathisia:  No  Handed:  Right   AIMS (if indicated): denies tremors, rigidity  Assets:  Communication Skills Desire for Improvement Housing Social Support  ADL's:  Intact  Cognition: WNL  Sleep:  Fair   Screenings: GAD-7     Office Visit from 04/16/2019 in [facility]  Total GAD-7 Score  20    PHQ2-9     Office Visit from 04/16/2019 in [facility] Visit from 02/10/2018 in [facility] Visit from 12/24/2016 in [facility] Visit from 05/23/2016 in [facility] Visit from 05/20/2016 in [facility]  PHQ-2 Total Score  6  1  1   0  0  PHQ-9 Total Score  25  4  1   --  3       Assessment and Plan: Dorian is a 44 year old Caucasian male, employed, lives in [place], has a history of depression, anxiety, obstructive sleep apnea on CPAP, diabetes melitis, arthritis was evaluated by telemedicine today.  Patient is biologically predisposed given his history of trauma, family history of mental health problems.  Patient is currently coping with the loss of his mother as well as his own COVID-19 infection, gradually recovering from the same.  He will continue to benefit from medications and psychotherapy sessions.  Plan as noted below.  Plan MDD-improving Viibryd 40 mg p.o. daily Lamotrigine 100 mg p.o. daily. Seroquel 50 mg p.o. nightly Continue psychotherapy sessions Ms. Hollie Salk upcoming appointment on Wednesday.  GAD-improving Continue CBT  Social anxiety disorder-improving Referred for CBT, continue CBT.   Bereavement-unstable Continue grief counseling with his therapist.  Follow-up in clinic in 2 to 4 weeks or sooner if needed.  February 23 at 2 PM  I have spent atleast 20 minutes non face to face with patient today. More than 50 % of the time was spent for  ordering medications and test ,psychoeducation and supportive psychotherapy and care coordination,as well as documenting clinical  information in electronic health record. This note was generated in part or whole with voice recognition software. Voice recognition is usually quite accurate but there are transcription errors that can and very often do occur. I apologize for any typographical errors that were not detected and corrected.       Ursula Alert, MD 07/12/2019, 4:37 PM

## 2019-07-12 NOTE — Telephone Encounter (Signed)
Pt needs new rx oxycodone. [facility] [address]

## 2019-07-12 NOTE — Telephone Encounter (Signed)
called left message to call office back with the name of the medication that is not covered anymore.

## 2019-07-13 MED ORDER — OXYCODONE-ACETAMINOPHEN 7.5-325 MG PO TABS: 1.0000 | ORAL_TABLET | ORAL | 0 refills | Status: DC | PRN

## 2019-07-13 NOTE — Telephone Encounter (Signed)
Ok thanks 

## 2019-07-15 ENCOUNTER — Other Ambulatory Visit: Payer: Self-pay | Admitting: Family Medicine

## 2019-07-15 ENCOUNTER — Telehealth: Payer: Self-pay

## 2019-07-15 MED ORDER — ALPRAZOLAM 0.5 MG PO TABS: 0.5000 mg | ORAL_TABLET | ORAL | 1 refills | Status: DC | PRN

## 2019-07-15 NOTE — Telephone Encounter (Signed)
received approved for one year notice for viibryd40mg 

## 2019-07-15 NOTE — Telephone Encounter (Signed)
OK 

## 2019-07-15 NOTE — Telephone Encounter (Signed)
pt will need a prior auth done on the viibryd.

## 2019-07-15 NOTE — Telephone Encounter (Signed)
went online and submitted prior authorization

## 2019-07-17 ENCOUNTER — Encounter (INDEPENDENT_AMBULATORY_CARE_PROVIDER_SITE_OTHER): Payer: Self-pay

## 2019-07-17 ENCOUNTER — Telehealth: Payer: Self-pay

## 2019-07-17 NOTE — Telephone Encounter (Signed)
Spoke with patient states that he has vomiting and diarrhea that triggered BPA. He states that his vomiting is normal giving hx of cyclic vomiting syndrome.  He dose states that he has had some diarrhea.  He denies S/S of dehydration HA, decreased urine output, dry mouth thrust, dizziness. He will continue intake of fluids and monitor for symptoms. If symptoms develop he will seek emergency care. Patient verbalized understanding of all information.

## 2019-07-20 ENCOUNTER — Telehealth: Payer: Self-pay

## 2019-07-20 NOTE — Telephone Encounter (Signed)
It does look like it was sent in for 30 pills and in the past it was 60

## 2019-07-20 NOTE — Telephone Encounter (Signed)
Copied from [place] [phone]. Topic: General - Inquiry >> Jul 20, 2019  9:00 AM Mathis Bud wrote: Reason for CRM: Patient is questioning if his medication oxyCODONE-acetaminophen (PERCOCET) 7.5-325 MG tablet  Was called in correctly.  Patient states it was less pills and it was more expensive for him when he picked it up at pharmacy. Call back [phone]

## 2019-07-23 ENCOUNTER — Telehealth: Payer: Self-pay

## 2019-07-23 NOTE — Telephone Encounter (Signed)
Patient called back to get an update on the status of this message. He wants to know if the qty of his hydrocodne was called in correctly. He states he usually gets 60 pills, but the last prescription was only for a qty of 30 pills. Please advise.

## 2019-07-23 NOTE — Telephone Encounter (Signed)
Copied from [place] [phone]. Topic: Medical Record Request - Other >> Jul 23, 2019 11:06 AM Erick Blinks wrote: Fax: [phone]  [place] number needed and most recent office notes for short term disability.   Claim number: [id number]   Route to [place] for [place] clinics. For all other clinics, route to the clinic's PEC Pool.

## 2019-07-23 NOTE — Telephone Encounter (Signed)
Copied from [place] [phone]. Topic: Medical Record Request - Other >> Jul 23, 2019 11:06 AM Erick Blinks wrote: Fax: [phone]  [place] number needed and most recent office notes for short term disability.   Claim number: [id number]   Route to [place] for [place] clinics. For all other clinics, route to the clinic's PEC Pool.

## 2019-07-23 NOTE — Telephone Encounter (Signed)
Ok to send if we have a release of information on file.

## 2019-07-23 NOTE — Telephone Encounter (Signed)
I called and spoke with patient. Patient states he was the caller from earlier today. He states that is still out on short term disability and [employer] need needs a copy his last office visit in order for them to extend his short term disability. Patient is requesting that we fax a copy of the last office note to [employer]. His claim number needs to be written across the top of the office note. Please advise if ok to send. Claim number and fax number listed below.

## 2019-07-23 NOTE — Telephone Encounter (Signed)
Oley Balm, this message does not have enough information, and I'm not sure what needs to be done. There is no call back number or name of the person who called. If medical records are being requested, then the caller would need to send a completed ROI (release of information) form to our office.

## 2019-07-23 NOTE — Telephone Encounter (Signed)
Copied from [place] [phone]. Topic: Medical Record Request - Other >> Jul 23, 2019 11:06 AM Erick Blinks wrote: Fax: [phone]  [place] number needed and most recent office notes for short term disability.   Claim number: [id number]   Route to [place] for [facility] clinics. For all other clinics, route to the clinic's PEC Pool.

## 2019-07-25 ENCOUNTER — Other Ambulatory Visit: Payer: Self-pay | Admitting: Family Medicine

## 2019-07-25 NOTE — Telephone Encounter (Signed)
Requested medication (s) are due for refill today: yes  Requested medication (s) are on the active medication list: yes  Last refill:  07/08/19  Future visit scheduled: yes  Notes to clinic:  medication not delegated to NT to refill.   Requested Prescriptions  Pending Prescriptions Disp Refills   promethazine (PHENERGAN) 25 MG tablet [Pharmacy Med Name: PROMETHAZINE 25 MG TABLET] 20 tablet 1    Sig: TAKE 1 TABLET (25 MG TOTAL) BY MOUTH EVERY 6 (SIX) HOURS AS NEEDED FOR NAUSEA OR VOMITING.      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   [facility] Birdie Sons, MD   2 months ago Type 2 diabetes mellitus without complication, without long-term current use of insulin [facility])   [facility] Birdie Sons, MD   3 months ago Depression, unspecified depression type   [facility] Birdie Sons, MD   3 months ago Nausea and vomiting, intractability of vomiting not specified, unspecified vomiting type   [place], [place], PA-C   5 months ago Type 2 diabetes mellitus without complication, without long-term current use of insulin [facility])   [facility] Birdie Sons, MD       Future Appointments             In 1 month Fisher, Kirstie Peri, MD [facility], [facility]

## 2019-07-26 ENCOUNTER — Other Ambulatory Visit: Payer: Self-pay | Admitting: Family Medicine

## 2019-07-26 NOTE — Telephone Encounter (Signed)
Pt's last OV was 05/24/2019. That OV note was faxed to [employer] on 06/01/2019 per request. Does that OV note need to be resent to [employer] as we haven't seen pt since 05/24/2019? Please advise. Thanks TNP

## 2019-07-26 NOTE — Telephone Encounter (Signed)
Last RF was 07/13/2019. Patient states he only received 30 tablets. Per patient he has been getting 60 tablets every 10 days. Please review.

## 2019-07-26 NOTE — Telephone Encounter (Signed)
Pt usually gets 60 tablets but last month only received 30/ pt needs refill for oxyCODONE-acetaminophen (PERCOCET) 7.5-325 MG [id number] [place] [place] [address] Phone:  [phone]  Fax:  [phone]     Please advise

## 2019-07-27 ENCOUNTER — Telehealth: Payer: Self-pay

## 2019-07-27 MED ORDER — OXYCODONE-ACETAMINOPHEN 7.5-325 MG PO TABS: 1.0000 | ORAL_TABLET | ORAL | 0 refills | Status: DC | PRN

## 2019-07-27 NOTE — Telephone Encounter (Signed)
Left patient a voicemail advising him that a new corrected RX has been sent to pharmacy.

## 2019-07-27 NOTE — Telephone Encounter (Signed)
Copied from [place] [phone]. Topic: General - Other >> Jul 27, 2019 12:32 PM Raymond Rose wrote: Reason for CRM: Patient called to inquire of Dr Caryn Section about his pain medication oxyCODONE-acetaminophen (PERCOCET) 7.5-325 MG tablet per patient last refill on 07/13/19 he only received half the dose he normally get and need to get it straightened out so he can get his proper refill. States that he would like Rose call back from Dr or nurse ASAP please Ph# [phone]

## 2019-07-28 ENCOUNTER — Telehealth: Payer: Self-pay | Admitting: Family Medicine

## 2019-07-28 DIAGNOSIS — F331 Major depressive disorder, recurrent, moderate: Secondary | ICD-10-CM

## 2019-07-28 DIAGNOSIS — F609 Personality disorder, unspecified: Secondary | ICD-10-CM

## 2019-07-28 NOTE — Telephone Encounter (Signed)
RX REFILL lamoTRIgine (LAMICTAL) 100 MG tablet  [id number] [place], [place] - [address] Phone:  [phone]  Fax:  [phone]

## 2019-07-30 MED ORDER — LAMOTRIGINE 100 MG PO TABS: 100.0000 mg | ORAL_TABLET | Freq: Every day | ORAL | 0 refills | Status: DC

## 2019-07-30 NOTE — Telephone Encounter (Signed)
I don't know. He has been seeing Dr. Alejandro Mulling since then. Maybe he needs her notes. If patient wants [place] to resend our notes that's fine, but if he needs more recent notes then he needs to call Dr. Lorette Ang office.

## 2019-08-02 ENCOUNTER — Other Ambulatory Visit: Payer: Self-pay | Admitting: Family Medicine

## 2019-08-02 DIAGNOSIS — N529 Male erectile dysfunction, unspecified: Secondary | ICD-10-CM

## 2019-08-02 MED ORDER — SILDENAFIL CITRATE 50 MG PO TABS: ORAL_TABLET | ORAL | 5 refills | Status: DC

## 2019-08-02 NOTE — Telephone Encounter (Signed)
[facility] for [facility] following medications:  sildenafil (VIAGRA) 50 MG tablet   Please advise.  Thanks, [employer]

## 2019-08-04 NOTE — Telephone Encounter (Signed)
Tried to contact pt. No answer and & voicemail was full. Please see messages below. Thanks TNP

## 2019-08-10 ENCOUNTER — Other Ambulatory Visit: Payer: Self-pay | Admitting: Family Medicine

## 2019-08-10 ENCOUNTER — Encounter: Payer: Self-pay | Admitting: Psychiatry

## 2019-08-10 ENCOUNTER — Ambulatory Visit (INDEPENDENT_AMBULATORY_CARE_PROVIDER_SITE_OTHER): Payer: 59 | Admitting: Psychiatry

## 2019-08-10 ENCOUNTER — Telehealth (HOSPITAL_COMMUNITY): Payer: Self-pay | Admitting: Psychiatry

## 2019-08-10 ENCOUNTER — Other Ambulatory Visit: Payer: Self-pay

## 2019-08-10 DIAGNOSIS — F331 Major depressive disorder, recurrent, moderate: Secondary | ICD-10-CM

## 2019-08-10 DIAGNOSIS — F609 Personality disorder, unspecified: Secondary | ICD-10-CM

## 2019-08-10 DIAGNOSIS — F4010 Social phobia, unspecified: Secondary | ICD-10-CM | POA: Diagnosis not present

## 2019-08-10 DIAGNOSIS — F411 Generalized anxiety disorder: Secondary | ICD-10-CM | POA: Diagnosis not present

## 2019-08-10 MED ORDER — QUETIAPINE FUMARATE 100 MG PO TABS: 100.0000 mg | ORAL_TABLET | Freq: Every day | ORAL | 0 refills | Status: DC

## 2019-08-10 NOTE — Telephone Encounter (Signed)
Tried to contact again about his request. No answer and voicemail is still full. Thanks TNP

## 2019-08-10 NOTE — Progress Notes (Signed)
Provider Location : [facility] Patient Location : Home  Virtual Visit via Video Note  I connected with Craige Rzepka Eisner on 08/10/19 at  2:00 PM EST by a video enabled telemedicine application and verified that I am speaking with the correct person using two identifiers.   I discussed the limitations of evaluation and management by telemedicine and the availability of in person appointments. The patient expressed understanding and agreed to proceed.    I discussed the assessment and treatment plan with the patient. The patient was provided an opportunity to ask questions and all were answered. The patient agreed with the plan and demonstrated an understanding of the instructions.   The patient was advised to call back or seek an in-person evaluation if the symptoms worsen or if the condition fails to improve as anticipated.   [place] MD OP Progress Note  08/10/2019 2:20 PM NIKITA KOPE  MRN:  [id number]  Chief Complaint:  Chief Complaint    Follow-up     HPI: Lexander is a 44 year old Caucasian male, employed, married, lives in [place], has a history of depression, GAD, social anxiety disorder, diabetes melitis, cyclical vomiting, sleep apnea, erectile dysfunction, arthritis, was evaluated by telemedicine today.  Patient reports he is currently struggling with depressive symptoms.  He reports he has good days and bad days.  He does report passive suicidality which is always there at the back of his mind.  He reports he struggles with chronic suicidality all the time.  He however denies any active plans.  He agrees to talk to his wife or get help if he has any worsening symptoms.  Patient however reports he has started looking into attending the intensive outpatient program and had that discussion with his therapist Ms. Miguel Dibble.  Patient reports he also has been struggling with sleep, the Seroquel was initially helpful but then it stopped working.  He is interested in dosage  increase.  Patient denies any homicidality or perceptual disturbances.  Patient continues to struggle with cyclical vomiting and has upcoming appointment with the specialist.  He continues to be out from work and has good social support system. Visit Diagnosis:    ICD-10-CM   1. MDD (major depressive disorder), recurrent episode, moderate (HCC)  F33.1 QUEtiapine (SEROQUEL) 100 MG tablet  2. GAD (generalized anxiety disorder)  F41.1 QUEtiapine (SEROQUEL) 100 MG tablet  3. Social anxiety disorder  F40.10   4. Personality disorder, unspecified ([place])  F60.9     Past Psychiatric History: I have reviewed past psychiatric history from my progress note on 04/21/2019.  Past trials of Wellbutrin, Lexapro, Elavil, Effexor  Past Medical History:  Past Medical History:  Diagnosis Date  . Anxiety   . Arthritis   . Depression   . Diabetes mellitus, type II ([place])   . OSA on CPAP   . Vertigo 01/03/2015    Past Surgical History:  Procedure Laterality Date  . 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 CHOLECYSTECTOMY  2010   lap Cholecystectomy  . 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 CT scan of Brain  12/23/2011   without contrast, [facility], Normal  . TONSILLECTOMY  1985    Family Psychiatric History: I have reviewed family psychiatric history from my progress note on 04/21/2019  Family History:  Family History  Problem Relation Age of Onset  . Depression Mother   . Heart attack  Father 66  . Arthritis Other   . Alcohol abuse Other   . Lung cancer Other   . Hyperlipidemia Other   . CAD Other   . Stroke Other   . Hypertension Other   . Bipolar disorder Other     Social History: I have reviewed social history from my progress note on 04/21/2019 Social History   Socioeconomic History  . Marital status: Married    Spouse name: Not on file  . Number of children: Not on file  . Years of education: Not  on file  . Highest education level: Not on file  Occupational History  . Occupation: Banking    Comment: Engineer, manufacturing  Tobacco Use  . Smoking status: Former Smoker    Years: 5.00    Quit date: 06/17/1997    Years since quitting: 22.1  . Smokeless tobacco: Never Used  . Tobacco comment: started smoking at ahe 16, and quit at age 33  Substance and Sexual Activity  . Alcohol use: Yes    Alcohol/week: 0.0 standard drinks    Comment: occasional alcohol use  . Drug use: No  . Sexual activity: Not on file  Other Topics Concern  . Not on file  Social History Narrative  . Not on file   Social Determinants of Health   Financial Resource Strain:   . Difficulty of Paying Living Expenses: Not on file  Food Insecurity:   . Worried About Charity fundraiser in the Last Year: Not on file  . Ran Out of Food in the Last Year: Not on file  Transportation Needs:   . Lack of Transportation (Medical): Not on file  . Lack of Transportation (Non-Medical): Not on file  Physical Activity:   . Days of Exercise per Week: Not on file  . Minutes of Exercise per Session: Not on file  Stress:   . Feeling of Stress : Not on file  Social Connections:   . Frequency of Communication with Friends and Family: Not on file  . Frequency of Social Gatherings with Friends and Family: Not on file  . Attends Religious Services: Not on file  . Active Member of Clubs or Organizations: Not on file  . Attends Archivist Meetings: Not on file  . Marital Status: Not on file    Allergies:  Allergies  Allergen Reactions  . Bupropion     Extreme anxiety  . Morphine     Metabolic Disorder Labs: Lab Results  Component Value Date   HGBA1C 8.7 (H) 05/24/2019   No results found for: PROLACTIN Lab Results  Component Value Date   CHOL 173 05/24/2019   TRIG 718 (HH) 05/24/2019   HDL 30 (L) 05/24/2019   CHOLHDL 5.8 (H) 05/24/2019   LDLCALC 42 05/24/2019   [place] Comment 10/30/2018   Lab Results   Component Value Date   TSH 1.360 05/24/2019   TSH 1.570 01/24/2017    Therapeutic Level Labs: No results found for: LITHIUM No results found for: VALPROATE No components found for:  CBMZ  Current Medications: Current Outpatient Medications  Medication Sig Dispense Refill  . ALPRAZolam (XANAX) 0.5 MG tablet Take 1 tablet (0.5 mg total) by mouth every 4 (four) hours as needed. For anxiety 16 tablet 1  . dicyclomine (BENTYL) 20 MG tablet Take 1 tablet (20 mg total) by mouth every 6 (six) hours. For irritable bowel 120 tablet 1  . FARXIGA 10 MG TABS tablet TAKE 1 TABLET BY MOUTH DAILY (Patient taking  differently: Take 10 mg by mouth daily. ) 30 tablet 6  . fluconazole (DIFLUCAN) 150 MG tablet TAKE 1 TABLET BY MOUTH AS ONE DOSE 1 tablet 5  . glipiZIDE (GLUCOTROL XL) 10 MG 24 hr tablet TAKE 1 TABLET BY MOUTH DAILY WITH BREAKFAST. (Patient taking differently: Take 10 mg by mouth daily with breakfast. ) 90 tablet 4  . ketoconazole (NIZORAL) 2 % cream APPLY TO AFFECTED AREA ONCE DAILY FOR 7 DAYS AS NEEDED (Patient taking differently: Apply 1 application topically daily. APPLY TO AFFECTED AREA ONCE DAILY FOR 7 DAYS AS NEEDED) 15 g 3  . lamoTRIgine (LAMICTAL) 100 MG tablet Take 1 tablet (100 mg total) by mouth daily. For mood 90 tablet 0  . metFORMIN (GLUCOPHAGE-XR) 500 MG 24 hr tablet TAKE 2 TABLETS BY MOUTH TWICE A DAY (Patient taking differently: Take 1,000 mg by mouth 2 (two) times daily with a meal. ) 120 tablet 3  . omeprazole (PRILOSEC) 40 MG capsule TAKE 1 CAPSULE (40 MG TOTAL) BY MOUTH DAILY. 30 capsule 11  . ondansetron (ZOFRAN) 4 MG tablet Take 1 tablet (4 mg total) by mouth daily as needed for nausea or vomiting. 30 tablet 1  . oxyCODONE-acetaminophen (PERCOCET) 7.5-325 MG tablet Take 1 tablet by mouth every 4 (four) hours as needed. 60 tablet 0  . predniSONE (DELTASONE) 10 MG tablet 6 TABLETS FOR 2 DAYS, THEN 5 FOR 2 DAYS, THEN 4 FOR 2 DAYS, THEN 3 FOR 2 DAYS, THEN 2 FOR 2 DAYS, THEN 1  FOR 2 DAYS. 42 tablet 1  . promethazine (PHENERGAN) 25 MG tablet TAKE 1 TABLET (25 MG TOTAL) BY MOUTH EVERY 6 (SIX) HOURS AS NEEDED FOR NAUSEA OR VOMITING. 20 tablet 1  . QUEtiapine (SEROQUEL) 100 MG tablet Take 1 tablet (100 mg total) by mouth at bedtime. 90 tablet 0  . rosuvastatin (CRESTOR) 20 MG tablet Take 1 tablet (20 mg total) by mouth daily. 30 tablet 5  . sildenafil (VIAGRA) 50 MG tablet TAKE ONE OR TWO TABLETS AS NEEDED, NO MORE THAN 2 IN A DAY INSURANCE COVERS 6 TAB IN 25 DAYS 6 tablet 5  . TRULICITY 1.5 0000000 SOPN INJECT 1.5 MG INTO THE SKIN ONCE A WEEK. 4 pen 12  . Vilazodone HCl (VIIBRYD) 40 MG TABS Take 1 tablet (40 mg total) by mouth daily. 90 tablet 0   No current facility-administered medications for this visit.     Musculoskeletal: Strength & Muscle Tone: UTA Gait & Station: normal Patient leans: N/A  Psychiatric Specialty Exam: Review of Systems  Psychiatric/Behavioral: Positive for dysphoric mood and sleep disturbance.  All other systems reviewed and are negative.   There were no vitals taken for this visit.There is no height or weight on file to calculate BMI.  General Appearance: Casual  Eye Contact:  Fair  Speech:  Clear and Coherent  Volume:  Normal  Mood:  Depressed  Affect:  Appropriate  Thought Process:  Goal Directed and Descriptions of Associations: Intact  Orientation:  Full (Time, Place, and Person)  Thought Content: Logical   Suicidal Thoughts:  Reports chronic suicidal thoughts , denies active plans   Homicidal Thoughts:  No  Memory:  Immediate;   Fair Recent;   Fair Remote;   Fair  Judgement:  Fair  Insight:  Fair  Psychomotor Activity:  Normal  Concentration:  Concentration: Fair and Attention Span: Fair  Recall:  [employer] of Knowledge: Fair  Language: Fair  Akathisia:  No  Handed:  Right  AIMS (if  indicated):UTA  Assets:  Communication Skills Desire for Improvement Housing Social Support  ADL's:  Intact  Cognition: WNL   Sleep:  Restless   Screenings: GAD-7     Office Visit from 04/16/2019 in [place]  Total GAD-7 Score  20    PHQ2-9     Office Visit from 04/16/2019 in [place] Visit from 02/10/2018 in [place] Visit from 12/24/2016 in [place] Visit from 05/23/2016 in [place] Visit from 05/20/2016 in [place]  PHQ-2 Total Score  6  1  1   0  0  PHQ-9 Total Score  25  4  1   --  3       Assessment and Plan: Quoc is a 44 year old Caucasian male, employed, lives in [place], has a history of depression, anxiety, obstructive sleep apnea on CPAP, diabetes melitis, arthritis was evaluated by telemedicine today.  Patient is biologically predisposed given his history of trauma, family history of mental health problems.  Patient is currently struggling with worsening depressive symptoms and will benefit from possible referral to IOP/PHP as well as medication readjustment.  Plan as noted below.  Plan MDD-unstable Viibryd 40 mg p.o. daily Lamotrigine 100 mg p.o. daily Increase Seroquel to 100 mg p.o. nightly  GAD-some progress Continue psychotherapy sessions  Social anxiety disorder-improving Continue CBT  Bereavement-unstable Continue grief counseling with therapist  Will refer patient for IOP/PHP since patient is at this time presenting with worsening mood symptoms.  I have made a referral to Ms. Clark.  Follow-up in clinic in 3 weeks or sooner if needed.   I have spent atleast 30 minutes non face to face with patient today. More than 50 % of the time was spent for preparing to see the patient ( e.g., review of test, records ), obtaining and to review and separately obtained history , ordering medications and test ,psychoeducation and supportive psychotherapy and care coordination,as well as documenting clinical information in electronic health record. This note was  generated in part or whole with voice recognition software. Voice recognition is usually quite accurate but there are transcription errors that can and very often do occur. I apologize for any typographical errors that were not detected and corrected.       Ursula Alert, MD 08/10/2019, 2:20 PM

## 2019-08-10 NOTE — Telephone Encounter (Signed)
D:  Dr. Shea Evans referred pt to [place].  A:  Placed call to orient patient, but there was no answer.  Left vm for patient to call case manager back re: group.  Inform Dr. Shea Evans.

## 2019-08-11 ENCOUNTER — Telehealth (HOSPITAL_COMMUNITY): Payer: Self-pay | Admitting: Psychiatry

## 2019-08-11 MED ORDER — OXYCODONE-ACETAMINOPHEN 7.5-325 MG PO TABS: 1.0000 | ORAL_TABLET | ORAL | 0 refills | Status: DC | PRN

## 2019-08-11 NOTE — Telephone Encounter (Signed)
D:  Dr. Shea Evans referred pt to [place].  A:  Placed second call to pt this morning, but there was no answer.  Couldn't leave a vm because his mailbox is full.  Will inform Dr. Shea Evans.

## 2019-08-13 ENCOUNTER — Telehealth: Payer: Self-pay

## 2019-08-13 NOTE — Telephone Encounter (Signed)
Copied from [place]. Topic: General - Other >> Aug 13, 2019  8:48 AM Rayann Heman wrote: Reason for CRM: Roselyn Reef calling from [facility] long term disability would like a call from the office regarding pt and being out of work. Please advise

## 2019-08-14 ENCOUNTER — Other Ambulatory Visit: Payer: Self-pay | Admitting: Family Medicine

## 2019-08-15 NOTE — Telephone Encounter (Signed)
Requested medication (s) are due for refill today: yes  Requested medication (s) are on the active medication list: yes  Last refill:  alprazolam: 07/15/19   Prednisone:06/29/19  Future visit scheduled: yes  Notes to clinic:  both medications not delegated to NT to refill   Requested Prescriptions  Pending Prescriptions Disp Refills   ALPRAZolam (XANAX) 0.5 MG tablet [Pharmacy Med Name: ALPRAZOLAM 0.5 MG TABLET] 16 tablet 1    Sig: Take 1 tablet (0.5 mg total) by mouth every 4 (four) hours as needed. For anxiety      Not Delegated - Psychiatry:  Anxiolytics/Hypnotics Failed - 08/14/2019  3:04 PM      Failed - This refill cannot be delegated      Failed - Urine Drug Screen completed in last 360 days.      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   [facility] Birdie Sons, MD   2 months ago Type 2 diabetes mellitus without complication, without long-term current use of insulin ([place])   [facility] Birdie Sons, MD   4 months ago Depression, unspecified depression type   [facility] Birdie Sons, MD   4 months ago Nausea and vomiting, intractability of vomiting not specified, unspecified vomiting type   [place], [place], PA-C   5 months ago Type 2 diabetes mellitus without complication, without long-term current use of insulin ([place])   [facility] Birdie Sons, MD       Future Appointments             In 3 weeks Fisher, Kirstie Peri, MD [facility], [facility]              predniSONE (DELTASONE) 10 MG tablet [Pharmacy Med Name: PREDNISONE 10 MG TABLET] 42 tablet 1    Sig: 6 TABLETS FOR 2 DAYS, THEN 5 FOR 2 DAYS, THEN 4 FOR 2 DAYS, THEN 3 FOR 2 DAYS, THEN 2 FOR 2 DAYS, THEN 1 FOR 2 DAYS.      Not Delegated - Endocrinology:  Oral Corticosteroids Failed - 08/14/2019  3:04 PM     Failed - This refill cannot be delegated      Passed - Last BP in normal range    BP Readings from Last 1 Encounters:  07/05/19 (!) 108/58          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   [facility] Birdie Sons, MD   2 months ago Type 2 diabetes mellitus without complication, without long-term current use of insulin ([place])   [facility] Birdie Sons, MD   4 months ago Depression, unspecified depression type   [facility] Birdie Sons, MD   4 months ago Nausea and vomiting, intractability of vomiting not specified, unspecified vomiting type   [place], [place], PA-C   5 months ago Type 2 diabetes mellitus without complication, without long-term current use of insulin [facility])   [facility] Birdie Sons, MD       Future Appointments             In 3 weeks Fisher, Kirstie Peri, MD [facility], [facility]

## 2019-08-16 NOTE — Telephone Encounter (Signed)
I spoke with Roselyn Reef from [place]. She is trying to make a decision on patient's long term disability case. Roselyn Reef states that she is aware of patients Depression and Anxiety diagnosis, but she wants to confirm all the diagnoses that Dr. Caryn Section has for his disability. Roselyn Reef states the patient mentioned to her that he has Cyclic vomiting syndrome. Roselyn Reef states that the office note from 05/24/2019 mentions that he has chronic nausea and vomiting likely related to anxiety and depression. Roselyn Reef wants to know if Dr. 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    Jamie's call back: [phone]

## 2019-08-17 NOTE — Telephone Encounter (Signed)
Yes, the cyclic vomiting syndrome is contributing to his disability

## 2019-08-17 NOTE — Telephone Encounter (Signed)
Tried [employer]. Left message to call back. OK for [facility] to advise as below.

## 2019-08-19 ENCOUNTER — Other Ambulatory Visit: Payer: Self-pay | Admitting: Family Medicine

## 2019-08-19 NOTE — Telephone Encounter (Signed)
Tried [employer]. Her voice message  system states that she is out of the office from 08/18/2019 until 08/23/2019. Will try calling back after 08/23/2019 to follow up on this message.

## 2019-08-23 ENCOUNTER — Other Ambulatory Visit: Payer: Self-pay | Admitting: Family Medicine

## 2019-08-23 ENCOUNTER — Ambulatory Visit: Payer: Self-pay

## 2019-08-23 ENCOUNTER — Other Ambulatory Visit: Payer: Self-pay

## 2019-08-23 ENCOUNTER — Encounter: Payer: Self-pay | Admitting: Family Medicine

## 2019-08-23 ENCOUNTER — Ambulatory Visit (INDEPENDENT_AMBULATORY_CARE_PROVIDER_SITE_OTHER): Payer: 59 | Admitting: Family Medicine

## 2019-08-23 VITALS — BP 126/90 | HR 77 | Temp 96.2°F | Wt 332.8 lb

## 2019-08-23 DIAGNOSIS — F331 Major depressive disorder, recurrent, moderate: Secondary | ICD-10-CM

## 2019-08-23 DIAGNOSIS — E119 Type 2 diabetes mellitus without complications: Secondary | ICD-10-CM | POA: Diagnosis not present

## 2019-08-23 DIAGNOSIS — R809 Proteinuria, unspecified: Secondary | ICD-10-CM | POA: Insufficient documentation

## 2019-08-23 DIAGNOSIS — R197 Diarrhea, unspecified: Secondary | ICD-10-CM

## 2019-08-23 DIAGNOSIS — R112 Nausea with vomiting, unspecified: Secondary | ICD-10-CM | POA: Diagnosis not present

## 2019-08-23 DIAGNOSIS — K915 Postcholecystectomy syndrome: Secondary | ICD-10-CM

## 2019-08-23 DIAGNOSIS — F419 Anxiety disorder, unspecified: Secondary | ICD-10-CM

## 2019-08-23 LAB — POCT GLYCOSYLATED HEMOGLOBIN (HGB A1C)
Est. average glucose Bld gHb Est-mCnc: 180
Hemoglobin A1C: 7.9 % — AB (ref 4.0–5.6)

## 2019-08-23 LAB — POCT UA - MICROALBUMIN: Microalbumin Ur, POC: 50 mg/L

## 2019-08-23 MED ORDER — ALPRAZOLAM 0.5 MG PO TABS: 0.5000 mg | ORAL_TABLET | ORAL | 1 refills | Status: DC | PRN

## 2019-08-23 MED ORDER — OXYCODONE-ACETAMINOPHEN 7.5-325 MG PO TABS: 1.0000 | ORAL_TABLET | ORAL | 0 refills | Status: DC | PRN

## 2019-08-23 NOTE — Telephone Encounter (Signed)
Phone call disconnected

## 2019-08-23 NOTE — Telephone Encounter (Signed)
Requested Prescriptions  Pending Prescriptions Disp Refills  . dicyclomine (BENTYL) 20 MG tablet [Pharmacy Med Name: DICYCLOMINE 20 MG TABLET] 120 tablet 1    Sig: TAKE 1 TABLET (20 MG TOTAL) BY MOUTH EVERY 6 (SIX) HOURS. FOR IRRITABLE BOWEL     Gastroenterology:  Antispasmodic Agents Passed - 08/23/2019  1:40 AM      Passed - Last Heart Rate in normal range    Pulse Readings from Last 1 Encounters:  07/05/19 84         Passed - Valid encounter within last 12 months    Recent Outpatient Visits          2 months ago Post-cholecystectomy syndrome   [facility] Birdie Sons, MD   3 months ago Type 2 diabetes mellitus without complication, without long-term current use of insulin [facility])   [facility] Birdie Sons, MD   4 months ago Depression, unspecified depression type   [facility] Birdie Sons, MD   4 months ago Nausea and vomiting, intractability of vomiting not specified, unspecified vomiting type   [place], [place], PA-C   6 months ago Type 2 diabetes mellitus without complication, without long-term current use of insulin [facility])   [place], Kirstie Peri, MD      Future Appointments            Today Fisher, Kirstie Peri, MD [facility], [facility]

## 2019-08-23 NOTE — Telephone Encounter (Signed)
Jamie advised

## 2019-08-23 NOTE — Progress Notes (Addendum)
Patient: Raymond Rose Male    DOB: [birth year]   44 y.o.   MRN: [id number] Visit Date: 08/23/2019  Today's Provider: [name], MD   Chief Complaint  Patient presents with  . Diabetes  . Depression   Subjective:     HPI  Diabetes Mellitus Type II, Follow-up:   Recent Labs       Lab Results  Component Value Date   HGBA1C 8.7 (A) 02/17/2019   HGBA1C 8.9 (H) 10/30/2018   HGBA1C 9.3 (A) 06/08/2018      Last seen for diabetes 3 months ago.  Management since then includes continue Trulicity 0.75MG /0.5ML. He reports good compliance with treatment. He is not having side effects.  Current symptoms include none  Home blood sugar records: fasting range: 200's  Episodes of hypoglycemia? no              Current insulin regiment: Trulicity Most Recent Eye Exam: 09/06/2015 Weight trend: stable Prior visit with dietician: No Current exercise: no regular exercise just playing ball Current diet habits: well balanced  Pertinent Labs: Labs (Brief)          Component Value Date/Time   CHOL 188 10/30/2018 0915   TRIG 534 (H) 10/30/2018 0915   HDL 29 (L) 10/30/2018 0915   LDLCALC Comment 10/30/2018 0915   CREATININE 0.76 10/30/2018 0915   CREATININE 0.68 09/07/2014 1206   CREATININE 0.68 09/07/2014 1206         Wt Readings from Last 3 Encounters:  05/24/19 (!) 349 lb 3.2 oz (158.4 kg)  04/16/19 (!) 345 lb 3.2 oz (156.6 kg)  02/17/19 (!) 359 lb (162.8 kg)    ------------------------------------------------------------------------  Follow up for Depression:  The patient was last seen for this 3 months ago. Changes made at last visit include no change. Patient is followed by Dr. Shea Evans.  He reports good compliance with treatment. He feels that condition is Unchanged. He is not having side effects.   ------------------------------------------------------------------------------------  Allergies  Allergen Reactions  . Bupropion    Extreme anxiety  . Morphine      Current Outpatient Medications:  .  ALPRAZolam (XANAX) 0.5 MG tablet, Take 1 tablet (0.5 mg total) by mouth every 4 (four) hours as needed. For anxiety, Disp: 16 tablet, Rfl: 1 .  dicyclomine (BENTYL) 20 MG tablet, TAKE 1 TABLET (20 MG TOTAL) BY MOUTH EVERY 6 (SIX) HOURS. FOR IRRITABLE BOWEL, Disp: 120 tablet, Rfl: 1 .  FARXIGA 10 MG TABS tablet, TAKE 1 TABLET BY MOUTH DAILY (Patient taking differently: Take 10 mg by mouth daily. ), Disp: 30 tablet, Rfl: 6 .  fluconazole (DIFLUCAN) 150 MG tablet, TAKE 1 TABLET BY MOUTH AS ONE DOSE, Disp: 1 tablet, Rfl: 5 .  glipiZIDE (GLUCOTROL XL) 10 MG 24 hr tablet, TAKE 1 TABLET BY MOUTH DAILY WITH BREAKFAST. (Patient taking differently: Take 10 mg by mouth daily with breakfast. ), Disp: 90 tablet, Rfl: 4 .  ketoconazole (NIZORAL) 2 % cream, APPLY TO AFFECTED AREA ONCE DAILY FOR 7 DAYS AS NEEDED (Patient taking differently: Apply 1 application topically daily. APPLY TO AFFECTED AREA ONCE DAILY FOR 7 DAYS AS NEEDED), Disp: 15 g, Rfl: 3 .  lamoTRIgine (LAMICTAL) 100 MG tablet, Take 1 tablet (100 mg total) by mouth daily. For mood, Disp: 90 tablet, Rfl: 0 .  omeprazole (PRILOSEC) 40 MG capsule, TAKE 1 CAPSULE (40 MG TOTAL) BY MOUTH DAILY., Disp: 30 capsule, Rfl: 11 .  ondansetron (ZOFRAN) 4 MG tablet, Take  1 tablet (4 mg total) by mouth daily as needed for nausea or vomiting., Disp: 30 tablet, Rfl: 1 .  oxyCODONE-acetaminophen (PERCOCET) 7.5-325 MG tablet, Take 1 tablet by mouth every 4 (four) hours as needed., Disp: 60 tablet, Rfl: 0 .  predniSONE (DELTASONE) 10 MG tablet, 6 TABLETS FOR 2 DAYS, THEN 5 FOR 2 DAYS, THEN 4 FOR 2 DAYS, THEN 3 FOR 2 DAYS, THEN 2 FOR 2 DAYS, THEN 1 FOR 2 DAYS., Disp: 42 tablet, Rfl: 1 .  promethazine (PHENERGAN) 25 MG tablet, TAKE 1 TABLET (25 MG TOTAL) BY MOUTH EVERY 6 (SIX) HOURS AS NEEDED FOR NAUSEA OR VOMITING., Disp: 20 tablet, Rfl: 1 .  QUEtiapine (SEROQUEL) 100 MG tablet, Take 1 tablet (100 mg  total) by mouth at bedtime., Disp: 90 tablet, Rfl: 0 .  rosuvastatin (CRESTOR) 20 MG tablet, Take 1 tablet (20 mg total) by mouth daily., Disp: 30 tablet, Rfl: 5 .  sildenafil (VIAGRA) 50 MG tablet, TAKE ONE OR TWO TABLETS AS NEEDED, NO MORE THAN 2 IN A DAY INSURANCE COVERS 6 TAB IN 25 DAYS, Disp: 6 tablet, Rfl: 5 .  TRULICITY 1.5 0000000 SOPN, INJECT 1.5 MG INTO THE SKIN ONCE A WEEK., Disp: 4 pen, Rfl: 12 .  Vilazodone HCl (VIIBRYD) 40 MG TABS, Take 1 tablet (40 mg total) by mouth daily., Disp: 90 tablet, Rfl: 0 .  metFORMIN (GLUCOPHAGE-XR) 500 MG 24 hr tablet, TAKE 2 TABLETS BY MOUTH TWICE A DAY (Patient not taking: No sig reported), Disp: 120 tablet, Rfl: 3  Review of Systems  Constitutional: Negative.  Negative for appetite change, chills and fever.  Respiratory: Negative.  Negative for chest tightness, shortness of breath and wheezing.   Cardiovascular: Negative.  Negative for chest pain and palpitations.  Gastrointestinal: Negative for abdominal pain, nausea and vomiting.  Musculoskeletal: Negative.   Psychiatric/Behavioral:       Depression     Social History   Tobacco Use  . Smoking status: Former Smoker    Years: 5.00    Quit date: 06/17/1997    Years since quitting: 22.1  . Smokeless tobacco: Never Used  . Tobacco comment: started smoking at ahe 16, and quit at age 69  Substance Use Topics  . Alcohol use: Yes    Alcohol/week: 0.0 standard drinks    Comment: occasional alcohol use      Objective:   BP 126/90 (BP Location: Right Arm, Patient Position: Sitting, Cuff Size: Large)   Pulse 77   Temp (!) 96.2 F (35.7 C) (Temporal)   Wt (!) 332 lb 12.8 oz (151 kg)   BMI 43.91 kg/m  Vitals:   08/23/19 1106  BP: 126/90  Pulse: 77  Temp: (!) 96.2 F (35.7 C)  TempSrc: Temporal  Weight: (!) 332 lb 12.8 oz (151 kg)  Body mass index is 43.91 kg/m.   Physical Exam  General appearance: Severely obese male, cooperative and in no acute distress Head: Normocephalic,  without obvious abnormality, atraumatic Respiratory: Respirations even and unlabored, normal respiratory rate Extremities: All extremities are intact.  Skin: Skin color, texture, turgor normal. No rashes seen  Psych: Appropriate mood and affect. Neurologic: Mental status: Alert, oriented to person, place, and time, thought content appropriate.  Results for orders placed or performed in visit on 08/23/19  POCT HgB A1C  Result Value Ref Range   Hemoglobin A1C 7.9 (A) 4.0 - 5.6 %   Est. average glucose Bld gHb Est-mCnc 180   POCT UA - Microalbumin  Result Value Ref Range  Microalbumin Ur, POC 50 mg/L       Assessment & Plan    1. Type 2 diabetes mellitus without complication, without long-term current use of insulin (HCC) Improved with current meds and low carb diet. Continue current medications.  Follow up in 4-5 months.   2. Nausea vomiting and diarrhea Cyclic vomiting syndrome continues despite improvements in anxiety which is managed by Dr. Alejandro Mulling. Very slight improvement since stopping metformin, but still has sx nearly every day precluding him from working at this time. He is not expected to have any significant improvement until he is evaluated and treated at tertiary care specialty clinic at [facility] in mid June, and there is not expected to be able to return to work until at least the first of July 2021.    3. MDD (major depressive disorder), recurrent episode, moderate ([place]) Continues management by Dr. Luther Bradley  4. Anxiety Now only taking alprazolam a few times each week. Refilled today.   5. Microalbuminuria Consider starting ARB is continues.      The entirety of the information documented in the History of Present Illness, Review of Systems and Physical Exam were personally obtained by me. Portions of this information were initially documented by Idelle Jo, CMA and reviewed by me for thoroughness and accuracy.   [name], MD  [facility]

## 2019-08-31 ENCOUNTER — Other Ambulatory Visit: Payer: Self-pay | Admitting: Family Medicine

## 2019-08-31 NOTE — Telephone Encounter (Signed)
Requested medication (s) are due for refill today: yes  Requested medication (s) are on the active medication list: yes  Last refill:  06/29/19  Future visit scheduled: yes  Notes to clinic:  medication not delegated to NT to refill   Requested Prescriptions  Pending Prescriptions Disp Refills   predniSONE (DELTASONE) 10 MG tablet 42 tablet 1      Not Delegated - Endocrinology:  Oral Corticosteroids Failed - 08/31/2019 12:35 PM      Failed - This refill cannot be delegated      Failed - Last BP in normal range    BP Readings from Last 1 Encounters:  08/23/19 126/90          Passed - Valid encounter within last 6 months    Recent Outpatient Visits           1 week ago Type 2 diabetes mellitus without complication, without long-term current use of insulin ([place])   [facility] Birdie Sons, MD   2 months ago Post-cholecystectomy syndrome   [facility] Birdie Sons, MD   3 months ago Type 2 diabetes mellitus without complication, without long-term current use of insulin [facility])   [facility] Birdie Sons, MD   4 months ago Depression, unspecified depression type   [facility] Birdie Sons, MD   4 months ago Nausea and vomiting, intractability of vomiting not specified, unspecified vomiting type   [place], Wendee Beavers, [place]       Future Appointments             In 4 months Fisher, Kirstie Peri, MD [facility], [place]

## 2019-08-31 NOTE — Telephone Encounter (Signed)
Medication Refill - Medication:  predniSONE (DELTASONE) 10 MG tablet [id number]  Has the patient contacted their pharmacy? No. (Agent: If no, request that the patient contact the pharmacy for the refill.) (Agent: If yes, when and what did the pharmacy advise?)  Preferred Pharmacy (with phone number or street name[facility] [place]: Please be advised that RX refills may take up to 3 business days. We ask that you follow-up with your pharmacy.

## 2019-09-01 ENCOUNTER — Ambulatory Visit: Payer: 59 | Admitting: Psychiatry

## 2019-09-01 ENCOUNTER — Other Ambulatory Visit: Payer: Self-pay

## 2019-09-02 MED ORDER — PREDNISONE 10 MG PO TABS: ORAL_TABLET | ORAL | 0 refills | Status: DC

## 2019-09-06 ENCOUNTER — Other Ambulatory Visit: Payer: Self-pay | Admitting: Family Medicine

## 2019-09-06 MED ORDER — OXYCODONE-ACETAMINOPHEN 7.5-325 MG PO TABS: 1.0000 | ORAL_TABLET | ORAL | 0 refills | Status: DC | PRN

## 2019-09-07 ENCOUNTER — Ambulatory Visit: Payer: 59 | Admitting: Family Medicine

## 2019-09-09 ENCOUNTER — Ambulatory Visit: Payer: 59 | Attending: Internal Medicine

## 2019-09-09 DIAGNOSIS — Z23 Encounter for immunization: Secondary | ICD-10-CM

## 2019-09-09 NOTE — Progress Notes (Signed)
   Covid-19 Vaccination Clinic  Name:  Raymond Rose    MRN: [id number] DOB: [birth year]  09/09/2019  [name] was observed post Covid-19 immunization for 15 minutes without incident. He was provided with Vaccine Information Sheet and instruction to access the V-Safe system.   [name] was instructed to call 911 with any severe reactions post vaccine: Marland Kitchen Difficulty breathing  . Swelling of face and throat  . A fast heartbeat  . A bad rash all over body  . Dizziness and weakness   Immunizations Administered    Name Date Dose VIS Date Route   Pfizer COVID-19 Vaccine 09/09/2019  9:40 AM 0.3 mL 05/28/2019 Intramuscular   Manufacturer: [employer], [employer]   Lot: [id number]   [place]: [id number]

## 2019-09-14 ENCOUNTER — Telehealth: Payer: Self-pay | Admitting: Psychiatry

## 2019-09-14 NOTE — Telephone Encounter (Signed)
Contacted patient since I received [employer] disability claims behavioral health questionnaire.  Patient provided consent for writer to fill this form.  Completed form  given to the nurse-CMA Ms. Olin Hauser, to be faxed to the company.

## 2019-09-19 ENCOUNTER — Other Ambulatory Visit: Payer: Self-pay | Admitting: Family Medicine

## 2019-09-20 MED ORDER — OXYCODONE-ACETAMINOPHEN 7.5-325 MG PO TABS: 1.0000 | ORAL_TABLET | ORAL | 0 refills | Status: DC | PRN

## 2019-09-20 MED ORDER — PREDNISONE 10 MG PO TABS: ORAL_TABLET | ORAL | 0 refills | Status: DC

## 2019-09-23 ENCOUNTER — Encounter: Payer: Self-pay | Admitting: Psychiatry

## 2019-09-23 ENCOUNTER — Ambulatory Visit (INDEPENDENT_AMBULATORY_CARE_PROVIDER_SITE_OTHER): Payer: 59 | Admitting: Psychiatry

## 2019-09-23 ENCOUNTER — Other Ambulatory Visit: Payer: Self-pay

## 2019-09-23 DIAGNOSIS — F4010 Social phobia, unspecified: Secondary | ICD-10-CM | POA: Diagnosis not present

## 2019-09-23 DIAGNOSIS — F331 Major depressive disorder, recurrent, moderate: Secondary | ICD-10-CM | POA: Diagnosis not present

## 2019-09-23 DIAGNOSIS — F609 Personality disorder, unspecified: Secondary | ICD-10-CM | POA: Diagnosis not present

## 2019-09-23 DIAGNOSIS — F411 Generalized anxiety disorder: Secondary | ICD-10-CM | POA: Diagnosis not present

## 2019-09-23 MED ORDER — LAMOTRIGINE 25 MG PO TABS: 25.0000 mg | ORAL_TABLET | Freq: Every day | ORAL | 0 refills | Status: DC

## 2019-09-23 MED ORDER — LAMOTRIGINE 100 MG PO TABS: 100.0000 mg | ORAL_TABLET | Freq: Every day | ORAL | 0 refills | Status: DC

## 2019-09-23 MED ORDER — QUETIAPINE FUMARATE 100 MG PO TABS: 150.0000 mg | ORAL_TABLET | Freq: Every day | ORAL | 0 refills | Status: DC

## 2019-09-23 NOTE — Progress Notes (Signed)
Provider Location : [facility] Patient Location : Home  Virtual Visit via Video Note  I connected with Raymond Rose on 09/23/19 at  9:30 AM EDT by a video enabled telemedicine application and verified that I am speaking with the correct person using two identifiers.   I discussed the limitations of evaluation and management by telemedicine and the availability of in person appointments. The patient expressed understanding and agreed to proceed.    I discussed the assessment and treatment plan with the patient. The patient was provided an opportunity to ask questions and all were answered. The patient agreed with the plan and demonstrated an understanding of the instructions.   The patient was advised to call back or seek an in-person evaluation if the symptoms worsen or if the condition fails to improve as anticipated.   [place] MD OP Progress Note  09/23/2019 10:58 AM NARAIN WAUGAMAN  MRN:  [id number]  Chief Complaint:  Chief Complaint    Follow-up     HPI: Raymond Rose is a 44 year old Caucasian male, employed, married, lives in [place], has a history of depression, GAD, social anxiety disorder, diabetes melitis, cyclical vomiting, sleep apnea, erectile dysfunction, arthritis was evaluated by telemedicine today.  A video call was initiated however due to connection problem it had to be changed to a phone call.  Patient today reports last week he was feeling very depressed.  He does not know what the trigger was.  He however reports this week has been better.  He reports he continues to feel sad on and off.  He also reports having negative thoughts and he has fleeting suicidal thoughts however he does not dwell on these thoughts and does not have any plans.  He reports he is compliant on medications as prescribed.  He denies side effects.  Since starting the higher dosage of Seroquel his sleep has been better.  He however does not have a good sleep hygiene and goes to bed late however he is able to  sleep in the next day.  Patient reports he is currently in psychotherapy sessions with Ms. Miguel Dibble and that has been beneficial.  He is not interested in intensive outpatient program or PHP program at this time since he believes he is socially anxious and will not do well in group situations.  Patient continues to have on and off cyclical vomiting and is currently awaiting a second opinion.  His appointment with gastroenterologist is  in May.  Collateral information was obtained from wife-[name] who reported that she is always there to support him.  He comes and talks to her when he has negative thoughts and she will get him help if he has any suicidal thoughts again.  She reports she has definitely noticed improvement on the current medication regimen however he continues to have some days when he feels down and depressed however he is able to come out of that quickly than before.   Visit Diagnosis:    ICD-10-CM   1. MDD (major depressive disorder), recurrent episode, moderate (HCC)  F33.1 QUEtiapine (SEROQUEL) 100 MG tablet    lamoTRIgine (LAMICTAL) 100 MG tablet    lamoTRIgine (LAMICTAL) 25 MG tablet   Some progress  2. GAD (generalized anxiety disorder)  F41.1 QUEtiapine (SEROQUEL) 100 MG tablet    lamoTRIgine (LAMICTAL) 25 MG tablet  3. Social anxiety disorder  F40.10   4. Personality disorder, unspecified ([place])  F60.9 lamoTRIgine (LAMICTAL) 100 MG tablet    Past Psychiatric History: I have reviewed past  psychiatric history from my progress note on 04/21/2019.  Past trials of Wellbutrin, Lexapro, Elavil, Effexor  Past Medical History:  Past Medical History:  Diagnosis Date  . Anxiety   . Arthritis   . Depression   . Diabetes mellitus, type II ([place])   . OSA on CPAP   . Vertigo 01/03/2015    Past Surgical History:  Procedure Laterality Date  . 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 CHOLECYSTECTOMY  2010   lap Cholecystectomy  . 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 CT scan of Brain  12/23/2011   without contrast, [facility], Normal  . TONSILLECTOMY  1985    Family Psychiatric History: I have reviewed family psychiatric history from my progress note on 04/21/2019  Family History:  Family History  Problem Relation Age of Onset  . Depression Mother   . Heart attack Father 46  . Arthritis Other   . Alcohol abuse Other   . Lung cancer Other   . Hyperlipidemia Other   . CAD Other   . Stroke Other   . Hypertension Other   . Bipolar disorder Other     Social History: I have reviewed social history from my progress note on 04/21/2019 Social History   Socioeconomic History  . Marital status: Married    Spouse name: Not on file  . Number of children: Not on file  . Years of education: Not on file  . Highest education level: Not on file  Occupational History  . Occupation: Banking    Comment: Engineer, manufacturing  Tobacco Use  . Smoking status: Former Smoker    Years: 5.00    Quit date: 06/17/1997    Years since quitting: 22.2  . Smokeless tobacco: Never Used  . Tobacco comment: started smoking at ahe 16, and quit at age 66  Substance and Sexual Activity  . Alcohol use: Yes    Alcohol/week: 0.0 standard drinks    Comment: occasional alcohol use  . Drug use: No  . Sexual activity: Not on file  Other Topics Concern  . Not on file  Social History Narrative  . Not on file   Social Determinants of Health   Financial Resource Strain:   . Difficulty of Paying Living Expenses:   Food Insecurity:   . Worried About Charity fundraiser in the Last Year:   . Arboriculturist in the Last Year:   Transportation Needs:   . Film/video editor (Medical):   Marland Kitchen Lack of Transportation (Non-Medical):   Physical Activity:   . Days of Exercise per Week:   . Minutes of Exercise per Session:   Stress:   . Feeling of Stress :   Social Connections:   . Frequency of  Communication with Friends and Family:   . Frequency of Social Gatherings with Friends and Family:   . Attends Religious Services:   . Active Member of Clubs or Organizations:   . Attends Archivist Meetings:   Marland Kitchen Marital Status:     Allergies:  Allergies  Allergen Reactions  . Bupropion     Extreme anxiety  . Morphine     Metabolic Disorder Labs: Lab Results  Component Value Date   HGBA1C 7.9 (A) 08/23/2019   No results found for: PROLACTIN Lab Results  Component Value Date   CHOL 173 05/24/2019   TRIG 718 (  HH) 05/24/2019   HDL 30 (L) 05/24/2019   CHOLHDL 5.8 (H) 05/24/2019   LDLCALC 42 05/24/2019   LDLCALC Comment 10/30/2018   Lab Results  Component Value Date   TSH 1.360 05/24/2019   TSH 1.570 01/24/2017    Therapeutic Level Labs: No results found for: LITHIUM No results found for: VALPROATE No components found for:  CBMZ  Current Medications: Current Outpatient Medications  Medication Sig Dispense Refill  . ALPRAZolam (XANAX) 0.5 MG tablet Take 1 tablet (0.5 mg total) by mouth every 4 (four) hours as needed. For anxiety 16 tablet 1  . dicyclomine (BENTYL) 20 MG tablet TAKE 1 TABLET (20 MG TOTAL) BY MOUTH EVERY 6 (SIX) HOURS. FOR IRRITABLE BOWEL 120 tablet 1  . FARXIGA 10 MG TABS tablet TAKE 1 TABLET BY MOUTH DAILY (Patient taking differently: Take 10 mg by mouth daily. ) 30 tablet 6  . fluconazole (DIFLUCAN) 150 MG tablet TAKE 1 TABLET BY MOUTH AS ONE DOSE 1 tablet 5  . glipiZIDE (GLUCOTROL XL) 10 MG 24 hr tablet TAKE 1 TABLET BY MOUTH DAILY WITH BREAKFAST. (Patient taking differently: Take 10 mg by mouth daily with breakfast. ) 90 tablet 4  . ketoconazole (NIZORAL) 2 % cream APPLY TO AFFECTED AREA ONCE DAILY FOR 7 DAYS AS NEEDED (Patient taking differently: Apply 1 application topically daily. APPLY TO AFFECTED AREA ONCE DAILY FOR 7 DAYS AS NEEDED) 15 g 3  . lamoTRIgine (LAMICTAL) 100 MG tablet Take 1 tablet (100 mg total) by mouth daily. To be  combined with 25 mg 90 tablet 0  . lamoTRIgine (LAMICTAL) 25 MG tablet Take 1 tablet (25 mg total) by mouth daily. To be combined with 100 mg 90 tablet 0  . omeprazole (PRILOSEC) 40 MG capsule TAKE 1 CAPSULE (40 MG TOTAL) BY MOUTH DAILY. 30 capsule 11  . ondansetron (ZOFRAN) 4 MG tablet Take 1 tablet (4 mg total) by mouth daily as needed for nausea or vomiting. 30 tablet 1  . oxyCODONE-acetaminophen (PERCOCET) 7.5-325 MG tablet Take 1 tablet by mouth every 4 (four) hours as needed. 60 tablet 0  . predniSONE (DELTASONE) 10 MG tablet 6 TABLETS FOR 2 DAYS, THEN 5 FOR 2 DAYS, THEN 4 FOR 2 DAYS, THEN 3 FOR 2 DAYS, THEN 2 FOR 2 DAYS, THEN 1 FOR 2 DAYS. 42 tablet 0  . promethazine (PHENERGAN) 25 MG tablet TAKE 1 TABLET (25 MG TOTAL) BY MOUTH EVERY 6 (SIX) HOURS AS NEEDED FOR NAUSEA OR VOMITING. 20 tablet 1  . QUEtiapine (SEROQUEL) 100 MG tablet Take 1.5 tablets (150 mg total) by mouth at bedtime. 135 tablet 0  . rosuvastatin (CRESTOR) 20 MG tablet Take 1 tablet (20 mg total) by mouth daily. 30 tablet 5  . sildenafil (VIAGRA) 50 MG tablet TAKE ONE OR TWO TABLETS AS NEEDED, NO MORE THAN 2 IN A DAY INSURANCE COVERS 6 TAB IN 25 DAYS 6 tablet 5  . TRULICITY 1.5 0000000 SOPN INJECT 1.5 MG INTO THE SKIN ONCE A WEEK. 4 pen 12  . Vilazodone HCl (VIIBRYD) 40 MG TABS Take 1 tablet (40 mg total) by mouth daily. 90 tablet 0   No current facility-administered medications for this visit.     Musculoskeletal: Strength & Muscle Tone: UTA Gait & Station: UTA Patient leans: N/A  Psychiatric Specialty Exam: Review of Systems  Gastrointestinal:       Has vomiting on and off   Psychiatric/Behavioral: Positive for dysphoric mood.  All other systems reviewed and are negative.   There were  no vitals taken for this visit.There is no height or weight on file to calculate BMI.  General Appearance: UTA  Eye Contact:  UTA  Speech:  Clear and Coherent  Volume:  Normal  Mood:  Depressed  Affect:  UTA  Thought Process:   Goal Directed and Descriptions of Associations: Intact  Orientation:  Full (Time, Place, and Person)  Thought Content: Logical   Suicidal Thoughts:  No however does have fleeting suicidal thoughts on and off, denies plan  Homicidal Thoughts:  No  Memory:  Immediate;   Fair Recent;   Fair Remote;   Fair  Judgement:  Fair  Insight:  Fair  Psychomotor Activity:  UTA  Concentration:  Concentration: Fair and Attention Span: Fair  Recall:  [employer] of Knowledge: Fair  Language: Fair  Akathisia:  No  Handed:  Right  AIMS (if indicated): UTA  Assets:  Communication Skills Desire for Improvement Housing Social Support  ADL's:  Intact  Cognition: WNL  Sleep:  Improving   Screenings: GAD-7     Office Visit from 04/16/2019 in [place]  Total GAD-7 Score  20    PHQ2-9     Office Visit from 08/23/2019 in [place] Visit from 04/16/2019 in [place] Visit from 02/10/2018 in [place] Visit from 12/24/2016 in [place] Visit from 05/23/2016 in [place]  PHQ-2 Total Score  6  6  1  1   0  PHQ-9 Total Score  23  25  4  1   --       Assessment and Plan: Tannen is a 44 year old Caucasian male, employed, lives in [place] has a history of depression, anxiety, obstructive sleep apnea on CPAP, diabetes melitis, arthritis was evaluated by telemedicine today.  Patient is biologically predisposed given his history of trauma, family history of mental health problems.  Patient is currently struggling with depressive symptoms although making progress.  Plan as noted below.  Plan MDD-some progress Viibryd 40 mg p.o. daily Increase lamotrigine to 125 mg p.o. daily Increase Seroquel to 150 mg p.o. nightly  GAD-improving Continue psychotherapy sessions Continue Seroquel as prescribed  Social anxiety disorder-improving Continue CBT  Bereavement-improving Continue  psychotherapy  Collateral information was obtained from wife-[name] as summarized above  Patient declines IOP/PHP and wants to continue to follow-up with his therapist Ms. Miguel Dibble  I have coordinated care with Ms. Miguel Dibble who reports she will continue to work with him, she believes overall he is making progress although he continues to have ups and downs.  Crisis plan discussed with patient as well as wife.  Follow-up in clinic in 3 weeks or sooner if needed.  I have spent atleast 20 minutes non face to face with patient today. More than 50 % of the time was spent for preparing to see the patient ( e.g., review of test, records ), obtaining and to review and separately obtained history , ordering medications and test ,psychoeducation and supportive psychotherapy and care coordination,as well as documenting clinical information in electronic health record. This note was generated in part or whole with voice recognition software. Voice recognition is usually quite accurate but there are transcription errors that can and very often do occur. I apologize for any typographical errors that were not detected and corrected.         Ursula Alert, MD 09/23/2019, 10:58 AM

## 2019-09-30 NOTE — Telephone Encounter (Signed)
Done

## 2019-10-01 ENCOUNTER — Other Ambulatory Visit: Payer: Self-pay | Admitting: Family Medicine

## 2019-10-01 DIAGNOSIS — E119 Type 2 diabetes mellitus without complications: Secondary | ICD-10-CM

## 2019-10-04 ENCOUNTER — Other Ambulatory Visit: Payer: Self-pay | Admitting: Family Medicine

## 2019-10-04 NOTE — Progress Notes (Deleted)
    Established patient visit    Patient: Raymond Rose   DOB: Nov 06, 1975   44 y.o. Male  MRN: [id number] Visit Date: 10/04/2019  Today's healthcare provider: [name], MD   No chief complaint on file.  Subjective    HPI Cyclic vomiting  {Show patient history (optional):23778::" "}   Medications: Outpatient Medications Prior to Visit  Medication Sig  . ALPRAZolam (XANAX) 0.5 MG tablet Take 1 tablet (0.5 mg total) by mouth every 4 (four) hours as needed. For anxiety  . dicyclomine (BENTYL) 20 MG tablet TAKE 1 TABLET (20 MG TOTAL) BY MOUTH EVERY 6 (SIX) HOURS. FOR IRRITABLE BOWEL  . FARXIGA 10 MG TABS tablet TAKE 1 TABLET BY MOUTH DAILY  . fluconazole (DIFLUCAN) 150 MG tablet TAKE 1 TABLET BY MOUTH AS ONE DOSE  . glipiZIDE (GLUCOTROL XL) 10 MG 24 hr tablet TAKE 1 TABLET BY MOUTH DAILY WITH BREAKFAST. (Patient taking differently: Take 10 mg by mouth daily with breakfast. )  . ketoconazole (NIZORAL) 2 % cream APPLY TO AFFECTED AREA ONCE DAILY FOR 7 DAYS AS NEEDED (Patient taking differently: Apply 1 application topically daily. APPLY TO AFFECTED AREA ONCE DAILY FOR 7 DAYS AS NEEDED)  . lamoTRIgine (LAMICTAL) 100 MG tablet Take 1 tablet (100 mg total) by mouth daily. To be combined with 25 mg  . lamoTRIgine (LAMICTAL) 25 MG tablet Take 1 tablet (25 mg total) by mouth daily. To be combined with 100 mg  . omeprazole (PRILOSEC) 40 MG capsule TAKE 1 CAPSULE (40 MG TOTAL) BY MOUTH DAILY.  Marland Kitchen ondansetron (ZOFRAN) 4 MG tablet Take 1 tablet (4 mg total) by mouth daily as needed for nausea or vomiting.  Marland Kitchen oxyCODONE-acetaminophen (PERCOCET) 7.5-325 MG tablet Take 1 tablet by mouth every 4 (four) hours as needed.  . predniSONE (DELTASONE) 10 MG tablet 6 TABLETS FOR 2 DAYS, THEN 5 FOR 2 DAYS, THEN 4 FOR 2 DAYS, THEN 3 FOR 2 DAYS, THEN 2 FOR 2 DAYS, THEN 1 FOR 2 DAYS.  Marland Kitchen promethazine (PHENERGAN) 25 MG tablet TAKE 1 TABLET (25 MG TOTAL) BY MOUTH EVERY 6 (SIX) HOURS AS NEEDED FOR NAUSEA OR  VOMITING.  . QUEtiapine (SEROQUEL) 100 MG tablet Take 1.5 tablets (150 mg total) by mouth at bedtime.  . rosuvastatin (CRESTOR) 20 MG tablet Take 1 tablet (20 mg total) by mouth daily.  . sildenafil (VIAGRA) 50 MG tablet TAKE ONE OR TWO TABLETS AS NEEDED, NO MORE THAN 2 IN A DAY INSURANCE COVERS 6 TAB IN 25 DAYS  . TRULICITY 1.5 0000000 SOPN INJECT 1.5 MG INTO THE SKIN ONCE A WEEK.  . Vilazodone HCl (VIIBRYD) 40 MG TABS Take 1 tablet (40 mg total) by mouth daily.   No facility-administered medications prior to visit.    Review of Systems  {Show previous labs (optional):23779::" "}   Objective    There were no vitals taken for this visit. {Show previous vital signs (optional):23777::" "}  Physical Exam  ***  No results found for any visits on 10/08/19.   Assessment & Plan    ***  No follow-ups on file.      {provider attestation***:1}   [name], MD  [facility] [phone] (phone) [phone] (fax)  [place]

## 2019-10-05 ENCOUNTER — Ambulatory Visit: Payer: 59 | Attending: Internal Medicine

## 2019-10-05 DIAGNOSIS — Z23 Encounter for immunization: Secondary | ICD-10-CM

## 2019-10-05 MED ORDER — OXYCODONE-ACETAMINOPHEN 7.5-325 MG PO TABS: 1.0000 | ORAL_TABLET | ORAL | 0 refills | Status: DC | PRN

## 2019-10-05 NOTE — Progress Notes (Signed)
Virtual telephone visit    Virtual Visit via Telephone Note   This visit type was conducted due to national recommendations for restrictions regarding the COVID-19 Pandemic (e.g. social distancing) in an effort to limit this patient's exposure and mitigate transmission in our community. Due to his co-morbid illnesses, this patient is at least at moderate risk for complications without adequate follow up. This format is felt to be most appropriate for this patient at this time. The patient did not have access to video technology or had technical difficulties with video requiring transitioning to audio format only (telephone). Physical exam was limited to content and character of the telephone converstion.    Patient location: home Provider location: [facility]   Patient: Raymond Rose   DOB: 11-08-1975   44 y.o. Male  MRN: [id number] Visit Date: 10/08/2019  Today's Provider: [name], MD  Subjective:    Chief Complaint  Patient presents with  . Follow-up   HPI Follow up for Cyclic Vomiting:  The patient was last seen for this 1 months ago. Changes made at last visit include none.  From [id number]: Cyclic vomiting syndrome continues despite improvements in anxiety which is managed by Dr. Alejandro Mulling. Very slight improvement since stopping metformin, but still has sx nearly every day precluding him from working at this time.  He reports good compliance with treatment. He feels that condition is Unchanged. He reports having 4-5 episodes of cyclic vomiting per week. Patient has an appointment to see a specialist at [facility] in June. He is requesting an extension of his disability until he is able to see a specialist.  He is not having side effects.   Remains out of work since October. Continues to see Eappan and states that anxiety has is now under control, but GI symptoms not improving.  He has seen local GI and referred to [facility] where he has appt scheduled in mid June. No medications helps  with nausea or vomiting.       Medications: Outpatient Medications Prior to Visit  Medication Sig  . ALPRAZolam (XANAX) 0.5 MG tablet Take 1 tablet (0.5 mg total) by mouth every 4 (four) hours as needed. For anxiety  . dicyclomine (BENTYL) 20 MG tablet TAKE 1 TABLET (20 MG TOTAL) BY MOUTH EVERY 6 (SIX) HOURS. FOR IRRITABLE BOWEL  . FARXIGA 10 MG TABS tablet TAKE 1 TABLET BY MOUTH DAILY  . fluconazole (DIFLUCAN) 150 MG tablet TAKE 1 TABLET BY MOUTH AS ONE DOSE  . glipiZIDE (GLUCOTROL XL) 10 MG 24 hr tablet TAKE 1 TABLET BY MOUTH DAILY WITH BREAKFAST. (Patient taking differently: Take 10 mg by mouth daily with breakfast. )  . ketoconazole (NIZORAL) 2 % cream APPLY TO AFFECTED AREA ONCE DAILY FOR 7 DAYS AS NEEDED (Patient taking differently: Apply 1 application topically daily. APPLY TO AFFECTED AREA ONCE DAILY FOR 7 DAYS AS NEEDED)  . lamoTRIgine (LAMICTAL) 100 MG tablet Take 1 tablet (100 mg total) by mouth daily. To be combined with 25 mg  . lamoTRIgine (LAMICTAL) 25 MG tablet Take 1 tablet (25 mg total) by mouth daily. To be combined with 100 mg  . omeprazole (PRILOSEC) 40 MG capsule TAKE 1 CAPSULE (40 MG TOTAL) BY MOUTH DAILY.  Marland Kitchen ondansetron (ZOFRAN) 4 MG tablet Take 1 tablet (4 mg total) by mouth daily as needed for nausea or vomiting.  Marland Kitchen oxyCODONE-acetaminophen (PERCOCET) 7.5-325 MG tablet Take 1 tablet by mouth every 4 (four) hours as needed.  . promethazine (PHENERGAN) 25 MG tablet  TAKE 1 TABLET (25 MG TOTAL) BY MOUTH EVERY 6 (SIX) HOURS AS NEEDED FOR NAUSEA OR VOMITING.  . QUEtiapine (SEROQUEL) 100 MG tablet Take 1.5 tablets (150 mg total) by mouth at bedtime.  . rosuvastatin (CRESTOR) 20 MG tablet Take 1 tablet (20 mg total) by mouth daily.  . sildenafil (VIAGRA) 50 MG tablet TAKE ONE OR TWO TABLETS AS NEEDED, NO MORE THAN 2 IN A DAY INSURANCE COVERS 6 TAB IN 25 DAYS  . TRULICITY 1.5 0000000 SOPN INJECT 1.5 MG INTO THE SKIN ONCE A WEEK.  . Vilazodone HCl (VIIBRYD) 40 MG TABS Take 1  tablet (40 mg total) by mouth daily.  . [DISCONTINUED] predniSONE (DELTASONE) 10 MG tablet 6 TABLETS FOR 2 DAYS, THEN 5 FOR 2 DAYS, THEN 4 FOR 2 DAYS, THEN 3 FOR 2 DAYS, THEN 2 FOR 2 DAYS, THEN 1 FOR 2 DAYS. (Patient not taking: Reported on 10/08/2019)   No facility-administered medications prior to visit.    Review of Systems  Constitutional: Negative for appetite change, chills and fever.  Respiratory: Negative for chest tightness, shortness of breath and wheezing.   Cardiovascular: Negative for chest pain and palpitations.  Gastrointestinal: Negative for abdominal pain, nausea and vomiting.        Objective:    There were no vitals taken for this visit.   Awake, alert, oriented x 3. In no apparent distress     Assessment & Plan:    1. Nausea vomiting and diarrhea   2. Cyclic vomiting syndrome  Symptoms persist despite control of anxiety and depression. Still having symptoms nearly every day precluding him from working at this time. He is not expected to have any significant improvement until he is evaluated and treated at tertiary care specialty clinic at [facility] in mid June, and there is not expected to be able to return to work    No follow-ups on file.    I discussed the assessment and treatment plan with the patient. The patient was provided an opportunity to ask questions and all were answered. The patient agreed with the plan and demonstrated an understanding of the instructions.   The patient was advised to call back or seek an in-person evaluation if the symptoms worsen or if the condition fails to improve as anticipated.  I provided  8 minutes of non-face-to-face time during this encounter.  The entirety of the information documented in the History of Present Illness, Review of Systems and Physical Exam were personally obtained by me. Portions of this information were initially documented by the CMA and reviewed by me for thoroughness and accuracy.      [name], MD [facility] [phone] (phone) [phone] (fax)  [place]

## 2019-10-05 NOTE — Progress Notes (Signed)
   Covid-19 Vaccination Clinic  Name:  JEREMIE STEENBERG    MRN: [id number] DOB: 1975-12-17  10/05/2019  Mr. Rizzi was observed post Covid-19 immunization for 15 minutes without incident. He was provided with Vaccine Information Sheet and instruction to access the V-Safe system.   Mr. Theiss was instructed to call 911 with any severe reactions post vaccine: Marland Kitchen Difficulty breathing  . Swelling of face and throat  . A fast heartbeat  . A bad rash all over body  . Dizziness and weakness   Immunizations Administered    Name Date Dose VIS Date Route   Pfizer COVID-19 Vaccine 10/05/2019  9:37 AM 0.3 mL 08/11/2018 Intramuscular   Manufacturer: [employer], [employer]   Lot: [id number]   [place]: [id number]

## 2019-10-08 ENCOUNTER — Ambulatory Visit: Payer: 59 | Admitting: Family Medicine

## 2019-10-08 ENCOUNTER — Other Ambulatory Visit: Payer: Self-pay

## 2019-10-08 ENCOUNTER — Telehealth (INDEPENDENT_AMBULATORY_CARE_PROVIDER_SITE_OTHER): Payer: 59 | Admitting: Family Medicine

## 2019-10-08 DIAGNOSIS — R197 Diarrhea, unspecified: Secondary | ICD-10-CM | POA: Diagnosis not present

## 2019-10-08 DIAGNOSIS — R1115 Cyclical vomiting syndrome unrelated to migraine: Secondary | ICD-10-CM

## 2019-10-08 DIAGNOSIS — R112 Nausea with vomiting, unspecified: Secondary | ICD-10-CM

## 2019-10-13 ENCOUNTER — Telehealth (INDEPENDENT_AMBULATORY_CARE_PROVIDER_SITE_OTHER): Payer: 59 | Admitting: Psychiatry

## 2019-10-13 ENCOUNTER — Other Ambulatory Visit: Payer: Self-pay

## 2019-10-13 ENCOUNTER — Encounter: Payer: Self-pay | Admitting: Psychiatry

## 2019-10-13 ENCOUNTER — Other Ambulatory Visit: Payer: Self-pay | Admitting: Family Medicine

## 2019-10-13 DIAGNOSIS — F3341 Major depressive disorder, recurrent, in partial remission: Secondary | ICD-10-CM

## 2019-10-13 DIAGNOSIS — F609 Personality disorder, unspecified: Secondary | ICD-10-CM | POA: Diagnosis not present

## 2019-10-13 DIAGNOSIS — F411 Generalized anxiety disorder: Secondary | ICD-10-CM

## 2019-10-13 DIAGNOSIS — F4010 Social phobia, unspecified: Secondary | ICD-10-CM

## 2019-10-13 MED ORDER — VILAZODONE HCL 40 MG PO TABS: 40.0000 mg | ORAL_TABLET | Freq: Every day | ORAL | 0 refills | Status: DC

## 2019-10-13 MED ORDER — LAMOTRIGINE 150 MG PO TABS: 150.0000 mg | ORAL_TABLET | Freq: Every day | ORAL | 0 refills | Status: DC

## 2019-10-13 NOTE — Progress Notes (Signed)
Provider Location : [facility] Patient Location : Home  Virtual Visit via Video Note  I connected with Raymond Rose on 10/13/19 at  2:30 PM EDT by a video enabled telemedicine application and verified that I am speaking with the correct person using two identifiers.   I discussed the limitations of evaluation and management by telemedicine and the availability of in person appointments. The patient expressed understanding and agreed to proceed.   I discussed the assessment and treatment plan with the patient. The patient was provided an opportunity to ask questions and all were answered. The patient agreed with the plan and demonstrated an understanding of the instructions.   The patient was advised to call back or seek an in-person evaluation if the symptoms worsen or if the condition fails to improve as anticipated.   [place] MD OP Progress Note  10/13/2019 3:36 PM Raymond Rose  MRN:  [id number]  Chief Complaint:  Chief Complaint    Follow-up     HPI: Raymond Rose is a 44 year old Caucasian male, unemployed, married, lives in [place], has a history of depression, GAD, social anxiety disorder, diabetes melitis, cyclical vomiting, sleep apnea, erectile dysfunction, arthritis was evaluated by telemedicine today.  Patient today reports he has noticed improvement in his mood symptoms the past few weeks.  He reports his sadness and lack of motivation has improved.  He reports his medications is effective.  Patient reports sleep is good. He does report fatigue however he does not know if it is due to the depression versus cyclical vomiting.  He continues to struggle with that.  He does have upcoming appointment with GI specialist for the same.  Patient reports he is compliant on his medications as prescribed.  He has noticed grogginess on the Seroquel when he wakes up in the morning.  He takes the Seroquel around 9 PM every day.  He goes to bed around 11 PM.  Patient denies any suicidality at this  time.  He does have a history of chronic suicidal thoughts which is always there which are passive.  Patient however currently denies any active thoughts or plans.  Patient reports his wife continues to be supportive.  He is currently unemployed.  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  His company shut down few weeks ago.  He however reports he is currently not going to look for a job and is planning to apply for a job may be in the summer once he takes care of his GI problems.  Patient continues to follow-up with Ms. Miguel Dibble and reports therapy sessions as beneficial.  He missed his last appointment however reports he has scheduled another appointment which is coming up. Visit Diagnosis:    ICD-10-CM   1. MDD (major depressive disorder), recurrent, in partial remission (HCC)  F33.41 Vilazodone HCl (VIIBRYD) 40 MG TABS    lamoTRIgine (LAMICTAL) 150 MG tablet  2. GAD (generalized anxiety disorder)  F41.1   3. Social anxiety disorder  F40.10   4. Personality disorder, unspecified ([place])  F60.9     Past Psychiatric History: I have reviewed past psychiatric history from my progress note on 04/21/2019.  Past trials of Wellbutrin, Lexapro, Elavil, Effexor.  Past Medical History:  Past Medical History:  Diagnosis Date  . Anxiety   . Arthritis   . Depression   . Diabetes mellitus, type II ([place])   . OSA on CPAP   . Vertigo 01/03/2015  Past Surgical History:  Procedure Laterality Date  . 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 CHOLECYSTECTOMY  2010   lap Cholecystectomy  . 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 CT scan of Brain  12/23/2011   without contrast, [facility], Normal  . TONSILLECTOMY  1985    Family Psychiatric History: I have reviewed family psychiatric history from my progress note on 04/21/2019.  Family History:   Family History  Problem Relation Age of Onset  . Depression Mother   . Heart attack Father 59  . Arthritis Other   . Alcohol abuse Other   . Lung cancer Other   . Hyperlipidemia Other   . CAD Other   . Stroke Other   . Hypertension Other   . Bipolar disorder Other     Social History: I have reviewed social history from my progress note on 04/21/2019. Social History   Socioeconomic History  . Marital status: Married    Spouse name: Not on file  . Number of children: Not on file  . Years of education: Not on file  . Highest education level: Not on file  Occupational History  . Occupation: Banking    Comment: Engineer, manufacturing  Tobacco Use  . Smoking status: Former Smoker    Years: 5.00    Quit date: 06/17/1997    Years since quitting: 22.3  . Smokeless tobacco: Never Used  . Tobacco comment: started smoking at ahe 16, and quit at age 44  Substance and Sexual Activity  . Alcohol use: Yes    Alcohol/week: 0.0 standard drinks    Comment: occasional alcohol use  . Drug use: No  . Sexual activity: Not on file  Other Topics Concern  . Not on file  Social History Narrative  . Not on file   Social Determinants of Health   Financial Resource Strain:   . Difficulty of Paying Living Expenses:   Food Insecurity:   . Worried About Charity fundraiser in the Last Year:   . Arboriculturist in the Last Year:   Transportation Needs:   . Film/video editor (Medical):   Marland Kitchen Lack of Transportation (Non-Medical):   Physical Activity:   . Days of Exercise per Week:   . Minutes of Exercise per Session:   Stress:   . Feeling of Stress :   Social Connections:   . Frequency of Communication with Friends and Family:   . Frequency of Social Gatherings with Friends and Family:   . Attends Religious Services:   . Active Member of Clubs or Organizations:   . Attends Archivist Meetings:   Marland Kitchen Marital Status:     Allergies:  Allergies  Allergen Reactions  . Bupropion      Extreme anxiety  . Morphine     Metabolic Disorder Labs: Lab Results  Component Value Date   HGBA1C 7.9 (A) 08/23/2019   No results found for: PROLACTIN Lab Results  Component Value Date   CHOL 173 05/24/2019   TRIG 718 (HH) 05/24/2019   HDL 30 (L) 05/24/2019   CHOLHDL 5.8 (H) 05/24/2019   LDLCALC 42 05/24/2019   [place] Comment 10/30/2018   Lab Results  Component Value Date   TSH 1.360 05/24/2019   TSH 1.570 01/24/2017    Therapeutic Level Labs: No results found for: LITHIUM No results found for: VALPROATE No components  found for:  CBMZ  Current Medications: Current Outpatient Medications  Medication Sig Dispense Refill  . ALPRAZolam (XANAX) 0.5 MG tablet Take 1 tablet (0.5 mg total) by mouth every 4 (four) hours as needed. For anxiety 16 tablet 1  . dicyclomine (BENTYL) 20 MG tablet TAKE 1 TABLET (20 MG TOTAL) BY MOUTH EVERY 6 (SIX) HOURS. FOR IRRITABLE BOWEL 120 tablet 1  . FARXIGA 10 MG TABS tablet TAKE 1 TABLET BY MOUTH DAILY 90 tablet 0  . fluconazole (DIFLUCAN) 150 MG tablet TAKE 1 TABLET BY MOUTH AS ONE DOSE 1 tablet 5  . glipiZIDE (GLUCOTROL XL) 10 MG 24 hr tablet TAKE 1 TABLET BY MOUTH DAILY WITH BREAKFAST. (Patient taking differently: Take 10 mg by mouth daily with breakfast. ) 90 tablet 4  . ketoconazole (NIZORAL) 2 % cream APPLY TO AFFECTED AREA ONCE DAILY FOR 7 DAYS AS NEEDED (Patient taking differently: Apply 1 application topically daily. APPLY TO AFFECTED AREA ONCE DAILY FOR 7 DAYS AS NEEDED) 15 g 3  . lamoTRIgine (LAMICTAL) 150 MG tablet Take 1 tablet (150 mg total) by mouth daily. 90 tablet 0  . omeprazole (PRILOSEC) 40 MG capsule TAKE 1 CAPSULE (40 MG TOTAL) BY MOUTH DAILY. 30 capsule 11  . ondansetron (ZOFRAN) 4 MG tablet Take 1 tablet (4 mg total) by mouth daily as needed for nausea or vomiting. 30 tablet 1  . oxyCODONE-acetaminophen (PERCOCET) 7.5-325 MG tablet Take 1 tablet by mouth every 4 (four) hours as needed. 60 tablet 0  . promethazine  (PHENERGAN) 25 MG tablet TAKE 1 TABLET (25 MG TOTAL) BY MOUTH EVERY 6 (SIX) HOURS AS NEEDED FOR NAUSEA OR VOMITING. 20 tablet 1  . QUEtiapine (SEROQUEL) 100 MG tablet Take 1.5 tablets (150 mg total) by mouth at bedtime. 135 tablet 0  . rosuvastatin (CRESTOR) 20 MG tablet Take 1 tablet (20 mg total) by mouth daily. 30 tablet 5  . sildenafil (VIAGRA) 50 MG tablet TAKE ONE OR TWO TABLETS AS NEEDED, NO MORE THAN 2 IN A DAY INSURANCE COVERS 6 TAB IN 25 DAYS 6 tablet 5  . TRULICITY 1.5 0000000 SOPN INJECT 1.5 MG INTO THE SKIN ONCE A WEEK. 4 pen 12  . Vilazodone HCl (VIIBRYD) 40 MG TABS Take 1 tablet (40 mg total) by mouth daily. 90 tablet 0   No current facility-administered medications for this visit.     Musculoskeletal: Strength & Muscle Tone: UTA Gait & Station: normal Patient leans: N/A  Psychiatric Specialty Exam: Review of Systems  Constitutional: Positive for fatigue.  Gastrointestinal: Positive for nausea and vomiting.  Psychiatric/Behavioral: Positive for dysphoric mood (Improving). The patient is nervous/anxious (improving).   All other systems reviewed and are negative.   There were no vitals taken for this visit.There is no height or weight on file to calculate BMI.  General Appearance: Casual  Eye Contact:  Fair  Speech:  Clear and Coherent  Volume:  Normal  Mood:  Anxious and Depressed  Affect:  Congruent  Thought Process:  Goal Directed and Descriptions of Associations: Intact  Orientation:  Full (Time, Place, and Person)  Thought Content: Logical   Suicidal Thoughts:  No  Homicidal Thoughts:  No  Memory:  Immediate;   Fair Recent;   Fair Remote;   Fair  Judgement:  Fair  Insight:  Fair  Psychomotor Activity:  Normal  Concentration:  Concentration: Fair and Attention Span: Fair  Recall:  [employer] of Knowledge: Fair  Language: Fair  Akathisia:  No  Handed:  Right  AIMS (if indicated): UTA  Assets:  Communication Skills Desire for  Improvement Housing Social Support  ADL's:  Intact  Cognition: WNL  Sleep:  Fair   Screenings: GAD-7     Office Visit from 04/16/2019 in [facility]  Total GAD-7 Score  20    PHQ2-9     Video Visit from 10/13/2019 in [place] Office Visit from 08/23/2019 in [facility] Visit from 04/16/2019 in [facility] Visit from 02/10/2018 in [facility] Visit from 12/24/2016 in [facility]  PHQ-2 Total Score  3  6  6  1  1   PHQ-9 Total Score  11  23  25  4  1        Assessment and Plan: Teyo is a 44 year old Caucasian male, employed, lives in [place], has a history of depression, anxiety, obstructive sleep apnea on CPAP, diabetes melitis, arthritis was evaluated by telemedicine today.  Patient is biologically predisposed given his history of trauma, family history of mental health problems.  Patient is currently making progress with regards to his depressive symptoms.  Patient does have psychosocial stressors of his health issues, cyclical vomiting which does affect his mood and also his ability to function.  Patient will continue to follow-up with his providers for the same.  Plan as noted below.  Plan MDD in partial remission PHQ 9 today equals 11 Continue Viibryd 40 mg p.o. daily Increase Lamictal to 150 mg p.o. daily Continue Seroquel 150 mg p.o. nightly.  Discussed with patient to take it earlier and to go to bed earlier to help with the grogginess.  GAD-improving Continue psychotherapy sessions with Ms. Miguel Dibble Continue Seroquel as prescribed  Social anxiety disorder-improving Continue CBT  Bereavement-improving Continue psychotherapy sessions  We will continue to coordinate care with Ms. Miguel Dibble.  Follow-up in clinic in 5 to 6 weeks or sooner if needed.  I have spent atleast 20 minutes non face to face with patient today. More than 50 % of the  time was spent for preparing to see the patient ( e.g., review of test, records ), ordering medications and test ,psychoeducation and supportive psychotherapy and care coordination,as well as documenting clinical information in electronic health record. This note was generated in part or whole with voice recognition software. Voice recognition is usually quite accurate but there are transcription errors that can and very often do occur. I apologize for any typographical errors that were not detected and corrected.       Ursula Alert, MD 10/13/2019, 3:36 PM

## 2019-10-13 NOTE — Telephone Encounter (Signed)
Requested medication (s) are due for refill today: Promethazine, yes  Requested medication (s) are on the active medication list: yes  Last refill:  08/30/19  Future visit scheduled: yes  Notes to clinic:  not delegated  Requested medication (s) are due for refill today: Prednisone, no  Requested medication (s) are on the active medication list: no  Last refill:  09/20/19  Future visit scheduled: yes  Notes to clinic: not delegated  Requested Prescriptions  Pending Prescriptions Disp Refills   promethazine (PHENERGAN) 25 MG tablet [Pharmacy Med Name: PROMETHAZINE 25 MG TABLET] 20 tablet 1    Sig: TAKE 1 TABLET (25 MG TOTAL) BY MOUTH EVERY 6 (SIX) HOURS AS NEEDED FOR NAUSEA OR VOMITING.      Not Delegated - Gastroenterology: Antiemetics Failed - 10/13/2019 12:31 PM      Failed - This refill cannot be delegated      Passed - Valid encounter within last 6 months    Recent Outpatient Visits           5 days ago    [facility] Birdie Sons, MD   1 month ago Type 2 diabetes mellitus without complication, without long-term current use of insulin ([place])   [facility] Birdie Sons, MD   3 months ago Post-cholecystectomy syndrome   [facility] Birdie Sons, MD   4 months ago Type 2 diabetes mellitus without complication, without long-term current use of insulin ([place])   [facility] Birdie Sons, MD   6 months ago Depression, unspecified depression type   [facility] Birdie Sons, MD       Future Appointments             In 2 months Fisher, Kirstie Peri, MD [facility]              predniSONE (DELTASONE) 10 MG tablet [Pharmacy Med Name: PREDNISONE 10 MG TABLET] 42 tablet 0    Sig: TAKE 6 TABLETS FOR 2 DAYS, THEN 5 FOR 2 DAYS, THEN 4 FOR 2 DAYS, THEN 3 FOR 2 DAYS, THEN 2 FOR 2 DAYS, THEN 1 FOR 2 DAYS.      Not Delegated - Endocrinology:  Oral Corticosteroids Failed -  10/13/2019 12:31 PM      Failed - This refill cannot be delegated      Failed - Last BP in normal range    BP Readings from Last 1 Encounters:  08/23/19 126/90          Passed - Valid encounter within last 6 months    Recent Outpatient Visits           5 days ago    [facility] Birdie Sons, MD   1 month ago Type 2 diabetes mellitus without complication, without long-term current use of insulin [facility])   [facility] Birdie Sons, MD   3 months ago Post-cholecystectomy syndrome   [facility] Birdie Sons, MD   4 months ago Type 2 diabetes mellitus without complication, without long-term current use of insulin [facility])   [facility] Birdie Sons, MD   6 months ago Depression, unspecified depression type   [facility] Birdie Sons, MD       Future Appointments             In 2 months Fisher, Kirstie Peri, MD [facility], [place]

## 2019-10-14 ENCOUNTER — Telehealth: Payer: Self-pay

## 2019-10-14 NOTE — Telephone Encounter (Signed)
Copied from [place] [phone]. Topic: General - Inquiry >> Oct 14, 2019 11:34 AM Lennox Solders wrote: Reason for CRM: pt is calling and follow up on disability form that dr Caryn Section has and [employer] needs the most recent progress note fax to [phone] claim number [id number] and [employer] phone number is [phone]

## 2019-10-15 NOTE — Telephone Encounter (Addendum)
Please fax copy of note for 10-08-2019 televideo visit

## 2019-10-18 ENCOUNTER — Other Ambulatory Visit: Payer: Self-pay | Admitting: Family Medicine

## 2019-10-19 MED ORDER — OXYCODONE-ACETAMINOPHEN 7.5-325 MG PO TABS: 1.0000 | ORAL_TABLET | ORAL | 0 refills | Status: DC | PRN

## 2019-10-29 ENCOUNTER — Other Ambulatory Visit: Payer: Self-pay | Admitting: Family Medicine

## 2019-11-01 MED ORDER — PREDNISONE 10 MG PO TABS: ORAL_TABLET | ORAL | 0 refills | Status: DC

## 2019-11-01 MED ORDER — OXYCODONE-ACETAMINOPHEN 7.5-325 MG PO TABS: 1.0000 | ORAL_TABLET | ORAL | 0 refills | Status: DC | PRN

## 2019-11-12 ENCOUNTER — Other Ambulatory Visit: Payer: Self-pay | Admitting: Family Medicine

## 2019-11-16 ENCOUNTER — Telehealth: Payer: Self-pay

## 2019-11-16 NOTE — Telephone Encounter (Signed)
It looks like the last referral order we placed was back in January of this year. Per referral notes: patient has a an appointment on 12/01/2019 at 2:10pm with Dr. Jacquiline Doe of [facility]. Their Phone number [phone]. Tried calling patient to advise him of this. Left message to call back. OK for [facility] Triage to discuss message with patient.

## 2019-11-16 NOTE — Telephone Encounter (Signed)
Copied from [place] [id number]. Topic: General - Other >> Nov 16, 2019 12:28 PM Hinda Lenis D wrote: Reason for CRM: PT was refer to a Gastro Dr, he never receive a call from them / please advise

## 2019-11-17 ENCOUNTER — Telehealth: Payer: 59 | Admitting: Psychiatry

## 2019-11-17 MED ORDER — OXYCODONE-ACETAMINOPHEN 7.5-325 MG PO TABS: 1.0000 | ORAL_TABLET | ORAL | 0 refills | Status: DC | PRN

## 2019-11-17 NOTE — Telephone Encounter (Signed)
Pt returned call, message/information provided. Pt verbalizes understanding.

## 2019-11-17 NOTE — Telephone Encounter (Signed)
Attempted to call pt.  Left vm to return call to office, to discuss referral to GI.

## 2019-11-18 ENCOUNTER — Other Ambulatory Visit: Payer: Self-pay | Admitting: Family Medicine

## 2019-11-18 DIAGNOSIS — E119 Type 2 diabetes mellitus without complications: Secondary | ICD-10-CM

## 2019-11-27 ENCOUNTER — Other Ambulatory Visit: Payer: Self-pay | Admitting: Family Medicine

## 2019-11-29 ENCOUNTER — Other Ambulatory Visit: Payer: Self-pay | Admitting: Family Medicine

## 2019-11-29 MED ORDER — OXYCODONE-ACETAMINOPHEN 7.5-325 MG PO TABS: 1.0000 | ORAL_TABLET | ORAL | 0 refills | Status: DC | PRN

## 2019-11-29 MED ORDER — PREDNISONE 10 MG PO TABS: ORAL_TABLET | ORAL | 0 refills | Status: DC

## 2019-12-02 ENCOUNTER — Telehealth: Payer: Self-pay

## 2019-12-02 NOTE — Telephone Encounter (Signed)
Copied from [place] [id number]. Topic: General - Inquiry >> Dec 02, 2019 11:04 AM Richardo Priest, NT wrote: Reason for CRM: Patient called in stating he saw GI doctor yesterday and they are requesting his most recent labs be faxed over to [phone]. Please advise.

## 2019-12-02 NOTE — Telephone Encounter (Signed)
Please advise if OK to release labs to GI. Patient's last labs were done while hospitalIzed on 07/05/2019. He did have a POCT A1C here in the ofice on 08/23/2019.

## 2019-12-02 NOTE — Telephone Encounter (Signed)
That's fine

## 2019-12-09 ENCOUNTER — Other Ambulatory Visit: Payer: Self-pay | Admitting: Family Medicine

## 2019-12-09 DIAGNOSIS — F419 Anxiety disorder, unspecified: Secondary | ICD-10-CM

## 2019-12-09 NOTE — Telephone Encounter (Signed)
Requested medication (s) are due for refill today: yes  Requested medication (s) are on the active medication list: yes  Last refill: Alprazolam 08/23/19 #16 1 refill   Fluconazol #1  5refills  Future visit scheduled: yes  Notes to clinic:  Alprazolam not delegated, Fluconazol off protocol    Requested Prescriptions  Pending Prescriptions Disp Refills   ALPRAZolam (XANAX) 0.5 MG tablet [Pharmacy Med Name: ALPRAZOLAM 0.5 MG TABLET] 16 tablet 1    Sig: Take 1 tablet (0.5 mg total) by mouth every 4 (four) hours as needed. For anxiety      Not Delegated - Psychiatry:  Anxiolytics/Hypnotics Failed - 12/09/2019  9:06 AM      Failed - This refill cannot be delegated      Failed - Urine Drug Screen completed in last 360 days.      Passed - Valid encounter within last 6 months    Recent Outpatient Visits           2 months ago Nausea vomiting and diarrhea   [facility] Birdie Sons, MD   3 months ago Type 2 diabetes mellitus without complication, without long-term current use of insulin [facility])   [facility] Birdie Sons, MD   5 months ago Post-cholecystectomy syndrome   [facility] Birdie Sons, MD   6 months ago Type 2 diabetes mellitus without complication, without long-term current use of insulin [facility])   [facility] Birdie Sons, MD   7 months ago Depression, unspecified depression type   [place], MD       Future Appointments             In 2 weeks Fisher, Kirstie Peri, MD [facility]              fluconazole (DIFLUCAN) 150 MG tablet [Pharmacy Med Name: FLUCONAZOLE 150 MG TABLET] 1 tablet 5    Sig: TAKE 1 TABLET BY MOUTH AS ONE DOSE      Off-Protocol Failed - 12/09/2019  9:06 AM      Failed - Medication not assigned to a protocol, review manually.      Passed - Valid encounter within last 12 months    Recent Outpatient Visits           2  months ago Nausea vomiting and diarrhea   [facility] Birdie Sons, MD   3 months ago Type 2 diabetes mellitus without complication, without long-term current use of insulin [facility])   [facility] Birdie Sons, MD   5 months ago Post-cholecystectomy syndrome   [facility] Birdie Sons, MD   6 months ago Type 2 diabetes mellitus without complication, without long-term current use of insulin [facility])   [facility] Birdie Sons, MD   7 months ago Depression, unspecified depression type   [facility] Birdie Sons, MD       Future Appointments             In 2 weeks Fisher, Kirstie Peri, MD [facility], [place]

## 2019-12-10 ENCOUNTER — Other Ambulatory Visit: Payer: Self-pay | Admitting: Family Medicine

## 2019-12-10 MED ORDER — OXYCODONE-ACETAMINOPHEN 7.5-325 MG PO TABS: 1.0000 | ORAL_TABLET | ORAL | 0 refills | Status: DC | PRN

## 2019-12-12 ENCOUNTER — Other Ambulatory Visit: Payer: Self-pay | Admitting: Family Medicine

## 2019-12-12 DIAGNOSIS — F331 Major depressive disorder, recurrent, moderate: Secondary | ICD-10-CM

## 2019-12-12 DIAGNOSIS — F609 Personality disorder, unspecified: Secondary | ICD-10-CM

## 2019-12-12 NOTE — Telephone Encounter (Signed)
Requested medication (s) are due for refill today: yes  Requested medication (s) are on the active medication list: no  Last refill:  07/30/19 expired 10/03/19 (dose changed  Future visit scheduled: yes  Notes to clinic:  NT cannot refuse or refill this med   Requested Prescriptions  Pending Prescriptions Disp Refills   lamoTRIgine (LAMICTAL) 100 MG tablet [Pharmacy Med Name: LAMOTRIGINE 100 MG TABLET] 90 tablet 0    Sig: Take 1 tablet (100 mg total) by mouth daily. For mood      Not Delegated - Neurology:  Anticonvulsants Failed - 12/12/2019  9:31 AM      Failed - This refill cannot be delegated      Failed - PLT in normal range and within 360 days    Platelets  Date Value Ref Range Status  07/03/2019 427 (H) 150 - 400 K/uL Final  06/08/2018 308 150 - 450 x10E3/uL Final          Failed - WBC in normal range and within 360 days    WBC  Date Value Ref Range Status  07/03/2019 12.9 (H) 4.0 - 10.5 K/uL Final          Passed - HCT in normal range and within 360 days    HCT  Date Value Ref Range Status  07/03/2019 50.2 39 - 52 % Final   Hematocrit  Date Value Ref Range Status  06/08/2018 44.5 37.5 - 51.0 % Final          Passed - HGB in normal range and within 360 days    Hemoglobin  Date Value Ref Range Status  07/03/2019 16.3 13.0 - 17.0 g/dL Final  06/08/2018 14.5 13.0 - 17.7 g/dL Final          Passed - Valid encounter within last 12 months    Recent Outpatient Visits           2 months ago Nausea vomiting and diarrhea   [facility] Birdie Sons, MD   3 months ago Type 2 diabetes mellitus without complication, without long-term current use of insulin ([place])   [facility] Birdie Sons, MD   5 months ago Post-cholecystectomy syndrome   [facility] Birdie Sons, MD   6 months ago Type 2 diabetes mellitus without complication, without long-term current use of insulin [facility])   [facility]  Birdie Sons, MD   8 months ago Depression, unspecified depression type   [facility] Birdie Sons, MD       Future Appointments             In 2 weeks Fisher, Kirstie Peri, MD [facility] [place]

## 2019-12-13 NOTE — Telephone Encounter (Signed)
Patient advised. He verbalized understanding.  

## 2019-12-13 NOTE — Telephone Encounter (Signed)
Please advise that prednisone causes serious long term side effects when taking excessively, and raises blood sugar. Can take it more than 10 days in a month and was just prescribed 12 day supply on 11-29-2019

## 2019-12-20 ENCOUNTER — Other Ambulatory Visit: Payer: Self-pay | Admitting: Family Medicine

## 2019-12-21 ENCOUNTER — Other Ambulatory Visit: Payer: Self-pay | Admitting: Family Medicine

## 2019-12-22 MED ORDER — OXYCODONE-ACETAMINOPHEN 7.5-325 MG PO TABS: 1.0000 | ORAL_TABLET | ORAL | 0 refills | Status: DC | PRN

## 2019-12-23 ENCOUNTER — Other Ambulatory Visit: Payer: Self-pay

## 2019-12-23 ENCOUNTER — Telehealth (INDEPENDENT_AMBULATORY_CARE_PROVIDER_SITE_OTHER): Payer: 59 | Admitting: Psychiatry

## 2019-12-23 ENCOUNTER — Encounter: Payer: Self-pay | Admitting: Psychiatry

## 2019-12-23 DIAGNOSIS — F4010 Social phobia, unspecified: Secondary | ICD-10-CM | POA: Diagnosis not present

## 2019-12-23 DIAGNOSIS — Z9189 Other specified personal risk factors, not elsewhere classified: Secondary | ICD-10-CM

## 2019-12-23 DIAGNOSIS — F3341 Major depressive disorder, recurrent, in partial remission: Secondary | ICD-10-CM | POA: Insufficient documentation

## 2019-12-23 DIAGNOSIS — F411 Generalized anxiety disorder: Secondary | ICD-10-CM | POA: Diagnosis not present

## 2019-12-23 DIAGNOSIS — F3342 Major depressive disorder, recurrent, in full remission: Secondary | ICD-10-CM | POA: Diagnosis not present

## 2019-12-23 DIAGNOSIS — F609 Personality disorder, unspecified: Secondary | ICD-10-CM | POA: Diagnosis not present

## 2019-12-23 MED ORDER — LAMOTRIGINE 150 MG PO TABS: 150.0000 mg | ORAL_TABLET | Freq: Every day | ORAL | 0 refills | Status: DC

## 2019-12-23 MED ORDER — VILAZODONE HCL 40 MG PO TABS: 40.0000 mg | ORAL_TABLET | Freq: Every day | ORAL | 0 refills | Status: DC

## 2019-12-23 NOTE — Progress Notes (Signed)
Provider Location : [facility] Patient Location : Home  Virtual Visit via Video Note  I connected with Raymond Rose on 12/23/19 at 11:20 AM EDT by a video enabled telemedicine application and verified that I am speaking with the correct person using two identifiers.   I discussed the limitations of evaluation and management by telemedicine and the availability of in person appointments. The patient expressed understanding and agreed to proceed.     I discussed the assessment and treatment plan with the patient. The patient was provided an opportunity to ask questions and all were answered. The patient agreed with the plan and demonstrated an understanding of the instructions.   The patient was advised to call back or seek an in-person evaluation if the symptoms worsen or if the condition fails to improve as anticipated.   Raymond Rose Progress Note  12/23/2019 12:17 PM Raymond Rose  MRN:  [id number]  Chief Complaint:  Chief Complaint    Follow-up     HPI: Raymond Rose is a 44 year old Caucasian male, unemployed, married, lives in [place], has a history of depression, GAD, social anxiety disorder, diabetes melitis, cyclical vomiting, sleep apnea, erectile dysfunction, arthritis was evaluated by telemedicine today.  Patient's last appointment was on 10/13/2019.  Patient today reports he is currently making progress with regards to his mood.  He denies any significant sadness, lack of motivation or other depressive symptoms.  Patient reports sleep is good.  He denies any restlessness at night at this time.  Patient with history of chronic suicidal thoughts today denies any active thoughts or plan.  Patient reports he continues to struggle with GI symptoms and had his appointment with his GI specialist recently.  Patient reports his medications were readjusted.  On review of notes per his GI specialist Dr. Arty Baumgartner 12/01/2019-' patient started on olanzapine 5 mg for nausea since other  antiemetic medications not helpful.'  Discussed with patient that since he was recently started on olanzapine, I would not recommend him staying on Seroquel as well.  Discussed with him the risk factors of being on 2 antipsychotic medications.  Olanzapine also helps with sleep and hence discussed with patient that the Seroquel can be discontinued and he can just stay on the olanzapine which currently also helps with his nausea and vomiting.  Patient has not been back to psychotherapy sessions with Ms. Miguel Dibble.  Writer had communicated with Ms. Grandville Silos who had reported that patient has been noncompliant with therapy visits.  Patient today reports he did try to reach out however has not received a response from his therapist.  While in session writer left a voicemail for his therapist explaining patient's problem connecting with therapist.  Encouraged patient to stay in therapy.  Patient denies any other concerns today.  Visit Diagnosis:    ICD-10-CM   1. MDD (major depressive disorder), recurrent, in full remission ([place])  F33.42 lamoTRIgine (LAMICTAL) 150 MG tablet    Vilazodone HCl (VIIBRYD) 40 MG TABS  2. GAD (generalized anxiety disorder)  F41.1   3. Social anxiety disorder  F40.10   4. Personality disorder, unspecified ([place])  F60.9   5. At risk for prolonged QT interval syndrome  Z91.89 EKG 12-Lead    Past Psychiatric History: I have reviewed past psychiatric history from my progress note on 04/21/2019.  Past trials of Wellbutrin, Lexapro, Elavil, Effexor  Past Medical History:  Past Medical History:  Diagnosis Date  . Anxiety   . Arthritis   . Depression   .  Diabetes mellitus, type II ([place])   . OSA on CPAP   . Vertigo 01/03/2015    Past Surgical History:  Procedure Laterality Date  . 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 CHOLECYSTECTOMY  2010   lap Cholecystectomy  . 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 CT scan of Brain  12/23/2011   without contrast, [facility], Normal  . TONSILLECTOMY  1985    Family Psychiatric History: I have reviewed family psychiatric history from my progress note on 04/21/2019  Family History:  Family History  Problem Relation Age of Onset  . Depression Mother   . Heart attack Father 101  . Arthritis Other   . Alcohol abuse Other   . Lung cancer Other   . Hyperlipidemia Other   . CAD Other   . Stroke Other   . Hypertension Other   . Bipolar disorder Other     Social History: I have reviewed social history from my progress note on 04/21/2019 Social History   Socioeconomic History  . Marital status: Married    Spouse name: Not on file  . Number of children: Not on file  . Years of education: Not on file  . Highest education level: Not on file  Occupational History  . Occupation: Banking    Comment: Engineer, manufacturing  Tobacco Use  . Smoking status: Former Smoker    Years: 5.00    Quit date: 06/17/1997    Years since quitting: 22.5  . Smokeless tobacco: Never Used  . Tobacco comment: started smoking at ahe 16, and quit at age 89  Vaping Use  . Vaping Use: Never used  Substance and Sexual Activity  . Alcohol use: Yes    Alcohol/week: 0.0 standard drinks    Comment: occasional alcohol use  . Drug use: No  . Sexual activity: Not on file  Other Topics Concern  . Not on file  Social History Narrative  . Not on file   Social Determinants of Health   Financial Resource Strain:   . Difficulty of Paying Living Expenses:   Food Insecurity:   . Worried About Charity fundraiser in the Last Year:   . Arboriculturist in the Last Year:   Transportation Needs:   . Film/video editor (Medical):   Marland Kitchen Lack of Transportation (Non-Medical):   Physical Activity:   . Days of Exercise per Week:   . Minutes of Exercise per Session:   Stress:   . Feeling of Stress :   Social Connections:   . Frequency of Communication with Friends and  Family:   . Frequency of Social Gatherings with Friends and Family:   . Attends Religious Services:   . Active Member of Clubs or Organizations:   . Attends Archivist Meetings:   Marland Kitchen Marital Status:     Allergies:  Allergies  Allergen Reactions  . Bupropion     Extreme anxiety  . Morphine     Metabolic Disorder Labs: Lab Results  Component Value Date   HGBA1C 7.9 (A) 08/23/2019   No results found for: PROLACTIN Lab Results  Component Value Date   CHOL 173 05/24/2019   TRIG 718 (HH) 05/24/2019   HDL 30 (L) 05/24/2019   CHOLHDL 5.8 (H) 05/24/2019   LDLCALC 42 05/24/2019   [place] Comment 10/30/2018   Lab Results  Component Value Date  TSH 1.360 05/24/2019   TSH 1.570 01/24/2017    Therapeutic Level Labs: No results found for: LITHIUM No results found for: VALPROATE No components found for:  CBMZ  Current Medications: Current Outpatient Medications  Medication Sig Dispense Refill  . meloxicam (MOBIC) 15 MG tablet Take by mouth.    . OLANZapine (ZYPREXA) 5 MG tablet Take by mouth.    . ALPRAZolam (XANAX) 0.5 MG tablet TAKE 1 TABLET (0.5 MG TOTAL) BY MOUTH EVERY 4 (FOUR) HOURS AS NEEDED. FOR ANXIETY 16 tablet 1  . dicyclomine (BENTYL) 20 MG tablet TAKE 1 TABLET (20 MG TOTAL) BY MOUTH EVERY 6 (SIX) HOURS. FOR IRRITABLE BOWEL 120 tablet 1  . FARXIGA 10 MG TABS tablet TAKE 1 TABLET BY MOUTH DAILY 90 tablet 0  . fluconazole (DIFLUCAN) 150 MG tablet TAKE 1 TABLET BY MOUTH AS ONE DOSE 1 tablet 5  . glipiZIDE (GLUCOTROL XL) 10 MG 24 hr tablet Take 1 tablet (10 mg total) by mouth daily with breakfast. 90 tablet 4  . ketoconazole (NIZORAL) 2 % cream APPLY TO AFFECTED AREA ONCE DAILY FOR 7 DAYS AS NEEDED (Patient taking differently: Apply 1 application topically daily. APPLY TO AFFECTED AREA ONCE DAILY FOR 7 DAYS AS NEEDED) 15 g 3  . lamoTRIgine (LAMICTAL) 150 MG tablet Take 1 tablet (150 mg total) by mouth daily. 90 tablet 0  . omeprazole (PRILOSEC) 40 MG capsule  TAKE 1 CAPSULE (40 MG TOTAL) BY MOUTH DAILY. 30 capsule 2  . ondansetron (ZOFRAN) 4 MG tablet Take 1 tablet (4 mg total) by mouth daily as needed for nausea or vomiting. 30 tablet 1  . oxyCODONE-acetaminophen (PERCOCET) 7.5-325 MG tablet Take 1 tablet by mouth every 4 (four) hours as needed. 60 tablet 0  . predniSONE (DELTASONE) 10 MG tablet TAKE 6 TABLETS FOR 2 DAYS, THEN 5 FOR 2 DAYS, THEN 4 FOR 2 DAYS, THEN 3 FOR 2 DAYS, THEN 2 FOR 2 DAYS, THEN 1 FOR 2 DAYS. 42 tablet 0  . promethazine (PHENERGAN) 25 MG tablet TAKE 1 TABLET (25 MG TOTAL) BY MOUTH EVERY 6 (SIX) HOURS AS NEEDED FOR NAUSEA OR VOMITING. 20 tablet 1  . rosuvastatin (CRESTOR) 20 MG tablet TAKE 1 TABLET BY MOUTH EVERY DAY 30 tablet 12  . sildenafil (VIAGRA) 50 MG tablet TAKE ONE OR TWO TABLETS AS NEEDED, NO MORE THAN 2 IN A DAY INSURANCE COVERS 6 TAB IN 25 DAYS 6 tablet 5  . TRULICITY 1.5 NG/2.9BM SOPN INJECT 1.5 MG INTO THE SKIN ONCE A WEEK. 4 pen 12  . Vilazodone HCl (VIIBRYD) 40 MG TABS Take 1 tablet (40 mg total) by mouth daily. 90 tablet 0   No current facility-administered medications for this visit.     Musculoskeletal: Strength & Muscle Tone: UTA Gait & Station: normal Patient leans: N/A  Psychiatric Specialty Exam: Review of Systems  Gastrointestinal: Positive for diarrhea (chronic), nausea and vomiting (chronic).  Psychiatric/Behavioral: Negative for agitation, behavioral problems, confusion, decreased concentration, dysphoric mood, hallucinations, self-injury, sleep disturbance and suicidal ideas. The patient is not nervous/anxious and is not hyperactive.   All other systems reviewed and are negative.   There were no vitals taken for this visit.There is no height or weight on file to calculate BMI.  General Appearance: Casual  Eye Contact:  Fair  Speech:  Clear and Coherent  Volume:  Normal  Mood:  Euthymic  Affect:  Congruent  Thought Process:  Goal Directed and Descriptions of Associations: Intact   Orientation:  Full (Time, Place, and  Person)  Thought Content: Logical   Suicidal Thoughts:  No  Homicidal Thoughts:  No  Memory:  Immediate;   Fair Recent;   Fair Remote;   Fair  Judgement:  Fair  Insight:  Fair  Psychomotor Activity:  Normal  Concentration:  Concentration: Fair and Attention Span: Fair  Recall:  [employer] of Knowledge: Fair  Language: Fair  Akathisia:  No  Handed:  Right  AIMS (if indicated): UTA  Assets:  Communication Skills Desire for Improvement Housing Social Support  ADL's:  Intact  Cognition: WNL  Sleep:  Fair   Screenings: GAD-7     Office Visit from 04/16/2019 in [place]  Total GAD-7 Score 20    PHQ2-9     Video Visit from 10/13/2019 in [place] Office Visit from 08/23/2019 in [place] Visit from 04/16/2019 in [place] Visit from 02/10/2018 in [place] Visit from 12/24/2016 in [place]  PHQ-2 Total Score 3 6 6 1 1   PHQ-9 Total Score 11 23 25 4 1        Assessment and Plan: Raymond Rose is a 44 year old Caucasian male, unemployed, [place], has a history of depression, anxiety, obstructive sleep apnea on CPAP, diabetes melitis, arthritis was evaluated by telemedicine today.  Patient is biologically predisposed given his history of trauma, family history of mental health problems.  Patient is currently stable on current medication regimen however was recently started on another antipsychotic medication-olanzapine.  Since patient is currently on 2 antipsychotic medication which I do not recommend for this patient will discontinue Seroquel.  Discussed plan as noted below.  Plan MDD in remission Viibryd 40 mg p.o. daily Lamictal 150 mg p.o. daily Discontinue Seroquel. Continue olanzapine 5 mg p.o. nightly for his mood and sleep-recently started by GI specialist for  nausea.  GAD-improving Encouraged patient to restart psychotherapy sessions with Ms. Miguel Dibble Writer also contacted Ms. Miguel Dibble while in session today-left a voicemail explaining patient's connection problem.   Social anxiety disorder-improving Continue CBT  At risk for QT syndrome-will order EKG.  Discussed with patient to contact his primary care provider.  I have reviewed notes dated 12/01/2019-by Dr. Sport and exercise psychologist as summarized above.  Follow-up in clinic in 5 to 6 weeks or sooner if needed.  I have spent atleast 30 minutes non face to face with patient today. More than 50 % of the time was spent for preparing to see the patient ( e.g., review of test, records ), obtaining and to review and separately obtained history , ordering medications and test ,psychoeducation and supportive psychotherapy and care coordination,as well as documenting clinical information in electronic health record. This note was generated in part or whole with voice recognition software. Voice recognition is usually quite accurate but there are transcription errors that can and very often do occur. I apologize for any typographical errors that were not detected and corrected.      Ursula Alert, MD 12/23/2019, 12:17 PM

## 2019-12-27 ENCOUNTER — Other Ambulatory Visit: Payer: Self-pay | Admitting: Family Medicine

## 2019-12-27 DIAGNOSIS — E119 Type 2 diabetes mellitus without complications: Secondary | ICD-10-CM

## 2019-12-28 NOTE — Progress Notes (Deleted)
Established patient visit   Patient: Raymond Rose   DOB: 1975/09/15   44 y.o. Male  MRN: [id number] Visit Date: 12/29/2019  Today's healthcare provider: [name], MD   No chief complaint on file.  Subjective    HPI  Diabetes Mellitus Type II, Follow-up  Lab Results  Component Value Date   HGBA1C 7.9 (A) 08/23/2019   HGBA1C 8.7 (H) 05/24/2019   HGBA1C 8.2 (A) 05/24/2019   Wt Readings from Last 3 Encounters:  08/23/19 (!) 332 lb 12.8 oz (151 kg)  07/04/19 267 lb 10.2 oz (121.4 kg)  05/24/19 (!) 349 lb 3.2 oz (158.4 kg)   Last seen for diabetes 4 months ago.  Management since then includes no change. He reports {excellent/good/fair/poor:19665} compliance with treatment. He {is/is not:21021397} having side effects. {document side effects if present:1} Symptoms: {Yes/No:20286} fatigue {Yes/No:20286} foot ulcerations  {Yes/No:20286} appetite changes {Yes/No:20286} nausea  {Yes/No:20286} paresthesia of the feet  {Yes/No:20286} polydipsia  {Yes/No:20286} polyuria {Yes/No:20286} visual disturbances   {Yes/No:20286} vomiting     Home blood sugar records: {diabetes glucometry results:16657}  Episodes of hypoglycemia? {Yes/No:20286} {enter symptoms and frequency of symptoms if yes:1}   Current insulin regiment: None Most Recent Eye Exam: not UTD {Current exercise:16438:::1} {Current diet habits:16563:::1}  Pertinent Labs: Lab Results  Component Value Date   CHOL 173 05/24/2019   HDL 30 (L) 05/24/2019   LDLCALC 42 05/24/2019   TRIG 718 (HH) 05/24/2019   CHOLHDL 5.8 (H) 05/24/2019   Lab Results  Component Value Date   NA 139 07/05/2019   K 3.2 (L) 07/05/2019   CREATININE 0.69 07/05/2019   GFRNONAA >60 07/05/2019   GFRAA >60 07/05/2019   GLUCOSE 184 (H) 07/05/2019     ---------------------------------------------------------------------------------------------------   {Show patient history (optional):23778::" "}   Medications: Outpatient  Medications Prior to Visit  Medication Sig  . ALPRAZolam (XANAX) 0.5 MG tablet TAKE 1 TABLET (0.5 MG TOTAL) BY MOUTH EVERY 4 (FOUR) HOURS AS NEEDED. FOR ANXIETY  . dicyclomine (BENTYL) 20 MG tablet TAKE 1 TABLET (20 MG TOTAL) BY MOUTH EVERY 6 (SIX) HOURS. FOR IRRITABLE BOWEL  . FARXIGA 10 MG TABS tablet TAKE 1 TABLET BY MOUTH EVERY DAY  . fluconazole (DIFLUCAN) 150 MG tablet TAKE 1 TABLET BY MOUTH AS ONE DOSE  . glipiZIDE (GLUCOTROL XL) 10 MG 24 hr tablet Take 1 tablet (10 mg total) by mouth daily with breakfast.  . ketoconazole (NIZORAL) 2 % cream APPLY TO AFFECTED AREA ONCE DAILY FOR 7 DAYS AS NEEDED (Patient taking differently: Apply 1 application topically daily. APPLY TO AFFECTED AREA ONCE DAILY FOR 7 DAYS AS NEEDED)  . lamoTRIgine (LAMICTAL) 150 MG tablet Take 1 tablet (150 mg total) by mouth daily.  . meloxicam (MOBIC) 15 MG tablet Take by mouth.  . OLANZapine (ZYPREXA) 5 MG tablet Take by mouth.  Marland Kitchen omeprazole (PRILOSEC) 40 MG capsule TAKE 1 CAPSULE (40 MG TOTAL) BY MOUTH DAILY.  Marland Kitchen ondansetron (ZOFRAN) 4 MG tablet Take 1 tablet (4 mg total) by mouth daily as needed for nausea or vomiting.  Marland Kitchen oxyCODONE-acetaminophen (PERCOCET) 7.5-325 MG tablet Take 1 tablet by mouth every 4 (four) hours as needed.  . predniSONE (DELTASONE) 10 MG tablet TAKE 6 TABLETS FOR 2 DAYS, THEN 5 FOR 2 DAYS, THEN 4 FOR 2 DAYS, THEN 3 FOR 2 DAYS, THEN 2 FOR 2 DAYS, THEN 1 FOR 2 DAYS.  Marland Kitchen promethazine (PHENERGAN) 25 MG tablet TAKE 1 TABLET (25 MG TOTAL) BY MOUTH EVERY 6 (SIX) HOURS AS  NEEDED FOR NAUSEA OR VOMITING.  . rosuvastatin (CRESTOR) 20 MG tablet TAKE 1 TABLET BY MOUTH EVERY DAY  . sildenafil (VIAGRA) 50 MG tablet TAKE ONE OR TWO TABLETS AS NEEDED, NO MORE THAN 2 IN A DAY INSURANCE COVERS 6 TAB IN 25 DAYS  . TRULICITY 1.5 XU/3.8BF SOPN INJECT 1.5 MG INTO THE SKIN ONCE A WEEK.  . Vilazodone HCl (VIIBRYD) 40 MG TABS Take 1 tablet (40 mg total) by mouth daily.   No facility-administered medications prior to visit.      Review of Systems  {Heme  Chem  Endocrine  Serology  Results Review (optional):23779::" "}  Objective    There were no vitals taken for this visit. {Show previous vital signs (optional):23777::" "}  Physical Exam  ***  No results found for any visits on 12/29/19.  Assessment & Plan     ***  No follow-ups on file.      {provider attestation***:1}   [name], MD  [facility] [phone] (phone) [phone] (fax)  [place]

## 2019-12-29 ENCOUNTER — Ambulatory Visit: Payer: 59 | Admitting: Family Medicine

## 2019-12-31 ENCOUNTER — Other Ambulatory Visit: Payer: Self-pay | Admitting: Family Medicine

## 2020-01-03 MED ORDER — OXYCODONE-ACETAMINOPHEN 7.5-325 MG PO TABS: 1.0000 | ORAL_TABLET | ORAL | 0 refills | Status: DC | PRN

## 2020-01-07 NOTE — Progress Notes (Signed)
I,Raymond Rose,acting as a scribe for Raymond Huh, MD.,have documented all relevant documentation on the behalf of Raymond Huh, MD,as directed by  Raymond Huh, MD while in the presence of Raymond Huh, MD.   Established patient visit   Patient: Raymond Rose   DOB: 1975-10-20   44 y.o. Male  MRN: [id number] Visit Date: 01/10/2020  Today's healthcare provider: [name], MD   Chief Complaint  Patient presents with  . Diabetes   Subjective    HPI  Diabetes Mellitus Type II, Follow-up  Recent Labs       Lab Results  Component Value Date   HGBA1C 7.9 (A) 08/23/2019   HGBA1C 8.7 (H) 05/24/2019   HGBA1C 8.2 (A) 05/24/2019        Wt Readings from Last 3 Encounters:  08/23/19 (!) 332 lb 12.8 oz (151 kg)  07/04/19 267 lb 10.2 oz (121.4 kg)  05/24/19 (!) 349 lb 3.2 oz (158.4 kg)   Last seen for diabetes 4 months ago.  Management since then includes no change. He reports good compliance with treatment. He is not having side effects.  Symptoms: No fatigue No foot ulcerations  No appetite changes yes nausea  No paresthesia of the feet  Yes polydipsia  Yes polyuria No visual disturbances   Yes vomiting     Home blood sugar records: fasting range: 180-200  Episodes of hypoglycemia? No           Current insulin regiment: None Most Recent Eye Exam: not UTD Current exercise: golfing 1-2 times weekly Current diet habits: well balanced  Lab Results  Component Value Date   CHOL 173 05/24/2019   HDL 30 (L) 05/24/2019   LDLCALC 42 05/24/2019   TRIG 718 (HH) 05/24/2019   CHOLHDL 5.8 (H) 05/24/2019   He has been off of metformin for several months due to chronic nausea, but states there has been no improvement with nausea since doing so.  ---------------------------------------------------------------------------------------------------  He also complains of left medial knee pain for several months, worse when golfing and standing for  long periods of time.   Medications: Outpatient Medications Prior to Visit  Medication Sig  . ALPRAZolam (XANAX) 0.5 MG tablet TAKE 1 TABLET (0.5 MG TOTAL) BY MOUTH EVERY 4 (FOUR) HOURS AS NEEDED. FOR ANXIETY  . FARXIGA 10 MG TABS tablet TAKE 1 TABLET BY MOUTH EVERY DAY  . fluconazole (DIFLUCAN) 150 MG tablet TAKE 1 TABLET BY MOUTH AS ONE DOSE  . glipiZIDE (GLUCOTROL XL) 10 MG 24 hr tablet Take 1 tablet (10 mg total) by mouth daily with breakfast.  . ketoconazole (NIZORAL) 2 % cream APPLY TO AFFECTED AREA ONCE DAILY FOR 7 DAYS AS NEEDED (Patient taking differently: Apply 1 application topically daily. APPLY TO AFFECTED AREA ONCE DAILY FOR 7 DAYS AS NEEDED)  . lamoTRIgine (LAMICTAL) 150 MG tablet Take 1 tablet (150 mg total) by mouth daily.  Marland Kitchen OLANZapine (ZYPREXA) 5 MG tablet Take by mouth.  Marland Kitchen omeprazole (PRILOSEC) 40 MG capsule TAKE 1 CAPSULE (40 MG TOTAL) BY MOUTH DAILY.  Marland Kitchen ondansetron (ZOFRAN) 4 MG tablet Take 1 tablet (4 mg total) by mouth daily as needed for nausea or vomiting.  Marland Kitchen oxyCODONE-acetaminophen (PERCOCET) 7.5-325 MG tablet Take 1 tablet by mouth every 4 (four) hours as needed.  . predniSONE (DELTASONE) 10 MG tablet TAKE 6 TABLETS FOR 2 DAYS, THEN 5 FOR 2 DAYS, THEN 4 FOR 2 DAYS, THEN 3 FOR 2 DAYS, THEN 2 FOR 2 DAYS, THEN 1 FOR  2 DAYS.  Marland Kitchen promethazine (PHENERGAN) 25 MG tablet TAKE 1 TABLET (25 MG TOTAL) BY MOUTH EVERY 6 (SIX) HOURS AS NEEDED FOR NAUSEA OR VOMITING.  . rosuvastatin (CRESTOR) 20 MG tablet TAKE 1 TABLET BY MOUTH EVERY DAY  . TRULICITY 1.5 MB/8.6LJ SOPN INJECT 1.5 MG INTO THE SKIN ONCE A WEEK.  . Vilazodone HCl (VIIBRYD) 40 MG TABS Take 1 tablet (40 mg total) by mouth daily.  . sildenafil (VIAGRA) 50 MG tablet TAKE ONE OR TWO TABLETS AS NEEDED, NO MORE THAN 2 IN A DAY INSURANCE COVERS 6 TAB IN 25 DAYS (Patient not taking: Reported on 01/10/2020)  . [DISCONTINUED] dicyclomine (BENTYL) 20 MG tablet TAKE 1 TABLET (20 MG TOTAL) BY MOUTH EVERY 6 (SIX) HOURS. FOR IRRITABLE  BOWEL (Patient not taking: Reported on 01/10/2020)  . [DISCONTINUED] meloxicam (MOBIC) 15 MG tablet Take by mouth. (Patient not taking: Reported on 01/10/2020)   No facility-administered medications prior to visit.    Review of Systems  Constitutional: Negative for appetite change, chills and fever.  Respiratory: Negative for chest tightness, shortness of breath and wheezing.   Cardiovascular: Negative for chest pain and palpitations.  Gastrointestinal: Positive for nausea and vomiting. Negative for abdominal pain.  Endocrine: Positive for polydipsia and polyuria.      Objective    BP (!) 124/87 (BP Location: Right Arm, Patient Position: Sitting, Cuff Size: Large)   Pulse 99   Temp 97.8 F (36.6 C) (Temporal)   Resp 18   Wt (!) 326 lb (147.9 kg)   SpO2 96% Comment: room air  BMI 43.01 kg/m    Physical Exam  General appearance: Severely obese male, cooperative and in no acute distress Head: Normocephalic, without obvious abnormality, atraumatic Respiratory: Respirations even and unlabored, normal respiratory rate Extremities: Tender medial joint line of left knee. Positive McMurray's test. Mild pain with left leg extension. .   Results for orders placed or performed in visit on 01/10/20  POCT HgB A1C  Result Value Ref Range   Hemoglobin A1C 9.7 (A) 4.0 - 5.6 %   Est. average glucose Bld gHb Est-mCnc 232     Assessment & Plan     1. Type 2 diabetes mellitus without complication, without long-term current use of insulin (HCC) Neither Trulicity or metformin seemed to have any affect his chronic nausea. Will continue current dose of Trulicity, start back on low dose - metFORMIN (GLUCOPHAGE-XR) 500 MG 24 hr tablet; Take 1-2 tablets (1,000 mg total) by mouth 2 (two) times daily.  Dispense: 3 tablet  2. Chronic pain of left knee Suspect meniscus injury. Try OTC topical diclofenac and straight leg raises. Consider orthopedic referral.   Future Appointments  Date Time Provider  [place]  02/29/2020  2:00 PM Ursula Alert, MD [facility] None  04/12/2020  9:40 AM Caryn Section Kirstie Peri, MD [facility]         The entirety of the information documented in the History of Present Illness, Review of Systems and Physical Exam were personally obtained by me. Portions of this information were initially documented by the CMA and reviewed by me for thoroughness and accuracy.      Raymond Huh, MD  [facility] [phone] (phone) [phone] (fax)  [place]

## 2020-01-10 ENCOUNTER — Encounter: Payer: Self-pay | Admitting: Family Medicine

## 2020-01-10 ENCOUNTER — Other Ambulatory Visit: Payer: Self-pay

## 2020-01-10 ENCOUNTER — Ambulatory Visit: Payer: 59 | Admitting: Family Medicine

## 2020-01-10 VITALS — BP 124/87 | HR 99 | Temp 97.8°F | Resp 18 | Wt 326.0 lb

## 2020-01-10 DIAGNOSIS — E119 Type 2 diabetes mellitus without complications: Secondary | ICD-10-CM

## 2020-01-10 DIAGNOSIS — G8929 Other chronic pain: Secondary | ICD-10-CM

## 2020-01-10 DIAGNOSIS — M25562 Pain in left knee: Secondary | ICD-10-CM

## 2020-01-10 LAB — POCT GLYCOSYLATED HEMOGLOBIN (HGB A1C)
Est. average glucose Bld gHb Est-mCnc: 232
Hemoglobin A1C: 9.7 % — AB (ref 4.0–5.6)

## 2020-01-10 MED ORDER — METFORMIN HCL ER 500 MG PO TB24: 1000.0000 mg | ORAL_TABLET | Freq: Two times a day (BID) | ORAL | Status: DC

## 2020-01-10 NOTE — Patient Instructions (Addendum)
•   Start back on metformin ER 500mg  once a day for the next month, then increase to 2 every day.    Try OTC Voltaren Gel 1% applied to sore area of knee 2-3 times a day. If not better in a few week, then let me know and we can set up to see on orthopedist.    Try doing 2-3 sets of 10 straight leg raises every day

## 2020-01-15 ENCOUNTER — Other Ambulatory Visit: Payer: Self-pay | Admitting: Psychiatry

## 2020-01-15 DIAGNOSIS — F3342 Major depressive disorder, recurrent, in full remission: Secondary | ICD-10-CM

## 2020-01-17 ENCOUNTER — Other Ambulatory Visit: Payer: Self-pay | Admitting: Family Medicine

## 2020-01-18 MED ORDER — OXYCODONE-ACETAMINOPHEN 7.5-325 MG PO TABS: 1.0000 | ORAL_TABLET | ORAL | 0 refills | Status: DC | PRN

## 2020-01-23 ENCOUNTER — Other Ambulatory Visit: Payer: Self-pay

## 2020-01-23 ENCOUNTER — Inpatient Hospital Stay
Admission: EM | Admit: 2020-01-23 | Discharge: 2020-01-25 | DRG: 638 | Disposition: A | Discharge status: Home / Self Care | Payer: 59 | Attending: Internal Medicine | Admitting: Internal Medicine

## 2020-01-23 ENCOUNTER — Encounter: Payer: Self-pay | Admitting: Emergency Medicine

## 2020-01-23 DIAGNOSIS — F418 Other specified anxiety disorders: Secondary | ICD-10-CM | POA: Diagnosis not present

## 2020-01-23 DIAGNOSIS — Z6841 Body Mass Index (BMI) 40.0 and over, adult: Secondary | ICD-10-CM

## 2020-01-23 DIAGNOSIS — E876 Hypokalemia: Secondary | ICD-10-CM | POA: Diagnosis not present

## 2020-01-23 DIAGNOSIS — G8929 Other chronic pain: Secondary | ICD-10-CM | POA: Diagnosis present

## 2020-01-23 DIAGNOSIS — F419 Anxiety disorder, unspecified: Secondary | ICD-10-CM | POA: Diagnosis present

## 2020-01-23 DIAGNOSIS — Z794 Long term (current) use of insulin: Secondary | ICD-10-CM

## 2020-01-23 DIAGNOSIS — E1110 Type 2 diabetes mellitus with ketoacidosis without coma: Secondary | ICD-10-CM | POA: Diagnosis not present

## 2020-01-23 DIAGNOSIS — Z87891 Personal history of nicotine dependence: Secondary | ICD-10-CM

## 2020-01-23 DIAGNOSIS — E782 Mixed hyperlipidemia: Secondary | ICD-10-CM | POA: Diagnosis not present

## 2020-01-23 DIAGNOSIS — Z8249 Family history of ischemic heart disease and other diseases of the circulatory system: Secondary | ICD-10-CM

## 2020-01-23 DIAGNOSIS — G4733 Obstructive sleep apnea (adult) (pediatric): Secondary | ICD-10-CM | POA: Diagnosis present

## 2020-01-23 DIAGNOSIS — Z7984 Long term (current) use of oral hypoglycemic drugs: Secondary | ICD-10-CM

## 2020-01-23 DIAGNOSIS — E1310 Other specified diabetes mellitus with ketoacidosis without coma: Secondary | ICD-10-CM

## 2020-01-23 DIAGNOSIS — Z8616 Personal history of COVID-19: Secondary | ICD-10-CM

## 2020-01-23 DIAGNOSIS — G894 Chronic pain syndrome: Secondary | ICD-10-CM | POA: Diagnosis present

## 2020-01-23 DIAGNOSIS — F329 Major depressive disorder, single episode, unspecified: Secondary | ICD-10-CM | POA: Diagnosis present

## 2020-01-23 HISTORY — DX: Cyclical vomiting syndrome unrelated to migraine: R11.15

## 2020-01-23 LAB — URINALYSIS, COMPLETE (UACMP) WITH MICROSCOPIC
Bacteria, UA: NONE SEEN
Bilirubin Urine: NEGATIVE
Glucose, UA: >=500 mg/dL — AB
Hgb urine dipstick: NEGATIVE
Ketones, ur: 80 mg/dL — AB
Leukocytes,Ua: NEGATIVE
Nitrite: NEGATIVE
Protein, ur: 30 mg/dL — AB
Specific Gravity, Urine: 1.031 — ABNORMAL HIGH (ref 1.005–1.030)
Squamous Epithelial / HPF: NONE SEEN (ref 0–5)
pH: 5 (ref 5.0–8.0)

## 2020-01-23 LAB — BASIC METABOLIC PANEL
Anion gap: 15 (ref 5–15)
Anion gap: 11 (ref 5–15)
BUN: 13 mg/dL (ref 6–20)
BUN: 12 mg/dL (ref 6–20)
CO2: 14 mmol/L — ABNORMAL LOW (ref 22–32)
CO2: 15 mmol/L — ABNORMAL LOW (ref 22–32)
Calcium: 8 mg/dL — ABNORMAL LOW (ref 8.9–10.3)
Calcium: 8 mg/dL — ABNORMAL LOW (ref 8.9–10.3)
Chloride: 108 mmol/L (ref 98–111)
Chloride: 112 mmol/L — ABNORMAL HIGH (ref 98–111)
Creatinine, Ser: 0.97 mg/dL (ref 0.61–1.24)
Creatinine, Ser: 0.8 mg/dL (ref 0.61–1.24)
GFR calc Af Amer: >60 mL/min (ref >60)
GFR calc Af Amer: >60 mL/min (ref >60)
GFR calc non Af Amer: >60 mL/min (ref >60)
GFR calc non Af Amer: >60 mL/min (ref >60)
Glucose, Bld: 184 mg/dL — ABNORMAL HIGH (ref 70–99)
Glucose, Bld: 151 mg/dL — ABNORMAL HIGH (ref 70–99)
Potassium: 3.8 mmol/L (ref 3.5–5.1)
Potassium: 3.8 mmol/L (ref 3.5–5.1)
Sodium: 137 mmol/L (ref 135–145)
Sodium: 138 mmol/L (ref 135–145)

## 2020-01-23 LAB — BLOOD GAS, VENOUS
Acid-base deficit: 20.1 mmol/L — ABNORMAL HIGH (ref 0.0–2.0)
Bicarbonate: 9 mmol/L — ABNORMAL LOW (ref 20.0–28.0)
O2 Saturation: 46.9 %
Patient temperature: 37
pCO2, Ven: 31 mmHg — ABNORMAL LOW (ref 44.0–60.0)
pH, Ven: 7.07 — CL (ref 7.250–7.430)
pO2, Ven: 38 mmHg (ref 32.0–45.0)

## 2020-01-23 LAB — CBC
HCT: 52.6 % — ABNORMAL HIGH (ref 39.0–52.0)
Hemoglobin: 17 g/dL (ref 13.0–17.0)
MCH: 29.2 pg (ref 26.0–34.0)
MCHC: 32.3 g/dL (ref 30.0–36.0)
MCV: 90.2 fL (ref 80.0–100.0)
Platelets: 396 10*3/uL (ref 150–400)
RBC: 5.83 MIL/uL — ABNORMAL HIGH (ref 4.22–5.81)
RDW: 14.6 % (ref 11.5–15.5)
WBC: 14.3 10*3/uL — ABNORMAL HIGH (ref 4.0–10.5)
nRBC: 0 % (ref 0.0–0.2)

## 2020-01-23 LAB — COMPREHENSIVE METABOLIC PANEL
ALT: 21 U/L (ref 0–44)
AST: 20 U/L (ref 15–41)
Albumin: 4.9 g/dL (ref 3.5–5.0)
Alkaline Phosphatase: 106 U/L (ref 38–126)
Anion gap: 25 — ABNORMAL HIGH (ref 5–15)
BUN: 17 mg/dL (ref 6–20)
CO2: 10 mmol/L — ABNORMAL LOW (ref 22–32)
Calcium: 9.2 mg/dL (ref 8.9–10.3)
Chloride: 100 mmol/L (ref 98–111)
Creatinine, Ser: 1.25 mg/dL — ABNORMAL HIGH (ref 0.61–1.24)
GFR calc Af Amer: >60 mL/min (ref >60)
GFR calc non Af Amer: >60 mL/min (ref >60)
Glucose, Bld: 329 mg/dL — ABNORMAL HIGH (ref 70–99)
Potassium: 4.4 mmol/L (ref 3.5–5.1)
Sodium: 135 mmol/L (ref 135–145)
Total Bilirubin: 2.5 mg/dL — ABNORMAL HIGH (ref 0.3–1.2)
Total Protein: 9 g/dL — ABNORMAL HIGH (ref 6.5–8.1)

## 2020-01-23 LAB — SARS CORONAVIRUS 2 BY RT PCR (HOSPITAL ORDER, PERFORMED IN ~~LOC~~ HOSPITAL LAB): SARS Coronavirus 2: NEGATIVE

## 2020-01-23 LAB — GLUCOSE, CAPILLARY
Glucose-Capillary: 140 mg/dL — ABNORMAL HIGH (ref 70–99)
Glucose-Capillary: 144 mg/dL — ABNORMAL HIGH (ref 70–99)
Glucose-Capillary: 148 mg/dL — ABNORMAL HIGH (ref 70–99)
Glucose-Capillary: 152 mg/dL — ABNORMAL HIGH (ref 70–99)
Glucose-Capillary: 154 mg/dL — ABNORMAL HIGH (ref 70–99)
Glucose-Capillary: 200 mg/dL — ABNORMAL HIGH (ref 70–99)
Glucose-Capillary: 224 mg/dL — ABNORMAL HIGH (ref 70–99)
Glucose-Capillary: 231 mg/dL — ABNORMAL HIGH (ref 70–99)

## 2020-01-23 LAB — HEMOGLOBIN A1C
Hgb A1c MFr Bld: 10.1 % — ABNORMAL HIGH (ref 4.8–5.6)
Mean Plasma Glucose: 243.17 mg/dL

## 2020-01-23 LAB — LIPASE, BLOOD: Lipase: 31 U/L (ref 11–51)

## 2020-01-23 LAB — BETA-HYDROXYBUTYRIC ACID: Beta-Hydroxybutyric Acid: 4.49 mmol/L — ABNORMAL HIGH (ref 0.05–0.27)

## 2020-01-23 MED ORDER — SODIUM CHLORIDE 0.9 % IV BOLUS
1000.0000 mL | Freq: Once | INTRAVENOUS | Status: AC
  Administered 2020-01-23: 1000 mL via INTRAVENOUS

## 2020-01-23 MED ORDER — SODIUM CHLORIDE 0.9 % IV SOLN: INTRAVENOUS | Status: DC

## 2020-01-23 MED ORDER — INSULIN REGULAR(HUMAN) IN NACL 100-0.9 UT/100ML-% IV SOLN
INTRAVENOUS | Status: DC
  Administered 2020-01-23: 9 [IU]/h via INTRAVENOUS
  Filled 2020-01-23: qty 100

## 2020-01-23 MED ORDER — FENTANYL CITRATE (PF) 100 MCG/2ML IJ SOLN
100.0000 ug | Freq: Once | INTRAMUSCULAR | Status: AC
  Administered 2020-01-23: 100 ug via INTRAVENOUS
  Filled 2020-01-23: qty 2

## 2020-01-23 MED ORDER — ONDANSETRON HCL 4 MG/2ML IJ SOLN
4.0000 mg | Freq: Four times a day (QID) | INTRAMUSCULAR | Status: DC | PRN
  Administered 2020-01-23 – 2020-01-24 (×3): 4 mg via INTRAVENOUS
  Filled 2020-01-23 (×3): qty 2

## 2020-01-23 MED ORDER — ROSUVASTATIN CALCIUM 20 MG PO TABS
20.0000 mg | ORAL_TABLET | Freq: Every day | ORAL | Status: DC
  Administered 2020-01-24 – 2020-01-25 (×2): 20 mg via ORAL
  Filled 2020-01-23 (×3): qty 1

## 2020-01-23 MED ORDER — DEXTROSE-NACL 5-0.45 % IV SOLN: INTRAVENOUS | Status: DC

## 2020-01-23 MED ORDER — LAMOTRIGINE 100 MG PO TABS
150.0000 mg | ORAL_TABLET | Freq: Every day | ORAL | Status: DC
  Administered 2020-01-24 – 2020-01-25 (×2): 150 mg via ORAL
  Filled 2020-01-23: qty 1
  Filled 2020-01-23: qty 2

## 2020-01-23 MED ORDER — ALPRAZOLAM 0.5 MG PO TABS: 0.5000 mg | ORAL_TABLET | ORAL | Status: DC | PRN

## 2020-01-23 MED ORDER — INSULIN ASPART 100 UNIT/ML ~~LOC~~ SOLN
3.0000 [IU] | Freq: Three times a day (TID) | SUBCUTANEOUS | Status: DC
  Administered 2020-01-24 – 2020-01-25 (×4): 3 [IU] via SUBCUTANEOUS
  Filled 2020-01-23 (×4): qty 1

## 2020-01-23 MED ORDER — INSULIN GLARGINE 100 UNIT/ML ~~LOC~~ SOLN: 20.0000 [IU] | SUBCUTANEOUS | Status: DC

## 2020-01-23 MED ORDER — INSULIN REGULAR(HUMAN) IN NACL 100-0.9 UT/100ML-% IV SOLN: INTRAVENOUS | Status: DC

## 2020-01-23 MED ORDER — POTASSIUM CHLORIDE 10 MEQ/100ML IV SOLN
10.0000 meq | INTRAVENOUS | Status: DC
  Filled 2020-01-23: qty 100

## 2020-01-23 MED ORDER — INSULIN ASPART 100 UNIT/ML ~~LOC~~ SOLN
0.0000 [IU] | Freq: Three times a day (TID) | SUBCUTANEOUS | Status: DC
  Administered 2020-01-24 – 2020-01-25 (×4): 2 [IU] via SUBCUTANEOUS
  Filled 2020-01-23 (×4): qty 1

## 2020-01-23 MED ORDER — DEXTROSE 50 % IV SOLN: 0.0000 mL | INTRAVENOUS | Status: DC | PRN

## 2020-01-23 MED ORDER — PROMETHAZINE HCL 25 MG RE SUPP
25.0000 mg | Freq: Four times a day (QID) | RECTAL | Status: DC | PRN
  Administered 2020-01-23: 25 mg via RECTAL
  Filled 2020-01-23 (×2): qty 1

## 2020-01-23 MED ORDER — INSULIN ASPART 100 UNIT/ML ~~LOC~~ SOLN: 0.0000 [IU] | Freq: Every day | SUBCUTANEOUS | Status: DC

## 2020-01-23 MED ORDER — PANTOPRAZOLE SODIUM 40 MG PO TBEC
40.0000 mg | DELAYED_RELEASE_TABLET | Freq: Every day | ORAL | Status: DC
  Administered 2020-01-24 – 2020-01-25 (×2): 40 mg via ORAL
  Filled 2020-01-23 (×2): qty 1

## 2020-01-23 MED ORDER — INSULIN GLARGINE 100 UNIT/ML ~~LOC~~ SOLN
15.0000 [IU] | SUBCUTANEOUS | Status: DC
  Administered 2020-01-23 – 2020-01-25 (×2): 15 [IU] via SUBCUTANEOUS
  Filled 2020-01-23 (×4): qty 0.15

## 2020-01-23 MED ORDER — ENOXAPARIN SODIUM 40 MG/0.4ML ~~LOC~~ SOLN
40.0000 mg | Freq: Two times a day (BID) | SUBCUTANEOUS | Status: DC
  Administered 2020-01-23 – 2020-01-25 (×4): 40 mg via SUBCUTANEOUS
  Filled 2020-01-23 (×4): qty 0.4

## 2020-01-23 MED ORDER — ACETAMINOPHEN 325 MG PO TABS: 650.0000 mg | ORAL_TABLET | Freq: Four times a day (QID) | ORAL | Status: DC | PRN

## 2020-01-23 MED ORDER — VILAZODONE HCL 40 MG PO TABS
40.0000 mg | ORAL_TABLET | Freq: Every day | ORAL | Status: DC
  Administered 2020-01-24 – 2020-01-25 (×2): 40 mg via ORAL
  Filled 2020-01-23 (×3): qty 1

## 2020-01-23 MED ORDER — OLANZAPINE 5 MG PO TABS
5.0000 mg | ORAL_TABLET | Freq: Every day | ORAL | Status: DC
  Administered 2020-01-23 – 2020-01-24 (×2): 5 mg via ORAL
  Filled 2020-01-23 (×3): qty 1

## 2020-01-23 MED ORDER — POTASSIUM CHLORIDE 10 MEQ/100ML IV SOLN
10.0000 meq | INTRAVENOUS | Status: AC
  Administered 2020-01-23 (×2): 10 meq via INTRAVENOUS
  Filled 2020-01-23: qty 100

## 2020-01-23 MED ORDER — SODIUM CHLORIDE 0.9% FLUSH: 3.0000 mL | Freq: Once | INTRAVENOUS | Status: DC

## 2020-01-23 MED ORDER — OXYCODONE-ACETAMINOPHEN 7.5-325 MG PO TABS
1.0000 | ORAL_TABLET | ORAL | Status: DC | PRN
  Administered 2020-01-24 (×2): 1 via ORAL
  Filled 2020-01-23 (×2): qty 1

## 2020-01-23 MED ORDER — ONDANSETRON HCL 4 MG/2ML IJ SOLN
4.0000 mg | Freq: Once | INTRAMUSCULAR | Status: AC
  Administered 2020-01-23: 4 mg via INTRAVENOUS
  Filled 2020-01-23: qty 2

## 2020-01-23 MED ORDER — SODIUM CHLORIDE 0.9 % IV BOLUS
1000.0000 mL | INTRAVENOUS | Status: AC
  Administered 2020-01-23: 1000 mL via INTRAVENOUS

## 2020-01-23 NOTE — ED Notes (Signed)
Checked back with pharmacy again regarding lantus

## 2020-01-23 NOTE — Progress Notes (Signed)
PHARMACIST - PHYSICIAN COMMUNICATION  CONCERNING:  Enoxaparin (Lovenox) for DVT Prophylaxis    RECOMMENDATION: Patient was prescribed enoxaparin 40mg  q24 hours for VTE prophylaxis.   Filed Weights   01/23/20 0921  Weight: (!) 145.2 kg (320 lb)    Body mass index is 42.22 kg/m.  Estimated Creatinine Clearance: 116.6 mL/min (A) (by C-G formula based on SCr of 1.25 mg/dL (H)).   Based on [place] patient is candidate for enoxaparin 40mg  every 12 hour dosing due to BMI being >40.  DESCRIPTION: Pharmacy has adjusted enoxaparin dose per [facility] policy.  Patient is now receiving enoxaparin 40mg  every 12 hours.   Benita Gutter 01/23/2020 2:52 PM

## 2020-01-23 NOTE — ED Notes (Addendum)
Pt states coming in for nausea and diarrhea since Thursday . Pt states he is a diabetic. Pt resting in bed. Pt states no nausea medication we have given him here is working. Pt on hospital bed with cardiac, bp and pulse ox monitor.  Lights dimmed, bed in low position to help pt rest. Prior RN stated pt was sleeping prior to transporting pt to c-pod.

## 2020-01-23 NOTE — ED Notes (Signed)
Pt was moved over to hospital bed a little while ago, pt also given toothbrush and and toothpaste and helped to change into hospital gown.  Ice chips given.  Pt is resting, medicated for nausea.

## 2020-01-23 NOTE — ED Notes (Signed)
Np advises that she will order transition orders.  Await this

## 2020-01-23 NOTE — ED Notes (Signed)
Programmed the Insulin and IVF (d5-0.45%NS) for 2 hours.  Will check CBG and cover with SSI at that time and then shut off insuling drip and change IV fluids to NS.

## 2020-01-23 NOTE — ED Notes (Signed)
Pharmacy called about past due medications and missing meds, including zyprexa. Pharmacy stated they will go into [facility] and assess and possible retime and adjust medications.

## 2020-01-23 NOTE — ED Notes (Signed)
MD notified regarding BMP.  Await Transition orders.  Pt is resting, no distress at this time.

## 2020-01-23 NOTE — ED Provider Notes (Addendum)
[facility] Emergency Department Provider Note  Time seen: 1:18 PM  I have reviewed the triage vital signs and the nursing notes.   HISTORY  Chief Complaint Emesis   HPI Raymond Rose is a 44 y.o. male with a past medical history of anxiety, depression, diabetes, presents to the emergency department for nausea vomiting and abdominal discomfort.  According to the patient over the past 4 days patient has been nauseated with frequent episodes of vomiting unable to keep down any food or fluids.  Patient states his blood sugars have been somewhat elevated at home but not significantly elevated.  Has some upper abdominal discomfort which he relates to the vomiting.  Occasional episodes of diarrhea.  Patient denies any cough but does state slight shortness of breath.  No fever.  Patient states he has received Covid vaccinations as well as had Covid in the past.   Past Medical History:  Diagnosis Date  . Anxiety   . Arthritis   . Depression   . Diabetes mellitus, type II ([place])   . OSA on CPAP   . Vertigo 01/03/2015    Patient Active Problem List   Diagnosis Date Noted  . At risk for prolonged QT interval syndrome 12/23/2019  . MDD (major depressive disorder), recurrent, in partial remission ([place]) 12/23/2019  . Microalbuminuria 08/23/2019  . Personality disorder, unspecified ([place]) 07/12/2019  . Bereavement 07/12/2019  . DKA (diabetic ketoacidoses) ([place]) 07/03/2019  . Depression with anxiety 07/03/2019  . COVID-19 virus infection 07/03/2019  . Nausea vomiting and diarrhea 07/03/2019  . MDD (major depressive disorder), recurrent episode, moderate ([place]) 04/21/2019  . GAD (generalized anxiety disorder) 04/21/2019  . Social anxiety disorder 04/21/2019  . Erectile dysfunction 10/03/2017  . Anxiety 08/08/2017  . Elevated transaminase level 08/28/2016  . Rectus diastasis 02/14/2016  . 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 Panic attacks 06/22/2015  .  Depression 06/22/2015  . Diabetes mellitus ([place]) 01/03/2015  . Eating disorder 01/03/2015  . GERD (gastroesophageal reflux disease) 01/03/2015  . Hearing loss 01/03/2015  . Leg cramps 01/03/2015  . Low back pain 01/03/2015  . Adrenal mass, right ([place]) 06/18/2012  . Obstructive sleep apnea 07/08/2010  . Cluster headache 12/01/2008  . Fam hx-ischem heart disease 10/03/2008  . Morbid obesity ([place]) 09/30/2008  . Hyperlipidemia, mixed 12/12/2004    Past Surgical History:  Procedure Laterality Date  . 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 CHOLECYSTECTOMY  2010   lap Cholecystectomy  . 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 CT scan of Brain  12/23/2011   without contrast, [facility], Normal  . TONSILLECTOMY  1985    Prior to Admission medications   Medication Sig Start Date End Date Taking? Authorizing Provider  ALPRAZolam (XANAX) 0.5 MG tablet TAKE 1 TABLET (0.5 MG TOTAL) BY MOUTH EVERY 4 (FOUR) HOURS AS NEEDED. FOR ANXIETY 12/10/19   Birdie Sons, MD  FARXIGA 10 MG TABS tablet TAKE 1 TABLET BY MOUTH EVERY DAY 12/27/19   Birdie Sons, MD  fluconazole (DIFLUCAN) 150 MG tablet TAKE 1 TABLET BY MOUTH AS ONE DOSE 12/10/19   Birdie Sons, MD  glipiZIDE (GLUCOTROL XL) 10 MG 24 hr tablet Take 1 tablet (10 mg total) by mouth daily with breakfast. 11/18/19   Birdie Sons, MD  ketoconazole (NIZORAL) 2 % cream APPLY TO AFFECTED AREA ONCE DAILY FOR 7 DAYS AS  NEEDED Patient taking differently: Apply 1 application topically daily. APPLY TO AFFECTED AREA ONCE DAILY FOR 7 DAYS AS NEEDED 03/21/19   Birdie Sons, MD  lamoTRIgine (LAMICTAL) 150 MG tablet Take 1 tablet (150 mg total) by mouth daily. 12/23/19   Ursula Alert, MD  metFORMIN (GLUCOPHAGE-XR) 500 MG 24 hr tablet Take 2 tablets (1,000 mg total) by mouth 2 (two) times daily. 01/10/20   Birdie Sons, MD  OLANZapine (ZYPREXA) 5 MG tablet Take by  mouth. 12/10/19 03/09/20  [provider]  omeprazole (PRILOSEC) 40 MG capsule TAKE 1 CAPSULE (40 MG TOTAL) BY MOUTH DAILY. 12/21/19   Birdie Sons, MD  ondansetron (ZOFRAN) 4 MG tablet Take 1 tablet (4 mg total) by mouth daily as needed for nausea or vomiting. 07/05/19 07/04/20  Sidney Ace, MD  oxyCODONE-acetaminophen (PERCOCET) 7.5-325 MG tablet Take 1 tablet by mouth every 4 (four) hours as needed. 01/18/20   Birdie Sons, MD  predniSONE (DELTASONE) 10 MG tablet TAKE 6 TABLETS FOR 2 DAYS, THEN 5 FOR 2 DAYS, THEN 4 FOR 2 DAYS, THEN 3 FOR 2 DAYS, THEN 2 FOR 2 DAYS, THEN 1 FOR 2 DAYS. 11/29/19   Birdie Sons, MD  promethazine (PHENERGAN) 25 MG tablet TAKE 1 TABLET (25 MG TOTAL) BY MOUTH EVERY 6 (SIX) HOURS AS NEEDED FOR NAUSEA OR VOMITING. 10/13/19   Birdie Sons, MD  rosuvastatin (CRESTOR) 20 MG tablet TAKE 1 TABLET BY MOUTH EVERY DAY 12/20/19   Birdie Sons, MD  sildenafil (VIAGRA) 50 MG tablet TAKE ONE OR TWO TABLETS AS NEEDED, NO MORE THAN 2 IN A DAY INSURANCE COVERS 6 TAB IN 25 DAYS Patient not taking: Reported on 01/10/2020 08/02/19   Birdie Sons, MD  TRULICITY 1.5 AY/3.0ZS SOPN INJECT 1.5 MG INTO THE SKIN ONCE A WEEK. 07/08/19   Birdie Sons, MD  Vilazodone HCl (VIIBRYD) 40 MG TABS Take 1 tablet (40 mg total) by mouth daily. 12/23/19   Ursula Alert, MD    Allergies  Allergen Reactions  . Bupropion     Extreme anxiety  . Morphine     Family History  Problem Relation Age of Onset  . Depression Mother   . Heart attack Father 72  . Arthritis Other   . Alcohol abuse Other   . Lung cancer Other   . Hyperlipidemia Other   . CAD Other   . Stroke Other   . Hypertension Other   . Bipolar disorder Other     Social History Social History   Tobacco Use  . Smoking status: Former Smoker    Years: 5.00    Quit date: 06/17/1997    Years since quitting: 22.6  . Smokeless tobacco: Never Used  . Tobacco comment: started smoking at ahe 16, and quit at age  6  Vaping Use  . Vaping Use: Never used  Substance Use Topics  . Alcohol use: Yes    Alcohol/week: 0.0 standard drinks    Comment: occasional alcohol use  . Drug use: No    Review of Systems Constitutional: Negative for fever. Cardiovascular: Negative for chest pain. Respiratory: Mild shortness of breath Gastrointestinal: Upper abdominal discomfort, nausea vomiting, occasional diarrhea Genitourinary: Negative for urinary compaints Musculoskeletal: Negative for musculoskeletal complaints Neurological: Negative for headache All other ROS negative  ____________________________________________   PHYSICAL EXAM:  VITAL SIGNS: ED Triage Vitals  Enc Vitals Group     BP 01/23/20 0921 (!) 144/94     Pulse Rate 01/23/20 0921 Marland Kitchen)  126     Resp 01/23/20 0921 20     Temp 01/23/20 0921 98 F (36.7 C)     Temp Source 01/23/20 0921 Oral     SpO2 01/23/20 0921 98 %     Weight 01/23/20 0921 (!) 320 lb (145.2 kg)     Height 01/23/20 0921 6\' 1"  (1.854 m)     Head Circumference --      Peak Flow --      Pain Score 01/23/20 0931 5     Pain Loc --      Pain Edu? --      Excl. in [place]? --     Constitutional: Alert and oriented. Well appearing and in no distress. Eyes: Normal exam ENT      Head: Normocephalic and atraumatic.      Mouth/Throat: Dry appearing mucous membranes Cardiovascular: Regular rhythm, rate around 120.  No obvious murmur. Respiratory: Normal respiratory effort without tachypnea nor retractions. Breath sounds are clear  Gastrointestinal: Soft, mild epigastric tenderness palpation without rebound guarding or distention. Musculoskeletal: Nontender with normal range of motion in all extremities.  Neurologic:  Normal speech and language. No gross focal neurologic deficits  Skin:  Skin is warm, dry and intact.  Psychiatric: Mood and affect are normal.    EKG viewed and interpreted by myself shows sinus tachycardia at 108 bpm with a narrow QRS, normal axis, normal  intervals, no concerning ST changes occasional PVC.   INITIAL IMPRESSION / ASSESSMENT AND PLAN / ED COURSE  Pertinent labs & imaging results that were available during my care of the patient were reviewed by me and considered in my medical decision making (see chart for details).   Patient presents emergency department for nausea vomiting.  Patient appears dehydrated with dry mucous membranes tachycardic.  Patient's lab work has resulted showing an elevated blood glucose of 329 with a anion gap of 25 and a mild leukocytosis.  Patient's labs are concerning for DKA.  Patient states he has had DKA once previously in January when he was diagnosed with Covid.  We will continue with IV hydration in the emergency department we will treat pain and nausea and obtain a VBG.  Anticipate the patient will likely require insulin infusion and admission to the hospital for likely DKA.  VBG pH of seven-point.  We will start the patient on insulin infusion.  Patient receiving IV fluids.  Patient will be admitted to the hospitalist service for further work-up and treatment.  Toa Mia Clemon was evaluated in Emergency Department on 01/23/2020 for the symptoms described in the history of present illness. He was evaluated in the context of the global COVID-19 pandemic, which necessitated consideration that the patient might be at risk for infection with the SARS-CoV-2 virus that causes COVID-19. Institutional protocols and algorithms that pertain to the evaluation of patients at risk for COVID-19 are in a state of rapid change based on information released by regulatory bodies including the CDC and federal and state organizations. These policies and algorithms were followed during the patient's care in the ED.  CRITICAL CARE Performed by: Harvest Dark   Total critical care time: 30 minutes  Critical care time was exclusive of separately billable procedures and treating other patients.  Critical care was necessary  to treat or prevent imminent or life-threatening deterioration.  Critical care was time spent personally by me on the following activities: development of treatment plan with patient and/or surrogate as well as nursing, discussions with consultants, evaluation of  patient's response to treatment, examination of patient, obtaining history from patient or surrogate, ordering and performing treatments and interventions, ordering and review of laboratory studies, ordering and review of radiographic studies, pulse oximetry and re-evaluation of patient's condition.  ____________________________________________   FINAL CLINICAL IMPRESSION(S) / ED DIAGNOSES  Nausea vomiting Diabetic ketoacidosis   Harvest Dark, MD 01/23/20 1334    Harvest Dark, MD 01/23/20 1349

## 2020-01-23 NOTE — H&P (Signed)
History and Physical    Raymond Rose DJM:[id number] DOB: 17-Aug-1975 DOA: 01/23/2020  PCP: Birdie Sons, MD Consultants:  Shea Evans - psychiatry; Denice Paradise - GI Patient coming from:  Home - lives with wife; NOK: Wife, Raymond Rose, [phone]  Chief Complaint: n/v  HPI: Raymond Rose is a 44 y.o. male with medical history significant of OSA on CPAP; DM; and morbid obesity (BMI 42.22) presenting with n/v.  He reports that since Thursday PM he has had n/v, very dizzy, myalgias.  Last night, he noted SOB.  He had significant difficulty getting down the stairs today.  The symptoms are very similar to when he had COVID in January.  He reports that his glucose was 150 yesterday.  He had DKA on presentation in January as well.  Last A1c was maybe 9.    ED Course:  DKA.  N/V since Thursday, unable to take PO.  Glucose 300, pH 7.07, CO2 10.  Started on insulin drip.  COVID pending but had COVID in January and then was vaccinated.  Review of Systems: As per HPI; otherwise review of systems reviewed and negative.   Ambulatory Status:  Ambulates without assistance  COVID Vaccine Status:   Complete  Past Medical History:  Diagnosis Date  . Anxiety   . Arthritis   . COVID-19 06/2019  . Cyclical vomiting   . Depression   . Diabetes mellitus, type II ([place])   . OSA on CPAP   . Vertigo 01/03/2015    Past Surgical History:  Procedure Laterality Date  . 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 CHOLECYSTECTOMY  2010   lap Cholecystectomy  . 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 CT scan of Brain  12/23/2011   without contrast, [facility], Normal  . TONSILLECTOMY  1985    Social History   Socioeconomic History  . Marital status: Married    Spouse name: Not on file  . Number of children: Not on file  . Years of education: Not on file  . Highest education level: Not on file  Occupational History  . Occupation:  Banking    Comment: Engineer, manufacturing  Tobacco Use  . Smoking status: Former Smoker    Years: 5.00    Quit date: 06/17/1997    Years since quitting: 22.6  . Smokeless tobacco: Never Used  . Tobacco comment: started smoking at ahe 16, and quit at age 6  Vaping Use  . Vaping Use: Never used  Substance and Sexual Activity  . Alcohol use: Yes    Alcohol/week: 0.0 standard drinks    Comment: occasional alcohol use  . Drug use: No  . Sexual activity: Not on file  Other Topics Concern  . Not on file  Social History Narrative  . Not on file   Social Determinants of Health   Financial Resource Strain:   . Difficulty of Paying Living Expenses:   Food Insecurity:   . Worried About Charity fundraiser in the Last Year:   . Arboriculturist in the Last Year:   Transportation Needs:   . Film/video editor (Medical):   Marland Kitchen Lack of Transportation (Non-Medical):   Physical Activity:   . Days of Exercise per Week:   . Minutes of Exercise per Session:   Stress:   . Feeling of Stress :   Social Connections:   . Frequency  of Communication with Friends and Family:   . Frequency of Social Gatherings with Friends and Family:   . Attends Religious Services:   . Active Member of Clubs or Organizations:   . Attends Archivist Meetings:   Marland Kitchen Marital Status:   Intimate Partner Violence:   . Fear of Current or Ex-Partner:   . Emotionally Abused:   Marland Kitchen Physically Abused:   . Sexually Abused:     Allergies  Allergen Reactions  . Bupropion     Extreme anxiety  . Morphine     Family History  Problem Relation Age of Onset  . Depression Mother   . Heart attack Father 21  . Arthritis Other   . Alcohol abuse Other   . Lung cancer Other   . Hyperlipidemia Other   . CAD Other   . Stroke Other   . Hypertension Other   . Bipolar disorder Other     Prior to Admission medications   Medication Sig Start Date End Date Taking? Authorizing Provider  ALPRAZolam (XANAX) 0.5 MG tablet TAKE 1  TABLET (0.5 MG TOTAL) BY MOUTH EVERY 4 (FOUR) HOURS AS NEEDED. FOR ANXIETY 12/10/19   Birdie Sons, MD  FARXIGA 10 MG TABS tablet TAKE 1 TABLET BY MOUTH EVERY DAY 12/27/19   Birdie Sons, MD  fluconazole (DIFLUCAN) 150 MG tablet TAKE 1 TABLET BY MOUTH AS ONE DOSE 12/10/19   Birdie Sons, MD  glipiZIDE (GLUCOTROL XL) 10 MG 24 hr tablet Take 1 tablet (10 mg total) by mouth daily with breakfast. 11/18/19   Birdie Sons, MD  ketoconazole (NIZORAL) 2 % cream APPLY TO AFFECTED AREA ONCE DAILY FOR 7 DAYS AS NEEDED Patient taking differently: Apply 1 application topically daily. APPLY TO AFFECTED AREA ONCE DAILY FOR 7 DAYS AS NEEDED 03/21/19   Birdie Sons, MD  lamoTRIgine (LAMICTAL) 150 MG tablet Take 1 tablet (150 mg total) by mouth daily. 12/23/19   Ursula Alert, MD  metFORMIN (GLUCOPHAGE-XR) 500 MG 24 hr tablet Take 2 tablets (1,000 mg total) by mouth 2 (two) times daily. 01/10/20   Birdie Sons, MD  OLANZapine (ZYPREXA) 5 MG tablet Take by mouth. 12/10/19 03/09/20  [provider]  omeprazole (PRILOSEC) 40 MG capsule TAKE 1 CAPSULE (40 MG TOTAL) BY MOUTH DAILY. 12/21/19   Birdie Sons, MD  ondansetron (ZOFRAN) 4 MG tablet Take 1 tablet (4 mg total) by mouth daily as needed for nausea or vomiting. 07/05/19 07/04/20  Sidney Ace, MD  oxyCODONE-acetaminophen (PERCOCET) 7.5-325 MG tablet Take 1 tablet by mouth every 4 (four) hours as needed. 01/18/20   Birdie Sons, MD  predniSONE (DELTASONE) 10 MG tablet TAKE 6 TABLETS FOR 2 DAYS, THEN 5 FOR 2 DAYS, THEN 4 FOR 2 DAYS, THEN 3 FOR 2 DAYS, THEN 2 FOR 2 DAYS, THEN 1 FOR 2 DAYS. 11/29/19   Birdie Sons, MD  promethazine (PHENERGAN) 25 MG tablet TAKE 1 TABLET (25 MG TOTAL) BY MOUTH EVERY 6 (SIX) HOURS AS NEEDED FOR NAUSEA OR VOMITING. 10/13/19   Birdie Sons, MD  rosuvastatin (CRESTOR) 20 MG tablet TAKE 1 TABLET BY MOUTH EVERY DAY 12/20/19   Birdie Sons, MD  sildenafil (VIAGRA) 50 MG tablet TAKE ONE OR TWO TABLETS AS  NEEDED, NO MORE THAN 2 IN A DAY INSURANCE COVERS 6 TAB IN 25 DAYS Patient not taking: Reported on 01/10/2020 08/02/19   Birdie Sons, MD  TRULICITY 1.5 OX/7.3ZH SOPN INJECT 1.5 MG INTO  THE SKIN ONCE A WEEK. 07/08/19   Birdie Sons, MD  Vilazodone HCl (VIIBRYD) 40 MG TABS Take 1 tablet (40 mg total) by mouth daily. 12/23/19   Ursula Alert, MD    Physical Exam: Vitals:   01/23/20 0921  BP: (!) 144/94  Pulse: (!) 126  Resp: 20  Temp: 98 F (36.7 C)  TempSrc: Oral  SpO2: 98%  Weight: (!) 145.2 kg  Height: 6\' 1"  (1.854 m)     . General:  Appears calm and comfortable and is NAD . Eyes:  PERRL, EOMI, normal lids, iris . ENT:  grossly normal hearing, lips & tongue, mmm; appropriate dentition . Neck:  no LAD, masses or thyromegaly; no carotid bruits . Cardiovascular:  RRR, no m/r/g. No LE edema.  Marland Kitchen Respiratory:   CTA bilaterally with no wheezes/rales/rhonchi.  Normal respiratory effort. . Abdomen:  soft, NT, ND, NABS . Back:   normal alignment, no CVAT . Skin:  no rash or induration seen on limited exam . Musculoskeletal:  grossly normal tone BUE/BLE, good ROM, no bony abnormality . Lower extremity:  No LE edema.  Limited foot exam with no ulcerations.  2+ distal pulses. Marland Kitchen Psychiatric:  grossly normal mood and affect, speech fluent and appropriate, AOx3 . Neurologic:  CN 2-12 grossly intact, moves all extremities in coordinated fashion, sensation intact    Radiological Exams on Admission: No results found.  EKG: Independently reviewed.  Sinus tachycardia with rate 108; PVCs, low voltage with no evidence of acute ischemia   Labs on Admission: I have personally reviewed the available labs and imaging studies at the time of the admission.  Pertinent labs:   VBG: 7.07/31/9.0 CO2 10 Glucose 329 BUN 17/Creatinine 1.25/GFR >60 Anion gap 25 WBC 14.3 A1c 9.7 7/26   Assessment/Plan Principal Problem:   DKA (diabetic ketoacidosis) ([place]) Active Problems:   Hyperlipidemia,  mixed   Obesity, Class III, BMI 40-49.9 (morbid obesity) ([place])   Obstructive sleep apnea   Depression with anxiety   Chronic pain    DKA -Patient with poor baseline control (recent A1c 9.7) -Developed n/v starting on Thursday with progressive lightheadedness, SOB -No indication of illness as source -Otherwise no apparent reason for DKA -Moderate DKA on admission based on pH 7.07, HCO3 10, anion gap 25 patient alert  -Will observe in SDU with DKA protocol -Would recommend continuing insulin drip at least until morning regardless of rapidity of closure of gap and normalization of labs -K+ normal at time of admission and so potassium supplementation added -IVF at 150 cc/hr, NS until glucose <250 and then decrease rate to 125 and change to D51/2NS -Diabetes coordinator consult requested -Of note, he is reportedly taking Glucotrol, Farxiga, Glucophage, and Trulicity - and yet appears to have not taken these medications in the past week.  OSA on CPAP -Continue CPAP  Chronic pain -I have reviewed this patient in the [place] Controlled Substances Reporting System.  He is receiving medications from only one provider and appears to be taking them as prescribed. -He is not at particularly high risk of opioid misuse, diversion, or overdose. -Continue Percocet without plan for escalation including parenteral opioids  Morbid obesity Body mass index is 42.22 kg/m.  -Weight loss should be encouraged -Outpatient PCP/bariatric medicine/bariatric surgery f/u encouraged  Depression with anxiety -Continue Xanax, Lamictal, Zyprexa, Viibryd  HLD -Continue Crestor   Note: This patient has been tested and is pending for the novel coronavirus COVID-19; he had COVID infection in January and has since been vaccinated.  DVT prophylaxis:  Lovenox  Code Status:  Full - confirmed with patient Family Communication: None present Disposition Plan:  The patient is from: home  Anticipated d/c is to: home  without [facility] services   Anticipated d/c date will depend on clinical response to treatment, but possibly as early as tomorrow if he has excellent response to treatment  Patient is currently: acutely ill Consults called: Diabetes coordinator Admission status:  It is my clinical opinion that referral for OBSERVATION is reasonable and necessary in this patient based on the above information provided. The aforementioned taken together are felt to place the patient at high risk for further clinical deterioration. However it is anticipated that the patient may be medically stable for discharge from the hospital within 24 to 48 hours.    Karmen Bongo MD [facility]   How to contact the [facility] Attending or Consulting provider [place] or covering provider during after hours [place], for this patient?  1. Check the care team in [facility] and look for a) attending/consulting TR[facility] provider listed and b) the [facility] team listed 2. Log into [email or web address] and use [place]'s universal password to access. If you do not have the password, please contact the hospital operator. 3. Locate the [facility] provider you are looking for under [facility] and page to a number that you can be directly reached. 4. If you still have difficulty reaching the provider, please page the [facility] (Director on Call) for the Hospitalists listed on amion for assistance.   01/23/2020, 2:20 PM

## 2020-01-23 NOTE — ED Notes (Signed)
Provider and on coming RN included in chart: Duncan. I just took over for this pt and am now going to be handing this pt off. Pt was a DKA work up and I see that he was supposed to be transitioning off the insulin drip. The pump was saying infusion was done, and that it was at 6units/hour. It was supposed to be at 3units/hr and I am unsure if there was a pump malfunction or error in changing it to 3units an hour at 2114. Candace is taking over for me. glucose is 148. Would you like [place] to turn off the insulin gtt or take it to the 3 units for another hour. We are supposed to end the insulin gtt at 2353

## 2020-01-23 NOTE — ED Triage Notes (Signed)
Pt to ed via pov for emesis since Thursday. Pt states that he has had diarrhea as well. Pt is tachy in triage.

## 2020-01-23 NOTE — ED Notes (Signed)
Pharmacy notified for Lantus

## 2020-01-24 ENCOUNTER — Other Ambulatory Visit: Payer: Self-pay

## 2020-01-24 DIAGNOSIS — G894 Chronic pain syndrome: Secondary | ICD-10-CM | POA: Diagnosis present

## 2020-01-24 DIAGNOSIS — F418 Other specified anxiety disorders: Secondary | ICD-10-CM | POA: Diagnosis present

## 2020-01-24 DIAGNOSIS — G4733 Obstructive sleep apnea (adult) (pediatric): Secondary | ICD-10-CM | POA: Diagnosis present

## 2020-01-24 DIAGNOSIS — E782 Mixed hyperlipidemia: Secondary | ICD-10-CM | POA: Diagnosis present

## 2020-01-24 DIAGNOSIS — Z8249 Family history of ischemic heart disease and other diseases of the circulatory system: Secondary | ICD-10-CM | POA: Diagnosis not present

## 2020-01-24 DIAGNOSIS — Z7984 Long term (current) use of oral hypoglycemic drugs: Secondary | ICD-10-CM | POA: Diagnosis not present

## 2020-01-24 DIAGNOSIS — E876 Hypokalemia: Secondary | ICD-10-CM | POA: Diagnosis not present

## 2020-01-24 DIAGNOSIS — F419 Anxiety disorder, unspecified: Secondary | ICD-10-CM | POA: Diagnosis present

## 2020-01-24 DIAGNOSIS — E1110 Type 2 diabetes mellitus with ketoacidosis without coma: Secondary | ICD-10-CM | POA: Diagnosis present

## 2020-01-24 DIAGNOSIS — F329 Major depressive disorder, single episode, unspecified: Secondary | ICD-10-CM | POA: Diagnosis present

## 2020-01-24 DIAGNOSIS — Z8616 Personal history of COVID-19: Secondary | ICD-10-CM | POA: Diagnosis not present

## 2020-01-24 DIAGNOSIS — Z794 Long term (current) use of insulin: Secondary | ICD-10-CM | POA: Diagnosis not present

## 2020-01-24 DIAGNOSIS — Z6841 Body Mass Index (BMI) 40.0 and over, adult: Secondary | ICD-10-CM | POA: Diagnosis not present

## 2020-01-24 DIAGNOSIS — Z87891 Personal history of nicotine dependence: Secondary | ICD-10-CM | POA: Diagnosis not present

## 2020-01-24 LAB — BASIC METABOLIC PANEL
Anion gap: 14 (ref 5–15)
Anion gap: 10 (ref 5–15)
Anion gap: 12 (ref 5–15)
BUN: 11 mg/dL (ref 6–20)
BUN: 11 mg/dL (ref 6–20)
BUN: 12 mg/dL (ref 6–20)
CO2: 12 mmol/L — ABNORMAL LOW (ref 22–32)
CO2: 14 mmol/L — ABNORMAL LOW (ref 22–32)
CO2: 16 mmol/L — ABNORMAL LOW (ref 22–32)
Calcium: 8.3 mg/dL — ABNORMAL LOW (ref 8.9–10.3)
Calcium: 7.8 mg/dL — ABNORMAL LOW (ref 8.9–10.3)
Calcium: 8.3 mg/dL — ABNORMAL LOW (ref 8.9–10.3)
Chloride: 111 mmol/L (ref 98–111)
Chloride: 111 mmol/L (ref 98–111)
Chloride: 110 mmol/L (ref 98–111)
Creatinine, Ser: 0.85 mg/dL (ref 0.61–1.24)
Creatinine, Ser: 0.82 mg/dL (ref 0.61–1.24)
Creatinine, Ser: 0.91 mg/dL (ref 0.61–1.24)
GFR calc Af Amer: >60 mL/min (ref >60)
GFR calc Af Amer: >60 mL/min (ref >60)
GFR calc Af Amer: >60 mL/min (ref >60)
GFR calc non Af Amer: >60 mL/min (ref >60)
GFR calc non Af Amer: >60 mL/min (ref >60)
GFR calc non Af Amer: >60 mL/min (ref >60)
Glucose, Bld: 178 mg/dL — ABNORMAL HIGH (ref 70–99)
Glucose, Bld: 238 mg/dL — ABNORMAL HIGH (ref 70–99)
Glucose, Bld: 198 mg/dL — ABNORMAL HIGH (ref 70–99)
Potassium: 3.7 mmol/L (ref 3.5–5.1)
Potassium: 3.4 mmol/L — ABNORMAL LOW (ref 3.5–5.1)
Potassium: 3.6 mmol/L (ref 3.5–5.1)
Sodium: 137 mmol/L (ref 135–145)
Sodium: 135 mmol/L (ref 135–145)
Sodium: 138 mmol/L (ref 135–145)

## 2020-01-24 LAB — BETA-HYDROXYBUTYRIC ACID
Beta-Hydroxybutyric Acid: 4.32 mmol/L — ABNORMAL HIGH (ref 0.05–0.27)
Beta-Hydroxybutyric Acid: 3.2 mmol/L — ABNORMAL HIGH (ref 0.05–0.27)

## 2020-01-24 LAB — GLUCOSE, CAPILLARY
Glucose-Capillary: 181 mg/dL — ABNORMAL HIGH (ref 70–99)
Glucose-Capillary: 163 mg/dL — ABNORMAL HIGH (ref 70–99)
Glucose-Capillary: 166 mg/dL — ABNORMAL HIGH (ref 70–99)
Glucose-Capillary: 182 mg/dL — ABNORMAL HIGH (ref 70–99)
Glucose-Capillary: 190 mg/dL — ABNORMAL HIGH (ref 70–99)
Glucose-Capillary: 198 mg/dL — ABNORMAL HIGH (ref 70–99)

## 2020-01-24 LAB — PHOSPHORUS: Phosphorus: 1.8 mg/dL — ABNORMAL LOW (ref 2.5–4.6)

## 2020-01-24 LAB — MAGNESIUM: Magnesium: 1.8 mg/dL (ref 1.7–2.4)

## 2020-01-24 MED ORDER — POTASSIUM CHLORIDE CRYS ER 20 MEQ PO TBCR
20.0000 meq | EXTENDED_RELEASE_TABLET | Freq: Two times a day (BID) | ORAL | Status: DC
  Administered 2020-01-24 – 2020-01-25 (×2): 20 meq via ORAL
  Filled 2020-01-24 (×2): qty 1

## 2020-01-24 MED ORDER — K PHOS MONO-SOD PHOS DI & MONO 155-852-130 MG PO TABS
500.0000 mg | ORAL_TABLET | Freq: Three times a day (TID) | ORAL | Status: DC
  Administered 2020-01-24 – 2020-01-25 (×3): 500 mg via ORAL
  Filled 2020-01-24 (×5): qty 2

## 2020-01-24 NOTE — Progress Notes (Signed)
Report called to Jo, RN

## 2020-01-24 NOTE — Progress Notes (Signed)
Patient sitting on side of bed. Complaining of continued nausea. Patient given ginger ale and iv zofran. No other complaints.

## 2020-01-24 NOTE — Progress Notes (Signed)
PROGRESS NOTE    Raymond Rose  BPZ:[id number] DOB: [birth year] DOA: 01/23/2020 PCP: Birdie Sons, MD    Brief Narrative:  44 year old gentleman with obstructive sleep apnea on CPAP, type 2 diabetes on insulin and morbid obesity with a BMI more than 42 presented to the emergency room with 3 days of nausea and vomiting with no precipitating factors.  Had episode of DKA in January 2021.  No last A1c of 9. In the emergency room, he was found with DKA, anion gap of 25, glucose 300, pH 7.07.  Started on insulin drip and admitted to the hospital.   Assessment & Plan:   Principal Problem:   DKA (diabetic ketoacidosis) ([place]) Active Problems:   Hyperlipidemia, mixed   Obesity, Class III, BMI 40-49.9 (morbid obesity) ([place])   Obstructive sleep apnea   DKA (diabetic ketoacidoses) ([place])   Depression with anxiety   Chronic pain  Diabetic ketoacidosis: Treated with insulin drip with closing of anion gap.  He still has some nausea and is not tolerating solid food yet.  Anion gap is closed, however bicarbonate is 14.  Given severity of symptoms, will monitor today in the hospital with both short acting and long-acting insulin. A1c 10.1 At home, he is on Trulicity 1.5 mg every week, Farxiga 10 mg daily, glipizide 10 mg daily, stopped taking Metformin since 1 month. Once clinically stabilized, will discharge him on continuous dose of his home medications, will add Metformin.  Patient is working on weight loss and lifestyle modification and does not want to change any regimen.  This is reasonable. We will advance diet and challenge him.  Hypokalemia: Replace and monitor levels.  Hypophosphatemia: Replace and monitor levels.  Obstructive sleep apnea: Can use CPAP at night.  He can bring one from home.  Depression anxiety: On Zyprexa and Lamictal that he will continue.   DVT prophylaxis: enoxaparin (LOVENOX) injection 40 mg Start: 01/23/20 2200   Code Status: Full code Family  Communication: None at bedside Disposition Plan: Status is: Inpatient  Remains inpatient appropriate because:IV treatments appropriate due to intensity of illness or inability to take PO and Inpatient level of care appropriate due to severity of illness   Dispo: The patient is from: Home              Anticipated d/c is to: Home              Anticipated d/c date is: 1 day              Patient currently is not medically stable to d/c.  Patient is still has symptoms of nausea.  His anion gap is closed, however his bicarbonate is still low.  He will still need IV fluid administration and challenge with regular diet before discharge.       Consultants:   None  Procedures:   None  Antimicrobials:   None   Subjective: Patient seen and examined.  He was examined 2 times today to see if he is ready to go home.  Still has some nausea but no vomiting.  Was trying to eat some solid food, however he was not able to eat much.  No other overnight events.  Objective: Vitals:   01/24/20 1143 01/24/20 1600 01/24/20 1629 01/24/20 1705  BP: (!) 88/54 (!) 151/128 (!) 109/56 113/67  Pulse: 84 96  80  Resp: 14 17 18    Temp: 97.7 F (36.5 C) 98.2 F (36.8 C) 98.8 F (37.1 C) 98.3 F (36.8 C)  TempSrc: Oral Oral Oral Oral  SpO2: 99% 98% 98% 93%  Weight:      Height:        Intake/Output Summary (Last 24 hours) at 01/24/2020 1739 Last data filed at 01/24/2020 1146 Gross per 24 hour  Intake --  Output 2000 ml  Net -2000 ml   Filed Weights   01/23/20 0921  Weight: (!) 145.2 kg    Examination:  General exam: Appears calm and comfortable, walking around. Respiratory system: Clear to auscultation. Respiratory effort normal. Cardiovascular system: S1 & S2 heard, RRR. No JVD, murmurs, rubs, gallops or clicks. No pedal edema. Gastrointestinal system: Abdomen is nondistended, soft and nontender. No organomegaly or masses felt. Normal bowel sounds heard. Central nervous system: Alert and  oriented. No focal neurological deficits. Extremities: Symmetric 5 x 5 power. Skin: No rashes, lesions or ulcers Psychiatry: Judgement and insight appear normal. Mood & affect appropriate.     Data Reviewed: I have personally reviewed following labs and imaging studies  CBC: Recent Labs  Lab 01/23/20 0933  WBC 14.3*  HGB 17.0  HCT 52.6*  MCV 90.2  PLT 357   Basic Metabolic Panel: Recent Labs  Lab 01/23/20 0933 01/23/20 1701 01/23/20 1938 01/24/20 0607 01/24/20 1130  NA 135 137 138 137 135  K 4.4 3.8 3.8 3.7 3.4*  CL 100 108 112* 111 111  CO2 10* 14* 15* 12* 14*  GLUCOSE 329* 184* 151* 178* 238*  BUN 17 13 12 11 11   CREATININE 1.25* 0.97 0.80 0.85 0.82  CALCIUM 9.2 8.0* 8.0* 8.3* 7.8*  MG  --   --   --   --  1.8  PHOS  --   --   --   --  1.8*   GFR: Estimated Creatinine Clearance: 177.7 mL/min (by C-G formula based on SCr of 0.82 mg/dL). Liver Function Tests: Recent Labs  Lab 01/23/20 0933  AST 20  ALT 21  ALKPHOS 106  BILITOT 2.5*  PROT 9.0*  ALBUMIN 4.9   Recent Labs  Lab 01/23/20 0933  LIPASE 31   No results for input(s): AMMONIA in the last 168 hours. Coagulation Profile: No results for input(s): INR, PROTIME in the last 168 hours. Cardiac Enzymes: No results for input(s): CKTOTAL, CKMB, CKMBINDEX, TROPONINI in the last 168 hours. BNP (last 3 results) No results for input(s): PROBNP in the last 8760 hours. HbA1C: Recent Labs    01/23/20 1553  HGBA1C 10.1*   CBG: Recent Labs  Lab 01/24/20 0303 01/24/20 0602 01/24/20 0737 01/24/20 1146 01/24/20 1703  GLUCAP 181* 163* 166* 182* 190*   Lipid Profile: No results for input(s): CHOL, HDL, LDLCALC, TRIG, CHOLHDL, LDLDIRECT in the last 72 hours. Thyroid Function Tests: No results for input(s): TSH, T4TOTAL, FREET4, T3FREE, THYROIDAB in the last 72 hours. Anemia Panel: No results for input(s): VITAMINB12, FOLATE, FERRITIN, TIBC, IRON, RETICCTPCT in the last 72 hours. Sepsis Labs: No  results for input(s): PROCALCITON, LATICACIDVEN in the last 168 hours.  Recent Results (from the past 240 hour(s))  SARS Coronavirus 2 by RT PCR (hospital order, performed in [facility] hospital lab) Nasopharyngeal Nasopharyngeal Swab     Status: None   Collection Time: 01/23/20  3:53 PM   Specimen: Nasopharyngeal Swab  Result Value Ref Range Status   SARS Coronavirus 2 NEGATIVE NEGATIVE Final    Comment: (NOTE) SARS-CoV-2 target nucleic acids are NOT DETECTED.  The SARS-CoV-2 RNA is generally detectable in upper and lower respiratory specimens during the acute phase of infection.  The lowest concentration of SARS-CoV-2 viral copies this assay can detect is 250 copies / mL. A negative result does not preclude SARS-CoV-2 infection and should not be used as the sole basis for treatment or other patient management decisions.  A negative result may occur with improper specimen collection / handling, submission of specimen other than nasopharyngeal swab, presence of viral mutation(s) within the areas targeted by this assay, and inadequate number of viral copies (<250 copies / mL). A negative result must be combined with clinical observations, patient history, and epidemiological information.  Fact Sheet for Patients:   StrictlyIdeas.no  Fact Sheet for Healthcare Providers: BankingDealers.co.za  This test is not yet approved or  cleared by the [place] FDA and has been authorized for detection and/or diagnosis of SARS-CoV-2 by FDA under an Emergency Use Authorization (EUA).  This EUA will remain in effect (meaning this test can be used) for the duration of the COVID-19 declaration under Section 564(b)(1) of the Act, 21 U.S.C. section 360bbb-3(b)(1), unless the authorization is terminated or revoked sooner.  Performed at [facility], [address] [place], [place] [address]          Radiology Studies: No results  found.      Scheduled Meds: . enoxaparin (LOVENOX) injection  40 mg Subcutaneous Q12H  . insulin aspart  0-5 Units Subcutaneous QHS  . insulin aspart  0-9 Units Subcutaneous TID WC  . insulin aspart  3 Units Subcutaneous TID WC  . insulin glargine  15 Units Subcutaneous Q24H  . lamoTRIgine  150 mg Oral Daily  . OLANZapine  5 mg Oral QHS  . pantoprazole  40 mg Oral Daily  . phosphorus  500 mg Oral TID  . potassium chloride  20 mEq Oral BID  . rosuvastatin  20 mg Oral Daily  . Vilazodone HCl  40 mg Oral Daily   Continuous Infusions: . sodium chloride    . sodium chloride 75 mL/hr at 01/24/20 1412  . dextrose 5 % and 0.45% NaCl Stopped (01/23/20 2354)     LOS: 0 days    Time spent: 30 minutes    Barb Merino, MD [facility] Pager [phone]

## 2020-01-24 NOTE — Progress Notes (Signed)
Inpatient Diabetes Program Recommendations  AACE/ADA: New Consensus Statement on Inpatient Glycemic Control   Target Ranges:  Prepandial:   less than 140 mg/dL      Peak postprandial:   less than 180 mg/dL (1-2 hours)      Critically ill patients:  140 - 180 mg/dL   Results for MARBIN, OLSHEFSKI (MRN [id number]) as of 01/24/2020 13:35  Ref. Range 01/24/2020 03:03 01/24/2020 06:02 01/24/2020 07:37 01/24/2020 11:46  Glucose-Capillary Latest Ref Range: 70 - 99 mg/dL 181 (H) 163 (H) 166 (H) 182 (H)  Results for PHUC, KLUTTZ (MRN [id number]) as of 01/24/2020 13:35  Ref. Range 01/23/2020 14:35 01/23/2020 15:34 01/23/2020 16:59 01/23/2020 18:06 01/23/2020 19:41 01/23/2020 21:12 01/23/2020 23:01 01/23/2020 23:47  Glucose-Capillary Latest Ref Range: 70 - 99 mg/dL 231 (H) 224 (H) 200 (H) 154 (H) 152 (H) 140 (H) 148 (H) 144 (H)  Results for JEN, EPPINGER (MRN [id number]) as of 01/24/2020 13:35  Ref. Range 01/23/2020 09:33 01/23/2020 15:53  Beta-Hydroxybutyric Acid Latest Ref Range: 0.05 - 0.27 mmol/L  4.49 (H)  Glucose Latest Ref Range: 70 - 99 mg/dL 329 (H)   Hemoglobin A1C Latest Ref Range: 4.8 - 5.6 %  10.1 (H)    Review of Glycemic Control  Diabetes history: DM2 Outpatient Diabetes medications: Farxiga 10 mg daily, Glipizide 10 mg QAM, Trulicity 1.5 mg Qweek, Metformin XR 1000 mg BID (not taen Metformin in several months) Current orders for Inpatient glycemic control: Lantus 15 units Q24H, Novolog 0-9 units TID with meals, Novolog 0-5 units QHS, Novolog 3 units TID with meals  Inpatient Diabetes Program Recommendations:     HbgA1C:  A1C 10.1% on 01/23/20 indicating an average glucose of 243 mg/dl over the past 2-3 months.  NOTE: Spoke with patient about diabetes and home regimen for diabetes control. Patient reports being followed by PCP for diabetes management and currently taking Farxiga 10 mg daily, Glipizide 10 mg QAM, and Trulicity 1.5 mg Qweek as an outpatient for diabetes control.  Patient reports that he is also  prescribed Metformin but he stopped taking it months ago to see if it would help decrease chronic nausea. Patient reports that his PCP told him to restart on the Metformin at his last appointment a couple weeks ago but he has not restarted it yet.  Patient reports that he has been having chronic nausea and daily vomiting for about 2 years. Patient states that he has lost over 100 pounds in the past year and states he vomits almost every day.  Discussed how Trulicity works and discussed GI side effects. Encouraged patient to talk with his PCP about whether he should be taking Trulicity since it delays stomach emptying and could be contributing to nausea.  Patient reports taking DM medications as prescribed usually but notes that over the past several days he has not been able to keep oral medications down. Patient states he only eats once a day and drinks an dietary shake in the morning when he takes medications.  Patient reports checking glucose 2-3 times per day and that it is usually 200-300's mg/dl and notes it is always 250 mg/dl or higher in the mornings.   Inquired about prior A1C and patient reports his last A1C was 9.6% (7.6% prior to that). Discussed A1C results (10.1% on 01/23/20 ) and explained that current A1C indicates an average glucose of 243 mg/dl over the past 2-3 months. Discussed glucose and A1C goals. Discussed importance of checking CBGs and maintaining good CBG control to prevent  long-term and short-term complications. Explained how hyperglycemia leads to damage within blood vessels which lead to the common complications seen with uncontrolled diabetes. Stressed to the patient the importance of improving glycemic control to prevent further complications from uncontrolled diabetes. Discussed impact of nutrition, exercise, stress, sickness, and medications on diabetes control.  Inquired about any prior insulin use and patient states he has never taking insulin in the past. Encouraged patient to  talk with PCP about Trulicity to see if he should stop taking it for now given on going issue with nausea and vomiting. Also encouraged patient to ask PCP about referring him to an Endocrinologist for assistance with improving DM control.  Patient verbalized understanding of information discussed and reports no further questions at this time related to diabetes.  Thanks, Raymond Alderman, RN, MSN, CDE Diabetes Coordinator Inpatient Diabetes Program [phone] (Team Pager)

## 2020-01-24 NOTE — ED Notes (Signed)
Pt given breakfast tray

## 2020-01-24 NOTE — Progress Notes (Signed)
Handoff received from Cameron. Patient sleeping.

## 2020-01-25 LAB — CBC WITH DIFFERENTIAL/PLATELET
Abs Immature Granulocytes: 0.03 10*3/uL (ref 0.00–0.07)
Basophils Absolute: 0 10*3/uL (ref 0.0–0.1)
Basophils Relative: 1 %
Eosinophils Absolute: 0.2 10*3/uL (ref 0.0–0.5)
Eosinophils Relative: 3 %
HCT: 39.2 % (ref 39.0–52.0)
Hemoglobin: 13.2 g/dL (ref 13.0–17.0)
Immature Granulocytes: 0 %
Lymphocytes Relative: 39 %
Lymphs Abs: 2.7 10*3/uL (ref 0.7–4.0)
MCH: 29.3 pg (ref 26.0–34.0)
MCHC: 33.7 g/dL (ref 30.0–36.0)
MCV: 86.9 fL (ref 80.0–100.0)
Monocytes Absolute: 0.5 10*3/uL (ref 0.1–1.0)
Monocytes Relative: 7 %
Neutro Abs: 3.4 10*3/uL (ref 1.7–7.7)
Neutrophils Relative %: 50 %
Platelets: 187 10*3/uL (ref 150–400)
RBC: 4.51 MIL/uL (ref 4.22–5.81)
RDW: 15.2 % (ref 11.5–15.5)
WBC: 6.9 10*3/uL (ref 4.0–10.5)
nRBC: 0 % (ref 0.0–0.2)

## 2020-01-25 LAB — PHOSPHORUS: Phosphorus: 1.9 mg/dL — ABNORMAL LOW (ref 2.5–4.6)

## 2020-01-25 LAB — MAGNESIUM: Magnesium: 2 mg/dL (ref 1.7–2.4)

## 2020-01-25 LAB — BASIC METABOLIC PANEL
Anion gap: 12 (ref 5–15)
BUN: 11 mg/dL (ref 6–20)
CO2: 19 mmol/L — ABNORMAL LOW (ref 22–32)
Calcium: 8.4 mg/dL — ABNORMAL LOW (ref 8.9–10.3)
Chloride: 107 mmol/L (ref 98–111)
Creatinine, Ser: 0.79 mg/dL (ref 0.61–1.24)
GFR calc Af Amer: >60 mL/min (ref >60)
GFR calc non Af Amer: >60 mL/min (ref >60)
Glucose, Bld: 176 mg/dL — ABNORMAL HIGH (ref 70–99)
Potassium: 3.5 mmol/L (ref 3.5–5.1)
Sodium: 138 mmol/L (ref 135–145)

## 2020-01-25 LAB — GLUCOSE, CAPILLARY: Glucose-Capillary: 176 mg/dL — ABNORMAL HIGH (ref 70–99)

## 2020-01-25 MED ORDER — PHOSPHORUS W/SOD & POTASSIUM 280-160-250 MG PO PACK: 1.0000 | PACK | Freq: Three times a day (TID) | ORAL | 0 refills | Status: AC

## 2020-01-25 NOTE — Progress Notes (Signed)
blank

## 2020-01-25 NOTE — Discharge Summary (Signed)
Physician Discharge Summary  Raymond Rose UUV:[id number] DOB: Feb 25, 1976 DOA: 01/23/2020  PCP: Birdie Sons, MD  Admit date: 01/23/2020 Discharge date: 01/25/2020  Admitted From: Home Disposition: Home  Recommendations for Outpatient Follow-up:  1. Follow up with PCP in 1-2 weeks 2. Please obtain BMP/CBC in one week   Discharge Condition: Stable CODE STATUS: Full code Diet recommendation: Low-carb diet  Discharge summary: Patient with history of insulin-dependent type 2 diabetes, obstructive sleep apnea on CPAP, depression anxiety chronic pain syndrome and obesity presented to the emergency room with 2 days of nausea vomiting epigastric pain.  On presentation he was found to be in DKA with anion gap of 25 and pH of 7.07.  He was admitted to the hospital, treated with IV fluids and insulin infusion with improvement.  Currently clinically improved and going home today.  Plan of care Patient is on Trulicity 1.5 mg/week, glipizide 10 mg in the morning and recently stopped taking Metformin because of epigastric discomfort.  Hemoglobin A1c is 10.  He is also working on lifestyle modification, diet and exercise.  We discussed about increasing or changing his regimen including addition of short acting insulin but patient did not want to change his regimen at this time.  He is adequately stabilized.  He is receiving short acting insulin in the hospital.  He is going home today, he will take his dose of Trulicity tonight and resume all his medications including restarting Metformin.  He will use increasing dose of Prilosec 2 times a day until epigastric discomfort improved. All electrolytes are normalized.  Phosphorus was low, he was given a short replacement therapy.  Discharge Diagnoses:  Principal Problem:   DKA (diabetic ketoacidosis) ([place]) Active Problems:   Hyperlipidemia, mixed   Obesity, Class III, BMI 40-49.9 (morbid obesity) ([place])   Obstructive sleep apnea   DKA (diabetic  ketoacidoses) ([place])   Depression with anxiety   Chronic pain    Discharge Instructions  Discharge Instructions    Diet Carb Modified   Complete by: As directed    Discharge instructions   Complete by: As directed    Can take ompeprazole 2 times a day for next 2 weeks Go back to metformin   Increase activity slowly   Complete by: As directed      Allergies as of 01/25/2020      Reactions   Bupropion    Extreme anxiety   Morphine       Medication List    TAKE these medications   ALPRAZolam 0.5 MG tablet Commonly known as: XANAX TAKE 1 TABLET (0.5 MG TOTAL) BY MOUTH EVERY 4 (FOUR) HOURS AS NEEDED. FOR ANXIETY   Farxiga 10 MG Tabs tablet Generic drug: dapagliflozin propanediol TAKE 1 TABLET BY MOUTH EVERY DAY What changed: how much to take   glipiZIDE 10 MG 24 hr tablet Commonly known as: GLUCOTROL XL Take 1 tablet (10 mg total) by mouth daily with breakfast.   ketoconazole 2 % cream Commonly known as: NIZORAL APPLY TO AFFECTED AREA ONCE DAILY FOR 7 DAYS AS NEEDED What changed: See the new instructions.   lamoTRIgine 150 MG tablet Commonly known as: LaMICtal Take 1 tablet (150 mg total) by mouth daily.   metFORMIN 500 MG 24 hr tablet Commonly known as: GLUCOPHAGE-XR Take 2 tablets (1,000 mg total) by mouth 2 (two) times daily.   OLANZapine 5 MG tablet Commonly known as: ZYPREXA Take by mouth.   omeprazole 40 MG capsule Commonly known as: PRILOSEC TAKE 1 CAPSULE (40  MG TOTAL) BY MOUTH DAILY.   ondansetron 4 MG tablet Commonly known as: Zofran Take 1 tablet (4 mg total) by mouth daily as needed for nausea or vomiting.   oxyCODONE-acetaminophen 7.5-325 MG tablet Commonly known as: PERCOCET Take 1 tablet by mouth every 4 (four) hours as needed.   Phosphorus w/Sod & Potassium 280-160-250 MG Pack Take 1 Package by mouth 3 (three) times daily after meals for 14 days.   promethazine 25 MG tablet Commonly known as: PHENERGAN TAKE 1 TABLET (25 MG TOTAL) BY  MOUTH EVERY 6 (SIX) HOURS AS NEEDED FOR NAUSEA OR VOMITING.   rosuvastatin 20 MG tablet Commonly known as: CRESTOR TAKE 1 TABLET BY MOUTH EVERY DAY   Trulicity 1.5 NF/6.2ZH Sopn Generic drug: Dulaglutide INJECT 1.5 MG INTO THE SKIN ONCE A WEEK.   Vilazodone HCl 40 MG Tabs Commonly known as: VIIBRYD Take 1 tablet (40 mg total) by mouth daily.       Follow-up Information    Fisher, Kirstie Peri, MD Follow up.   Specialty: Family Medicine Contact information: [address] [place] [address] [phone]              Allergies  Allergen Reactions  . Bupropion     Extreme anxiety  . Morphine       Procedures/Studies:  No results found. (Echo, Carotid, EGD, Colonoscopy, ERCP)    Subjective: Seen and examined.  Still has some epigastric discomfort, however denies any nausea vomiting.  He was able to finish all his meal.  Eager to go home.   Discharge Exam: Vitals:   01/25/20 0501 01/25/20 0744  BP: 115/66 105/72  Pulse: 77 71  Resp: 16 16  Temp: (!) 97.4 F (36.3 C) (!) 97.4 F (36.3 C)  SpO2: 98% 100%   Vitals:   01/24/20 1928 01/25/20 0030 01/25/20 0501 01/25/20 0744  BP: (!) 117/52 (!) 92/57 115/66 105/72  Pulse: 73 79 77 71  Resp: 17 14 16 16   Temp: 98.3 F (36.8 C) 98.6 F (37 C) (!) 97.4 F (36.3 C) (!) 97.4 F (36.3 C)  TempSrc: Oral Oral Oral Oral  SpO2: 98% 99% 98% 100%  Weight:      Height:        General: Pt is alert, awake, not in acute distress Cardiovascular: RRR, S1/S2 +, no rubs, no gallops Respiratory: CTA bilaterally, no wheezing, no rhonchi Abdominal: Soft, NT, ND, bowel sounds + Extremities: no edema, no cyanosis    The results of significant diagnostics from this hospitalization (including imaging, microbiology, ancillary and laboratory) are listed below for reference.     Microbiology: Recent Results (from the past 240 hour(s))  SARS Coronavirus 2 by RT PCR (hospital order, performed [facility]  hospital lab) Nasopharyngeal Nasopharyngeal Swab     Status: None   Collection Time: 01/23/20  3:53 PM   Specimen: Nasopharyngeal Swab  Result Value Ref Range Status   SARS Coronavirus 2 NEGATIVE NEGATIVE Final    Comment: (NOTE) SARS-CoV-2 target nucleic acids are NOT DETECTED.  The SARS-CoV-2 RNA is generally detectable in upper and lower respiratory specimens during the acute phase of infection. The lowest concentration of SARS-CoV-2 viral copies this assay can detect is 250 copies / mL. A negative result does not preclude SARS-CoV-2 infection and should not be used as the sole basis for treatment or other patient management decisions.  A negative result may occur with improper specimen collection / handling, submission of specimen other than nasopharyngeal swab, presence  of viral mutation(s) within the areas targeted by this assay, and inadequate number of viral copies (<250 copies / mL). A negative result must be combined with clinical observations, patient history, and epidemiological information.  Fact Sheet for Patients:   StrictlyIdeas.no  Fact Sheet for Healthcare Providers: BankingDealers.co.za  This test is not yet approved or  cleared by the [place] FDA and has been authorized for detection and/or diagnosis of SARS-CoV-2 by FDA under an Emergency Use Authorization (EUA).  This EUA will remain in effect (meaning this test can be used) for the duration of the COVID-19 declaration under Section 564(b)(1) of the Act, 21 U.S.C. section 360bbb-3(b)(1), unless the authorization is terminated or revoked sooner.  Performed at [facility], [address] [place], [place] [address]      Labs: BNP (last 3 results) No results for input(s): BNP in the last 8760 hours. Basic Metabolic Panel: Recent Labs  Lab 01/23/20 1938 01/24/20 0607 01/24/20 1130 01/24/20 1704 01/25/20 0413  NA 138 137 135 138 138  K  3.8 3.7 3.4* 3.6 3.5  CL 112* 111 111 110 107  CO2 15* 12* 14* 16* 19*  GLUCOSE 151* 178* 238* 198* 176*  BUN 12 11 11 12 11   CREATININE 0.80 0.85 0.82 0.91 0.79  CALCIUM 8.0* 8.3* 7.8* 8.3* 8.4*  MG  --   --  1.8  --  2.0  PHOS  --   --  1.8*  --  1.9*   Liver Function Tests: Recent Labs  Lab 01/23/20 0933  AST 20  ALT 21  ALKPHOS 106  BILITOT 2.5*  PROT 9.0*  ALBUMIN 4.9   Recent Labs  Lab 01/23/20 0933  LIPASE 31   No results for input(s): AMMONIA in the last 168 hours. CBC: Recent Labs  Lab 01/23/20 0933 01/25/20 0413  WBC 14.3* 6.9  NEUTROABS  --  3.4  HGB 17.0 13.2  HCT 52.6* 39.2  MCV 90.2 86.9  PLT 396 187   Cardiac Enzymes: No results for input(s): CKTOTAL, CKMB, CKMBINDEX, TROPONINI in the last 168 hours. BNP: Invalid input(s): POCBNP CBG: Recent Labs  Lab 01/24/20 0737 01/24/20 1146 01/24/20 1703 01/24/20 2125 01/25/20 0743  GLUCAP 166* 182* 190* 198* 176*   D-Dimer No results for input(s): DDIMER in the last 72 hours. Hgb A1c Recent Labs    01/23/20 1553  HGBA1C 10.1*   Lipid Profile No results for input(s): CHOL, HDL, LDLCALC, TRIG, CHOLHDL, LDLDIRECT in the last 72 hours. Thyroid function studies No results for input(s): TSH, T4TOTAL, T3FREE, THYROIDAB in the last 72 hours.  Invalid input(s): FREET3 Anemia work up No results for input(s): VITAMINB12, FOLATE, FERRITIN, TIBC, IRON, RETICCTPCT in the last 72 hours. Urinalysis    Component Value Date/Time   COLORURINE YELLOW (A) 01/23/2020 1553   APPEARANCEUR CLEAR (A) 01/23/2020 1553   LABSPEC 1.031 (H) 01/23/2020 1553   PHURINE 5.0 01/23/2020 1553   GLUCOSEU >=500 (A) 01/23/2020 1553   HGBUR NEGATIVE 01/23/2020 1553   BILIRUBINUR NEGATIVE 01/23/2020 1553   BILIRUBINUR Neg 06/22/2015 1136   KETONESUR 80 (A) 01/23/2020 1553   PROTEINUR 30 (A) 01/23/2020 1553   UROBILINOGEN 0.2 06/22/2015 1136   NITRITE NEGATIVE 01/23/2020 1553   LEUKOCYTESUR NEGATIVE 01/23/2020 1553    Sepsis Labs Invalid input(s): PROCALCITONIN,  WBC,  LACTICIDVEN Microbiology Recent Results (from the past 240 hour(s))  SARS Coronavirus 2 by RT PCR (hospital order, performed in [facility] hospital lab) Nasopharyngeal Nasopharyngeal Swab     Status: None  Collection Time: 01/23/20  3:53 PM   Specimen: Nasopharyngeal Swab  Result Value Ref Range Status   SARS Coronavirus 2 NEGATIVE NEGATIVE Final    Comment: (NOTE) SARS-CoV-2 target nucleic acids are NOT DETECTED.  The SARS-CoV-2 RNA is generally detectable in upper and lower respiratory specimens during the acute phase of infection. The lowest concentration of SARS-CoV-2 viral copies this assay can detect is 250 copies / mL. A negative result does not preclude SARS-CoV-2 infection and should not be used as the sole basis for treatment or other patient management decisions.  A negative result may occur with improper specimen collection / handling, submission of specimen other than nasopharyngeal swab, presence of viral mutation(s) within the areas targeted by this assay, and inadequate number of viral copies (<250 copies / mL). A negative result must be combined with clinical observations, patient history, and epidemiological information.  Fact Sheet for Patients:   StrictlyIdeas.no  Fact Sheet for Healthcare Providers: BankingDealers.co.za  This test is not yet approved or  cleared by the [place] FDA and has been authorized for detection and/or diagnosis of SARS-CoV-2 by FDA under an Emergency Use Authorization (EUA).  This EUA will remain in effect (meaning this test can be used) for the duration of the COVID-19 declaration under Section 564(b)(1) of the Act, 21 U.S.C. section 360bbb-3(b)(1), unless the authorization is terminated or revoked sooner.  Performed at [facility], [address] [place], [place] [address]      Time coordinating discharge:   40 minutes  SIGNED:   Barb Merino, MD  [facility] 01/25/2020, 9:16 AM

## 2020-01-26 ENCOUNTER — Telehealth: Payer: Self-pay

## 2020-01-26 NOTE — Telephone Encounter (Signed)
Transition Care Management Follow-up Telephone Call  Date of discharge and from where: [facility] on 01/25/20  How have you been since you were released from the hospital? Feeling a lot better. Pt has not vomited since d/c but still has nausea and abdominal pains. Pt was advised these syptoms would occur for a while longer. Pt is sore and weak but feels like he's on the mend. Appetite is good. BS readings have been around 150 which is lower than normal (below 200). Declines fever, SOB or diarrhea.   Any questions or concerns? Pt was told to get a referral from PCP to see an endocrinlogist to get an opinion on starting insulin.  Items Reviewed:  Did the pt receive and understand the discharge instructions provided? Yes   Medications obtained and verified? No, pt declined reviewing meds at this time. Pt did verify new medication prescribed (Phosphorus with Sodium & Potassium).  Any new allergies since your discharge? No   Dietary orders reviewed? Yes  Do you have support at home? Yes   Other (ie: DME, Home Health, etc): N/A  Functional Questionnaire: (I = Independent and D = Dependent)  Bathing/Dressing- I   Meal Prep- I  Eating- I  Maintaining continence- I  Transferring/Ambulation- I  Managing Meds- I   Follow up appointments reviewed:    PCP Hospital f/u appt confirmed? Yes  scheduled to see Carles Collet on 02/01/20 @ 2:00 PM.  [facility] f/u appt confirmed? N/A   Are transportation arrangements needed? No   If their condition worsens, is the pt aware to call  their PCP or go to the ED? Yes  Was the patient provided with contact information for the PCP's office or ED? Yes  Was the pt encouraged to call back with questions or concerns? Yes

## 2020-01-26 NOTE — Telephone Encounter (Signed)
HFU scheduled 02/01/20 @ 2:00 PM with Carles Collet.

## 2020-01-27 ENCOUNTER — Encounter: Payer: Self-pay | Admitting: Family Medicine

## 2020-01-28 ENCOUNTER — Telehealth (INDEPENDENT_AMBULATORY_CARE_PROVIDER_SITE_OTHER): Payer: 59 | Admitting: Physician Assistant

## 2020-01-28 ENCOUNTER — Ambulatory Visit: Payer: Self-pay

## 2020-01-28 ENCOUNTER — Encounter: Payer: Self-pay | Admitting: Physician Assistant

## 2020-01-28 ENCOUNTER — Other Ambulatory Visit: Payer: Self-pay

## 2020-01-28 DIAGNOSIS — R1013 Epigastric pain: Secondary | ICD-10-CM | POA: Diagnosis present

## 2020-01-28 DIAGNOSIS — Z885 Allergy status to narcotic agent status: Secondary | ICD-10-CM

## 2020-01-28 DIAGNOSIS — Z87891 Personal history of nicotine dependence: Secondary | ICD-10-CM

## 2020-01-28 DIAGNOSIS — F329 Major depressive disorder, single episode, unspecified: Secondary | ICD-10-CM | POA: Diagnosis present

## 2020-01-28 DIAGNOSIS — R531 Weakness: Secondary | ICD-10-CM | POA: Diagnosis not present

## 2020-01-28 DIAGNOSIS — Z7289 Other problems related to lifestyle: Secondary | ICD-10-CM

## 2020-01-28 DIAGNOSIS — Z9049 Acquired absence of other specified parts of digestive tract: Secondary | ICD-10-CM

## 2020-01-28 DIAGNOSIS — F418 Other specified anxiety disorders: Secondary | ICD-10-CM | POA: Diagnosis present

## 2020-01-28 DIAGNOSIS — E876 Hypokalemia: Secondary | ICD-10-CM | POA: Diagnosis present

## 2020-01-28 DIAGNOSIS — G8929 Other chronic pain: Secondary | ICD-10-CM | POA: Diagnosis present

## 2020-01-28 DIAGNOSIS — E1110 Type 2 diabetes mellitus with ketoacidosis without coma: Secondary | ICD-10-CM

## 2020-01-28 DIAGNOSIS — E782 Mixed hyperlipidemia: Secondary | ICD-10-CM | POA: Diagnosis present

## 2020-01-28 DIAGNOSIS — Z20822 Contact with and (suspected) exposure to covid-19: Secondary | ICD-10-CM | POA: Diagnosis present

## 2020-01-28 DIAGNOSIS — H9190 Unspecified hearing loss, unspecified ear: Secondary | ICD-10-CM | POA: Diagnosis present

## 2020-01-28 DIAGNOSIS — Z79899 Other long term (current) drug therapy: Secondary | ICD-10-CM

## 2020-01-28 DIAGNOSIS — G4733 Obstructive sleep apnea (adult) (pediatric): Secondary | ICD-10-CM | POA: Diagnosis present

## 2020-01-28 DIAGNOSIS — Z7984 Long term (current) use of oral hypoglycemic drugs: Secondary | ICD-10-CM

## 2020-01-28 DIAGNOSIS — Z888 Allergy status to other drugs, medicaments and biological substances status: Secondary | ICD-10-CM

## 2020-01-28 DIAGNOSIS — K219 Gastro-esophageal reflux disease without esophagitis: Secondary | ICD-10-CM | POA: Diagnosis present

## 2020-01-28 DIAGNOSIS — Z818 Family history of other mental and behavioral disorders: Secondary | ICD-10-CM

## 2020-01-28 DIAGNOSIS — Z8616 Personal history of COVID-19: Secondary | ICD-10-CM

## 2020-01-28 NOTE — Progress Notes (Signed)
MyChart Video Visit    Virtual Visit via Video Note   This visit type was conducted due to national recommendations for restrictions regarding the COVID-19 Pandemic (e.g. social distancing) in an effort to limit this patient's exposure and mitigate transmission in our community. This patient is at least at moderate risk for complications without adequate follow up. This format is felt to be most appropriate for this patient at this time. Physical exam was limited by quality of the video and audio technology used for the visit.   I connected with Raymond Rose on 01/28/20 at  3:40 PM EDT by a video enabled telemedicine application and verified that I am speaking with the correct person using two identifiers.  I discussed the limitations of evaluation and management by telemedicine and the availability of in person appointments. The patient expressed understanding and agreed to proceed.  Patient location: Home Provider location: [facility]   Patient: Raymond Rose   DOB: Sep 10, 1975   44 y.o. Male  MRN: [id number] Visit Date: 01/28/2020  Today's healthcare provider: Mar Daring, PA-C   Chief Complaint  Patient presents with  . Fatigue  . Generalized Body Aches   Subjective    HPI  Patient C/O body aches and weakness that started yesterday. He was recently admitted to the hospital on 01/23/20 -01/25/20 for DKA. He reports he had been feeling well until last night. He started noticing increased fatigue, weakness. Not wanting to get up. Reports sugars have been ok. Has mild nausea, not vomiting at this time. Still urinating normally. No fevers.   Patient Active Problem List   Diagnosis Date Noted  . DKA, type 2 ([place]) 01/29/2020  . DKA (diabetic ketoacidosis) ([place]) 01/23/2020  . Chronic pain 01/23/2020  . At risk for prolonged QT interval syndrome 12/23/2019  . MDD (major depressive disorder), recurrent, in partial remission ([place]) 12/23/2019  . Microalbuminuria 08/23/2019  .  Personality disorder, unspecified ([place]) 07/12/2019  . Bereavement 07/12/2019  . DKA (diabetic ketoacidoses) ([place]) 07/03/2019  . Depression with anxiety 07/03/2019  . COVID-19 virus infection 07/03/2019  . Nausea vomiting and diarrhea 07/03/2019  . MDD (major depressive disorder), recurrent episode, moderate ([place]) 04/21/2019  . GAD (generalized anxiety disorder) 04/21/2019  . Social anxiety disorder 04/21/2019  . Erectile dysfunction 10/03/2017  . Anxiety 08/08/2017  . Elevated transaminase level 08/28/2016  . Rectus diastasis 02/14/2016  . 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 Panic attacks 06/22/2015  . Depression 06/22/2015  . Diabetes mellitus ([place]) 01/03/2015  . Eating disorder 01/03/2015  . GERD (gastroesophageal reflux disease) 01/03/2015  . Hearing loss 01/03/2015  . Leg cramps 01/03/2015  . Low back pain 01/03/2015  . Adrenal mass, right ([place]) 06/18/2012  . Obstructive sleep apnea 07/08/2010  . Cluster headache 12/01/2008  . Fam hx-ischem heart disease 10/03/2008  . Obesity, Class III, BMI 40-49.9 (morbid obesity) ([place]) 09/30/2008  . Hyperlipidemia, mixed 12/12/2004   Past Medical History:  Diagnosis Date  . Anxiety   . Arthritis   . COVID-19 06/2019  . Cyclical vomiting   . Depression   . Diabetes mellitus, type II ([place])   . OSA on CPAP   . Vertigo 01/03/2015      Medications: Outpatient Medications Prior to Visit  Medication Sig  . ALPRAZolam (XANAX) 0.5 MG tablet TAKE 1 TABLET (0.5 MG TOTAL) BY MOUTH EVERY 4 (FOUR) HOURS AS NEEDED. FOR ANXIETY  . FARXIGA 10 MG TABS tablet TAKE 1 TABLET BY MOUTH EVERY DAY (Patient taking differently: Take 10  mg by mouth daily. )  . glipiZIDE (GLUCOTROL XL) 10 MG 24 hr tablet Take 1 tablet (10 mg total) by mouth daily with breakfast.  . ketoconazole (NIZORAL) 2 % cream APPLY TO AFFECTED AREA ONCE DAILY FOR 7 DAYS AS NEEDED (Patient taking differently: Apply 1 application topically daily. APPLY TO AFFECTED AREA ONCE DAILY  FOR 7 DAYS AS NEEDED)  . lamoTRIgine (LAMICTAL) 150 MG tablet Take 1 tablet (150 mg total) by mouth daily.  . metFORMIN (GLUCOPHAGE-XR) 500 MG 24 hr tablet Take 2 tablets (1,000 mg total) by mouth 2 (two) times daily.  Marland Kitchen OLANZapine (ZYPREXA) 5 MG tablet Take by mouth.  Marland Kitchen omeprazole (PRILOSEC) 40 MG capsule TAKE 1 CAPSULE (40 MG TOTAL) BY MOUTH DAILY.  Marland Kitchen ondansetron (ZOFRAN) 4 MG tablet Take 1 tablet (4 mg total) by mouth daily as needed for nausea or vomiting.  Marland Kitchen oxyCODONE-acetaminophen (PERCOCET) 7.5-325 MG tablet Take 1 tablet by mouth every 4 (four) hours as needed.  . Potassium & Sodium Phosphates (PHOSPHORUS W/SOD & POTASSIUM) 280-160-250 MG PACK Take 1 Package by mouth 3 (three) times daily after meals for 14 days.  . promethazine (PHENERGAN) 25 MG tablet TAKE 1 TABLET (25 MG TOTAL) BY MOUTH EVERY 6 (SIX) HOURS AS NEEDED FOR NAUSEA OR VOMITING.  . rosuvastatin (CRESTOR) 20 MG tablet TAKE 1 TABLET BY MOUTH EVERY DAY (Patient taking differently: Take 20 mg by mouth daily. )  . TRULICITY 1.5 FX/9.0WI SOPN INJECT 1.5 MG INTO THE SKIN ONCE A WEEK.  . Vilazodone HCl (VIIBRYD) 40 MG TABS Take 1 tablet (40 mg total) by mouth daily.   No facility-administered medications prior to visit.    Review of Systems  Last CBC Lab Results  Component Value Date   WBC 4.9 02/06/2020   HGB 13.6 02/06/2020   HCT 40.5 02/06/2020   MCV 88.0 02/06/2020   MCH 29.6 02/06/2020   RDW 14.3 02/06/2020   PLT 185 09/73/5329   Last metabolic panel Lab Results  Component Value Date   GLUCOSE 300 (H) 02/06/2020   NA 137 02/06/2020   K 3.8 02/06/2020   CL 95 (L) 02/06/2020   CO2 26 02/06/2020   BUN 17 02/06/2020   CREATININE 0.79 02/06/2020   GFRNONAA >60 02/06/2020   GFRAA >60 02/06/2020   CALCIUM 9.1 02/06/2020   PHOS 1.9 (L) 01/25/2020   PROT 7.1 02/06/2020   ALBUMIN 4.1 02/06/2020   LABGLOB 2.9 05/24/2019   AGRATIO 1.6 05/24/2019   BILITOT 1.4 (H) 02/06/2020   ALKPHOS 67 02/06/2020   AST 23  02/06/2020   ALT 25 02/06/2020   ANIONGAP 16 (H) 02/06/2020      Objective    There were no vitals taken for this visit. BP Readings from Last 3 Encounters:  02/06/20 129/88  01/31/20 (!) 146/98  01/25/20 105/72   Wt Readings from Last 3 Encounters:  02/06/20 300 lb (136.1 kg)  01/28/20 300 lb (136.1 kg)  01/23/20 (!) 320 lb (145.2 kg)      Physical Exam     Assessment & Plan     1. Weakness Advised to continue to push fluids and monitor symptoms closely. If he continues to decline he will need to return to the ER for emergent evaluation to r/o recurrent DKA and have kidney function evaluated. He and his wife agree. Needs close monitoring. Hopefully he can improve with hydration and monitoring of glucose. Call if worsening.  2. Diabetic ketoacidosis without coma associated with type 2 diabetes mellitus ([place]) Had  improved after hospitalization. Closely monitor. If worsening will need to go to ER.    No follow-ups on file.     I discussed the assessment and treatment plan with the patient. The patient was provided an opportunity to ask questions and all were answered. The patient agreed with the plan and demonstrated an understanding of the instructions.   The patient was advised to call back or seek an in-person evaluation if the symptoms worsen or if the condition fails to improve as anticipated.  I provided 23 minutes of non-face-to-face time during this encounter.  Reynolds Bowl, PA-C, have reviewed all documentation for this visit. The documentation on 02/08/20 for the exam, diagnosis, procedures, and orders are all accurate and complete.  [facility] [place] [phone] (phone) [phone] (fax)  [place]

## 2020-01-28 NOTE — ED Triage Notes (Signed)
Patient states that he was discharged from the hospital on Tuesday for DKA. Patient states that on Thursday he started feeling weak and short of breath. Patient states that he started vomiting today.

## 2020-01-28 NOTE — Telephone Encounter (Signed)
Pt.'s wife reports pt. Has developed body aches and weakness today. He was recently hospitalized with his diabetes. Blood sugars have been 125-190. Pt. Is at home sleeping and not answering phone per wife. Spoke with [name] in the practice and virtual visit made for today. Instructed wife if symptoms worsen go to ED.  JD 2  Raymond Rose, 44 y.o., 02/08/1976 MRN:  [id number] Phone:  [phone] Jerilynn Mages) PCP:  Birdie Sons, MD Coverage:  Cigna/Generic Cigna Next Appt With Family Medicine 02/01/2020 at 2:00 PM Message from Scherrie Gerlach sent at 01/28/2020 10:14 AM EDT  Wife calling to schedule appt for pt. Pt dc'd from hospital 8/10. Was feeling better. However this am he is experiencing extreme fatigue, and muscle pain. So much can barely get to bathroom. These are different sx than what he was hospitalized for. She states she is at work and pt is in bed. She is on the [facility]. She wanted to make him in person appt this afternoon.  Unable to reach NT after several attempts. She states she is not sure he can answer the phone, he may not have in bed with him.  She was going to go home at lunch and try and bring him this afternoon. You can call her or try [name] at [phone]  Pt has appt next Tues for hosp fu. But they are really concerned at new sx.   Call History   Type Contact Phone  01/28/2020 09:50 AM EDT Phone (Incoming) [name],[name] (Emergency Contact) [phone]  User: Scherrie Gerlach    Reason for Disposition . [1] MODERATE pain (e.g., interferes with normal activities) AND [2] present > 3 days  Answer Assessment - Initial Assessment Questions 1. ONSET: "When did the muscle aches or body pains start?"      This morning 2. LOCATION: "What part of your body is hurting?" (e.g., entire body, arms, legs)      All over 3. SEVERITY: "How bad is the pain?" (Scale 1-10; or mild, moderate, severe)   - MILD (1-3): doesn't interfere with normal activities    - MODERATE  (4-7): interferes with normal activities or awakens from sleep    - SEVERE (8-10):  excruciating pain, unable to do any normal activities      Moderate 4. CAUSE: "What do you think is causing the pains?"     Unsure 5. FEVER: "Have you been having fever?"     No 6. OTHER SYMPTOMS: "Do you have any other symptoms?" (e.g., chest pain, weakness, rash, cold or flu symptoms, weight loss)     Weakness 7. PREGNANCY: "Is there any chance you are pregnant?" "When was your last menstrual period?"     n/a 8. TRAVEL: "Have you traveled out of the country in the last month?" (e.g., travel history, exposures)     No  Protocols used: MUSCLE ACHES AND BODY PAIN-A-AH

## 2020-01-29 ENCOUNTER — Inpatient Hospital Stay
Admission: EM | Admit: 2020-01-29 | Discharge: 2020-01-31 | DRG: 639 | Disposition: A | Discharge status: Home / Self Care | Payer: 59 | Attending: Internal Medicine | Admitting: Internal Medicine

## 2020-01-29 ENCOUNTER — Encounter: Payer: Self-pay | Admitting: Emergency Medicine

## 2020-01-29 ENCOUNTER — Emergency Department: Payer: 59

## 2020-01-29 DIAGNOSIS — K219 Gastro-esophageal reflux disease without esophagitis: Secondary | ICD-10-CM | POA: Diagnosis present

## 2020-01-29 DIAGNOSIS — Z20822 Contact with and (suspected) exposure to covid-19: Secondary | ICD-10-CM | POA: Diagnosis present

## 2020-01-29 DIAGNOSIS — Z885 Allergy status to narcotic agent status: Secondary | ICD-10-CM | POA: Diagnosis not present

## 2020-01-29 DIAGNOSIS — Z7984 Long term (current) use of oral hypoglycemic drugs: Secondary | ICD-10-CM | POA: Diagnosis not present

## 2020-01-29 DIAGNOSIS — E1110 Type 2 diabetes mellitus with ketoacidosis without coma: Secondary | ICD-10-CM | POA: Diagnosis present

## 2020-01-29 DIAGNOSIS — Z79899 Other long term (current) drug therapy: Secondary | ICD-10-CM | POA: Diagnosis not present

## 2020-01-29 DIAGNOSIS — Z87891 Personal history of nicotine dependence: Secondary | ICD-10-CM | POA: Diagnosis not present

## 2020-01-29 DIAGNOSIS — E876 Hypokalemia: Secondary | ICD-10-CM | POA: Diagnosis present

## 2020-01-29 DIAGNOSIS — H9190 Unspecified hearing loss, unspecified ear: Secondary | ICD-10-CM | POA: Diagnosis present

## 2020-01-29 DIAGNOSIS — Z888 Allergy status to other drugs, medicaments and biological substances status: Secondary | ICD-10-CM | POA: Diagnosis not present

## 2020-01-29 DIAGNOSIS — F329 Major depressive disorder, single episode, unspecified: Secondary | ICD-10-CM | POA: Diagnosis present

## 2020-01-29 DIAGNOSIS — G8929 Other chronic pain: Secondary | ICD-10-CM | POA: Diagnosis present

## 2020-01-29 DIAGNOSIS — Z9049 Acquired absence of other specified parts of digestive tract: Secondary | ICD-10-CM | POA: Diagnosis not present

## 2020-01-29 DIAGNOSIS — Z8616 Personal history of COVID-19: Secondary | ICD-10-CM | POA: Diagnosis not present

## 2020-01-29 DIAGNOSIS — Z7289 Other problems related to lifestyle: Secondary | ICD-10-CM | POA: Diagnosis not present

## 2020-01-29 DIAGNOSIS — G4733 Obstructive sleep apnea (adult) (pediatric): Secondary | ICD-10-CM | POA: Diagnosis present

## 2020-01-29 DIAGNOSIS — R1013 Epigastric pain: Secondary | ICD-10-CM | POA: Diagnosis present

## 2020-01-29 DIAGNOSIS — E782 Mixed hyperlipidemia: Secondary | ICD-10-CM | POA: Diagnosis present

## 2020-01-29 DIAGNOSIS — F418 Other specified anxiety disorders: Secondary | ICD-10-CM | POA: Diagnosis present

## 2020-01-29 DIAGNOSIS — Z818 Family history of other mental and behavioral disorders: Secondary | ICD-10-CM | POA: Diagnosis not present

## 2020-01-29 LAB — GLUCOSE, CAPILLARY
Glucose-Capillary: 194 mg/dL — ABNORMAL HIGH (ref 70–99)
Glucose-Capillary: 153 mg/dL — ABNORMAL HIGH (ref 70–99)
Glucose-Capillary: 149 mg/dL — ABNORMAL HIGH (ref 70–99)
Glucose-Capillary: 165 mg/dL — ABNORMAL HIGH (ref 70–99)
Glucose-Capillary: 120 mg/dL — ABNORMAL HIGH (ref 70–99)
Glucose-Capillary: 150 mg/dL — ABNORMAL HIGH (ref 70–99)
Glucose-Capillary: 133 mg/dL — ABNORMAL HIGH (ref 70–99)
Glucose-Capillary: 123 mg/dL — ABNORMAL HIGH (ref 70–99)
Glucose-Capillary: 128 mg/dL — ABNORMAL HIGH (ref 70–99)
Glucose-Capillary: 135 mg/dL — ABNORMAL HIGH (ref 70–99)
Glucose-Capillary: 135 mg/dL — ABNORMAL HIGH (ref 70–99)
Glucose-Capillary: 132 mg/dL — ABNORMAL HIGH (ref 70–99)
Glucose-Capillary: 117 mg/dL — ABNORMAL HIGH (ref 70–99)
Glucose-Capillary: 151 mg/dL — ABNORMAL HIGH (ref 70–99)
Glucose-Capillary: 124 mg/dL — ABNORMAL HIGH (ref 70–99)

## 2020-01-29 LAB — URINALYSIS, COMPLETE (UACMP) WITH MICROSCOPIC
Bacteria, UA: NONE SEEN
Bilirubin Urine: NEGATIVE
Glucose, UA: >=500 mg/dL — AB
Ketones, ur: 80 mg/dL — AB
Leukocytes,Ua: NEGATIVE
Nitrite: NEGATIVE
Protein, ur: 100 mg/dL — AB
Specific Gravity, Urine: 1.024 (ref 1.005–1.030)
pH: 5 (ref 5.0–8.0)

## 2020-01-29 LAB — BASIC METABOLIC PANEL
Anion gap: 17 — ABNORMAL HIGH (ref 5–15)
Anion gap: 14 (ref 5–15)
Anion gap: 13 (ref 5–15)
Anion gap: 12 (ref 5–15)
Anion gap: 18 — ABNORMAL HIGH (ref 5–15)
BUN: 9 mg/dL (ref 6–20)
BUN: 7 mg/dL (ref 6–20)
BUN: 7 mg/dL (ref 6–20)
BUN: 6 mg/dL (ref 6–20)
BUN: 5 mg/dL — ABNORMAL LOW (ref 6–20)
CO2: 10 mmol/L — ABNORMAL LOW (ref 22–32)
CO2: 14 mmol/L — ABNORMAL LOW (ref 22–32)
CO2: 15 mmol/L — ABNORMAL LOW (ref 22–32)
CO2: 17 mmol/L — ABNORMAL LOW (ref 22–32)
CO2: 15 mmol/L — ABNORMAL LOW (ref 22–32)
Calcium: 8.5 mg/dL — ABNORMAL LOW (ref 8.9–10.3)
Calcium: 8.5 mg/dL — ABNORMAL LOW (ref 8.9–10.3)
Calcium: 8.5 mg/dL — ABNORMAL LOW (ref 8.9–10.3)
Calcium: 8.5 mg/dL — ABNORMAL LOW (ref 8.9–10.3)
Calcium: 8.8 mg/dL — ABNORMAL LOW (ref 8.9–10.3)
Chloride: 109 mmol/L (ref 98–111)
Chloride: 109 mmol/L (ref 98–111)
Chloride: 108 mmol/L (ref 98–111)
Chloride: 108 mmol/L (ref 98–111)
Chloride: 106 mmol/L (ref 98–111)
Creatinine, Ser: 0.89 mg/dL (ref 0.61–1.24)
Creatinine, Ser: 0.7 mg/dL (ref 0.61–1.24)
Creatinine, Ser: 0.91 mg/dL (ref 0.61–1.24)
Creatinine, Ser: 0.75 mg/dL (ref 0.61–1.24)
Creatinine, Ser: 0.73 mg/dL (ref 0.61–1.24)
GFR calc Af Amer: >60 mL/min (ref >60)
GFR calc Af Amer: >60 mL/min (ref >60)
GFR calc Af Amer: >60 mL/min (ref >60)
GFR calc Af Amer: >60 mL/min (ref >60)
GFR calc Af Amer: >60 mL/min (ref >60)
GFR calc non Af Amer: >60 mL/min (ref >60)
GFR calc non Af Amer: >60 mL/min (ref >60)
GFR calc non Af Amer: >60 mL/min (ref >60)
GFR calc non Af Amer: >60 mL/min (ref >60)
GFR calc non Af Amer: >60 mL/min (ref >60)
Glucose, Bld: 155 mg/dL — ABNORMAL HIGH (ref 70–99)
Glucose, Bld: 149 mg/dL — ABNORMAL HIGH (ref 70–99)
Glucose, Bld: 151 mg/dL — ABNORMAL HIGH (ref 70–99)
Glucose, Bld: 151 mg/dL — ABNORMAL HIGH (ref 70–99)
Glucose, Bld: 163 mg/dL — ABNORMAL HIGH (ref 70–99)
Potassium: 3.9 mmol/L (ref 3.5–5.1)
Potassium: 3.5 mmol/L (ref 3.5–5.1)
Potassium: 4.7 mmol/L (ref 3.5–5.1)
Potassium: 3.6 mmol/L (ref 3.5–5.1)
Potassium: 3.4 mmol/L — ABNORMAL LOW (ref 3.5–5.1)
Sodium: 136 mmol/L (ref 135–145)
Sodium: 137 mmol/L (ref 135–145)
Sodium: 136 mmol/L (ref 135–145)
Sodium: 137 mmol/L (ref 135–145)
Sodium: 139 mmol/L (ref 135–145)

## 2020-01-29 LAB — BLOOD GAS, VENOUS
Acid-base deficit: 20.4 mmol/L — ABNORMAL HIGH (ref 0.0–2.0)
Bicarbonate: 7.5 mmol/L — ABNORMAL LOW (ref 20.0–28.0)
O2 Saturation: 51.1 %
Patient temperature: 37
pCO2, Ven: 24 mmHg — ABNORMAL LOW (ref 44.0–60.0)
pH, Ven: 7.1 — CL (ref 7.250–7.430)
pO2, Ven: 39 mmHg (ref 32.0–45.0)

## 2020-01-29 LAB — COMPREHENSIVE METABOLIC PANEL
ALT: 18 U/L (ref 0–44)
AST: 18 U/L (ref 15–41)
Albumin: 5 g/dL (ref 3.5–5.0)
Alkaline Phosphatase: 102 U/L (ref 38–126)
Anion gap: 25 — ABNORMAL HIGH (ref 5–15)
BUN: 12 mg/dL (ref 6–20)
CO2: 9 mmol/L — ABNORMAL LOW (ref 22–32)
Calcium: 9.6 mg/dL (ref 8.9–10.3)
Chloride: 103 mmol/L (ref 98–111)
Creatinine, Ser: 1.2 mg/dL (ref 0.61–1.24)
GFR calc Af Amer: >60 mL/min (ref >60)
GFR calc non Af Amer: >60 mL/min (ref >60)
Glucose, Bld: 232 mg/dL — ABNORMAL HIGH (ref 70–99)
Potassium: 4.2 mmol/L (ref 3.5–5.1)
Sodium: 137 mmol/L (ref 135–145)
Total Bilirubin: 2.1 mg/dL — ABNORMAL HIGH (ref 0.3–1.2)
Total Protein: 9.3 g/dL — ABNORMAL HIGH (ref 6.5–8.1)

## 2020-01-29 LAB — TROPONIN I (HIGH SENSITIVITY)
Troponin I (High Sensitivity): 3 ng/L (ref <18)
Troponin I (High Sensitivity): 4 ng/L (ref <18)

## 2020-01-29 LAB — BETA-HYDROXYBUTYRIC ACID
Beta-Hydroxybutyric Acid: >8 mmol/L — ABNORMAL HIGH (ref 0.05–0.27)
Beta-Hydroxybutyric Acid: 5.14 mmol/L — ABNORMAL HIGH (ref 0.05–0.27)
Beta-Hydroxybutyric Acid: 4.74 mmol/L — ABNORMAL HIGH (ref 0.05–0.27)

## 2020-01-29 LAB — LIPASE, BLOOD: Lipase: 42 U/L (ref 11–51)

## 2020-01-29 LAB — CBC WITH DIFFERENTIAL/PLATELET
Abs Immature Granulocytes: 0.29 10*3/uL — ABNORMAL HIGH (ref 0.00–0.07)
Basophils Absolute: 0.1 10*3/uL (ref 0.0–0.1)
Basophils Relative: 1 %
Eosinophils Absolute: 0.1 10*3/uL (ref 0.0–0.5)
Eosinophils Relative: 1 %
HCT: 52.5 % — ABNORMAL HIGH (ref 39.0–52.0)
Hemoglobin: 17.9 g/dL — ABNORMAL HIGH (ref 13.0–17.0)
Immature Granulocytes: 2 %
Lymphocytes Relative: 14 %
Lymphs Abs: 2 10*3/uL (ref 0.7–4.0)
MCH: 29.5 pg (ref 26.0–34.0)
MCHC: 34.1 g/dL (ref 30.0–36.0)
MCV: 86.5 fL (ref 80.0–100.0)
Monocytes Absolute: 0.6 10*3/uL (ref 0.1–1.0)
Monocytes Relative: 4 %
Neutro Abs: 10.7 10*3/uL — ABNORMAL HIGH (ref 1.7–7.7)
Neutrophils Relative %: 78 %
Platelets: 321 10*3/uL (ref 150–400)
RBC: 6.07 MIL/uL — ABNORMAL HIGH (ref 4.22–5.81)
RDW: 15.1 % (ref 11.5–15.5)
WBC: 13.8 10*3/uL — ABNORMAL HIGH (ref 4.0–10.5)
nRBC: 0 % (ref 0.0–0.2)

## 2020-01-29 LAB — SARS CORONAVIRUS 2 BY RT PCR (HOSPITAL ORDER, PERFORMED IN ~~LOC~~ HOSPITAL LAB): SARS Coronavirus 2: NEGATIVE

## 2020-01-29 LAB — LACTIC ACID, PLASMA: Lactic Acid, Venous: 1.5 mmol/L (ref 0.5–1.9)

## 2020-01-29 MED ORDER — ENOXAPARIN SODIUM 40 MG/0.4ML ~~LOC~~ SOLN
40.0000 mg | Freq: Two times a day (BID) | SUBCUTANEOUS | Status: DC
  Administered 2020-01-29 – 2020-01-31 (×2): 40 mg via SUBCUTANEOUS
  Filled 2020-01-29 (×2): qty 0.4

## 2020-01-29 MED ORDER — MORPHINE SULFATE (PF) 2 MG/ML IV SOLN
2.0000 mg | INTRAVENOUS | Status: DC | PRN
  Administered 2020-01-29 – 2020-01-31 (×7): 2 mg via INTRAVENOUS
  Filled 2020-01-29 (×7): qty 1

## 2020-01-29 MED ORDER — INSULIN REGULAR(HUMAN) IN NACL 100-0.9 UT/100ML-% IV SOLN
INTRAVENOUS | Status: DC
  Administered 2020-01-29: 4 [IU]/h via INTRAVENOUS
  Filled 2020-01-29: qty 100

## 2020-01-29 MED ORDER — LACTATED RINGERS IV SOLN: INTRAVENOUS | Status: DC

## 2020-01-29 MED ORDER — ONDANSETRON HCL 4 MG/2ML IJ SOLN
4.0000 mg | Freq: Once | INTRAMUSCULAR | Status: AC
  Administered 2020-01-29: 4 mg via INTRAVENOUS
  Filled 2020-01-29: qty 2

## 2020-01-29 MED ORDER — POTASSIUM CHLORIDE 10 MEQ/100ML IV SOLN
INTRAVENOUS | Status: AC
  Administered 2020-01-29 (×2): 10 meq via INTRAVENOUS
  Filled 2020-01-29: qty 100

## 2020-01-29 MED ORDER — POTASSIUM CHLORIDE 10 MEQ/100ML IV SOLN: 10.0000 meq | INTRAVENOUS | Status: DC

## 2020-01-29 MED ORDER — SODIUM CHLORIDE 0.9 % IV SOLN: INTRAVENOUS | Status: DC

## 2020-01-29 MED ORDER — DEXTROSE IN LACTATED RINGERS 5 % IV SOLN: INTRAVENOUS | Status: DC

## 2020-01-29 MED ORDER — INSULIN REGULAR(HUMAN) IN NACL 100-0.9 UT/100ML-% IV SOLN
INTRAVENOUS | Status: DC
  Administered 2020-01-29: 7.5 [IU]/h via INTRAVENOUS
  Filled 2020-01-29: qty 100

## 2020-01-29 MED ORDER — PROMETHAZINE HCL 25 MG/ML IJ SOLN
12.5000 mg | Freq: Four times a day (QID) | INTRAMUSCULAR | Status: DC | PRN
  Administered 2020-01-29 – 2020-01-30 (×2): 12.5 mg via INTRAVENOUS
  Filled 2020-01-29 (×2): qty 1

## 2020-01-29 MED ORDER — DEXTROSE 50 % IV SOLN: 0.0000 mL | INTRAVENOUS | Status: DC | PRN

## 2020-01-29 MED ORDER — ONDANSETRON HCL 4 MG/2ML IJ SOLN
INTRAMUSCULAR | Status: AC
  Filled 2020-01-29: qty 2

## 2020-01-29 MED ORDER — POTASSIUM CHLORIDE 10 MEQ/100ML IV SOLN
10.0000 meq | INTRAVENOUS | Status: AC
  Administered 2020-01-29 (×2): 10 meq via INTRAVENOUS
  Filled 2020-01-29 (×2): qty 100

## 2020-01-29 MED ORDER — SODIUM CHLORIDE 0.9 % IV BOLUS
1000.0000 mL | Freq: Once | INTRAVENOUS | Status: AC
  Administered 2020-01-29: 1000 mL via INTRAVENOUS

## 2020-01-29 MED ORDER — PANTOPRAZOLE SODIUM 40 MG IV SOLR
40.0000 mg | INTRAVENOUS | Status: DC
  Administered 2020-01-29 – 2020-01-31 (×3): 40 mg via INTRAVENOUS
  Filled 2020-01-29 (×3): qty 40

## 2020-01-29 MED ORDER — FAMOTIDINE IN NACL 20-0.9 MG/50ML-% IV SOLN
20.0000 mg | Freq: Once | INTRAVENOUS | Status: AC
  Administered 2020-01-29: 20 mg via INTRAVENOUS
  Filled 2020-01-29: qty 50

## 2020-01-29 MED ORDER — ONDANSETRON HCL 4 MG/2ML IJ SOLN
4.0000 mg | Freq: Three times a day (TID) | INTRAMUSCULAR | Status: DC | PRN
  Administered 2020-01-29 (×2): 4 mg via INTRAVENOUS
  Filled 2020-01-29 (×2): qty 2

## 2020-01-29 MED ORDER — METOCLOPRAMIDE HCL 5 MG/ML IJ SOLN
10.0000 mg | Freq: Four times a day (QID) | INTRAMUSCULAR | Status: AC
  Administered 2020-01-29 – 2020-01-30 (×4): 10 mg via INTRAVENOUS
  Filled 2020-01-29 (×4): qty 2

## 2020-01-29 MED ORDER — FENTANYL CITRATE (PF) 100 MCG/2ML IJ SOLN
50.0000 ug | Freq: Once | INTRAMUSCULAR | Status: AC
  Administered 2020-01-29: 50 ug via INTRAVENOUS
  Filled 2020-01-29: qty 2

## 2020-01-29 MED ORDER — POTASSIUM CHLORIDE 10 MEQ/100ML IV SOLN
10.0000 meq | INTRAVENOUS | Status: AC
  Administered 2020-01-29 (×2): 10 meq via INTRAVENOUS
  Filled 2020-01-29 (×4): qty 100

## 2020-01-29 MED ORDER — LACTATED RINGERS IV BOLUS
2500.0000 mL | Freq: Once | INTRAVENOUS | Status: AC
  Administered 2020-01-29: 2500 mL via INTRAVENOUS

## 2020-01-29 NOTE — Progress Notes (Signed)
HPI: Raymond Rose is a 44 y.o. male with medical history significant for DM 2, OSA on CPAP, chronic pain, depression and anxiety, hospitalized from 8/8-8/10 with DKA presenting with nausea and vomiting who presents to the emergency room with recurrent symptoms.  States he was fine when he went home but symptoms started up again 2 days prior to presentation, with nausea vomiting and also has epigastric discomfort..   ED Course: On arrival, he was tachycardic at 130, elevated BP 171/109, otherwise normal vitals.  Blood work significant for leukocytosis of 13,000 but with normal lactic acid.  Blood sugar 232 with anion gap of 25, serum bicarb 9, beta hydroxybutyrate acid greater than 8.  Lipase normal at 42.  He was started on insulin drip.  01/29/20: Seen and examined at his bedside in the ED, continues to have epigastric pain with nausea and no vomiting.  Currently on insulin drip.  Continue BMP every 4 hours, insulin drip until gap closes and metabolic acidosis improves. then will transition to subcu insulin.  Please refer to H&P dictated by my partner Dr. Damita Dunnings on 01/29/2020 for further details of the assessment and plan.

## 2020-01-29 NOTE — ED Notes (Signed)
Pyxis out of potassium. Called pharmacy to get IV bags sent to ER.

## 2020-01-29 NOTE — H&P (Signed)
History and Physical    Raymond Rose GBT:[id number] DOB: Jul 21, 1975 DOA: 01/29/2020  PCP: Birdie Sons, MD   Patient coming from: Home  I have personally briefly reviewed patient's old medical records in [place]  Chief Complaint: Nausea and vomiting  HPI: Raymond Rose is a 44 y.o. male with medical history significant for DM 2, OSA on CPAP, chronic pain, depression and anxiety, hospitalized from 8/8-8/10 with DKA presenting with nausea and vomiting who presents to the emergency room with recurrent symptoms.  States he was fine when he went home but symptoms started up again 2 days prior to presentation, with nausea vomiting and also has epigastric discomfort.Marland Kitchen  He denies cough, chest pain, fever or chills and denies a change in bowel habits.  Received both Covid shots ED Course: On arrival, he was tachycardic at 130, elevated BP 171/109, otherwise normal vitals.  Blood work significant for leukocytosis of 13,000 but with normal lactic acid.  Blood sugar 232 with anion gap of 25.  Lipase normal at 42.  He was started on a heparin infusion hospitalist consulted for admission  Review of Systems: As per HPI otherwise all other systems on review of systems negative.    Past Medical History:  Diagnosis Date  . Anxiety   . Arthritis   . COVID-19 06/2019  . Cyclical vomiting   . Depression   . Diabetes mellitus, type II ([place])   . OSA on CPAP   . Vertigo 01/03/2015    Past Surgical History:  Procedure Laterality Date  . 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 CHOLECYSTECTOMY  2010   lap Cholecystectomy  . 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 CT scan of Brain  12/23/2011   without contrast, [facility], Normal  . TONSILLECTOMY  1985     reports that he quit smoking about 22 years ago. He quit after 5.00 years of use. He has never used smokeless tobacco. He reports current alcohol use.  He reports that he does not use drugs.  Allergies  Allergen Reactions  . Bupropion     Extreme anxiety  . Morphine     Family History  Problem Relation Age of Onset  . Depression Mother   . Heart attack Father 36  . Arthritis Other   . Alcohol abuse Other   . Lung cancer Other   . Hyperlipidemia Other   . CAD Other   . Stroke Other   . Hypertension Other   . Bipolar disorder Other       Prior to Admission medications   Medication Sig Start Date End Date Taking? Authorizing Provider  ALPRAZolam (XANAX) 0.5 MG tablet TAKE 1 TABLET (0.5 MG TOTAL) BY MOUTH EVERY 4 (FOUR) HOURS AS NEEDED. FOR ANXIETY 12/10/19   Birdie Sons, MD  FARXIGA 10 MG TABS tablet TAKE 1 TABLET BY MOUTH EVERY DAY Patient taking differently: Take 10 mg by mouth daily.  12/27/19   Birdie Sons, MD  glipiZIDE (GLUCOTROL XL) 10 MG 24 hr tablet Take 1 tablet (10 mg total) by mouth daily with breakfast. 11/18/19   Birdie Sons, MD  ketoconazole (NIZORAL) 2 % cream APPLY TO AFFECTED AREA ONCE DAILY FOR 7 DAYS AS NEEDED Patient taking differently: Apply 1 application topically daily. APPLY TO AFFECTED AREA ONCE DAILY FOR 7 DAYS AS NEEDED 03/21/19   Birdie Sons,  MD  lamoTRIgine (LAMICTAL) 150 MG tablet Take 1 tablet (150 mg total) by mouth daily. 12/23/19   Ursula Alert, MD  metFORMIN (GLUCOPHAGE-XR) 500 MG 24 hr tablet Take 2 tablets (1,000 mg total) by mouth 2 (two) times daily. 01/10/20   Birdie Sons, MD  OLANZapine (ZYPREXA) 5 MG tablet Take by mouth. 12/10/19 03/09/20  [provider]  omeprazole (PRILOSEC) 40 MG capsule TAKE 1 CAPSULE (40 MG TOTAL) BY MOUTH DAILY. 12/21/19   Birdie Sons, MD  ondansetron (ZOFRAN) 4 MG tablet Take 1 tablet (4 mg total) by mouth daily as needed for nausea or vomiting. 07/05/19 07/04/20  Sidney Ace, MD  oxyCODONE-acetaminophen (PERCOCET) 7.5-325 MG tablet Take 1 tablet by mouth every 4 (four) hours as needed. 01/18/20   Birdie Sons, MD  Potassium  & Sodium Phosphates (PHOSPHORUS W/SOD & POTASSIUM) 280-160-250 MG PACK Take 1 Package by mouth 3 (three) times daily after meals for 14 days. 01/25/20 02/08/20  Barb Merino, MD  promethazine (PHENERGAN) 25 MG tablet TAKE 1 TABLET (25 MG TOTAL) BY MOUTH EVERY 6 (SIX) HOURS AS NEEDED FOR NAUSEA OR VOMITING. 10/13/19   Birdie Sons, MD  rosuvastatin (CRESTOR) 20 MG tablet TAKE 1 TABLET BY MOUTH EVERY DAY Patient taking differently: Take 20 mg by mouth daily.  12/20/19   Birdie Sons, MD  TRULICITY 1.5 PN/3.6RW SOPN INJECT 1.5 MG INTO THE SKIN ONCE A WEEK. 07/08/19   Birdie Sons, MD  Vilazodone HCl (VIIBRYD) 40 MG TABS Take 1 tablet (40 mg total) by mouth daily. 12/23/19   Ursula Alert, MD    Physical Exam: Vitals:   01/28/20 2358 01/28/20 2359 01/29/20 0106 01/29/20 0200  BP: (!) 171/109  (!) 147/90 125/69  Pulse: (!) 130  (!) 119 (!) 106  Resp: 20  17 15   Temp: 97.6 F (36.4 C)     TempSrc: Oral     SpO2: 98%  100% 99%  Weight:  136.1 kg    Height:  6\' 1"  (1.854 m)       Vitals:   01/28/20 2358 01/28/20 2359 01/29/20 0106 01/29/20 0200  BP: (!) 171/109  (!) 147/90 125/69  Pulse: (!) 130  (!) 119 (!) 106  Resp: 20  17 15   Temp: 97.6 F (36.4 C)     TempSrc: Oral     SpO2: 98%  100% 99%  Weight:  136.1 kg    Height:  6\' 1"  (1.854 m)        Constitutional:  Ill-appearing HEENT:      Head: Normocephalic and atraumatic.         Eyes: PERLA, EOMI, Conjunctivae are normal. Sclera is non-icteric.       Mouth/Throat: Mucous membranes are moist.       Neck: Supple with no signs of meningismus. Cardiovascular: Regular rate and rhythm. No murmurs, gallops, or rubs. 2+ symmetrical distal pulses are present . No JVD. No LE edema Respiratory: Respiratory effort normal .Lungs sounds clear bilaterally. No wheezes, crackles, or rhonchi.  Gastrointestinal:  Epigastric tenderness on palpation, and non distended with positive bowel sounds. No rebound or guarding. Genitourinary: No  CVA tenderness. Musculoskeletal: Nontender with normal range of motion in all extremities. No cyanosis, or erythema of extremities. Neurologic: Normal speech and language. Face is symmetric. Moving all extremities. No gross focal neurologic deficits . Skin: Skin is warm, dry.  No rash or ulcers Psychiatric: Mood and affect are normal Speech and behavior are normal   Labs  on Admission: I have personally reviewed following labs and imaging studies  CBC: Recent Labs  Lab 01/23/20 0933 01/25/20 0413 01/29/20 0101  WBC 14.3* 6.9 13.8*  NEUTROABS  --  3.4 10.7*  HGB 17.0 13.2 17.9*  HCT 52.6* 39.2 52.5*  MCV 90.2 86.9 86.5  PLT 396 187 878   Basic Metabolic Panel: Recent Labs  Lab 01/24/20 0607 01/24/20 1130 01/24/20 1704 01/25/20 0413 01/29/20 0100  NA 137 135 138 138 137  K 3.7 3.4* 3.6 3.5 4.2  CL 111 111 110 107 103  CO2 12* 14* 16* 19* 9*  GLUCOSE 178* 238* 198* 176* 232*  BUN 11 11 12 11 12   CREATININE 0.85 0.82 0.91 0.79 1.20  CALCIUM 8.3* 7.8* 8.3* 8.4* 9.6  MG  --  1.8  --  2.0  --   PHOS  --  1.8*  --  1.9*  --    GFR: Estimated Creatinine Clearance: 117.3 mL/min (by C-G formula based on SCr of 1.2 mg/dL). Liver Function Tests: Recent Labs  Lab 01/23/20 0933 01/29/20 0100  AST 20 18  ALT 21 18  ALKPHOS 106 102  BILITOT 2.5* 2.1*  PROT 9.0* 9.3*  ALBUMIN 4.9 5.0   Recent Labs  Lab 01/23/20 0933 01/29/20 0100  LIPASE 31 42   No results for input(s): AMMONIA in the last 168 hours. Coagulation Profile: No results for input(s): INR, PROTIME in the last 168 hours. Cardiac Enzymes: No results for input(s): CKTOTAL, CKMB, CKMBINDEX, TROPONINI in the last 168 hours. BNP (last 3 results) No results for input(s): PROBNP in the last 8760 hours. HbA1C: No results for input(s): HGBA1C in the last 72 hours. CBG: Recent Labs  Lab 01/24/20 1146 01/24/20 1703 01/24/20 2125 01/25/20 0743 01/29/20 0255  GLUCAP 182* 190* 198* 176* 194*   Lipid  Profile: No results for input(s): CHOL, HDL, LDLCALC, TRIG, CHOLHDL, LDLDIRECT in the last 72 hours. Thyroid Function Tests: No results for input(s): TSH, T4TOTAL, FREET4, T3FREE, THYROIDAB in the last 72 hours. Anemia Panel: No results for input(s): VITAMINB12, FOLATE, FERRITIN, TIBC, IRON, RETICCTPCT in the last 72 hours. Urine analysis:    Component Value Date/Time   COLORURINE YELLOW (A) 01/23/2020 1553   APPEARANCEUR CLEAR (A) 01/23/2020 1553   LABSPEC 1.031 (H) 01/23/2020 1553   PHURINE 5.0 01/23/2020 1553   GLUCOSEU >=500 (A) 01/23/2020 1553   HGBUR NEGATIVE 01/23/2020 1553   BILIRUBINUR NEGATIVE 01/23/2020 1553   BILIRUBINUR Neg 06/22/2015 1136   KETONESUR 80 (A) 01/23/2020 1553   PROTEINUR 30 (A) 01/23/2020 1553   UROBILINOGEN 0.2 06/22/2015 1136   NITRITE NEGATIVE 01/23/2020 1553   LEUKOCYTESUR NEGATIVE 01/23/2020 1553    Radiological Exams on Admission: DG Chest Port 1 View  Result Date: 01/29/2020 CLINICAL DATA:  Vomiting. Recent hospital admission and discharge for DKA. Weakness and shortness of breath. EXAM: PORTABLE CHEST 1 VIEW COMPARISON:  07/03/2019 FINDINGS: The cardiomediastinal contours are normal. The lungs are clear. Pulmonary vasculature is normal. No consolidation, pleural effusion, or pneumothorax. No acute osseous abnormalities are seen. IMPRESSION: Negative AP view of the chest. Electronically Signed   By: Keith Rake M.D.   On: 01/29/2020 01:10     Assessment/Plan 44 year old male with history of chronic pain, anxiety and depression, OSA and DM 2, recently hospitalized for DKA from 8/8 to 8/10 presenting with recurrent symptoms and found again to be in DKA with anion gap of 25  DKA -Presents with nausea and vomiting similar to recent past presentation for DKA with  anion gap of 25, blood sugar 232 -Continue insulin infusion until gap closes -IV hydration with normal saline above 250, D5 NS below per protocol -Diabetic education    Obstructive  sleep apnea -CPAP  Anxiety and depression -Continue home meds  Chronic pain -Continue home meds    DVT prophylaxis: Lovenox  Code Status: full code  Family Communication:  none  Disposition Plan: Back to previous home environment Consults called: non  Status:obs    Athena Masse MD [facility]     01/29/2020, 3:00 AM

## 2020-01-29 NOTE — ED Provider Notes (Signed)
[facility] Emergency Department Provider Note   ____________________________________________   First MD Initiated Contact with Patient 01/29/20 0015     (approximate)  I have reviewed the triage vital signs and the nursing notes.   HISTORY  Chief Complaint Emesis and Shortness of Breath    HPI Raymond Rose is a 44 y.o. male who returns to the ED from home with a chief complaint of upper abdominal pain, nausea/vomiting and concerns for DKA.  Patient was recently hospitalized 8/8 to 01/25/2020 for DKA.  States he felt great on discharge until 2 days ago when he began to have similar symptoms.  Patient also has a history of cyclical vomiting, OSA on CPAP, COVID-19 in January 2021.  Denies fever, cough, chest pain, shortness of breath, diarrhea.  Has had both doses of his COVID-19 vaccinations.       Past Medical History:  Diagnosis Date  . Anxiety   . Arthritis   . COVID-19 06/2019  . Cyclical vomiting   . Depression   . Diabetes mellitus, type II ([place])   . OSA on CPAP   . Vertigo 01/03/2015    Patient Active Problem List   Diagnosis Date Noted  . DKA (diabetic ketoacidosis) ([place]) 01/23/2020  . Chronic pain 01/23/2020  . At risk for prolonged QT interval syndrome 12/23/2019  . MDD (major depressive disorder), recurrent, in partial remission ([place]) 12/23/2019  . Microalbuminuria 08/23/2019  . Personality disorder, unspecified ([place]) 07/12/2019  . Bereavement 07/12/2019  . DKA (diabetic ketoacidoses) ([place]) 07/03/2019  . Depression with anxiety 07/03/2019  . COVID-19 virus infection 07/03/2019  . Nausea vomiting and diarrhea 07/03/2019  . MDD (major depressive disorder), recurrent episode, moderate ([place]) 04/21/2019  . GAD (generalized anxiety disorder) 04/21/2019  . Social anxiety disorder 04/21/2019  . Erectile dysfunction 10/03/2017  . Anxiety 08/08/2017  . Elevated transaminase level 08/28/2016  . Rectus diastasis 02/14/2016  .  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 Panic attacks 06/22/2015  . Depression 06/22/2015  . Diabetes mellitus ([place]) 01/03/2015  . Eating disorder 01/03/2015  . GERD (gastroesophageal reflux disease) 01/03/2015  . Hearing loss 01/03/2015  . Leg cramps 01/03/2015  . Low back pain 01/03/2015  . Adrenal mass, right ([place]) 06/18/2012  . Obstructive sleep apnea 07/08/2010  . Cluster headache 12/01/2008  . Fam hx-ischem heart disease 10/03/2008  . Obesity, Class III, BMI 40-49.9 (morbid obesity) ([place]) 09/30/2008  . Hyperlipidemia, mixed 12/12/2004    Past Surgical History:  Procedure Laterality Date  . 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 CHOLECYSTECTOMY  2010   lap Cholecystectomy  . 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 CT scan of Brain  12/23/2011   without contrast, [facility], Normal  . TONSILLECTOMY  1985    Prior to Admission medications   Medication Sig Start Date End Date Taking? Authorizing Provider  ALPRAZolam (XANAX) 0.5 MG tablet TAKE 1 TABLET (0.5 MG TOTAL) BY MOUTH EVERY 4 (FOUR) HOURS AS NEEDED. FOR ANXIETY 12/10/19   Birdie Sons, MD  FARXIGA 10 MG TABS tablet TAKE 1 TABLET BY MOUTH EVERY DAY Patient taking differently: Take 10 mg by mouth daily.  12/27/19   Birdie Sons, MD  glipiZIDE (GLUCOTROL XL) 10 MG 24 hr tablet Take 1 tablet (10 mg total) by mouth daily with breakfast. 11/18/19   Birdie Sons, MD  ketoconazole (NIZORAL) 2 % cream  APPLY TO AFFECTED AREA ONCE DAILY FOR 7 DAYS AS NEEDED Patient taking differently: Apply 1 application topically daily. APPLY TO AFFECTED AREA ONCE DAILY FOR 7 DAYS AS NEEDED 03/21/19   Birdie Sons, MD  lamoTRIgine (LAMICTAL) 150 MG tablet Take 1 tablet (150 mg total) by mouth daily. 12/23/19   Ursula Alert, MD  metFORMIN (GLUCOPHAGE-XR) 500 MG 24 hr tablet Take 2 tablets (1,000 mg total) by mouth 2 (two) times daily. 01/10/20    Birdie Sons, MD  OLANZapine (ZYPREXA) 5 MG tablet Take by mouth. 12/10/19 03/09/20  [provider]  omeprazole (PRILOSEC) 40 MG capsule TAKE 1 CAPSULE (40 MG TOTAL) BY MOUTH DAILY. 12/21/19   Birdie Sons, MD  ondansetron (ZOFRAN) 4 MG tablet Take 1 tablet (4 mg total) by mouth daily as needed for nausea or vomiting. 07/05/19 07/04/20  Sidney Ace, MD  oxyCODONE-acetaminophen (PERCOCET) 7.5-325 MG tablet Take 1 tablet by mouth every 4 (four) hours as needed. 01/18/20   Birdie Sons, MD  Potassium & Sodium Phosphates (PHOSPHORUS W/SOD & POTASSIUM) 280-160-250 MG PACK Take 1 Package by mouth 3 (three) times daily after meals for 14 days. 01/25/20 02/08/20  Barb Merino, MD  promethazine (PHENERGAN) 25 MG tablet TAKE 1 TABLET (25 MG TOTAL) BY MOUTH EVERY 6 (SIX) HOURS AS NEEDED FOR NAUSEA OR VOMITING. 10/13/19   Birdie Sons, MD  rosuvastatin (CRESTOR) 20 MG tablet TAKE 1 TABLET BY MOUTH EVERY DAY Patient taking differently: Take 20 mg by mouth daily.  12/20/19   Birdie Sons, MD  TRULICITY 1.5 EH/2.1YY SOPN INJECT 1.5 MG INTO THE SKIN ONCE A WEEK. 07/08/19   Birdie Sons, MD  Vilazodone HCl (VIIBRYD) 40 MG TABS Take 1 tablet (40 mg total) by mouth daily. 12/23/19   Ursula Alert, MD    Allergies Bupropion and Morphine  Family History  Problem Relation Age of Onset  . Depression Mother   . Heart attack Father 92  . Arthritis Other   . Alcohol abuse Other   . Lung cancer Other   . Hyperlipidemia Other   . CAD Other   . Stroke Other   . Hypertension Other   . Bipolar disorder Other     Social History Social History   Tobacco Use  . Smoking status: Former Smoker    Years: 5.00    Quit date: 06/17/1997    Years since quitting: 22.6  . Smokeless tobacco: Never Used  . Tobacco comment: started smoking at ahe 16, and quit at age 66  Vaping Use  . Vaping Use: Never used  Substance Use Topics  . Alcohol use: Yes    Alcohol/week: 0.0 standard drinks     Comment: occasional alcohol use  . Drug use: No    Review of Systems  Constitutional: No fever/chills Eyes: No visual changes. ENT: No sore throat. Cardiovascular: Denies chest pain. Respiratory: Denies shortness of breath. Gastrointestinal: Positive for abdominal pain, nausea and vomiting.  No diarrhea.  No constipation. Genitourinary: Negative for dysuria. Musculoskeletal: Negative for back pain. Skin: Negative for rash. Neurological: Negative for headaches, focal weakness or numbness.   ____________________________________________   PHYSICAL EXAM:  VITAL SIGNS: ED Triage Vitals  Enc Vitals Group     BP 01/28/20 2358 (!) 171/109     Pulse Rate 01/28/20 2358 (!) 130     Resp 01/28/20 2358 20     Temp 01/28/20 2358 97.6 F (36.4 C)     Temp Source 01/28/20 2358 Oral  SpO2 01/28/20 2358 98 %     Weight 01/28/20 2359 300 lb (136.1 kg)     Height 01/28/20 2359 6\' 1"  (1.854 m)     Head Circumference --      Peak Flow --      Pain Score 01/28/20 2359 10     Pain Loc --      Pain Edu? --      Excl. in [place]? --     Constitutional: Alert and oriented.  Ill appearing and in moderate acute distress. Eyes: Conjunctivae are normal. PERRL. EOMI. Head: Atraumatic. Nose: No congestion/rhinnorhea. Mouth/Throat: Mucous membranes are mildly dry.   Neck: No stridor.   Cardiovascular: Tachycardic rate, regular rhythm. Grossly normal heart sounds.  Good peripheral circulation. Respiratory: Normal respiratory effort.  No retractions. Lungs CTAB. Gastrointestinal: Soft and mildly tender to palpation epigastrium without rebound or guarding. No distention. No abdominal bruits. No CVA tenderness. Musculoskeletal: No lower extremity tenderness nor edema.  No joint effusions. Neurologic:  Normal speech and language. No gross focal neurologic deficits are appreciated.  Skin:  Skin is warm, dry and intact. No rash noted. Psychiatric: Mood and affect are normal. Speech and behavior are  normal.  ____________________________________________   LABS (all labs ordered are listed, but only abnormal results are displayed)  Labs Reviewed  CBC WITH DIFFERENTIAL/PLATELET - Abnormal; Notable for the following components:      Result Value   WBC 13.8 (*)    RBC 6.07 (*)    Hemoglobin 17.9 (*)    HCT 52.5 (*)    Neutro Abs 10.7 (*)    Abs Immature Granulocytes 0.29 (*)    All other components within normal limits  BLOOD GAS, VENOUS - Abnormal; Notable for the following components:   pH, Ven 7.10 (*)    pCO2, Ven 24 (*)    Bicarbonate 7.5 (*)    Acid-base deficit 20.4 (*)    All other components within normal limits  COMPREHENSIVE METABOLIC PANEL - Abnormal; Notable for the following components:   CO2 9 (*)    Glucose, Bld 232 (*)    Total Protein 9.3 (*)    Total Bilirubin 2.1 (*)    Anion gap 25 (*)    All other components within normal limits  SARS CORONAVIRUS 2 BY RT PCR (HOSPITAL ORDER, [facility])  LACTIC ACID, PLASMA  LIPASE, BLOOD  URINALYSIS, COMPLETE (UACMP) WITH MICROSCOPIC  BETA-HYDROXYBUTYRIC ACID  TROPONIN I (HIGH SENSITIVITY)  TROPONIN I (HIGH SENSITIVITY)   ____________________________________________  EKG  ED ECG REPORT I, Dennis Killilea J, the attending physician, personally viewed and interpreted this ECG.   Date: 01/29/2020  EKG Time: 2359  Rate: 137  Rhythm: sinus tachycardia  Axis: Normal  Intervals:none  ST&T Change: Nonspecific  ____________________________________________  RADIOLOGY  ED MD interpretation: No acute cardiopulmonary process  Official radiology report(s): DG Chest Port 1 View  Result Date: 01/29/2020 CLINICAL DATA:  Vomiting. Recent hospital admission and discharge for DKA. Weakness and shortness of breath. EXAM: PORTABLE CHEST 1 VIEW COMPARISON:  07/03/2019 FINDINGS: The cardiomediastinal contours are normal. The lungs are clear. Pulmonary vasculature is normal. No consolidation, pleural  effusion, or pneumothorax. No acute osseous abnormalities are seen. IMPRESSION: Negative AP view of the chest. Electronically Signed   By: Keith Rake M.D.   On: 01/29/2020 01:10    ____________________________________________   PROCEDURES  Procedure(s) performed (including Critical Care):  .1-3 Lead EKG Interpretation Performed by: Paulette Blanch, MD Authorized by: Lurline Hare  J, MD     Interpretation: abnormal     ECG rate:  130   ECG rate assessment: tachycardic     Rhythm: sinus tachycardia     Ectopy: none     Conduction: normal   Comments:     Patient placed on cardiac monitor to evaluate for arrhythmias   CRITICAL CARE Performed by: Paulette Blanch   Total critical care time: 45 minutes  Critical care time was exclusive of separately billable procedures and treating other patients.  Critical care was necessary to treat or prevent imminent or life-threatening deterioration.  Critical care was time spent personally by me on the following activities: development of treatment plan with patient and/or surrogate as well as nursing, discussions with consultants, evaluation of patient's response to treatment, examination of patient, obtaining history from patient or surrogate, ordering and performing treatments and interventions, ordering and review of laboratory studies, ordering and review of radiographic studies, pulse oximetry and re-evaluation of patient's condition.   ____________________________________________   INITIAL IMPRESSION / ASSESSMENT AND PLAN / ED COURSE  As part of my medical decision making, I reviewed the following data within the [place] notes reviewed and incorporated, Labs reviewed, EKG interpreted, Old chart reviewed, Radiograph reviewed, Discussed with admitting physician and Notes from prior ED visits     OSSIE BELTRAN was evaluated in Emergency Department on 01/29/2020 for the symptoms described in the history of  present illness. He was evaluated in the context of the global COVID-19 pandemic, which necessitated consideration that the patient might be at risk for infection with the SARS-CoV-2 virus that causes COVID-19. Institutional protocols and algorithms that pertain to the evaluation of patients at risk for COVID-19 are in a state of rapid change based on information released by regulatory bodies including the CDC and federal and state organizations. These policies and algorithms were followed during the patient's care in the ED.    44 year old male with recent admission for DKA returns with similar symptoms of epigastric abdominal pain, nausea/vomiting. Differential diagnosis includes, but is not limited to, DKA, biliary disease (biliary colic, acute cholecystitis, cholangitis, choledocholithiasis, etc), intrathoracic causes for epigastric abdominal pain including ACS, gastritis, duodenitis, pancreatitis, small bowel or large bowel obstruction, abdominal aortic aneurysm, hernia, and ulcer(s).  We will obtain lab work including VBG and beta hydroxy butyric acid.  Initiate IV hydration, IV fentanyl for pain, IV Zofran for nausea/vomiting, IV Pepcid and reassess.   Clinical Course as of Jan 28 206  Sat Jan 29, 2020  0206 AG 25, VBG pH 7.10.  Have ordered insulin drip and will discuss with hospitalist services for admission.   [JS]    Clinical Course User Index [JS] Paulette Blanch, MD     ____________________________________________   FINAL CLINICAL IMPRESSION(S) / ED DIAGNOSES  Final diagnoses:  Diabetic ketoacidosis without coma associated with type 2 diabetes mellitus [facility])     ED Discharge Orders    None       Note:  This document was prepared using Dragon voice recognition software and may include unintentional dictation errors.   Paulette Blanch, MD 01/29/20 [phone]

## 2020-01-29 NOTE — Progress Notes (Addendum)
Inpatient Diabetes Program Recommendations  AACE/ADA: New Consensus Statement on Inpatient Glycemic Control (2015)  Target Ranges:  Prepandial:   less than 140 mg/dL      Peak postprandial:   less than 180 mg/dL (1-2 hours)      Critically ill patients:  140 - 180 mg/dL   Lab Results  Component Value Date   GLUCAP 120 (H) 01/29/2020   HGBA1C 10.1 (H) 01/23/2020    Review of Glycemic Control Results for Raymond Rose, Raymond Rose (MRN [id number]) as of 01/29/2020 08:39  Ref. Range 01/29/2020 02:55 01/29/2020 04:07 01/29/2020 05:01 01/29/2020 06:08 01/29/2020 08:27  Glucose-Capillary Latest Ref Range: 70 - 99 mg/dL 194 (H) 153 (H) 149 (H) 165 (H) 120 (H)  Results for Raymond Rose, Raymond Rose (MRN [id number]) as of 01/29/2020 08:39  Ref. Range 01/29/2020 06:36  Anion gap Latest Ref Range: 5 - 15  17 (H)   Diabetes history:  DM2  Outpatient Diabetes medications:  Trulicity 1.5 mg weekly Metformin 1000 BID (hasn't been taking) Farxiga 10 mg daily Glipizide 10 mg QAM  Current orders for Inpatient glycemic control:  IV insulin  Inpatient Diabetes Program Recommendations:    When gap is closed and MD is ready to transition to SQ insulin, please consider,  1) Lantus 15 units 1-2 hrs prior to discontinuing IV insulin 2) Novolog 0-9 units TID  3) Novolog 0-5 units QHS 4) Novolog 3 units TID with meals if eats at least 50% of meal  Will speak with patient today.  Will continue to follow while inpatient.  Thank you, Reche Dixon, RN, BSN Diabetes Coordinator Inpatient Diabetes Program [phone] (team pager from 8a-5p)

## 2020-01-29 NOTE — ED Notes (Signed)
Pt placed on a hospital bed for comfort

## 2020-01-30 LAB — CBC WITH DIFFERENTIAL/PLATELET
Abs Immature Granulocytes: 0.07 10*3/uL (ref 0.00–0.07)
Basophils Absolute: 0.1 10*3/uL (ref 0.0–0.1)
Basophils Relative: 1 %
Eosinophils Absolute: 0.3 10*3/uL (ref 0.0–0.5)
Eosinophils Relative: 5 %
HCT: 41.8 % (ref 39.0–52.0)
Hemoglobin: 14 g/dL (ref 13.0–17.0)
Immature Granulocytes: 1 %
Lymphocytes Relative: 43 %
Lymphs Abs: 3 10*3/uL (ref 0.7–4.0)
MCH: 29.4 pg (ref 26.0–34.0)
MCHC: 33.5 g/dL (ref 30.0–36.0)
MCV: 87.6 fL (ref 80.0–100.0)
Monocytes Absolute: 0.4 10*3/uL (ref 0.1–1.0)
Monocytes Relative: 7 %
Neutro Abs: 2.9 10*3/uL (ref 1.7–7.7)
Neutrophils Relative %: 43 %
Platelets: 187 10*3/uL (ref 150–400)
RBC: 4.77 MIL/uL (ref 4.22–5.81)
RDW: 14.9 % (ref 11.5–15.5)
WBC: 6.8 10*3/uL (ref 4.0–10.5)
nRBC: 0 % (ref 0.0–0.2)

## 2020-01-30 LAB — BASIC METABOLIC PANEL
Anion gap: 12 (ref 5–15)
Anion gap: 15 (ref 5–15)
Anion gap: 14 (ref 5–15)
BUN: <5 mg/dL — ABNORMAL LOW (ref 6–20)
BUN: <5 mg/dL — ABNORMAL LOW (ref 6–20)
BUN: <5 mg/dL — ABNORMAL LOW (ref 6–20)
CO2: 20 mmol/L — ABNORMAL LOW (ref 22–32)
CO2: 18 mmol/L — ABNORMAL LOW (ref 22–32)
CO2: 19 mmol/L — ABNORMAL LOW (ref 22–32)
Calcium: 8.3 mg/dL — ABNORMAL LOW (ref 8.9–10.3)
Calcium: 8.6 mg/dL — ABNORMAL LOW (ref 8.9–10.3)
Calcium: 8.6 mg/dL — ABNORMAL LOW (ref 8.9–10.3)
Chloride: 107 mmol/L (ref 98–111)
Chloride: 106 mmol/L (ref 98–111)
Chloride: 105 mmol/L (ref 98–111)
Creatinine, Ser: 0.65 mg/dL (ref 0.61–1.24)
Creatinine, Ser: 0.86 mg/dL (ref 0.61–1.24)
Creatinine, Ser: 0.75 mg/dL (ref 0.61–1.24)
GFR calc Af Amer: >60 mL/min (ref >60)
GFR calc Af Amer: >60 mL/min (ref >60)
GFR calc Af Amer: >60 mL/min (ref >60)
GFR calc non Af Amer: >60 mL/min (ref >60)
GFR calc non Af Amer: >60 mL/min (ref >60)
GFR calc non Af Amer: >60 mL/min (ref >60)
Glucose, Bld: 161 mg/dL — ABNORMAL HIGH (ref 70–99)
Glucose, Bld: 146 mg/dL — ABNORMAL HIGH (ref 70–99)
Glucose, Bld: 158 mg/dL — ABNORMAL HIGH (ref 70–99)
Potassium: 3 mmol/L — ABNORMAL LOW (ref 3.5–5.1)
Potassium: 2.7 mmol/L — CL (ref 3.5–5.1)
Potassium: 3.4 mmol/L — ABNORMAL LOW (ref 3.5–5.1)
Sodium: 139 mmol/L (ref 135–145)
Sodium: 139 mmol/L (ref 135–145)
Sodium: 138 mmol/L (ref 135–145)

## 2020-01-30 LAB — BETA-HYDROXYBUTYRIC ACID
Beta-Hydroxybutyric Acid: 4.13 mmol/L — ABNORMAL HIGH (ref 0.05–0.27)
Beta-Hydroxybutyric Acid: 4.02 mmol/L — ABNORMAL HIGH (ref 0.05–0.27)

## 2020-01-30 LAB — GLUCOSE, CAPILLARY
Glucose-Capillary: 133 mg/dL — ABNORMAL HIGH (ref 70–99)
Glucose-Capillary: 133 mg/dL — ABNORMAL HIGH (ref 70–99)
Glucose-Capillary: 137 mg/dL — ABNORMAL HIGH (ref 70–99)
Glucose-Capillary: 141 mg/dL — ABNORMAL HIGH (ref 70–99)
Glucose-Capillary: 142 mg/dL — ABNORMAL HIGH (ref 70–99)
Glucose-Capillary: 142 mg/dL — ABNORMAL HIGH (ref 70–99)
Glucose-Capillary: 144 mg/dL — ABNORMAL HIGH (ref 70–99)
Glucose-Capillary: 145 mg/dL — ABNORMAL HIGH (ref 70–99)
Glucose-Capillary: 146 mg/dL — ABNORMAL HIGH (ref 70–99)
Glucose-Capillary: 147 mg/dL — ABNORMAL HIGH (ref 70–99)
Glucose-Capillary: 149 mg/dL — ABNORMAL HIGH (ref 70–99)
Glucose-Capillary: 152 mg/dL — ABNORMAL HIGH (ref 70–99)
Glucose-Capillary: 153 mg/dL — ABNORMAL HIGH (ref 70–99)
Glucose-Capillary: 162 mg/dL — ABNORMAL HIGH (ref 70–99)

## 2020-01-30 MED ORDER — LORAZEPAM 2 MG/ML IJ SOLN
1.0000 mg | Freq: Once | INTRAMUSCULAR | Status: AC
  Administered 2020-01-30: 1 mg via INTRAVENOUS

## 2020-01-30 MED ORDER — SODIUM BICARBONATE 650 MG PO TABS
650.0000 mg | ORAL_TABLET | Freq: Three times a day (TID) | ORAL | Status: AC
  Administered 2020-01-30 – 2020-01-31 (×3): 650 mg via ORAL
  Filled 2020-01-30 (×3): qty 1

## 2020-01-30 MED ORDER — POTASSIUM CHLORIDE 10 MEQ/100ML IV SOLN
10.0000 meq | INTRAVENOUS | Status: AC
  Administered 2020-01-30 (×4): 10 meq via INTRAVENOUS
  Filled 2020-01-30 (×5): qty 100

## 2020-01-30 MED ORDER — LORAZEPAM 2 MG/ML IJ SOLN
INTRAMUSCULAR | Status: AC
  Filled 2020-01-30: qty 1

## 2020-01-30 MED ORDER — INSULIN ASPART 100 UNIT/ML ~~LOC~~ SOLN
3.0000 [IU] | Freq: Three times a day (TID) | SUBCUTANEOUS | Status: DC
  Administered 2020-01-30 – 2020-01-31 (×3): 3 [IU] via SUBCUTANEOUS
  Filled 2020-01-30 (×2): qty 1

## 2020-01-30 MED ORDER — INSULIN ASPART 100 UNIT/ML ~~LOC~~ SOLN: 0.0000 [IU] | Freq: Every day | SUBCUTANEOUS | Status: DC

## 2020-01-30 MED ORDER — ALPRAZOLAM 0.25 MG PO TABS
0.5000 mg | ORAL_TABLET | ORAL | Status: DC | PRN
  Administered 2020-01-31: 0.5 mg via ORAL
  Filled 2020-01-30: qty 1

## 2020-01-30 MED ORDER — POTASSIUM CHLORIDE CRYS ER 20 MEQ PO TBCR
40.0000 meq | EXTENDED_RELEASE_TABLET | Freq: Two times a day (BID) | ORAL | Status: DC
  Administered 2020-01-31: 40 meq via ORAL
  Filled 2020-01-30: qty 2

## 2020-01-30 MED ORDER — INSULIN GLARGINE 100 UNIT/ML ~~LOC~~ SOLN
15.0000 [IU] | Freq: Every day | SUBCUTANEOUS | Status: DC
  Administered 2020-01-30 – 2020-01-31 (×2): 15 [IU] via SUBCUTANEOUS
  Filled 2020-01-30 (×4): qty 0.15

## 2020-01-30 MED ORDER — INSULIN ASPART 100 UNIT/ML ~~LOC~~ SOLN
0.0000 [IU] | Freq: Three times a day (TID) | SUBCUTANEOUS | Status: DC
  Administered 2020-01-30: 2 [IU] via SUBCUTANEOUS
  Administered 2020-01-31 (×2): 3 [IU] via SUBCUTANEOUS
  Filled 2020-01-30 (×3): qty 1

## 2020-01-30 MED ORDER — KCL-LACTATED RINGERS-D5W 20 MEQ/L IV SOLN
INTRAVENOUS | Status: DC
  Filled 2020-01-30 (×4): qty 1000

## 2020-01-30 MED ORDER — DEXTROSE 5 % IV SOLN: Freq: Once | INTRAVENOUS | Status: AC

## 2020-01-30 NOTE — ED Notes (Signed)
Pt reporting headache.

## 2020-01-30 NOTE — Progress Notes (Addendum)
PROGRESS NOTE  Raymond Rose SAY:[id number] DOB: 19-Mar-1976 DOA: 01/29/2020 PCP: Birdie Sons, MD  HPI/Recap of past 24 hours: [name]:[name] a 44 y.o.malewith medical history significant forDM 2, OSA on CPAP, chronic pain, depression and anxiety, hospitalized from 8/8-8/10 with DKA presenting with nausea and vomiting who presents to the emergency room with recurrent symptoms. States he was fine when he went home but symptoms started up again 2 days prior to presentation, with nausea vomiting and also has epigastric discomfort..   ED Course:On arrival, he was tachycardic at 130, elevated BP 171/109, otherwise normal vitals. Blood work significant for leukocytosis of 13,000 but with normal lactic acid. Blood sugar 232 with anion gap of 25, serum bicarb 9, beta hydroxybutyrate acid greater than 8. Lipase normal at 42. He was started on insulin drip.  Ongoing treatment for DKA.  01/30/20: Seen and examined at his bedside in the ED.  Reports epigastric discomfort, currently on IV Protonix twice daily.  Assessment/Plan: Active Problems:   Obstructive sleep apnea   DKA (diabetic ketoacidoses) ([place])   DKA, type 2 ([place])  DKA, type II. -Presents with nausea and vomiting similar to recent past presentation for DKA with anion gap of 25, serum bicarb of 9, blood sugar 232 -Currently on insulin drip, transitioning to subcu insulin Start Lantus 15 units, continue insulin drip x2 hours after start of Lantus, continue IV fluids, carb modified diet. Hemoglobin A1c 10.1 on 01/23/2020 Appreciate diabetes coordinator assistance Repeat BMP beta hydroxybutyrate acid in the morning  Anion gap metabolic acidosis in the setting of DKA Last serum bicarb 19 Replete with sodium bicarb orally 3 times daily x1 day. Repeat BMP in the AM  Epigastric pain, suspect chronic States previously seen by GI and on PPI Serum lipase normal Continue PPI twice daily  Hypokalemia Serum potassium  2.7>3.4 Replete as needed    Obstructive sleep apnea -CPAP  Anxiety and depression -Continue home meds  Chronic pain -Continue home meds    DVT prophylaxis: Lovenox subcu daily Code Status: full code  Family Communication:  none   Consults called: none    Status is: Inpatient   Dispo:  Patient From: Home  Planned Disposition: Home  Expected discharge date: 01/31/20  Medically stable for discharge: No, ongoing management of DKA.         Objective: Vitals:   01/30/20 0900 01/30/20 1030 01/30/20 1115 01/30/20 1300  BP: 118/72 102/63  119/86  Pulse: (!) 103 82  (!) 105  Resp: 13 13 10 14   Temp:      TempSrc:      SpO2: 95% 95%  95%  Weight:      Height:        Intake/Output Summary (Last 24 hours) at 01/30/2020 1345 Last data filed at 01/30/2020 0645 Gross per 24 hour  Intake 4026.66 ml  Output 1000 ml  Net 3026.66 ml   Filed Weights   01/28/20 2359  Weight: 136.1 kg    Exam:  . General: 44 y.o. year-old male well developed well nourished in no acute distress.  Alert and oriented x3. . Cardiovascular: Regular rate and rhythm with no rubs or gallops.  No thyromegaly or JVD noted.   Marland Kitchen Respiratory: Clear to auscultation with no wheezes or rales. Good inspiratory effort. . Abdomen: Soft mild tenderness with palpation at epigastric region.  Bowel sounds present.   . Musculoskeletal: No lower extremity edema. 2/4 pulses in all 4 extremities. Marland Kitchen Psychiatry: Mood is appropriate for condition and  setting   Data Reviewed: CBC: Recent Labs  Lab 01/25/20 0413 01/29/20 0101 01/30/20 0303  WBC 6.9 13.8* 6.8  NEUTROABS 3.4 10.7* 2.9  HGB 13.2 17.9* 14.0  HCT 39.2 52.5* 41.8  MCV 86.9 86.5 87.6  PLT 187 321 030   Basic Metabolic Panel: Recent Labs  Lab 01/24/20 1130 01/24/20 1704 01/25/20 0413 01/29/20 0100 01/29/20 1841 01/29/20 2206 01/30/20 0303 01/30/20 0639 01/30/20 0925  NA 135   < > 138   < > 137 139 139 139 138  K 3.4*   < > 3.5    < > 3.6 3.4* 3.0* 2.7* 3.4*  CL 111   < > 107   < > 108 106 107 106 105  CO2 14*   < > 19*   < > 17* 15* 20* 18* 19*  GLUCOSE 238*   < > 176*   < > 151* 163* 161* 146* 158*  BUN 11   < > 11   < > 6 5* <5* <5* <5*  CREATININE 0.82   < > 0.79   < > 0.75 0.73 0.65 0.86 0.75  CALCIUM 7.8*   < > 8.4*   < > 8.5* 8.8* 8.3* 8.6* 8.6*  MG 1.8  --  2.0  --   --   --   --   --   --   PHOS 1.8*  --  1.9*  --   --   --   --   --   --    < > = values in this interval not displayed.   GFR: Estimated Creatinine Clearance: 176 mL/min (by C-G formula based on SCr of 0.75 mg/dL). Liver Function Tests: Recent Labs  Lab 01/29/20 0100  AST 18  ALT 18  ALKPHOS 102  BILITOT 2.1*  PROT 9.3*  ALBUMIN 5.0   Recent Labs  Lab 01/29/20 0100  LIPASE 42   No results for input(s): AMMONIA in the last 168 hours. Coagulation Profile: No results for input(s): INR, PROTIME in the last 168 hours. Cardiac Enzymes: No results for input(s): CKTOTAL, CKMB, CKMBINDEX, TROPONINI in the last 168 hours. BNP (last 3 results) No results for input(s): PROBNP in the last 8760 hours. HbA1C: No results for input(s): HGBA1C in the last 72 hours. CBG: Recent Labs  Lab 01/30/20 0924 01/30/20 1036 01/30/20 1128 01/30/20 1229 01/30/20 1328  GLUCAP 141* 142* 133* 137* 145*   Lipid Profile: No results for input(s): CHOL, HDL, LDLCALC, TRIG, CHOLHDL, LDLDIRECT in the last 72 hours. Thyroid Function Tests: No results for input(s): TSH, T4TOTAL, FREET4, T3FREE, THYROIDAB in the last 72 hours. Anemia Panel: No results for input(s): VITAMINB12, FOLATE, FERRITIN, TIBC, IRON, RETICCTPCT in the last 72 hours. Urine analysis:    Component Value Date/Time   COLORURINE YELLOW (A) 01/29/2020 0605   APPEARANCEUR CLEAR (A) 01/29/2020 0605   LABSPEC 1.024 01/29/2020 0605   PHURINE 5.0 01/29/2020 0605   GLUCOSEU >=500 (A) 01/29/2020 0605   HGBUR SMALL (A) 01/29/2020 0605   BILIRUBINUR NEGATIVE 01/29/2020 0605   BILIRUBINUR Neg  06/22/2015 1136   KETONESUR 80 (A) 01/29/2020 0605   PROTEINUR 100 (A) 01/29/2020 0605   UROBILINOGEN 0.2 06/22/2015 1136   NITRITE NEGATIVE 01/29/2020 0605   LEUKOCYTESUR NEGATIVE 01/29/2020 0605   Sepsis Labs: @LABRCNTIP (procalcitonin:4,lacticidven:4)  ) Recent Results (from the past 240 hour(s))  SARS Coronavirus 2 by RT PCR (hospital order, performed in [place] hospital lab) Nasopharyngeal Nasopharyngeal Swab     Status: None   Collection  Time: 01/23/20  3:53 PM   Specimen: Nasopharyngeal Swab  Result Value Ref Range Status   SARS Coronavirus 2 NEGATIVE NEGATIVE Final    Comment: (NOTE) SARS-CoV-2 target nucleic acids are NOT DETECTED.  The SARS-CoV-2 RNA is generally detectable in upper and lower respiratory specimens during the acute phase of infection. The lowest concentration of SARS-CoV-2 viral copies this assay can detect is 250 copies / mL. A negative result does not preclude SARS-CoV-2 infection and should not be used as the sole basis for treatment or other patient management decisions.  A negative result may occur with improper specimen collection / handling, submission of specimen other than nasopharyngeal swab, presence of viral mutation(s) within the areas targeted by this assay, and inadequate number of viral copies (<250 copies / mL). A negative result must be combined with clinical observations, patient history, and epidemiological information.  Fact Sheet for Patients:   StrictlyIdeas.no  Fact Sheet for Healthcare Providers: BankingDealers.co.za  This test is not yet approved or  cleared by the [place] FDA and has been authorized for detection and/or diagnosis of SARS-CoV-2 by FDA under an Emergency Use Authorization (EUA).  This EUA will remain in effect (meaning this test can be used) for the duration of the COVID-19 declaration under Section 564(b)(1) of the Act, 21 U.S.C. section  360bbb-3(b)(1), unless the authorization is terminated or revoked sooner.  Performed at [facility], [address] [place], [place] [address]   SARS Coronavirus 2 by RT PCR (hospital order, performed in [facility] hospital lab) Nasopharyngeal Nasopharyngeal Swab     Status: None   Collection Time: 01/29/20  1:00 AM   Specimen: Nasopharyngeal Swab  Result Value Ref Range Status   SARS Coronavirus 2 NEGATIVE NEGATIVE Final    Comment: (NOTE) SARS-CoV-2 target nucleic acids are NOT DETECTED.  The SARS-CoV-2 RNA is generally detectable in upper and lower respiratory specimens during the acute phase of infection. The lowest concentration of SARS-CoV-2 viral copies this assay can detect is 250 copies / mL. A negative result does not preclude SARS-CoV-2 infection and should not be used as the sole basis for treatment or other patient management decisions.  A negative result may occur with improper specimen collection / handling, submission of specimen other than nasopharyngeal swab, presence of viral mutation(s) within the areas targeted by this assay, and inadequate number of viral copies (<250 copies / mL). A negative result must be combined with clinical observations, patient history, and epidemiological information.  Fact Sheet for Patients:   StrictlyIdeas.no  Fact Sheet for Healthcare Providers: BankingDealers.co.za  This test is not yet approved or  cleared by the [place] FDA and has been authorized for detection and/or diagnosis of SARS-CoV-2 by FDA under an Emergency Use Authorization (EUA).  This EUA will remain in effect (meaning this test can be used) for the duration of the COVID-19 declaration under Section 564(b)(1) of the Act, 21 U.S.C. section 360bbb-3(b)(1), unless the authorization is terminated or revoked sooner.  Performed at [facility], [address], [place], [place] [address]         Studies: No results found.  Scheduled Meds: . enoxaparin (LOVENOX) injection  40 mg Subcutaneous BID  . pantoprazole (PROTONIX) IV  40 mg Intravenous Q24H    Continuous Infusions: . sodium chloride    . dextrose 5% lactated ringers with KCl 20 mEq/L 100 mL/hr at 01/30/20 0834  . insulin 1.5 Units/hr (01/30/20 1330)     LOS: 1 day  Kayleen Memos, MD [facility] Pager [phone]  If 7PM-7AM, please contact night-coverage [email or web address] Password [email or web address] 01/30/2020, 1:45 PM

## 2020-01-30 NOTE — ED Notes (Signed)
Pt complaining of difficulty breathing and severe anxiety. Pt found sitting up in bed sobbing and stating that he "needs to get out of this room". He stated that this feels like a panic attack and that he has not received his anti anxiety meds since he arrived in the ED on Friday. Pt reassured and NP Sharion Settler contacted. Received orders for 1mg  ativan IV and added PRN orders for xanax 0.5mg .

## 2020-01-30 NOTE — ED Notes (Signed)
LR D5% is out. No more in ED supply. RN called supply chain for more.

## 2020-01-30 NOTE — Progress Notes (Addendum)
Inpatient Diabetes Program Recommendations  AACE/ADA: New Consensus Statement on Inpatient Glycemic Control (2015)  Target Ranges:  Prepandial:   less than 140 mg/dL      Peak postprandial:   less than 180 mg/dL (1-2 hours)      Critically ill patients:  140 - 180 mg/dL   Lab Results  Component Value Date   GLUCAP 146 (H) 01/30/2020   HGBA1C 10.1 (H) 01/23/2020    Review of Glycemic Control Results for Raymond Rose, Raymond Rose (MRN [id number]) as of 01/30/2020 08:07  Ref. Range 01/30/2020 00:31 01/30/2020 01:30 01/30/2020 03:06 01/30/2020 05:04 01/30/2020 06:40  Glucose-Capillary Latest Ref Range: 70 - 99 mg/dL 153 (H) 144 (H) 162 (H) 149 (H) 146 (H)  Results for SAVYON, LOKEN (MRN [id number]) as of 01/30/2020 08:07  Ref. Range 01/30/2020 03:03  Beta-Hydroxybutyric Acid Latest Ref Range: 0.05 - 0.27 mmol/L 4.13 (H)  Results for KESHAWN, FIORITO (MRN [id number]) as of 01/30/2020 08:07  Ref. Range 01/30/2020 06:39  Anion gap Latest Ref Range: 5 - 15  15  Results for JAMIN, PANTHER (MRN [id number]) as of 01/30/2020 08:07  Ref. Range 01/30/2020 06:39  Potassium Latest Ref Range: 3.5 - 5.1 mmol/L 2.7 (LL)   Diabetes history:  DM2  Outpatient Diabetes medications:  Trulicity 1.5 mg weekly Metformin 1000 BID (hasn't been taking) Farxiga 10 mg daily Glipizide 10 mg QAM  Current orders for Inpatient glycemic control:  IV insulin  Inpatient Diabetes Program Recommendations:    Hold IV insulin until K+  is 3.5 or greater, then...  Please consider,  Increasing rate of dextrose and/or allow patient to eat a carb modified diet to help clear acidosis.  Once gap is closed or BHB < 0.5, please consider,   1) Lantus 15 units 1-2 hrs prior to discontinuing IV insulin 2) Novolog 0-9 units TID  3) Novolog 0-5 units QHS 4) Novolog 3 units TID with meals if eats at least 50% of meal  Note: Spoke with patient on the phone.  He is unsure what caused this readmission with DKA again.  He has been taking  his medications as prescribed.  Does not drink any beverages with sugar.  He only eats once a day ad has about 60 CHO with his meal.  He suffers from cyclic vomiting.  DM coordinator spoke with patient at length last admission.  Recommend insulin at discharge.  Will educate on insulin pen on 01/31/20.   Will order insulin starter kit once patient is in a room and out of ED.  Will continue to follow while inpatient.  Thank you, Reche Dixon, RN, BSN Diabetes Coordinator Inpatient Diabetes Program [phone] (team pager from 8a-5p)   Will continue to follow while inpatient.  Thank you, Reche Dixon, RN, BSN Diabetes Coordinator Inpatient Diabetes Program [phone] (team pager from 8a-5p)

## 2020-01-30 NOTE — ED Notes (Signed)
Pt on phone giving wife update.

## 2020-01-30 NOTE — ED Notes (Addendum)
Dextrose bag initiated at 62.5mg /hr with LR running at 62.29ml/hr to mimick D5% in LR infusion. Will holf normal LR drip at this time. This RN spoke to Shanon Brow in pharmacy to verify rates.

## 2020-01-31 ENCOUNTER — Other Ambulatory Visit: Payer: Self-pay | Admitting: Family Medicine

## 2020-01-31 LAB — GLUCOSE, CAPILLARY
Glucose-Capillary: 152 mg/dL — ABNORMAL HIGH (ref 70–99)
Glucose-Capillary: 154 mg/dL — ABNORMAL HIGH (ref 70–99)
Glucose-Capillary: 191 mg/dL — ABNORMAL HIGH (ref 70–99)
Glucose-Capillary: 143 mg/dL — ABNORMAL HIGH (ref 70–99)

## 2020-01-31 LAB — BASIC METABOLIC PANEL
Anion gap: 15 (ref 5–15)
BUN: <5 mg/dL — ABNORMAL LOW (ref 6–20)
CO2: 21 mmol/L — ABNORMAL LOW (ref 22–32)
Calcium: 8.8 mg/dL — ABNORMAL LOW (ref 8.9–10.3)
Chloride: 100 mmol/L (ref 98–111)
Creatinine, Ser: 0.67 mg/dL (ref 0.61–1.24)
GFR calc Af Amer: >60 mL/min (ref >60)
GFR calc non Af Amer: >60 mL/min (ref >60)
Glucose, Bld: 185 mg/dL — ABNORMAL HIGH (ref 70–99)
Potassium: 3.2 mmol/L — ABNORMAL LOW (ref 3.5–5.1)
Sodium: 136 mmol/L (ref 135–145)

## 2020-01-31 LAB — BETA-HYDROXYBUTYRIC ACID: Beta-Hydroxybutyric Acid: 3.82 mmol/L — ABNORMAL HIGH (ref 0.05–0.27)

## 2020-01-31 LAB — MAGNESIUM: Magnesium: 1.4 mg/dL — ABNORMAL LOW (ref 1.7–2.4)

## 2020-01-31 MED ORDER — LAMOTRIGINE 25 MG PO TABS
150.0000 mg | ORAL_TABLET | Freq: Every day | ORAL | Status: DC
  Administered 2020-01-31: 150 mg via ORAL
  Filled 2020-01-31: qty 1

## 2020-01-31 MED ORDER — BLOOD GLUCOSE MONITOR KIT: PACK | 0 refills | Status: AC

## 2020-01-31 MED ORDER — POTASSIUM CHLORIDE CRYS ER 20 MEQ PO TBCR
40.0000 meq | EXTENDED_RELEASE_TABLET | Freq: Once | ORAL | Status: AC
  Administered 2020-01-31: 40 meq via ORAL
  Filled 2020-01-31: qty 2

## 2020-01-31 MED ORDER — INSULIN PEN NEEDLE 32G X 4 MM MISC: 100.0000 | Freq: Four times a day (QID) | 0 refills | Status: DC

## 2020-01-31 MED ORDER — INSULIN GLARGINE 100 UNIT/ML SOLOSTAR PEN
15.0000 [IU] | PEN_INJECTOR | Freq: Every day | SUBCUTANEOUS | 0 refills | Status: DC
Start: 2020-01-31 — End: 2020-02-01

## 2020-01-31 MED ORDER — MAGNESIUM OXIDE 400 (241.3 MG) MG PO TABS
800.0000 mg | ORAL_TABLET | Freq: Every day | ORAL | Status: DC
  Administered 2020-01-31: 800 mg via ORAL
  Filled 2020-01-31: qty 2

## 2020-01-31 MED ORDER — POTASSIUM CHLORIDE CRYS ER 20 MEQ PO TBCR: 40.0000 meq | EXTENDED_RELEASE_TABLET | Freq: Every day | ORAL | 0 refills | Status: DC

## 2020-01-31 MED ORDER — OLANZAPINE 5 MG PO TABS: 5.0000 mg | ORAL_TABLET | Freq: Every day | ORAL | Status: DC

## 2020-01-31 MED ORDER — NOVOLOG FLEXPEN 100 UNIT/ML ~~LOC~~ SOPN: 3.0000 [IU] | PEN_INJECTOR | Freq: Three times a day (TID) | SUBCUTANEOUS | 0 refills | Status: DC

## 2020-01-31 MED ORDER — VILAZODONE HCL 40 MG PO TABS
40.0000 mg | ORAL_TABLET | Freq: Every day | ORAL | Status: DC
  Filled 2020-01-31: qty 1

## 2020-01-31 MED ORDER — MAGNESIUM OXIDE 400 (241.3 MG) MG PO TABS: 800.0000 mg | ORAL_TABLET | Freq: Every day | ORAL | 0 refills | Status: AC

## 2020-01-31 NOTE — Plan of Care (Signed)
Patient IV x3 were removed.  Offered HH RN to follow up in home - but patient stated, "I do  Not need/want HH at this time.  Attempted to set up an endocrinologist visit but stated, "we'll just FU with my PCP (Dr.Fisher) and she can refer [place] to one."  DC papers given, explained and educated.  Scripts sent to pharm with insulin kits and pens.  Discussed importance of taking BS x4/day and keeping a consistent log - Patient agreed.  Talked have hypo/hyper and DKA s/sx and when to call for help. All education completed with wife in the room.  Once ready wheeled to front and wife taking home via car.

## 2020-01-31 NOTE — Progress Notes (Addendum)
Inpatient Diabetes Program Recommendations  AACE/ADA: New Consensus Statement on Inpatient Glycemic Control (2015)  Target Ranges:  Prepandial:   less than 140 mg/dL      Peak postprandial:   less than 180 mg/dL (1-2 hours)      Critically ill patients:  140 - 180 mg/dL   Results for Raymond Rose, Raymond Rose (MRN [id number]) as of 01/31/2020 07:44  Ref. Range 01/30/2020 10:36 01/30/2020 11:28 01/30/2020 12:29 01/30/2020 13:28 01/30/2020 14:31 01/30/2020 15:28 01/30/2020 16:56  Glucose-Capillary Latest Ref Range: 70 - 99 mg/dL 142 (H)  IV Insulin Drip 133 (H)  IV Insulin Drip 137 (H)  IV Insulin Drip 145 (H)  IV Insulin Drip 142 (H)  IV Insulin Drip 152 (H)  IV Insulin Drip  15 units LANTUS given 147 (H)  IV Insulin Drip OFF   Results for Raymond Rose, Raymond Rose (MRN [id number]) as of 01/31/2020 07:44  Ref. Range 01/31/2020 03:59 01/31/2020 07:40  Glucose-Capillary Latest Ref Range: 70 - 99 mg/dL 152 (H) 154 (H)    Diabetes history: DM2  Outpatient Diabetes medications: Trulicity 1.5 mg weekly Metformin 1000 BID (hasn't been taking) Farxiga 10 mg daily Glipizide 10 mg QAM  Current orders: Lantus 15 units Daily     Novolog Moderate Correction Scale/ SSI (0-15 units) TID AC + HS     Novolog 3 units TID    MD- Given pt has been admitted for DKA twice within 6 days, likely needs Insulin for home.  Please consider stopping pt's current home meds (especially the Metformin and Trulicity since pt admits to chronic nausea and vomiting at home)  Please give pt Rxs for Insulin pens for home: Lantus Insulin Pen- Order # [id number] Novolog Insulin Pen- Order # [id number] Insulin Pen Needles- Order # [id number]   Diabetes Coordinator had lengthy conversation with pt on 08/09 during admission last week.  Pt told the Diabetes Coordinator he has been having lots of nausea and vomiting at home.  Pt stated he stopped his Metformin to see if this would help with the nausea.  Diabetes Coordinator discussed with pt  that the Trulicity could also be contributing to these issues and recommended to pt that he discuss further with his PCP if he should continue Trulicity at home.    Addendum 11:30am--Met w/ pt and wife down in the ED.  Discussed with patient and wife diagnosis of DKA (pathophysiology), treatment of DKA, lab results, and transition plan to SQ insulin regimen.  Discussed w/ pt and wife that since pt has been admitted with DKA 2 times in the last 6 days that he likely needs insulin for home.  Needs close follow up with his PCP and strongly encouraged pt to seek care under an Endocrinologist for further diabetes care.  Discussed w/ pt and wife that pt may not be making sufficient endogenous insulin and that he needs insulin at home to keep him out of DKA.  Wife asked me how someone with Type 2 diabetes could get DKA (she stated she thought DKA only happened to Type 1 patients).  Explained to both that DKA can happen to someone with Type 1 or Type 2 diabetes and that if a patient is not making sufficient insulin on his own that he will need insulin to prevent further episodes of DKA.  We discussed how patients with Type 2 can lose insulin production over time and I encouraged pt to ask his PCP (or ENDO if he gets established with one) to request a c-peptide to assess his  pancreatic insulin production.  Reviewed both Lantus and Novolog insulins and how they work, when to take, how to take, etc.  Also reviewed signs and symptoms of Hypoglycemia and how to treat HYPO at home.  Educated patient and spouse on insulin pen use at home.  Reviewed all steps of insulin pen including attachment of needle, 2-unit air shot, dialing up dose, giving injection, rotation of injection sites, removing needle, disposal of sharps, storage of unused insulin, disposal of insulin etc.  Patient able to provide successful return demonstration.  Reviewed troubleshooting with insulin pen. Have asked RNs caring for patient to please allow  patient to give all injections here in hospital as much as possible for practice.  MD to give patient Rxs for insulin pens and insulin pen needles.        --Will follow patient during hospitalization--  Wyn Quaker RN, MSN, CDE Diabetes Coordinator Inpatient Glycemic Control Team Team Pager: [phone] (8a-5p)

## 2020-01-31 NOTE — Discharge Instructions (Signed)
Diabetes Basics  Diabetes (diabetes mellitus) is a long-term (chronic) disease. It occurs when the body does not properly use sugar (glucose) that is released from food after you eat. Diabetes may be caused by one or both of these problems:  Your pancreas does not make enough of a hormone called insulin.  Your body does not react in a normal way to insulin that it makes. Insulin lets sugars (glucose) go into cells in your body. This gives you energy. If you have diabetes, sugars cannot get into cells. This causes high blood sugar (hyperglycemia). Follow these instructions at home: How is diabetes treated? You may need to take insulin or other diabetes medicines daily to keep your blood sugar in balance. Take your diabetes medicines every day as told by your doctor. List your diabetes medicines here: Diabetes medicines  Name of medicine: ______________________________ ? Amount (dose): _______________ Time (a.m./p.m.): _______________ Notes: ___________________________________  Name of medicine: ______________________________ ? Amount (dose): _______________ Time (a.m./p.m.): _______________ Notes: ___________________________________  Name of medicine: ______________________________ ? Amount (dose): _______________ Time (a.m./p.m.): _______________ Notes: ___________________________________ If you use insulin, you will learn how to give yourself insulin by injection. You may need to adjust the amount based on the food that you eat. List the types of insulin you use here: Insulin  Insulin type: ______________________________ ? Amount (dose): _______________ Time (a.m./p.m.): _______________ Notes: ___________________________________  Insulin type: ______________________________ ? Amount (dose): _______________ Time (a.m./p.m.): _______________ Notes: ___________________________________  Insulin type: ______________________________ ? Amount (dose): _______________ Time (a.m./p.m.):  _______________ Notes: ___________________________________  Insulin type: ______________________________ ? Amount (dose): _______________ Time (a.m./p.m.): _______________ Notes: ___________________________________  Insulin type: ______________________________ ? Amount (dose): _______________ Time (a.m./p.m.): _______________ Notes: ___________________________________ How do I manage my blood sugar?  Check your blood sugar levels using a blood glucose monitor as directed by your doctor. Your doctor will set treatment goals for you. Generally, you should have these blood sugar levels:  Before meals (preprandial): 80-130 mg/dL (4.4-7.2 mmol/L).  After meals (postprandial): below 180 mg/dL (10 mmol/L).  A1c level: less than 7%. Write down the times that you will check your blood sugar levels: Blood sugar checks  Time: _______________ Notes: ___________________________________  Time: _______________ Notes: ___________________________________  Time: _______________ Notes: ___________________________________  Time: _______________ Notes: ___________________________________  Time: _______________ Notes: ___________________________________  Time: _______________ Notes: ___________________________________  What do I need to know about low blood sugar? Low blood sugar is called hypoglycemia. This is when blood sugar is at or below 70 mg/dL (3.9 mmol/L). Symptoms may include:  Feeling: ? Hungry. ? Worried or nervous (anxious). ? Sweaty and clammy. ? Confused. ? Dizzy. ? Sleepy. ? Sick to your stomach (nauseous).  Having: ? A fast heartbeat. ? A headache. ? A change in your vision. ? Tingling or no feeling (numbness) around the mouth, lips, or tongue. ? Jerky movements that you cannot control (seizure).  Having trouble with: ? Moving (coordination). ? Sleeping. ? Passing out (fainting). ? Getting upset easily (irritability). Treating low blood sugar To treat low blood  sugar, eat or drink something sugary right away. If you can think clearly and swallow safely, follow the 15:15 rule:  Take 15 grams of a fast-acting carb (carbohydrate). Talk with your doctor about how much you should take.  Some fast-acting carbs are: ? Sugar tablets (glucose pills). Take 3-4 glucose pills. ? 6-8 pieces of hard candy. ? 4-6 oz (120-150 mL) of fruit juice. ? 4-6 oz (120-150 mL) of regular (not diet) soda. ? 1 Tbsp (15 mL) honey or sugar.    Check your blood sugar 15 minutes after you take the carb.  If your blood sugar is still at or below 70 mg/dL (3.9 mmol/L), take 15 grams of a carb again.  If your blood sugar does not go above 70 mg/dL (3.9 mmol/L) after 3 tries, get help right away.  After your blood sugar goes back to normal, eat a meal or a snack within 1 hour. Treating very low blood sugar If your blood sugar is at or below 54 mg/dL (3 mmol/L), you have very low blood sugar (severe hypoglycemia). This is an emergency. Do not wait to see if the symptoms will go away. Get medical help right away. Call your local emergency services (911 in the [place] Do not drive yourself to the hospital. Questions to ask your health care provider  Do I need to meet with a diabetes educator?  What equipment will I need to care for myself at home?  What diabetes medicines do I need? When should I take them?  How often do I need to check my blood sugar?  What number can I call if I have questions?  When is my next doctor's visit?  Where can I find a support group for people with diabetes? Where to find more information  American Diabetes Association: www.diabetes.org  American Association of Diabetes Educators: www.diabeteseducator.org/patient-resources Contact a doctor if:  Your blood sugar is at or above 240 mg/dL (13.3 mmol/L) for 2 days in a row.  You have been sick or have had a fever for 2 days or more, and you are not getting better.  You have any of these  problems for more than 6 hours: ? You cannot eat or drink. ? You feel sick to your stomach (nauseous). ? You throw up (vomit). ? You have watery poop (diarrhea). Get help right away if:  Your blood sugar is lower than 54 mg/dL (3 mmol/L).  You get confused.  You have trouble: ? Thinking clearly. ? Breathing. Summary  Diabetes (diabetes mellitus) is a long-term (chronic) disease. It occurs when the body does not properly use sugar (glucose) that is released from food after digestion.  Take insulin and diabetes medicines as told.  Check your blood sugar every day, as often as told.  Keep all follow-up visits as told by your doctor. This is important. This information is not intended to replace advice given to you by your health care provider. Make sure you discuss any questions you have with your health care provider. Document Revised: 02/24/2019 Document Reviewed: 09/05/2017 Elsevier Patient Education  [place]. Diabetic Ketoacidosis Diabetic ketoacidosis is a serious complication of diabetes. This condition develops when there is not enough insulin in the body. Insulin is an hormone that regulates blood sugar levels in the body. Normally, insulin allows glucose to enter the cells in the body. The cells break down glucose for energy. Without enough insulin, the body cannot break down glucose, so it breaks down fats instead. This leads to high blood glucose levels in the body and the production of acids that are called ketones. Ketones are poisonous at high levels. If diabetic ketoacidosis is not treated, it can cause severe dehydration and can lead to a coma or death. What are the causes? This condition develops when a lack of insulin causes the body to break down fats instead of glucose. This may be triggered by:  Stress on the body. This stress is brought on by an illness.  Infection.  Medicines that raise blood glucose  levels.  Not taking diabetes medicine.  New  onset of type 1 diabetes mellitus. What are the signs or symptoms? Symptoms of this condition include:  Fatigue.  Weight loss.  Excessive thirst.  Light-headedness.  Fruity or sweet-smelling breath.  Excessive urination.  Vision changes.  Confusion or irritability.  Nausea.  Vomiting.  Rapid breathing.  Abdominal pain.  Feeling flushed. How is this diagnosed? This condition is diagnosed based on your medical history, a physical exam, and blood tests. You may also have a urine test to check for ketones. How is this treated? This condition may be treated with:  Fluid replacement. This may be done to correct dehydration.  Insulin injections. These may be given through the skin or through an IV tube.  Electrolyte replacement. Electrolytes are minerals in your blood. Electrolytes such as potassium and sodium may be given in pill form or through an IV tube.  Antibiotic medicines. These may be prescribed if your condition was caused by an infection. Diabetic ketoacidosis is a serious medical condition. You may need emergency treatment in the hospital to monitor your condition. Follow these instructions at home: Eating and drinking  Drink enough fluids to keep your urine clear or pale yellow.  If you are not able to eat, drink clear fluids in small amounts as you are able. Clear fluids include water, ice chips, fruit juice with water added (diluted), and low-calorie sports drinks. You may also have sugar-free jello or popsicles.  If you are able to eat, follow your usual diet and drink sugar-free liquids, such as water. Medicines  Take over-the-counter and prescription medicines only as told by your health care provider.  Continue to take insulin and other diabetes medicines as told by your health care provider.  If you were prescribed an antibiotic, take it as told by your health care provider. Do not stop taking the antibiotic even if you start to feel  better. General instructions   Check your urine for ketones when you are ill and as told by your health care provider. ? If your blood glucose is 240 mg/dL (13.3 mmol/L) or higher, check your urine ketones every 4-6 hours.  Check your blood glucose every day, as often as told by your health care provider. ? If your blood glucose is high, drink plenty of fluids. This helps to flush out ketones. ? If your blood glucose is above your target for 2 tests in a row, contact your health care provider.  Carry a medical alert card or wear medical alert jewelry that says that you have diabetes.  Rest and exercise only as told by your health care provider. Do not exercise when your blood glucose is high and you have ketones in your urine.  If you get sick, call your health care provider and begin treatment quickly. Your body often needs extra insulin to fight an illness. Check your blood glucose every 4-6 hours when you are sick.  Keep all follow-up visits as told by your health care provider. This is important. Contact a health care provider if:  Your blood glucose level is higher than 240 mg/dL (13.3 mmol/L) for 2 days in a row.  You have moderate or large ketones in your urine.  You have a fever.  You cannot eat or drink without vomiting.  You have been vomiting for more than 2 hours.  You continue to have symptoms of diabetic ketoacidosis.  You develop new symptoms. Get help right away if:  Your blood glucose monitor reads "  high" even when you are taking insulin.  You faint.  You have chest pain.  You have trouble breathing.  You have sudden trouble speaking or swallowing.  You have vomiting or diarrhea that gets worse after 3 hours.  You are unable to stay awake.  You have trouble thinking.  You are severely dehydrated. Symptoms of severe dehydration include: ? Extreme thirst. ? Dry mouth. ? Rapid breathing. These symptoms may represent a serious problem that is an  emergency. Do not wait to see if the symptoms will go away. Get medical help right away. Call your local emergency services (911 in the [place] Do not drive yourself to the hospital. Summary  Diabetic ketoacidosis is a serious complication of diabetes. This condition develops when there is not enough insulin in the body.  This condition is diagnosed based on your medical history, a physical exam, and blood tests. You may also have a urine test to check for ketones.  Diabetic ketoacidosis is a serious medical condition. You may need emergency treatment in the hospital to monitor your condition.  Contact your health care provider if your blood glucose is higher than 240 mg/dl for 2 days in a row or if you have moderate or large ketones in your urine. This information is not intended to replace advice given to you by your health care provider. Make sure you discuss any questions you have with your health care provider. Document Revised: 07/19/2016 Document Reviewed: 07/08/2016 Elsevier Patient Education  2020 [employer].

## 2020-01-31 NOTE — Discharge Summary (Signed)
Discharge Summary  Raymond Rose FWY:[id number] DOB: November 29, 1975  PCP: Birdie Sons, MD  Admit date: 01/29/2020 Discharge date: 01/31/2020  Time spent: 35 minutes  Recommendations for Outpatient Follow-up:  1. Follow-up with your primary care provider 2. Please obtain endocrinology referral from your primary care provider 3. Follow-up with your gastroenterologist 4. Take your medications as prescribed  Discharge Diagnoses:  Active Hospital Problems   Diagnosis Date Noted  . DKA, type 2 ([place]) 01/29/2020  . DKA (diabetic ketoacidoses) ([place]) 07/03/2019  . Obstructive sleep apnea 07/08/2010    Resolved Hospital Problems  No resolved problems to display.    Discharge Condition: Stable  Diet recommendation: Carb modified diet  Vitals:   01/31/20 0401 01/31/20 0926  BP: 137/90 (!) 146/98  Pulse: 83 100  Resp: 17 16  Temp:  97.9 F (36.6 C)  SpO2: 98% 97%    History of present illness:  [name] a 44 y.o.malewith medical history significant for obesity,DM 2, OSA on CPAP, chronic pain, depression and anxiety, hospitalized from 8/8-8/10 with DKA presenting with nausea and vomiting to the emergency room with recurrent symptoms. States he was fine when he went home but symptoms started up again 2 days prior to presentation, nausea, vomiting and associated epigastric discomfort.  Lipase level normal.  States he follows up with GI.  ED Course:On arrival, he was tachycardic at 130, elevated BP 171/109. Blood work significant for leukocytosis of 13,000 but with normal lactic acid. Blood sugar 232 with anion gap of 25, serum bicarb 9, beta hydroxybutyrate acid greater than 8. Lipase normal at 42. He was started oninsulin drip then transitioned to subcu insulin.  Diabetes coordinator assisted with the management of his diabetes.    01/31/20:Seen and examined at his bedside in the ED.  He wants to go home.   States he will follow-up with his  gastroenterologist outpatient regarding his epigastric pain.  No overt bleeding.  Tolerating a diet.  Vital signs and lab studies are stable.   Hospital Course:  Active Problems:   Obstructive sleep apnea   DKA (diabetic ketoacidoses) ([place])   DKA, type 2 ([place])  Resolved DKA, type II. -Presents with nausea and vomiting similar to recent past presentation for DKA with anion gap of 25, serum bicarb of 9, blood sugar 232 Received and completed insulin drip, transitioned to subcu insulin Continue Lantus 15 units and NovoLog 3 units 3 times daily Follow-up with your PCP or endocrinology Hemoglobin A1c 10.1 on 01/23/2020 Appreciate diabetes coordinator assistance Repeat BMP on Friday in the clinic 02/04/2020.  Resolving anion gap metabolic acidosis in the setting of DKA Last serum bicarb 21, presented with a serum bicarb of 9.  Epigastric pain, suspect chronic States previously seen by GI  Continue home PPI.   Serum lipase normal  Refractory hypokalemia Serum potassium 2.7>3.2 Repleted orally Continue potassium supplement 40 mEq daily x3 days Follow-up with your PCP  Hypomagnesemia Serum magnesium 1.4 Repleted orally Continue magnesium oxide 800 mg daily x3 days Follow-up with your PCP  Obstructive sleep apnea -CPAP  Anxiety and depression Continue home regimen  Chronic pain Continue home regimen    Discharge Exam: BP (!) 146/98 (BP Location: Left Arm)   Pulse 100   Temp 97.9 F (36.6 C) (Oral)   Resp 16   Ht '6\' 1"'$  (1.854 m)   Wt 136.1 kg   SpO2 97%   BMI 39.58 kg/m  . General: 44 y.o. year-old male well developed well nourished in no acute  distress.  Alert and oriented x3. . Cardiovascular: Regular rate and rhythm with no rubs or gallops.  No thyromegaly or JVD noted.   Marland Kitchen Respiratory: Clear to auscultation with no wheezes or rales. Good inspiratory effort. . Abdomen: Soft nontender nondistended with normal bowel sounds x4 quadrants. . Musculoskeletal:  No lower extremity edema. 2/4 pulses in all 4 extremities. Marland Kitchen Psychiatry: Mood is appropriate for condition and setting  Discharge Instructions You were cared for by a hospitalist during your hospital stay. If you have any questions about your discharge medications or the care you received while you were in the hospital after you are discharged, you can call the unit and asked to speak with the hospitalist on call if the hospitalist that took care of you is not available. Once you are discharged, your primary care physician will handle any further medical issues. Please note that NO REFILLS for any discharge medications will be authorized once you are discharged, as it is imperative that you return to your primary care physician (or establish a relationship with a primary care physician if you do not have one) for your aftercare needs so that they can reassess your need for medications and monitor your lab values.   Allergies as of 01/31/2020      Reactions   Bupropion    Extreme anxiety   Morphine       Medication List    STOP taking these medications   Farxiga 10 MG Tabs tablet Generic drug: dapagliflozin propanediol   glipiZIDE 10 MG 24 hr tablet Commonly known as: GLUCOTROL XL   metFORMIN 500 MG 24 hr tablet Commonly known as: GLUCOPHAGE-XR   Trulicity 1.5 MG/0.5ML Sopn Generic drug: Dulaglutide     TAKE these medications   ALPRAZolam 0.5 MG tablet Commonly known as: XANAX TAKE 1 TABLET (0.5 MG TOTAL) BY MOUTH EVERY 4 (FOUR) HOURS AS NEEDED. FOR ANXIETY   blood glucose meter kit and supplies Kit Dispense based on patient and insurance preference. Use up to four times daily as directed. (FOR ICD-9 250.00, 250.01).   insulin glargine 100 UNIT/ML Solostar Pen Commonly known as: LANTUS Inject 15 Units into the skin daily.   Insulin Pen Needle 32G X 4 MM Misc 100 applicators by Does not apply route 4 (four) times daily.   ketoconazole 2 % cream Commonly known as:  NIZORAL APPLY TO AFFECTED AREA ONCE DAILY FOR 7 DAYS AS NEEDED What changed: See the new instructions.   lamoTRIgine 150 MG tablet Commonly known as: LaMICtal Take 1 tablet (150 mg total) by mouth daily.   magnesium oxide 400 (241.3 Mg) MG tablet Commonly known as: MAG-OX Take 2 tablets (800 mg total) by mouth daily for 3 days.   NovoLOG FlexPen 100 UNIT/ML FlexPen Generic drug: insulin aspart Inject 3 Units into the skin 3 (three) times daily with meals.   OLANZapine 5 MG tablet Commonly known as: ZYPREXA Take 5 mg by mouth at bedtime.   omeprazole 40 MG capsule Commonly known as: PRILOSEC TAKE 1 CAPSULE (40 MG TOTAL) BY MOUTH DAILY.   ondansetron 4 MG tablet Commonly known as: Zofran Take 1 tablet (4 mg total) by mouth daily as needed for nausea or vomiting.   oxyCODONE-acetaminophen 7.5-325 MG tablet Commonly known as: PERCOCET Take 1 tablet by mouth every 4 (four) hours as needed.   Phosphorus w/Sod & Potassium 280-160-250 MG Pack Take 1 Package by mouth 3 (three) times daily after meals for 14 days.   potassium chloride SA 20 MEQ  tablet Commonly known as: KLOR-CON Take 2 tablets (40 mEq total) by mouth daily for 3 days.   promethazine 25 MG tablet Commonly known as: PHENERGAN TAKE 1 TABLET (25 MG TOTAL) BY MOUTH EVERY 6 (SIX) HOURS AS NEEDED FOR NAUSEA OR VOMITING.   rosuvastatin 20 MG tablet Commonly known as: CRESTOR TAKE 1 TABLET BY MOUTH EVERY DAY   Vilazodone HCl 40 MG Tabs Commonly known as: VIIBRYD Take 1 tablet (40 mg total) by mouth daily.      Allergies  Allergen Reactions  . Bupropion     Extreme anxiety  . Morphine     Follow-up Information    Malva Limes, MD. Call in 1 day(s).   Specialty: Family Medicine Why: Please call for a post hospital follow-up appointment. Contact information: [address] [place] [address] [phone]                The results of significant diagnostics from this  hospitalization (including imaging, microbiology, ancillary and laboratory) are listed below for reference.    Significant Diagnostic Studies: DG Chest Port 1 View  Result Date: 01/29/2020 CLINICAL DATA:  Vomiting. Recent hospital admission and discharge for DKA. Weakness and shortness of breath. EXAM: PORTABLE CHEST 1 VIEW COMPARISON:  07/03/2019 FINDINGS: The cardiomediastinal contours are normal. The lungs are clear. Pulmonary vasculature is normal. No consolidation, pleural effusion, or pneumothorax. No acute osseous abnormalities are seen. IMPRESSION: Negative AP view of the chest. Electronically Signed   By: Narda Rutherford M.D.   On: 01/29/2020 01:10    Microbiology: Recent Results (from the past 240 hour(s))  SARS Coronavirus 2 by RT PCR (hospital order, performed in [facility] hospital lab) Nasopharyngeal Nasopharyngeal Swab     Status: None   Collection Time: 01/23/20  3:53 PM   Specimen: Nasopharyngeal Swab  Result Value Ref Range Status   SARS Coronavirus 2 NEGATIVE NEGATIVE Final    Comment: (NOTE) SARS-CoV-2 target nucleic acids are NOT DETECTED.  The SARS-CoV-2 RNA is generally detectable in upper and lower respiratory specimens during the acute phase of infection. The lowest concentration of SARS-CoV-2 viral copies this assay can detect is 250 copies / mL. A negative result does not preclude SARS-CoV-2 infection and should not be used as the sole basis for treatment or other patient management decisions.  A negative result may occur with improper specimen collection / handling, submission of specimen other than nasopharyngeal swab, presence of viral mutation(s) within the areas targeted by this assay, and inadequate number of viral copies (<250 copies / mL). A negative result must be combined with clinical observations, patient history, and epidemiological information.  Fact Sheet for Patients:   BoilerBrush.com.cy  Fact Sheet for Healthcare  Providers: https://pope.com/  This test is not yet approved or  cleared by the [place] FDA and has been authorized for detection and/or diagnosis of SARS-CoV-2 by FDA under an Emergency Use Authorization (EUA).  This EUA will remain in effect (meaning this test can be used) for the duration of the COVID-19 declaration under Section 564(b)(1) of the Act, 21 U.S.C. section 360bbb-3(b)(1), unless the authorization is terminated or revoked sooner.  Performed at [facility], [address] [place], [place] [address]   SARS Coronavirus 2 by RT PCR (hospital order, performed in [facility] hospital lab) Nasopharyngeal Nasopharyngeal Swab     Status: None   Collection Time: 01/29/20  1:00 AM   Specimen: Nasopharyngeal Swab  Result Value Ref Range Status   SARS  Coronavirus 2 NEGATIVE NEGATIVE Final    Comment: (NOTE) SARS-CoV-2 target nucleic acids are NOT DETECTED.  The SARS-CoV-2 RNA is generally detectable in upper and lower respiratory specimens during the acute phase of infection. The lowest concentration of SARS-CoV-2 viral copies this assay can detect is 250 copies / mL. A negative result does not preclude SARS-CoV-2 infection and should not be used as the sole basis for treatment or other patient management decisions.  A negative result may occur with improper specimen collection / handling, submission of specimen other than nasopharyngeal swab, presence of viral mutation(s) within the areas targeted by this assay, and inadequate number of viral copies (<250 copies / mL). A negative result must be combined with clinical observations, patient history, and epidemiological information.  Fact Sheet for Patients:   StrictlyIdeas.no  Fact Sheet for Healthcare Providers: BankingDealers.co.za  This test is not yet approved or  cleared by the [place] FDA and has been authorized for detection  and/or diagnosis of SARS-CoV-2 by FDA under an Emergency Use Authorization (EUA).  This EUA will remain in effect (meaning this test can be used) for the duration of the COVID-19 declaration under Section 564(b)(1) of the Act, 21 U.S.C. section 360bbb-3(b)(1), unless the authorization is terminated or revoked sooner.  Performed at [facility], [address] [place], [place] [address]      Labs: Basic Metabolic Panel: Recent Labs  Lab 01/25/20 0413 01/29/20 0100 01/29/20 2206 01/30/20 0303 01/30/20 0639 01/30/20 0925 01/31/20 1224  NA 138   < > 139 139 139 138 136  K 3.5   < > 3.4* 3.0* 2.7* 3.4* 3.2*  CL 107   < > 106 107 106 105 100  CO2 19*   < > 15* 20* 18* 19* 21*  GLUCOSE 176*   < > 163* 161* 146* 158* 185*  BUN 11   < > 5* <5* <5* <5* <5*  CREATININE 0.79   < > 0.73 0.65 0.86 0.75 0.67  CALCIUM 8.4*   < > 8.8* 8.3* 8.6* 8.6* 8.8*  MG 2.0  --   --   --   --   --  1.4*  PHOS 1.9*  --   --   --   --   --   --    < > = values in this interval not displayed.   Liver Function Tests: Recent Labs  Lab 01/29/20 0100  AST 18  ALT 18  ALKPHOS 102  BILITOT 2.1*  PROT 9.3*  ALBUMIN 5.0   Recent Labs  Lab 01/29/20 0100  LIPASE 42   No results for input(s): AMMONIA in the last 168 hours. CBC: Recent Labs  Lab 01/25/20 0413 01/29/20 0101 01/30/20 0303  WBC 6.9 13.8* 6.8  NEUTROABS 3.4 10.7* 2.9  HGB 13.2 17.9* 14.0  HCT 39.2 52.5* 41.8  MCV 86.9 86.5 87.6  PLT 187 321 187   Cardiac Enzymes: No results for input(s): CKTOTAL, CKMB, CKMBINDEX, TROPONINI in the last 168 hours. BNP: BNP (last 3 results) No results for input(s): BNP in the last 8760 hours.  ProBNP (last 3 results) No results for input(s): PROBNP in the last 8760 hours.  CBG: Recent Labs  Lab 01/30/20 1528 01/30/20 1656 01/31/20 0359 01/31/20 0740 01/31/20 1138  GLUCAP 152* 147* 152* 154* 191*       Signed:  Kayleen Memos, MD [facility] 01/31/2020, 2:44  PM

## 2020-02-01 ENCOUNTER — Inpatient Hospital Stay: Payer: 59 | Admitting: Physician Assistant

## 2020-02-01 ENCOUNTER — Other Ambulatory Visit: Payer: Self-pay | Admitting: Family Medicine

## 2020-02-01 ENCOUNTER — Telehealth: Payer: Self-pay | Admitting: Family Medicine

## 2020-02-01 DIAGNOSIS — E1110 Type 2 diabetes mellitus with ketoacidosis without coma: Secondary | ICD-10-CM

## 2020-02-01 MED ORDER — BASAGLAR KWIKPEN 100 UNIT/ML ~~LOC~~ SOPN: 15.0000 [IU] | PEN_INJECTOR | Freq: Every day | SUBCUTANEOUS | 1 refills | Status: DC

## 2020-02-01 MED ORDER — OXYCODONE-ACETAMINOPHEN 7.5-325 MG PO TABS: 1.0000 | ORAL_TABLET | ORAL | 0 refills | Status: DC | PRN

## 2020-02-01 NOTE — Telephone Encounter (Signed)
Pt's' wife called back at 1:00 pm asking if his insulin has been called in yet to the pharmacy.  She said he was just released from the hospital yesterday but he rx they ordered was not covered .  His blood glucose is 250 today and he has not had any insulin since he was in the hosptal.  She just wants to make sure it is ordered asap.

## 2020-02-01 NOTE — Telephone Encounter (Signed)
Patient wife called stating patient was discharged from hospital and ER doctor prescribed patient lancets insulin, patient insurance does not cover medication.  Pharmacy told patient to call doctors office and ask for basaglar that his insurance covers.    Patient would like medication call in today to the pharmacy  [id number] - [place], [place] Phone:  [phone]  Fax:  [phone]     Patient call back [phone]

## 2020-02-01 NOTE — Telephone Encounter (Signed)
Sent in Basaglar.

## 2020-02-02 ENCOUNTER — Ambulatory Visit: Payer: Self-pay | Admitting: *Deleted

## 2020-02-02 ENCOUNTER — Telehealth: Payer: Self-pay

## 2020-02-02 NOTE — Telephone Encounter (Signed)
  Patient's wife called to ask if patient taking levimir insulin 20 units is ok prescribed by attending ER from recent hospitalization due to insurance not covering basaglar insulin previously prescribed. Patient's wife reports patient continues to experience blurry vision, dizziness, headache today. Blood glucose today elevated : 1023- CBG 241, 12:09- 236, 2:47- 292, 3:12- 270. Patient's wife requesting PCP to call concerning insulin. patient denies numbness or weakness on one side.  appt scheduled for 02/08/20 Care advise given. Patient and patients  wife verbalized understanding of care advise and to go to ED if symptoms worsen. Reason for Disposition . [1] Caller has medicine question about med NOT prescribed by PCP AND [2] triager unable to answer question (e.g., compatibility with other med, storage)  Answer Assessment - Initial Assessment Questions 1. NAME of MEDICATION: "What medicine are you calling about?"     basaglar flex pen 2. QUESTION: "What is your question?" (e.g., medication refill, side effect)     Which insulin is patient supposed to take? 3. PRESCRIBING HCP: "Who prescribed it?" Reason: if prescribed by specialist, call should be referred to that group.     Attending ER MD switched patient to levimir insulin 20 units instead of basaglar due to no coverage from insurance. 4. SYMPTOMS: "Do you have any symptoms?"     Elevated blood glucose, blurry vision, dizziness, headache 5. SEVERITY: If symptoms are present, ask "Are they mild, moderate or severe?"     mild  Protocols used: MEDICATION QUESTION CALL-A-AH

## 2020-02-02 NOTE — Telephone Encounter (Signed)
Copied from [place] [id number]. Topic: Referral - Request for Referral >> Feb 02, 2020  1:34 PM Gillis Ends D wrote: Has patient seen PCP for this complaint? Yes.   *If NO, is insurance requiring patient see PCP for this issue before PCP can refer them? Referral for which specialty: Endocrinologist Preferred provider/office:Mellisa Solum [phone] (fax) [phone] (office) Reason for referral: Just hospitalized and they thin he needs to be seen now by a Endocrinologist

## 2020-02-03 ENCOUNTER — Ambulatory Visit: Payer: Self-pay | Admitting: Family Medicine

## 2020-02-03 LAB — HM DIABETES EYE EXAM

## 2020-02-03 NOTE — Telephone Encounter (Signed)
Called patient to advised of message and no answer, left voice message for patient to call back. If patient calls back kay for PEC to advise.

## 2020-02-03 NOTE — Telephone Encounter (Signed)
He called in yesterday asking about a referral. Dr. Caryn Section is out. Again, this is something we would address at [facility]. Endocrinology will not accept a referral without a note, which would be written at the [facility].

## 2020-02-03 NOTE — Telephone Encounter (Signed)
Please review. Thanks!  

## 2020-02-03 NOTE — Telephone Encounter (Signed)
Called patient and advised as below. He reports that his is taking Novolog 3 units and Levemir 20 units and his blood sugars were still close to 300. He is wanting to know the status of the endocrinology referral. He reports that Dr. Caryn Section placed an order but I dont see anything. I also told him that we can do his HFU tomorrow, but he wants to wait. Should we place an order for the referral? Please advise. Thanks!

## 2020-02-03 NOTE — Telephone Encounter (Signed)
Pt returned call, placed in triage que. When called pt back unable to reach, VM full.

## 2020-02-03 NOTE — Telephone Encounter (Signed)
Attempted to reach, VM full. Pt information is in pt calls. Please see those  encounters.

## 2020-02-03 NOTE — Telephone Encounter (Signed)
That is what they told me they did cover after not covering Lantus. If he has to use Levemir then use 15 units He has a hospital follow up on 02/08/2020. If he wants to be seen sooner schedule him tomorrow. All of these are questions we would address at [facility]

## 2020-02-04 ENCOUNTER — Ambulatory Visit: Payer: Self-pay

## 2020-02-04 NOTE — Telephone Encounter (Signed)
Pt.'s wife calling to report pt. Is having elevated blood glucose readings today. Fasting 262, after taking his insulin and eating 303. Has a headache. No other symptoms. Pt. Taking Novolog 3 units three times daily with meals. Pt. Did not not pick up Kara Mead, "because the hospital called in another long acting insulin." Taking Levemir 20 units daily at noon. Today will be his fourth day taking this.Please advise pt. Spoke with Atiya in the practice and will forward triage note.  Answer Assessment - Initial Assessment Questions 1. BLOOD GLUCOSE: "What is your blood glucose level?"      Fasting 262  After meds and breakfast  303 2. ONSET: "When did you check the blood glucose?"     Today 3. USUAL RANGE: "What is your glucose level usually?" (e.g., usual fasting morning value, usual evening value)     100 in the hospital 4. KETONES: "Do you check for ketones (urine or blood test strips)?" If yes, ask: "What does the test show now?"      No 5. TYPE 1 or 2:  "Do you know what type of diabetes you have?"  (e.g., Type 1, Type 2, Gestational; doesn't know)      Type2 6. INSULIN: "Do you take insulin?" "What type of insulin(s) do you use? What is the mode of delivery? (syringe, pen; injection or pump)?"      Yes 7. DIABETES PILLS: "Do you take any pills for your diabetes?" If yes, ask: "Have you missed taking any pills recently?"     No 8. OTHER SYMPTOMS: "Do you have any symptoms?" (e.g., fever, frequent urination, difficulty breathing, dizziness, weakness, vomiting)     Headache 9. PREGNANCY: "Is there any chance you are pregnant?" "When was your last menstrual period?"     n/a  Protocols used: DIABETES - HIGH BLOOD SUGAR-A-AH

## 2020-02-04 NOTE — Telephone Encounter (Signed)
I have stated multiple times that if they have these many questions they need to be scheduled earlier for a hospital follow up and I have offered them an earlier appointment on multiple occasions. They have declined.  I cannot keep answering numerous sporadic questions about his illness WITHOUT evaluation. I need more information and to do a comprehensive evaluation. I can't give accurate advice without doing this. Tell him to schedule his hospital follow up sooner as I have advised previously.

## 2020-02-04 NOTE — Telephone Encounter (Signed)
Have tried calling patient and no answer. Vmbox full and unable to leave message. Provider prefer to speak with pt in office to properly assess. Can reschedule for a sooner appt if patient is symptomatic or have concerns.

## 2020-02-06 ENCOUNTER — Other Ambulatory Visit: Payer: Self-pay

## 2020-02-06 ENCOUNTER — Emergency Department
Admission: EM | Admit: 2020-02-06 | Discharge: 2020-02-06 | Disposition: A | Discharge status: Home / Self Care | Payer: 59 | Attending: Emergency Medicine | Admitting: Emergency Medicine

## 2020-02-06 DIAGNOSIS — R739 Hyperglycemia, unspecified: Secondary | ICD-10-CM | POA: Diagnosis present

## 2020-02-06 DIAGNOSIS — Z8719 Personal history of other diseases of the digestive system: Secondary | ICD-10-CM | POA: Insufficient documentation

## 2020-02-06 DIAGNOSIS — R5381 Other malaise: Secondary | ICD-10-CM | POA: Diagnosis not present

## 2020-02-06 DIAGNOSIS — R110 Nausea: Secondary | ICD-10-CM | POA: Insufficient documentation

## 2020-02-06 DIAGNOSIS — R531 Weakness: Secondary | ICD-10-CM | POA: Diagnosis not present

## 2020-02-06 DIAGNOSIS — Z79899 Other long term (current) drug therapy: Secondary | ICD-10-CM | POA: Diagnosis not present

## 2020-02-06 DIAGNOSIS — R1010 Upper abdominal pain, unspecified: Secondary | ICD-10-CM | POA: Diagnosis not present

## 2020-02-06 DIAGNOSIS — E1165 Type 2 diabetes mellitus with hyperglycemia: Secondary | ICD-10-CM | POA: Insufficient documentation

## 2020-02-06 DIAGNOSIS — R1013 Epigastric pain: Secondary | ICD-10-CM | POA: Insufficient documentation

## 2020-02-06 DIAGNOSIS — Z87891 Personal history of nicotine dependence: Secondary | ICD-10-CM | POA: Diagnosis not present

## 2020-02-06 LAB — URINALYSIS, COMPLETE (UACMP) WITH MICROSCOPIC
Bacteria, UA: NONE SEEN
Bilirubin Urine: NEGATIVE
Glucose, UA: >=500 mg/dL — AB
Hgb urine dipstick: NEGATIVE
Ketones, ur: 80 mg/dL — AB
Leukocytes,Ua: NEGATIVE
Nitrite: NEGATIVE
Protein, ur: NEGATIVE mg/dL
Specific Gravity, Urine: 1.023 (ref 1.005–1.030)
Squamous Epithelial / HPF: NONE SEEN (ref 0–5)
pH: 5 (ref 5.0–8.0)

## 2020-02-06 LAB — COMPREHENSIVE METABOLIC PANEL
ALT: 25 U/L (ref 0–44)
AST: 23 U/L (ref 15–41)
Albumin: 4.1 g/dL (ref 3.5–5.0)
Alkaline Phosphatase: 67 U/L (ref 38–126)
Anion gap: 16 — ABNORMAL HIGH (ref 5–15)
BUN: 17 mg/dL (ref 6–20)
CO2: 26 mmol/L (ref 22–32)
Calcium: 9.1 mg/dL (ref 8.9–10.3)
Chloride: 95 mmol/L — ABNORMAL LOW (ref 98–111)
Creatinine, Ser: 0.79 mg/dL (ref 0.61–1.24)
GFR calc Af Amer: >60 mL/min (ref >60)
GFR calc non Af Amer: >60 mL/min (ref >60)
Glucose, Bld: 300 mg/dL — ABNORMAL HIGH (ref 70–99)
Potassium: 3.8 mmol/L (ref 3.5–5.1)
Sodium: 137 mmol/L (ref 135–145)
Total Bilirubin: 1.4 mg/dL — ABNORMAL HIGH (ref 0.3–1.2)
Total Protein: 7.1 g/dL (ref 6.5–8.1)

## 2020-02-06 LAB — CBC
HCT: 40.5 % (ref 39.0–52.0)
Hemoglobin: 13.6 g/dL (ref 13.0–17.0)
MCH: 29.6 pg (ref 26.0–34.0)
MCHC: 33.6 g/dL (ref 30.0–36.0)
MCV: 88 fL (ref 80.0–100.0)
Platelets: 185 10*3/uL (ref 150–400)
RBC: 4.6 MIL/uL (ref 4.22–5.81)
RDW: 14.3 % (ref 11.5–15.5)
WBC: 4.9 10*3/uL (ref 4.0–10.5)
nRBC: 0 % (ref 0.0–0.2)

## 2020-02-06 LAB — GLUCOSE, CAPILLARY
Glucose-Capillary: 284 mg/dL — ABNORMAL HIGH (ref 70–99)
Glucose-Capillary: 205 mg/dL — ABNORMAL HIGH (ref 70–99)

## 2020-02-06 LAB — LIPASE, BLOOD: Lipase: 31 U/L (ref 11–51)

## 2020-02-06 MED ORDER — SODIUM CHLORIDE 0.9 % IV BOLUS
1000.0000 mL | Freq: Once | INTRAVENOUS | Status: AC
  Administered 2020-02-06: 1000 mL via INTRAVENOUS

## 2020-02-06 MED ORDER — KETOROLAC TROMETHAMINE 30 MG/ML IJ SOLN
30.0000 mg | Freq: Once | INTRAMUSCULAR | Status: AC
  Administered 2020-02-06: 30 mg via INTRAMUSCULAR
  Filled 2020-02-06: qty 1

## 2020-02-06 MED ORDER — KETOROLAC TROMETHAMINE 30 MG/ML IJ SOLN: 15.0000 mg | Freq: Once | INTRAMUSCULAR | Status: DC

## 2020-02-06 NOTE — ED Provider Notes (Addendum)
[facility] Emergency Department Provider Note   ____________________________________________   First MD Initiated Contact with Patient 02/06/20 1642     (approximate)  I have reviewed the triage vital signs and the nursing notes.   HISTORY  Chief Complaint Hyperglycemia    HPI Raymond Rose is a 44 y.o. male with past medical history of type 2 diabetes, OSA, and chronic pain who presents to the ED complaining of hyperglycemia.  Patient reports that he was recently admitted for DKA and transition to insulin regimen from oral medications.  He states he has been compliant with both long and short acting insulin, but has not yet taken his long-acting insulin today.  He is concerned that his blood sugar levels have been running high over the past couple of days, varying from 200-300.  He started feeling malaised earlier today with some upper abdominal discomfort and nausea, which is chronic for him.  He denies any fevers, cough, chest pain, shortness of breath, vomiting, or diarrhea.  He states he does not feel as sick as he did the past 2 times he needed to be admitted for DKA.        Past Medical History:  Diagnosis Date   Anxiety    Arthritis    [id number]/0086   Cyclical vomiting    Depression    Diabetes mellitus, type II ([place])    OSA on CPAP    Vertigo 01/03/2015    Patient Active Problem List   Diagnosis Date Noted   DKA, type 2 ([place]) 01/29/2020   DKA (diabetic ketoacidosis) ([place]) 01/23/2020   Chronic pain 01/23/2020   At risk for prolonged QT interval syndrome 12/23/2019   MDD (major depressive disorder), recurrent, in partial remission ([place]) 12/23/2019   Microalbuminuria 08/23/2019   Personality disorder, unspecified ([place]) 07/12/2019   Bereavement 07/12/2019   DKA (diabetic ketoacidoses) ([place]) 07/03/2019   Depression with anxiety 07/03/2019   COVID-19 virus infection 07/03/2019   Nausea vomiting and diarrhea  07/03/2019   MDD (major depressive disorder), recurrent episode, moderate ([place]) 04/21/2019   GAD (generalized anxiety disorder) 04/21/2019   Social anxiety disorder 04/21/2019   Erectile dysfunction 10/03/2017   Anxiety 08/08/2017   Elevated transaminase level 08/28/2016   Rectus diastasis 02/14/2016   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   Panic attacks 06/22/2015   Depression 06/22/2015   Diabetes mellitus ([place]) 01/03/2015   Eating disorder 01/03/2015   GERD (gastroesophageal reflux disease) 01/03/2015   Hearing loss 01/03/2015   Leg cramps 01/03/2015   Low back pain 01/03/2015   Adrenal mass, right ([place]) 06/18/2012   Obstructive sleep apnea 07/08/2010   Cluster headache 12/01/2008   Fam hx-ischem heart disease 10/03/2008   Obesity, Class III, BMI 40-49.9 (morbid obesity) ([place]) 09/30/2008   Hyperlipidemia, mixed 12/12/2004    Past Surgical History:  Procedure Laterality Date   BONE TUMOR EXCISION  1992   left arm   CHOLECYSTECTOMY  2010   lap Cholecystectomy   ct scan of abdomen  06/18/2012   with Contrast; 7cm right adrenal mass. Myolipoma vs liposarcome, 3cm adrenal mass on left   CT scan of Brain  12/23/2011   without contrast, [facility], Normal   TONSILLECTOMY  1985    Prior to Admission medications   Medication Sig Start Date End Date Taking? Authorizing Provider  ALPRAZolam (XANAX) 0.5 MG tablet TAKE 1 TABLET (0.5 MG TOTAL) BY MOUTH EVERY 4 (FOUR) HOURS AS NEEDED. FOR ANXIETY 12/10/19   Birdie Sons, MD  blood glucose  meter kit and supplies KIT Dispense based on patient and insurance preference. Use up to four times daily as directed. (FOR ICD-9 250.00, 250.01). 01/31/20   Kayleen Memos, DO  insulin aspart (NOVOLOG FLEXPEN) 100 UNIT/ML FlexPen Inject 3 Units into the skin 3 (three) times daily with meals. 01/31/20   Kayleen Memos, DO  Insulin Glargine (BASAGLAR KWIKPEN) 100 UNIT/ML Inject 0.15 mLs (15 Units total) into the skin daily.  02/01/20   Trinna Post, PA-C  Insulin Pen Needle 32G X 4 MM MISC 756 applicators by Does not apply route 4 (four) times daily. 01/31/20   Kayleen Memos, DO  ketoconazole (NIZORAL) 2 % cream APPLY TO AFFECTED AREA ONCE DAILY FOR 7 DAYS AS NEEDED Patient taking differently: Apply 1 application topically daily. APPLY TO AFFECTED AREA ONCE DAILY FOR 7 DAYS AS NEEDED 03/21/19   Birdie Sons, MD  lamoTRIgine (LAMICTAL) 150 MG tablet Take 1 tablet (150 mg total) by mouth daily. 12/23/19   Ursula Alert, MD  OLANZapine (ZYPREXA) 5 MG tablet Take 5 mg by mouth at bedtime.  12/10/19 03/09/20  [provider]  omeprazole (PRILOSEC) 40 MG capsule TAKE 1 CAPSULE (40 MG TOTAL) BY MOUTH DAILY. 12/21/19   Birdie Sons, MD  ondansetron (ZOFRAN) 4 MG tablet Take 1 tablet (4 mg total) by mouth daily as needed for nausea or vomiting. 07/05/19 07/04/20  Sidney Ace, MD  oxyCODONE-acetaminophen (PERCOCET) 7.5-325 MG tablet Take 1 tablet by mouth every 4 (four) hours as needed. 02/01/20   Birdie Sons, MD  Potassium & Sodium Phosphates (PHOSPHORUS W/SOD & POTASSIUM) 280-160-250 MG PACK Take 1 Package by mouth 3 (three) times daily after meals for 14 days. 01/25/20 02/08/20  Barb Merino, MD  potassium chloride SA (KLOR-CON) 20 MEQ tablet Take 2 tablets (40 mEq total) by mouth daily for 3 days. 01/31/20 02/03/20  Kayleen Memos, DO  promethazine (PHENERGAN) 25 MG tablet TAKE 1 TABLET (25 MG TOTAL) BY MOUTH EVERY 6 (SIX) HOURS AS NEEDED FOR NAUSEA OR VOMITING. 10/13/19   Birdie Sons, MD  rosuvastatin (CRESTOR) 20 MG tablet TAKE 1 TABLET BY MOUTH EVERY DAY Patient taking differently: Take 20 mg by mouth daily.  12/20/19   Birdie Sons, MD  Vilazodone HCl (VIIBRYD) 40 MG TABS Take 1 tablet (40 mg total) by mouth daily. 12/23/19   Ursula Alert, MD    Allergies Bupropion and Morphine  Family History  Problem Relation Age of Onset   Depression Mother    Heart attack Father 35   Arthritis  Other    Alcohol abuse Other    Lung cancer Other    Hyperlipidemia Other    CAD Other    Stroke Other    Hypertension Other    Bipolar disorder Other     Social History Social History   Tobacco Use   Smoking status: Former Smoker    Years: 5.00    Quit date: 06/17/1997    Years since quitting: 22.6   Smokeless tobacco: Never Used   Tobacco comment: started smoking at ahe 16, and quit at age 42  Vaping Use   Vaping Use: Never used  Substance Use Topics   Alcohol use: Yes    Alcohol/week: 0.0 standard drinks    Comment: occasional alcohol use   Drug use: No    Review of Systems  Constitutional: No fever/chills.  Positive for malaise. Eyes: No visual changes. ENT: No sore throat. Cardiovascular: Denies chest pain. Respiratory:  Denies shortness of breath. Gastrointestinal: Positive for abdominal pain.  Positive for nausea, no vomiting.  No diarrhea.  No constipation. Genitourinary: Negative for dysuria. Musculoskeletal: Negative for back pain. Skin: Negative for rash. Neurological: Negative for headaches, focal weakness or numbness.  ____________________________________________   PHYSICAL EXAM:  VITAL SIGNS: ED Triage Vitals  Enc Vitals Group     BP 02/06/20 1133 125/67     Pulse Rate 02/06/20 1133 85     Resp 02/06/20 1133 18     Temp 02/06/20 1133 98.1 F (36.7 C)     Temp Source 02/06/20 1133 Oral     SpO2 02/06/20 1133 96 %     Weight 02/06/20 1134 300 lb (136.1 kg)     Height 02/06/20 1134 $RemoveBefor'6\' 1"'pQStksEcTdjt$  (1.854 m)     Head Circumference --      Peak Flow --      Pain Score 02/06/20 1133 10     Pain Loc --      Pain Edu? --      Excl. in [place]? --     Constitutional: Alert and oriented. Eyes: Conjunctivae are normal. Head: Atraumatic. Nose: No congestion/rhinnorhea. Mouth/Throat: Mucous membranes are moist. Neck: Normal ROM Cardiovascular: Normal rate, regular rhythm. Grossly normal heart sounds. Respiratory: Normal respiratory effort.  No  retractions. Lungs CTAB. Gastrointestinal: Soft and nontender. No distention. Genitourinary: deferred Musculoskeletal: No lower extremity tenderness nor edema. Neurologic:  Normal speech and language. No gross focal neurologic deficits are appreciated. Skin:  Skin is warm, dry and intact. No rash noted. Psychiatric: Mood and affect are normal. Speech and behavior are normal.  ____________________________________________   LABS (all labs ordered are listed, but only abnormal results are displayed)  Labs Reviewed  COMPREHENSIVE METABOLIC PANEL - Abnormal; Notable for the following components:      Result Value   Chloride 95 (*)    Glucose, Bld 300 (*)    Total Bilirubin 1.4 (*)    Anion gap 16 (*)    All other components within normal limits  URINALYSIS, COMPLETE (UACMP) WITH MICROSCOPIC - Abnormal; Notable for the following components:   Color, Urine STRAW (*)    APPearance CLEAR (*)    Glucose, UA >=500 (*)    Ketones, ur 80 (*)    All other components within normal limits  GLUCOSE, CAPILLARY - Abnormal; Notable for the following components:   Glucose-Capillary 284 (*)    All other components within normal limits  GLUCOSE, CAPILLARY - Abnormal; Notable for the following components:   Glucose-Capillary 205 (*)    All other components within normal limits  LIPASE, BLOOD  CBC   ____________________________________________  EKG  ED ECG REPORT I, Blake Divine, the attending physician, personally viewed and interpreted this ECG.   Date: 02/06/2020  EKG Time: 11:33  Rate: 82  Rhythm: normal sinus rhythm, PVCs  Axis: Normal  Intervals:none  ST&T Change: None   PROCEDURES  Procedure(s) performed (including Critical Care):  Procedures   ____________________________________________   INITIAL IMPRESSION / ASSESSMENT AND PLAN / ED COURSE       44 year old male with past medical history of type 2 diabetes, OSA, and chronic pain who presents to the ED complaining  of generalized weakness along with hyperglycemia, epigastric pain, and nausea.  The epigastric pain and nausea is chronic for him and he has no tenderness on my exam, LFTs and lipase are reassuring.  Lab work does show hyperglycemia, but no evidence of DKA given his normal bicarb with very mildly  elevated anion gap.  He was hydrated with IV fluids and states he feels better at this time.  He has follow-up scheduled with his PCP in 2 days and is working on referral to an endocrinologist.  He is appropriate for discharge home and was counseled to return to the ED for new or worsening symptoms.  Patient agrees with plan.  While awaiting discharge, patient did request medication for headache, which she says has been ongoing for multiple days and gradually worsening.  Do not suspect SAH given gradual worsening with reassuring neurologic exam.  He was counseled to follow-up with his PCP and return to the ED for new or worsening symptoms.  Patient agrees with plan.      ____________________________________________   FINAL CLINICAL IMPRESSION(S) / ED DIAGNOSES  Final diagnoses:  Hyperglycemia     ED Discharge Orders    None       Note:  This document was prepared using Dragon voice recognition software and may include unintentional dictation errors.   Blake Divine, MD 02/06/20 2992    Blake Divine, MD 02/06/20 [phone]

## 2020-02-06 NOTE — ED Triage Notes (Signed)
Pt comes POV with hyperglycemia. States recent T2D transition into insulin dependence. Has been here multiple times recently for DKA. Pt has been vomiting and ha a bad headache. Last CBG at home was 425.

## 2020-02-06 NOTE — ED Notes (Signed)
Pt reports elevated CBG at home, A&O x4, NAD

## 2020-02-08 ENCOUNTER — Encounter: Payer: Self-pay | Admitting: Family Medicine

## 2020-02-08 ENCOUNTER — Inpatient Hospital Stay: Payer: 59 | Admitting: Physician Assistant

## 2020-02-08 ENCOUNTER — Other Ambulatory Visit: Payer: Self-pay

## 2020-02-08 ENCOUNTER — Ambulatory Visit: Payer: 59 | Admitting: Family Medicine

## 2020-02-08 VITALS — BP 119/78 | HR 78 | Temp 98.3°F | Resp 16 | Wt 322.0 lb

## 2020-02-08 DIAGNOSIS — E1110 Type 2 diabetes mellitus with ketoacidosis without coma: Secondary | ICD-10-CM

## 2020-02-08 DIAGNOSIS — R1115 Cyclical vomiting syndrome unrelated to migraine: Secondary | ICD-10-CM

## 2020-02-08 DIAGNOSIS — Z23 Encounter for immunization: Secondary | ICD-10-CM

## 2020-02-08 MED ORDER — LEVEMIR FLEXTOUCH 100 UNIT/ML ~~LOC~~ SOPN: 28.0000 [IU] | PEN_INJECTOR | Freq: Every day | SUBCUTANEOUS | Status: DC

## 2020-02-08 NOTE — Progress Notes (Signed)
I,[name],acting as a scribe for [name], MD.,have documented all relevant documentation on the behalf of [name], MD,as directed by  [name], MD while in the presence of [name], MD.  Established patient visit   Patient: Raymond Rose   DOB: 15-Sep-1975   44 y.o. Male  MRN: [id number] Visit Date: 02/08/2020  Today's healthcare provider: [name], MD   Chief Complaint  Patient presents with  . Hospitalization Follow-up   Subjective    HPI  Follow up Hospitalization  Patient was [facility] to [facility] on [facility]/29/2020 and discharged on 01/31/2020. Was also hospitalized 01-23-2020  Through 01/25/2020 for same. He was reevaluated in the ER on 02/06/2020. He was treated for Diabetic Ketoacidosis and hyperglycemia. Upon discharge on 01/31/2020, metformin, farxiga, Trulicity and glipizide were discontinued and he was started on Levemir and Novolog as below. He was also started on magnesium and potassium.   He reports good compliance with treatment. He reports this condition is stayed the same. Patient states his home blood sugars are still averaging around 300. He reports the lowest blood sugar reading was 250 when fasting. He is currently taking 20 units of Levemir daily and Novolog 3 units with each meal. Patient would like a referral to Endocrinology.   -----------------------------------------------------------------------------------------       Medications: Outpatient Medications Prior to Visit  Medication Sig  . ALPRAZolam (XANAX) 0.5 MG tablet TAKE 1 TABLET (0.5 MG TOTAL) BY MOUTH EVERY 4 (FOUR) HOURS AS NEEDED. FOR ANXIETY  . blood glucose meter kit and supplies KIT Dispense based on patient and insurance preference. Use up to four times daily as directed. (FOR ICD-9 250.00, 250.01).  . insulin aspart (NOVOLOG FLEXPEN) 100 UNIT/ML FlexPen Inject 3 Units into the skin 3 (three) times daily with meals.  . Insulin Pen Needle 32G X 4 MM MISC 878  applicators by Does not apply route 4 (four) times daily.  Marland Kitchen ketoconazole (NIZORAL) 2 % cream APPLY TO AFFECTED AREA ONCE DAILY FOR 7 DAYS AS NEEDED (Patient taking differently: Apply 1 application topically daily. APPLY TO AFFECTED AREA ONCE DAILY FOR 7 DAYS AS NEEDED)  . lamoTRIgine (LAMICTAL) 150 MG tablet Take 1 tablet (150 mg total) by mouth daily.  Marland Kitchen LEVEMIR FLEXTOUCH 100 UNIT/ML FlexPen Inject 20 Units into the skin daily.  Marland Kitchen OLANZapine (ZYPREXA) 5 MG tablet Take 5 mg by mouth at bedtime.   Marland Kitchen omeprazole (PRILOSEC) 40 MG capsule TAKE 1 CAPSULE (40 MG TOTAL) BY MOUTH DAILY.  Marland Kitchen ondansetron (ZOFRAN) 4 MG tablet Take 1 tablet (4 mg total) by mouth daily as needed for nausea or vomiting.  Marland Kitchen oxyCODONE-acetaminophen (PERCOCET) 7.5-325 MG tablet Take 1 tablet by mouth every 4 (four) hours as needed.  . Potassium & Sodium Phosphates (PHOSPHORUS W/SOD & POTASSIUM) 280-160-250 MG PACK Take 1 Package by mouth 3 (three) times daily after meals for 14 days.  . promethazine (PHENERGAN) 25 MG tablet TAKE 1 TABLET (25 MG TOTAL) BY MOUTH EVERY 6 (SIX) HOURS AS NEEDED FOR NAUSEA OR VOMITING.  . rosuvastatin (CRESTOR) 20 MG tablet TAKE 1 TABLET BY MOUTH EVERY DAY (Patient taking differently: Take 20 mg by mouth daily. )  . Vilazodone HCl (VIIBRYD) 40 MG TABS Take 1 tablet (40 mg total) by mouth daily.  . potassium chloride SA (KLOR-CON) 20 MEQ tablet Take 2 tablets (40 mEq total) by mouth daily for 3 days.  . [DISCONTINUED] Insulin Glargine (BASAGLAR KWIKPEN) 100 UNIT/ML Inject 0.15 mLs (15 Units total) into the  skin daily. (Patient not taking: Reported on 02/08/2020)   No facility-administered medications prior to visit.    Review of Systems  Constitutional: Negative for appetite change, chills and fever.  Respiratory: Negative for chest tightness, shortness of breath and wheezing.   Cardiovascular: Negative for chest pain and palpitations.  Gastrointestinal: Negative for abdominal pain, nausea and vomiting.       Objective    BP 119/78 (BP Location: Left Arm, Patient Position: Sitting, Cuff Size: Large)   Pulse 78   Temp 98.3 F (36.8 C) (Oral)   Resp 16   Wt (!) 322 lb (146.1 kg)   BMI 42.48 kg/m    Physical Exam   General: Appearance:    Severely obese male in no acute distress  Eyes:    PERRL, conjunctiva/corneas clear, EOM's intact       Lungs:     Clear to auscultation bilaterally, respirations unlabored  Heart:    Normal heart rate. Normal rhythm. No murmurs, rubs, or gallops.   MS:   All extremities are intact.   Neurologic:   Awake, alert, oriented x 3. No apparent focal neurological           defect.         No results found for any visits on 02/08/20.  Assessment & Plan      1. Type 2 diabetes mellitus with ketoacidosis without coma, without long-term current use of insulin (HCC) Complicated by cyclic vomiting syndrome and multiple psychiatric medications. Now off of metformin, glipizide, Farxiga and Trulicity. He would like to go ahead and start management with endocrinologist.  - Ambulatory referral to Endocrinology  In the meantime advised goal is to get fasting sugars to the low 100s so will increase Levemir to 28 units every day. He is currently only taking 3 units of Novolog before meals. Will change to simple sliding scale of 4-10 units before meals.   2. Cyclic vomiting syndrome Stable, continue follow up [facility] as scheduled.   3. Need for influenza vaccination  - Flu Vaccine QUAD 6+ mos PF IM (Fluarix Quad PF)  Lab is currently closed. Will have him return unless endocrinology can see him within a few weeks.      The entirety of the information documented in the History of Present Illness, Review of Systems and Physical Exam were personally obtained by me. Portions of this information were initially documented by the CMA and reviewed by me for thoroughness and accuracy.      [name], MD  [facility] [phone]  (phone) [phone] (fax)  [place]

## 2020-02-08 NOTE — Patient Instructions (Signed)
.   Increase Levemir to 28 units every day  Check blood sugar before each meal      If blood sugar is >=100, but <150, take 4 units Novolog insulin before eating     If blood sugar is >=150, but <250, take 6 units Novolog insulin before eating     If blood sugar is >=250, but <350, take 8 units Novolog insulin     If blood sugar is >=350, but <450, take 10 units Novolog insulin     If blood sugar is >=450, take 10 units and go to if ER it doesn't drop below 400 within 20 minutes   Please record all blood sugars and bring record to your next appointment.

## 2020-02-11 ENCOUNTER — Other Ambulatory Visit: Payer: Self-pay | Admitting: Family Medicine

## 2020-02-11 MED ORDER — OXYCODONE-ACETAMINOPHEN 7.5-325 MG PO TABS: 1.0000 | ORAL_TABLET | ORAL | 0 refills | Status: DC | PRN

## 2020-02-18 ENCOUNTER — Other Ambulatory Visit: Payer: Self-pay | Admitting: Family Medicine

## 2020-02-18 DIAGNOSIS — F419 Anxiety disorder, unspecified: Secondary | ICD-10-CM

## 2020-02-18 DIAGNOSIS — E119 Type 2 diabetes mellitus without complications: Secondary | ICD-10-CM

## 2020-02-21 ENCOUNTER — Encounter: Payer: Self-pay | Admitting: Family Medicine

## 2020-02-22 ENCOUNTER — Other Ambulatory Visit: Payer: Self-pay | Admitting: Family Medicine

## 2020-02-22 ENCOUNTER — Telehealth: Payer: Self-pay

## 2020-02-22 DIAGNOSIS — E1110 Type 2 diabetes mellitus with ketoacidosis without coma: Secondary | ICD-10-CM

## 2020-02-22 DIAGNOSIS — N529 Male erectile dysfunction, unspecified: Secondary | ICD-10-CM

## 2020-02-22 MED ORDER — SILDENAFIL CITRATE 50 MG PO TABS
50.0000 mg | ORAL_TABLET | Freq: Every day | ORAL | 3 refills | Status: DC | PRN
Start: 2020-02-22 — End: 2021-09-06

## 2020-02-22 NOTE — Telephone Encounter (Signed)
Copied from [place] [phone]. Topic: General - Inquiry >> Feb 22, 2020  3:47 PM Greggory Keen D wrote: Reason for CRM: Pt is asking about a refill for his insulin pins. BD Nano 32G x 4 mm  [facility]  CB#  [phone]  Please review for refill.  Last refill on 01/31/20 #100 by another provider. LOV: 02/08/20

## 2020-02-22 NOTE — Telephone Encounter (Signed)
Letter is written and on my work station. Please advise and see if he wants to pick it up.

## 2020-02-22 NOTE — Telephone Encounter (Signed)
Please review patient's message sent via my chart. I don't see Sildenafil listed on his active medication list.

## 2020-02-22 NOTE — Telephone Encounter (Signed)
Hi Doctor Risk manager,  Could you please submit a new prescription for Sildenafil? My previous insurance would only cover 6 but with the prescription card I have now it can be 27.  Six pills cost me $11, where I can now get 90 for $25. Please send to the [facility] on [address] [address] [facility] in [place].  Thank you,  Raymond Rose

## 2020-02-23 MED ORDER — INSULIN PEN NEEDLE 32G X 4 MM MISC: 100.0000 | Freq: Four times a day (QID) | 3 refills | Status: DC

## 2020-02-23 MED ORDER — OXYCODONE-ACETAMINOPHEN 7.5-325 MG PO TABS
1.0000 | ORAL_TABLET | ORAL | 0 refills | Status: DC | PRN
Start: 2020-02-23 — End: 2020-03-03

## 2020-02-28 ENCOUNTER — Other Ambulatory Visit: Payer: Self-pay | Admitting: Family Medicine

## 2020-02-29 ENCOUNTER — Telehealth: Payer: 59 | Admitting: Psychiatry

## 2020-03-02 ENCOUNTER — Telehealth (INDEPENDENT_AMBULATORY_CARE_PROVIDER_SITE_OTHER): Payer: 59 | Admitting: Psychiatry

## 2020-03-02 ENCOUNTER — Encounter: Payer: Self-pay | Admitting: Psychiatry

## 2020-03-02 ENCOUNTER — Other Ambulatory Visit: Payer: Self-pay

## 2020-03-02 ENCOUNTER — Other Ambulatory Visit: Payer: Self-pay | Admitting: Family Medicine

## 2020-03-02 DIAGNOSIS — F4010 Social phobia, unspecified: Secondary | ICD-10-CM

## 2020-03-02 DIAGNOSIS — Z79899 Other long term (current) drug therapy: Secondary | ICD-10-CM

## 2020-03-02 DIAGNOSIS — F609 Personality disorder, unspecified: Secondary | ICD-10-CM

## 2020-03-02 DIAGNOSIS — F331 Major depressive disorder, recurrent, moderate: Secondary | ICD-10-CM

## 2020-03-02 DIAGNOSIS — F411 Generalized anxiety disorder: Secondary | ICD-10-CM | POA: Diagnosis not present

## 2020-03-02 MED ORDER — VILAZODONE HCL 40 MG PO TABS: 40.0000 mg | ORAL_TABLET | Freq: Every day | ORAL | 0 refills | Status: DC

## 2020-03-02 MED ORDER — LITHIUM CARBONATE 300 MG PO CAPS: 300.0000 mg | ORAL_CAPSULE | Freq: Every day | ORAL | 1 refills | Status: DC

## 2020-03-02 MED ORDER — LAMOTRIGINE 150 MG PO TABS: 150.0000 mg | ORAL_TABLET | Freq: Every day | ORAL | 0 refills | Status: DC

## 2020-03-02 NOTE — Patient Instructions (Signed)
Lithium tablets or capsules What is this medicine? LITHIUM (LITH ee um) is used to prevent and treat the manic episodes caused by manic-depressive illness. This medicine may be used for other purposes; ask your health care provider or pharmacist if you have questions. COMMON BRAND NAME(S): Eskalith What should I tell my health care provider before I take this medicine? They need to know if you have any of these conditions:  active infection  breathing problems  Brugada Syndrome  dehydration (diarrhea or sweating)  diet low in salt  heart disease  high levels of calcium in the blood  history of irregular heartbeat  kidney disease  low level of potassium or sodium in the blood  parathyroid disease  problems urinating  thyroid disease  an unusual or allergic reaction to lithium, other medicines, foods, dyes, or preservatives  pregnant or trying to get pregnant  breast-feeding How should I use this medicine? Take this medicine by mouth with a glass of water. Follow the directions on the prescription label. Take after a meal or snack to avoid stomach upset. Take your doses at regular intervals. Do not take your medicine more often than directed. The amount of this medicine you take is very important. Taking more than the prescribed dose can cause serious side effects. Do not stop taking except on the advice of your doctor or health care professional. Talk to your pediatrician regarding the use of this medicine in children. Special care may be needed. While this drug may be prescribed for children as young as 7 years for selected conditions, precautions do apply. Overdosage: If you think you have taken too much of this medicine contact a poison control center or emergency room at once. NOTE: This medicine is only for you. Do not share this medicine with others. What if I miss a dose? If you miss a dose, take it as soon as you can. If it is almost time for your next dose, take  only that dose. Do not take double or extra doses. What may interact with this medicine? Do not take this medicine with any of the following medications:  cisapride  dronedarone  pimozide  thioridazine This medicine may also interact with the following medications:  buspirone  caffeine  calcium iodide  carbamazepine  certain medicines for depression, anxiety, or psychotic disturbances  certain medicines for high blood pressure  certain medicines for migraine headache like almotriptan, eletriptan, frovatriptan, naratriptan, rizatriptan, sumatriptan, zolmitriptan  diuretics  fentanyl  linezolid  MAOIs like Carbex, Eldepryl, Marplan, Nardil, and Parnate  medicines that relax muscles for surgery  metronidazole  NSAIDs, medicines for pain and inflammation, like ibuprofen or naproxen  other medicines that prolong the QT interval (cause an abnormal heart rhythm) like dofetilide  phenytoin  potassium iodide, KI  sodium bicarbonate  sodium chloride  St. John's Wort  theophylline  tramadol  tryptophan  urea This list may not describe all possible interactions. Give your health care provider a list of all the medicines, herbs, non-prescription drugs, or dietary supplements you use. Also tell them if you smoke, drink alcohol, or use illegal drugs. Some items may interact with your medicine. What should I watch for while using this medicine? Visit your doctor or health care professional for regular checks on your progress. It can take several weeks of treatment before you start to get better. The amount of salt (sodium) in your body influences the effects of this medicine, and this medicine can increase salt loss from the body. Eat  a normal diet that includes salt. Do not change to salt substitutes. Avoid changes involving diet, or medications that include large amounts of sodium like sodium bicarbonate. Ask your doctor or health care professional for advice if you  are not sure. Drink plenty of fluids while you are taking this medicine. Avoid drinks that contain caffeine, such as coffee, tea and colas. You will need extra fluids if you have diarrhea or sweat a lot. This will help prevent toxic effects from this medicine. Be careful not to get overheated during exercise, saunas, hot baths, and hot weather. Consult your doctor or health care professional if you have a high fever or persistent diarrhea. You may get drowsy or dizzy. Do not drive, use machinery, or do anything that needs mental alertness until you know how this medicine affects you. Do not stand or sit up quickly, especially if you are an older patient. This reduces the risk of dizzy or fainting spells. What side effects may I notice from receiving this medicine? Side effects that you should report to your doctor or health care professional as soon as possible:  allergic reactions like skin rash, itching or hives, swelling of the face, lips, or tongue  breathing problems  changes in emotions or moods  changes in vision  fingers or toes feel numb, cool, painful  hallucinations, loss of contact with reality  increased thirst  increased urination  mild tremor (hands)  persistent headache with or without blurred vision  signs and symptoms of a dangerous change in heartbeat or heart rhythm like chest pain; dizziness; fast, irregular heartbeat; palpitations; feeling faint or lightheaded; falls; breathing problems  signs and symptoms of lithium toxicity like diarrhea, vomiting, increased tremor, loss of balance or coordination, drowsiness, ringing of the ears, muscle weakness or twitching, slurred speech, confusion, seizures  unusually weak or tired Side effects that usually do not require medical attention (report to your doctor or health care professional if they continue or are bothersome):  decreased appetite  mild thirst  nausea  stomach upset  tiredness This list may not  describe all possible side effects. Call your doctor for medical advice about side effects. You may report side effects to FDA at 1-800-FDA-1088. Where should I keep my medicine? Keep out of the reach of children. Store at room temperature between 15 and 30 degrees C (59 and 86 degrees F). Throw away any unused medicine after the expiration date. NOTE: This sheet is a summary. It may not cover all possible information. If you have questions about this medicine, talk to your doctor, pharmacist, or health care provider.  2020 Elsevier/Gold Standard (2018-07-29 11:12:18)

## 2020-03-02 NOTE — Progress Notes (Addendum)
This note is not being shared with the patient for the following reason: To prevent harm (release of this note would result in harm to the life or physical safety of the patient or another).  Provider Location : [facility] Patient Location : Car  Participants: Patient , Provider  Virtual Visit via Video Note  I connected with Raymond Rose on 03/02/20 at  4:00 PM EDT by a video enabled telemedicine application and verified that I am speaking with the correct person using two identifiers.   I discussed the limitations of evaluation and management by telemedicine and the availability of in person appointments. The patient expressed understanding and agreed to proceed.    I discussed the assessment and treatment plan with the patient. The patient was provided an opportunity to ask questions and all were answered. The patient agreed with the plan and demonstrated an understanding of the instructions.   The patient was advised to call back or seek an in-person evaluation if the symptoms worsen or if the condition fails to improve as anticipated.   [place] MD OP Progress Note  03/02/2020 5:17 PM Raymond Rose  MRN:  [id number]  Chief Complaint:  Chief Complaint    Follow-up     HPI: Raymond Rose is a 44 year old Caucasian male, unemployed, married, lives in [place], has a history of depression, GAD, social anxiety disorder, sleep apnea, cyclical vomiting, diabetes melitis, erectile dysfunction was evaluated by telemedicine today.  Patient today reports since his last visit with writer he had at least 3 visits to the hospital and he was admitted for diabetes related problems including ketoacidosis.  He reports he is currently recovering okay.  He reports he was doing well with his mood for a long time but his wife observed big change in him and also complemented him.  He however reports since the past week or more he has been spiraling down again.  He has good days and bad days.   He reports he has a lot of negative thoughts and also continues to have some negative death wish.  He however denies any active suicidal thoughts or plan at this time.  Patient reports he had to stop taking the olanzapine due to side effects.  Patient is compliant on his other medications.  Patient reports sleep is fair.  He denies any other concerns today.  Visit Diagnosis:    ICD-10-CM   1. MDD (major depressive disorder), recurrent episode, moderate (HCC)  F33.1 lithium carbonate 300 MG capsule  2. GAD (generalized anxiety disorder)  F41.1 lamoTRIgine (LAMICTAL) 150 MG tablet    Vilazodone HCl (VIIBRYD) 40 MG TABS  3. Social anxiety disorder  F40.10   4. Personality disorder, unspecified ([place])  F60.9   5. High risk medication use  Z79.899 Lithium level    Past Psychiatric History: I have reviewed past psychiatric history from my progress note on 04/21/2019.  Past trials of Wellbutrin, Lexapro, Elavil, Effexor, zyprexa, seroquel  Past Medical History:  Past Medical History:  Diagnosis Date  . Anxiety   . Arthritis   . COVID-19 06/2019  . Cyclical vomiting   . Depression   . Diabetes mellitus, type II ([place])   . OSA on CPAP   . Vertigo 01/03/2015    Past Surgical History:  Procedure Laterality Date  . 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 CHOLECYSTECTOMY  2010   lap Cholecystectomy  . 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 CT scan of Brain  12/23/2011   without contrast, [facility], Normal  . TONSILLECTOMY  1985    Family Psychiatric History: I have reviewed family psychiatric history from my progress note on 04/21/2019  Family History:  Family History  Problem Relation Age of Onset  . Depression Mother   . Heart attack Father 24  . Arthritis Other   . Alcohol abuse Other   . Lung cancer Other   . Hyperlipidemia Other   . CAD Other   . Stroke Other   . Hypertension Other   .  Bipolar disorder Other     Social History: I have reviewed social history from my progress note on 04/21/2019 Social History   Socioeconomic History  . Marital status: Married    Spouse name: Not on file  . Number of children: Not on file  . Years of education: Not on file  . Highest education level: Not on file  Occupational History  . Occupation: Banking    Comment: Engineer, manufacturing  Tobacco Use  . Smoking status: Former Smoker    Years: 5.00    Quit date: 06/17/1997    Years since quitting: 22.7  . Smokeless tobacco: Never Used  . Tobacco comment: started smoking at ahe 16, and quit at age 41  Vaping Use  . Vaping Use: Never used  Substance and Sexual Activity  . Alcohol use: Yes    Alcohol/week: 0.0 standard drinks    Comment: occasional alcohol use  . Drug use: No  . Sexual activity: Not on file  Other Topics Concern  . Not on file  Social History Narrative  . Not on file   Social Determinants of Health   Financial Resource Strain:   . Difficulty of Paying Living Expenses: Not on file  Food Insecurity:   . Worried About Charity fundraiser in the Last Year: Not on file  . Ran Out of Food in the Last Year: Not on file  Transportation Needs:   . Lack of Transportation (Medical): Not on file  . Lack of Transportation (Non-Medical): Not on file  Physical Activity:   . Days of Exercise per Week: Not on file  . Minutes of Exercise per Session: Not on file  Stress:   . Feeling of Stress : Not on file  Social Connections:   . Frequency of Communication with Friends and Family: Not on file  . Frequency of Social Gatherings with Friends and Family: Not on file  . Attends Religious Services: Not on file  . Active Member of Clubs or Organizations: Not on file  . Attends Archivist Meetings: Not on file  . Marital Status: Not on file    Allergies:  Allergies  Allergen Reactions  . Bupropion     Extreme anxiety  . Morphine     Metabolic Disorder Labs: Lab  Results  Component Value Date   HGBA1C 10.1 (H) 01/23/2020   MPG 243.17 01/23/2020   No results found for: PROLACTIN Lab Results  Component Value Date   CHOL 173 05/24/2019   TRIG 718 (HH) 05/24/2019   HDL 30 (L) 05/24/2019   CHOLHDL 5.8 (H) 05/24/2019   LDLCALC 42 05/24/2019   [place] Comment 10/30/2018   Lab Results  Component Value Date   TSH 1.360 05/24/2019   TSH 1.570 01/24/2017    Therapeutic Level Labs: No results found for: LITHIUM No results found for: VALPROATE No  components found for:  CBMZ  Current Medications: Current Outpatient Medications  Medication Sig Dispense Refill  . ALPRAZolam (XANAX) 0.5 MG tablet TAKE 1 TABLET (0.5 MG TOTAL) BY MOUTH EVERY 4 (FOUR) HOURS AS NEEDED. FOR ANXIETY 16 tablet 1  . blood glucose meter kit and supplies KIT Dispense based on patient and insurance preference. Use up to four times daily as directed. (FOR ICD-9 250.00, 250.01). 1 each 0  . insulin aspart (NOVOLOG FLEXPEN) 100 UNIT/ML FlexPen Inject 3 Units into the skin 3 (three) times daily with meals. 15 mL 0  . Insulin Pen Needle 32G X 4 MM MISC 235 applicators by Does not apply route 4 (four) times daily. 100 each 3  . ketoconazole (NIZORAL) 2 % cream APPLY TO AFFECTED AREA ONCE DAILY FOR 7 DAYS AS NEEDED (Patient taking differently: Apply 1 application topically daily. APPLY TO AFFECTED AREA ONCE DAILY FOR 7 DAYS AS NEEDED) 15 g 3  . lamoTRIgine (LAMICTAL) 150 MG tablet Take 1 tablet (150 mg total) by mouth daily. 90 tablet 0  . LEVEMIR FLEXTOUCH 100 UNIT/ML FlexPen Inject 28 Units into the skin daily.    Marland Kitchen lithium carbonate 300 MG capsule Take 1 capsule (300 mg total) by mouth daily with supper. 30 capsule 1  . metFORMIN (GLUCOPHAGE-XR) 500 MG 24 hr tablet TAKE 2 TABLETS BY MOUTH TWICE A DAY 120 tablet 3  . omeprazole (PRILOSEC) 40 MG capsule TAKE 1 CAPSULE (40 MG TOTAL) BY MOUTH DAILY. 30 capsule 2  . ondansetron (ZOFRAN) 4 MG tablet Take 1 tablet (4 mg total) by mouth daily  as needed for nausea or vomiting. 30 tablet 1  . oxyCODONE-acetaminophen (PERCOCET) 7.5-325 MG tablet Take 1 tablet by mouth every 4 (four) hours as needed. 60 tablet 0  . potassium chloride SA (KLOR-CON) 20 MEQ tablet Take 2 tablets (40 mEq total) by mouth daily for 3 days. 6 tablet 0  . promethazine (PHENERGAN) 25 MG tablet TAKE 1 TABLET (25 MG TOTAL) BY MOUTH EVERY 6 (SIX) HOURS AS NEEDED FOR NAUSEA OR VOMITING. 20 tablet 1  . rosuvastatin (CRESTOR) 20 MG tablet TAKE 1 TABLET BY MOUTH EVERY DAY (Patient taking differently: Take 20 mg by mouth daily. ) 30 tablet 12  . sildenafil (VIAGRA) 50 MG tablet Take 1-2 tablets (50-100 mg total) by mouth daily as needed. 90 tablet 3  . TRULICITY 1.5 TD/3.2KG SOPN SMARTSIG:0.5 Milliliter(s) SUB-Q Once a Week    . Vilazodone HCl (VIIBRYD) 40 MG TABS Take 1 tablet (40 mg total) by mouth daily. 90 tablet 0   No current facility-administered medications for this visit.     Musculoskeletal: Strength & Muscle Tone: UTA Gait & Station: normal Patient leans: N/A  Psychiatric Specialty Exam: Review of Systems  Gastrointestinal: Positive for nausea (Chronic).  Psychiatric/Behavioral: Positive for dysphoric mood.  All other systems reviewed and are negative.   There were no vitals taken for this visit.There is no height or weight on file to calculate BMI.  General Appearance: Casual  Eye Contact:  Fair  Speech:  Clear and Coherent  Volume:  Normal  Mood:  Depressed  Affect:  Congruent  Thought Process:  Goal Directed and Descriptions of Associations: Intact  Orientation:  Full (Time, Place, and Person)  Thought Content: Rumination   Suicidal Thoughts:  No patient does have a history of chronic suicidality, recently had passive death wish-not wanting to be here anymore however denies any active thoughts or plan at this time.  Homicidal Thoughts:  No  Memory:  Immediate;   Fair Recent;   Fair Remote;   Fair  Judgement:  Fair  Insight:  Fair   Psychomotor Activity:  Normal  Concentration:  Concentration: Fair and Attention Span: Fair  Recall:  [employer] of Knowledge: Fair  Language: Fair  Akathisia:  No  Handed:  Right  AIMS (if indicated): UTA  Assets:  Communication Skills Desire for Improvement Housing Social Support  ADL's:  Intact  Cognition: WNL  Sleep:  Fair   Screenings: GAD-7     Office Visit from 04/16/2019 in [facility]  Total GAD-7 Score 20    PHQ2-9     Video Visit from 10/13/2019 in [place] Office Visit from 08/23/2019 in [place] Visit from 04/16/2019 in [facility] Visit from 02/10/2018 in [facility] Visit from 12/24/2016 in [facility]  PHQ-2 Total Score _0 PHQ-9 Total Score _1 Assessment and Plan: Raymond Rose is a 44 year old Caucasian male, unemployed, lives in [place], has a history of depression, anxiety, OSA on CPAP, diabetes melitis, arthritis was evaluated by telemedicine today.  Patient is biologically predisposed given his history of trauma, family history of mental health problems.  Patient is currently struggling with depressive symptoms since the past 1 week.  He will benefit from the following medication changes.  Plan as noted below.  Risk for suicide-passive suicidal thoughts which where recent, mental health problems currently not stable, family history of mental health problems, patient not compliant with psychotherapy sessions, gender, race, access to a weapon. Protective factors are currently denies any active plans or thoughts, has good social support system from his wife who is aware about the fact that he has access to firearms and we will monitor him closely.  He also reports he has never had suicidal plan to use a firearm and reports he will never follow through with any plan even though he has chronic self-injurious  thoughts, denies any suicide attempts in the past, denies family history of suicide attempts. Acute risk for suicide hence is low.  Plan MDD-unstable Continue Viibryd 40 mg p.o. daily Continue Lamictal 150 mg p.o. daily Discontinue olanzapine for noncompliance. Start lithium 300 mg p.o. daily with supper.  GAD-improving Will refer patient for psychotherapy sessions with our practice since he has been noncompliant with his therapy sessions with Ms. Miguel Dibble.  He reports he has not been able to get in touch with this therapist. Patient is also on Xanax as needed which he uses for severe anxiety symptoms.  Social anxiety disorder-improving Continue CBT  High risk medication use-will order lithium level. I have reviewed the most recent CMP dated 02/06/2020-BUN/creatinine-within normal limits.  I have reviewed EKG dated 02/06/2020-QTC within normal limits.  Follow-up in clinic in 3 to 4 weeks or sooner if needed.  I have spent atleast 30 minutes face to face with patient today. More than 50 % of the time was spent for preparing to see the patient ( e.g., review of test, records ), obtaining and to review and separately obtained history , ordering medications and test ,psychoeducation and supportive psychotherapy and care coordination,as well as documenting clinical information in electronic health record. This note was generated in part or whole with voice recognition software. Voice recognition is usually quite accurate but there are transcription errors that can and very often do occur. I apologize for any  typographical errors that were not detected and corrected.       Ursula Alert, MD 03/02/2020, 5:17 PM

## 2020-03-03 ENCOUNTER — Other Ambulatory Visit: Payer: Self-pay | Admitting: Family Medicine

## 2020-03-03 MED ORDER — OXYCODONE-ACETAMINOPHEN 7.5-325 MG PO TABS
1.0000 | ORAL_TABLET | ORAL | 0 refills | Status: DC | PRN
Start: 2020-03-03 — End: 2020-03-13

## 2020-03-03 NOTE — Telephone Encounter (Signed)
faxed and confirmed labwork orders send to [facility]

## 2020-03-06 ENCOUNTER — Other Ambulatory Visit: Payer: Self-pay

## 2020-03-06 ENCOUNTER — Encounter: Payer: Self-pay | Admitting: Family Medicine

## 2020-03-06 DIAGNOSIS — E1110 Type 2 diabetes mellitus with ketoacidosis without coma: Secondary | ICD-10-CM

## 2020-03-06 NOTE — Telephone Encounter (Signed)
Patient sent mychart message requesting refills. Please advise on refill request. The directions for the Novolog sliding scale are different than the changes that were made during the last office visit from 02/08/2020.

## 2020-03-07 ENCOUNTER — Other Ambulatory Visit
Admission: RE | Admit: 2020-03-07 | Discharge: 2020-03-07 | Disposition: A | Discharge status: Home / Self Care | Payer: 59 | Attending: Psychiatry | Admitting: Psychiatry

## 2020-03-07 DIAGNOSIS — Z79899 Other long term (current) drug therapy: Secondary | ICD-10-CM | POA: Insufficient documentation

## 2020-03-07 LAB — LITHIUM LEVEL: Lithium Lvl: 0.15 mmol/L — ABNORMAL LOW (ref 0.60–1.20)

## 2020-03-07 NOTE — Addendum Note (Signed)
Addended by: Santiago Bur on: 03/07/2020 11:54 AM   Modules accepted: Orders

## 2020-03-08 ENCOUNTER — Other Ambulatory Visit: Payer: Self-pay | Admitting: Family Medicine

## 2020-03-08 DIAGNOSIS — F419 Anxiety disorder, unspecified: Secondary | ICD-10-CM

## 2020-03-08 MED ORDER — LEVEMIR FLEXTOUCH 100 UNIT/ML ~~LOC~~ SOPN: 28.0000 [IU] | PEN_INJECTOR | Freq: Every day | SUBCUTANEOUS | 5 refills | Status: DC

## 2020-03-08 MED ORDER — NOVOLOG FLEXPEN 100 UNIT/ML ~~LOC~~ SOPN
PEN_INJECTOR | SUBCUTANEOUS | 5 refills | Status: DC
Start: 2020-03-08 — End: 2021-03-23

## 2020-03-08 NOTE — Telephone Encounter (Signed)
Requested medication (s) are due for refill today: yes   Requested medication (s) are on the active medication list: yes   Last refill:  02/18/20 #16 1 refill   Future visit scheduled: yes 1 month   Notes to clinic:  not delegate per protocol     Requested Prescriptions  Pending Prescriptions Disp Refills   ALPRAZolam (XANAX) 0.5 MG tablet [Pharmacy Med Name: ALPRAZOLAM 0.5 MG TABLET] 16 tablet 1    Sig: TAKE 1 TABLET (0.5 MG TOTAL) BY MOUTH EVERY 4 (FOUR) HOURS AS NEEDED. FOR ANXIETY      Not Delegated - Psychiatry:  Anxiolytics/Hypnotics Failed - 03/08/2020  4:56 PM      Failed - This refill cannot be delegated      Failed - Urine Drug Screen completed in last 360 days.      Passed - Valid encounter within last 6 months    Recent Outpatient Visits           4 weeks ago Type 2 diabetes mellitus with ketoacidosis without coma, without long-term current use of insulin [facility])   [facility] Birdie Sons, MD   1 month ago Weakness   [place], [place], [place]   1 month ago Type 2 diabetes mellitus without complication, without long-term current use of insulin [facility])   [facility] Birdie Sons, MD   5 months ago Nausea vomiting and diarrhea   [facility] Birdie Sons, MD   6 months ago Type 2 diabetes mellitus without complication, without long-term current use of insulin [facility])   [facility] Birdie Sons, MD       Future Appointments             In 1 month Fisher, Kirstie Peri, MD [facility]

## 2020-03-10 ENCOUNTER — Ambulatory Visit (INDEPENDENT_AMBULATORY_CARE_PROVIDER_SITE_OTHER): Payer: 59 | Admitting: Licensed Clinical Social Worker

## 2020-03-10 ENCOUNTER — Other Ambulatory Visit: Payer: Self-pay

## 2020-03-10 DIAGNOSIS — F331 Major depressive disorder, recurrent, moderate: Secondary | ICD-10-CM | POA: Diagnosis not present

## 2020-03-10 DIAGNOSIS — F411 Generalized anxiety disorder: Secondary | ICD-10-CM | POA: Diagnosis not present

## 2020-03-10 NOTE — Progress Notes (Signed)
Virtual Visit via Video Note  I connected with Raymond Rose on 03/10/20 at  8:00 AM EDT by a video enabled telemedicine application and verified that I am speaking with the correct person using two identifiers.  Location: Patient: home Provider: remote office [place], [place])   I discussed the limitations of evaluation and management by telemedicine and the availability of in person appointments. The patient expressed understanding and agreed to proceed.   I discussed the assessment and treatment plan with the patient. The patient was provided an opportunity to ask questions and all were answered. The patient agreed with the plan and demonstrated an understanding of the instructions.   The patient was advised to call back or seek an in-person evaluation if the symptoms worsen or if the condition fails to improve as anticipated.  I provided 55 minutes of non-face-to-face time during this encounter.   Raymond Rose R Raymond Gladue, LCSW    THERAPIST PROGRESS NOTE  Session Time: 8:00-8:55 am  Participation Level: Active  Behavioral Response: NeatAlertDepressed  Type of Therapy: Individual Therapy  Treatment Goals addressed: Anxiety and Coping  Interventions: CBT and Supportive  Summary: Raymond Rose is a 44 y.o. male who presents with symptoms related to his diagnoses (depression, anxiety). Pt reports that he feels his anxiety levels have increased recently--having more frequent panic attacks. Pt reports that he is compliant with medication, fair quality and quantity of sleep, and decreased appetite. Pt reports that his wife is primary support system.   Allowed pt to explore and express thoughts and feelings associated with current life stressors. Pt is concerned about health-related issues presently: has diabetes and also has cyclical vomiting that started in September 23, 2017.  Pt reports that the vomiting has impacted his life significantly--not able to do the things that bring him  happiness and pt had to leave job.   Pt reports that he has had on and off suicidal thoughts for years.  "Even though I control the thoughts I feel that it will eventually get the best of me".  Pt denies any plans or intent to follow through at time of assessment.   Explored thoughts and feelings about mother's death of covid in 2022/07/24 and father's sudden death in 09/23/2004. Pt feels that he didn't get closure from either death. Pt is sure that sister is the one that exposed mother to covid around christmas--has feelings about that and currently has limited contact with both siblings since mother's death.   Pt reports having very vivid nightmares recently--very violent dreams involving father and mother. Pt feels the dreams are so disturbing that he is fearful to go to sleep.   Discussed mindfulness activities and other relaxation techniques (pt uses an app on his phone with calming sounds).  Discussed grounding exercises and how they help with panic attacks. Allowed pt to gauge life balance and self-care activities, and discuss how to improve those. Developed treatment plan for depression, anxiety, trauma/grief/loss.   Suicidal/Homicidal: No  SI, HI, or AVH reported at time of session.  Therapist Response: Raymond Rose would like to continue developing mood management and anxiety management skills. Developed treatment plan.  Plan: Return again in 3 weeks. The ongoing treatment plan includes maintaining current levels of progress and continuing to build skills to manage mood, improve stress/anxiety management, trauma processing, emotion regulation, distress tolerance, and behavior modification.    Diagnosis: Axis I: Major depressive disorder, recurrent, moderate; Generalized anxiety disorder; bereavement    Axis II: none    Raymond Rose R Raymond Assefa, LCSW  03/10/2020  

## 2020-03-13 ENCOUNTER — Other Ambulatory Visit: Payer: Self-pay | Admitting: Family Medicine

## 2020-03-15 MED ORDER — OXYCODONE-ACETAMINOPHEN 7.5-325 MG PO TABS
1.0000 | ORAL_TABLET | ORAL | 0 refills | Status: DC | PRN
Start: 2020-03-15 — End: 2020-03-27

## 2020-03-27 ENCOUNTER — Other Ambulatory Visit: Payer: Self-pay | Admitting: Family Medicine

## 2020-03-29 ENCOUNTER — Ambulatory Visit (INDEPENDENT_AMBULATORY_CARE_PROVIDER_SITE_OTHER): Payer: 59 | Admitting: Licensed Clinical Social Worker

## 2020-03-29 ENCOUNTER — Other Ambulatory Visit: Payer: Self-pay

## 2020-03-29 DIAGNOSIS — F331 Major depressive disorder, recurrent, moderate: Secondary | ICD-10-CM

## 2020-03-29 DIAGNOSIS — F411 Generalized anxiety disorder: Secondary | ICD-10-CM | POA: Diagnosis not present

## 2020-03-29 MED ORDER — OXYCODONE-ACETAMINOPHEN 7.5-325 MG PO TABS
1.0000 | ORAL_TABLET | ORAL | 0 refills | Status: DC | PRN
Start: 2020-03-29 — End: 2020-04-06

## 2020-03-29 NOTE — Progress Notes (Signed)
Virtual Visit via Video Note  I connected with Raymond Rose on 03/29/20 at 10:00 AM EDT by a video enabled telemedicine application and verified that I am speaking with the correct person using two identifiers.  Location: Patient: home Provider: remote office [place], [place])   I discussed the limitations of evaluation and management by telemedicine and the availability of in person appointments. The patient expressed understanding and agreed to proceed.   I discussed the assessment and treatment plan with the patient. The patient was provided an opportunity to ask questions and all were answered. The patient agreed with the plan and demonstrated an understanding of the instructions.   The patient was advised to call back or seek an in-person evaluation if the symptoms worsen or if the condition fails to improve as anticipated.  I provided 45 minutes of non-face-to-face time during this encounter.   Maxemiliano Riel R Yukari Flax, LCSW    THERAPIST PROGRESS NOTE  Session Time: 10:00-10:45a  Participation Level: Active  Behavioral Response: CasualAlertDepressed  Type of Therapy: Individual Therapy  Treatment Goals addressed: Coping  Interventions: Supportive  Summary: Raymond Rose is a 44 y.o. male who presents with continuing symptoms related to depression/anxiety.  Pt reports that his mood seems more stable since starting on lithium. Pt reports continuing anxiety that is a newer symptom for him.   Allowed pt to explore and express thoughts and feelings associated with continuing job search. Pt reports that he has a couple of job interviews and feels "hopeful". Pt reports that he doesn't feel excited or happy--just hopeful.   Pt feeling relaxed after a recent vacation with wife--cruise. Pt enjoyed it and feels ready to start the job search again. Pt reports that when he is home, he often feels alone and "the reality of the situation kicks in".    Reviewed anxiety  management techniques including deep breathing, mindfulness, and meditation techniques to help manage anxiety.  Reviewed grounding techniques/deep breathing to help manage panic episodes.   Pt reports that he is still having cyclical vomiting multiple times per day. Episodes are not triggered by hunger or eating--happens randomly throughout the day.   Pt having some stress/family conflict over insensitive covid-related remarks made by a family member that pt took personally after losing mother to covid.   Suicidal/Homicidal: No  Therapist Response: Pt reports that his symptoms are the same--no improvement, no escalation.  This is indicative of intermittent progress.  Plan: Return again in 3 weeks. The ongoing treatment plan includes maintaining current levels of progress and continuing to build skills to manage mood, improve stress/anxiety management, emotion regulation, distress tolerance, and behavior modification.   Diagnosis: Axis I: Generalized Anxiety Disorder and Major Depression, Recurrent moderate    Axis II: Deferred    Kandice Schmelter R Raynesha Tiedt, LCSW 03/29/2020

## 2020-03-30 ENCOUNTER — Other Ambulatory Visit: Payer: Self-pay | Admitting: Family Medicine

## 2020-04-06 ENCOUNTER — Other Ambulatory Visit: Payer: Self-pay | Admitting: Family Medicine

## 2020-04-09 MED ORDER — OXYCODONE-ACETAMINOPHEN 7.5-325 MG PO TABS
1.0000 | ORAL_TABLET | ORAL | 0 refills | Status: DC | PRN
Start: 2020-04-09 — End: 2020-04-18

## 2020-04-11 ENCOUNTER — Encounter: Payer: Self-pay | Admitting: Psychiatry

## 2020-04-11 ENCOUNTER — Other Ambulatory Visit: Payer: Self-pay

## 2020-04-11 ENCOUNTER — Telehealth (INDEPENDENT_AMBULATORY_CARE_PROVIDER_SITE_OTHER): Payer: 59 | Admitting: Psychiatry

## 2020-04-11 ENCOUNTER — Telehealth: Payer: Self-pay

## 2020-04-11 DIAGNOSIS — F609 Personality disorder, unspecified: Secondary | ICD-10-CM | POA: Diagnosis not present

## 2020-04-11 DIAGNOSIS — F411 Generalized anxiety disorder: Secondary | ICD-10-CM

## 2020-04-11 DIAGNOSIS — F331 Major depressive disorder, recurrent, moderate: Secondary | ICD-10-CM

## 2020-04-11 DIAGNOSIS — Z79899 Other long term (current) drug therapy: Secondary | ICD-10-CM

## 2020-04-11 DIAGNOSIS — F4010 Social phobia, unspecified: Secondary | ICD-10-CM | POA: Diagnosis not present

## 2020-04-11 MED ORDER — LITHIUM CARBONATE 300 MG PO CAPS: 300.0000 mg | ORAL_CAPSULE | Freq: Two times a day (BID) | ORAL | 1 refills | Status: DC

## 2020-04-11 NOTE — Progress Notes (Signed)
Virtual Visit via Video Note  I connected with [place] Perra Beier on 04/11/20 at  2:00 PM EDT by a video enabled telemedicine application and verified that I am speaking with the correct person using two identifiers.  Location Provider Location : [facility] Patient Location : Home  Participants: Patient , Provider   I discussed the limitations of evaluation and management by telemedicine and the availability of in person appointments. The patient expressed understanding and agreed to proceed.     I discussed the assessment and treatment plan with the patient. The patient was provided an opportunity to ask questions and all were answered. The patient agreed with the plan and demonstrated an understanding of the instructions.   The patient was advised to call back or seek an in-person evaluation if the symptoms worsen or if the condition fails to improve as anticipated.   [place] [facility] MD OP Progress Note  04/11/2020 2:39 PM Richardo Popoff [place]  MRN:  [id number]  Chief Complaint:  Chief Complaint    Follow-up     HPI: Ethanjames Fontenot Milius is a 44 year old Caucasian male, currently unemployed, married, lives in [place], has a history of MDD, GAD, social anxiety disorder, sleep apnea, cyclical vomiting, diabetes melitis, erectile dysfunction was evaluated by telemedicine today.  Patient today reports since starting the lithium he has noticed an improvement in his depressive symptoms.  He reports he has no side effects to the lithium.  He has been taking it regularly.  He continues to have chronic suicidal thoughts at the back of his mind however since being on the lithium he reports that has definitely improved.  He denies any active thoughts or plan at this time.  He reports sleep is good.  Patient continues to have GI problems however since being on medications for his diabetes he reports that has improved.  Patient has had psychotherapy visit with his new therapist and reports that is going well.   He is motivated to keep his therapy sessions.  Patient today is interested in increasing the dosage of lithium.  Patient denies any other concerns today.  Visit Diagnosis:    ICD-10-CM   1. MDD (major depressive disorder), recurrent episode, moderate (HCC)  F33.1 lithium carbonate 300 MG capsule  2. GAD (generalized anxiety disorder)  F41.1   3. Social anxiety disorder  F40.10   4. Personality disorder, unspecified ([facility])  F60.9   5. High risk medication use  Z79.899 Lithium level    Basic metabolic panel    Past Psychiatric History: I have reviewed past psychiatric history from my progress note on 04/21/2019.  Past trials of Wellbutrin, Lexapro, Elavil, Effexor, Zyprexa, Seroquel  Past Medical History:  Past Medical History:  Diagnosis Date  . Anxiety   . Arthritis   . COVID-19 06/2019  . Cyclical vomiting   . Depression   . Diabetes mellitus, type II ([place])   . OSA on CPAP   . Vertigo 01/03/2015    Past Surgical History:  Procedure Laterality Date  . 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 CHOLECYSTECTOMY  2010   lap Cholecystectomy  . 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 CT scan of Brain  12/23/2011   without contrast, [facility], Normal  . TONSILLECTOMY  1985    Family Psychiatric History: I have reviewed family psychiatric history from my progress note on 04/21/2019.  Family History:  Family History  Problem Relation Age of Onset  . Depression Mother   . Heart attack Father 84  . Arthritis Other   . Alcohol abuse Other   . Lung cancer Other   . Hyperlipidemia Other   . CAD Other   . Stroke Other   . Hypertension Other   . Bipolar disorder Other     Social History: I have reviewed social history from my progress note on 04/21/2019 Social History   Socioeconomic History  . Marital status: Married    Spouse name: Not on file  . Number of children: Not on file  . Years of  education: Not on file  . Highest education level: Not on file  Occupational History  . Occupation: Banking    Comment: Engineer, manufacturing  Tobacco Use  . Smoking status: Former Smoker    Years: 5.00    Quit date: 06/17/1997    Years since quitting: 22.8  . Smokeless tobacco: Never Used  . Tobacco comment: started smoking at ahe 16, and quit at age 42  Vaping Use  . Vaping Use: Never used  Substance and Sexual Activity  . Alcohol use: Yes    Alcohol/week: 0.0 standard drinks    Comment: occasional alcohol use  . Drug use: No  . Sexual activity: Not on file  Other Topics Concern  . Not on file  Social History Narrative  . Not on file   Social Determinants of Health   Financial Resource Strain:   . Difficulty of Paying Living Expenses: Not on file  Food Insecurity:   . Worried About Charity fundraiser in the Last Year: Not on file  . Ran Out of Food in the Last Year: Not on file  Transportation Needs:   . Lack of Transportation (Medical): Not on file  . Lack of Transportation (Non-Medical): Not on file  Physical Activity:   . Days of Exercise per Week: Not on file  . Minutes of Exercise per Session: Not on file  Stress:   . Feeling of Stress : Not on file  Social Connections:   . Frequency of Communication with Friends and Family: Not on file  . Frequency of Social Gatherings with Friends and Family: Not on file  . Attends Religious Services: Not on file  . Active Member of Clubs or Organizations: Not on file  . Attends Archivist Meetings: Not on file  . Marital Status: Not on file    Allergies:  Allergies  Allergen Reactions  . Bupropion     Extreme anxiety  . Morphine     Metabolic Disorder Labs: Lab Results  Component Value Date   HGBA1C 10.1 (H) 01/23/2020   MPG 243.17 01/23/2020   No results found for: PROLACTIN Lab Results  Component Value Date   CHOL 173 05/24/2019   TRIG 718 (HH) 05/24/2019   HDL 30 (L) 05/24/2019   CHOLHDL 5.8 (H)  05/24/2019   LDLCALC 42 05/24/2019   [facility] Comment 10/30/2018   Lab Results  Component Value Date   TSH 1.360 05/24/2019   TSH 1.570 01/24/2017    Therapeutic Level Labs: Lab Results  Component Value Date   LITHIUM 0.15 (L) 03/07/2020   No results found for: VALPROATE No components found for:  CBMZ  Current Medications: Current Outpatient Medications  Medication Sig Dispense Refill  . ALPRAZolam (XANAX) 0.5 MG tablet TAKE 1 TABLET (0.5 MG TOTAL) BY MOUTH EVERY 4 (FOUR) HOURS AS NEEDED. FOR ANXIETY 16 tablet 1  . blood  glucose meter kit and supplies KIT Dispense based on patient and insurance preference. Use up to four times daily as directed. (FOR ICD-9 250.00, 250.01). 1 each 0  . insulin aspart (NOVOLOG FLEXPEN) 100 UNIT/ML FlexPen Inject up to 10 units three times daily as directed by physician 15 mL 5  . Insulin Pen Needle 32G X 4 MM MISC 062 applicators by Does not apply route 4 (four) times daily. 100 each 3  . ketoconazole (NIZORAL) 2 % cream APPLY TO AFFECTED AREA ONCE DAILY FOR 7 DAYS AS NEEDED (Patient taking differently: Apply 1 application topically daily. APPLY TO AFFECTED AREA ONCE DAILY FOR 7 DAYS AS NEEDED) 15 g 3  . lamoTRIgine (LAMICTAL) 150 MG tablet Take 1 tablet (150 mg total) by mouth daily. 90 tablet 0  . LEVEMIR FLEXTOUCH 100 UNIT/ML FlexPen Inject 28 Units into the skin daily. 15 mL 5  . metFORMIN (GLUCOPHAGE-XR) 500 MG 24 hr tablet TAKE 2 TABLETS BY MOUTH TWICE A DAY 120 tablet 3  . omeprazole (PRILOSEC) 40 MG capsule TAKE 1 CAPSULE BY MOUTH EVERY DAY 30 capsule 0  . ondansetron (ZOFRAN) 4 MG tablet Take 1 tablet (4 mg total) by mouth daily as needed for nausea or vomiting. 30 tablet 1  . oxyCODONE-acetaminophen (PERCOCET) 7.5-325 MG tablet Take 1 tablet by mouth every 4 (four) hours as needed. 60 tablet 0  . predniSONE (DELTASONE) 10 MG tablet PLEASE SEE ATTACHED FOR DETAILED DIRECTIONS    . promethazine (PHENERGAN) 25 MG tablet TAKE 1 TABLET (25 MG  TOTAL) BY MOUTH EVERY 6 (SIX) HOURS AS NEEDED FOR NAUSEA OR VOMITING. 20 tablet 1  . rosuvastatin (CRESTOR) 20 MG tablet TAKE 1 TABLET BY MOUTH EVERY DAY (Patient taking differently: Take 20 mg by mouth daily. ) 30 tablet 12  . sildenafil (VIAGRA) 50 MG tablet Take 1-2 tablets (50-100 mg total) by mouth daily as needed. 90 tablet 3  . tamsulosin (FLOMAX) 0.4 MG CAPS capsule Take 0.4 mg by mouth daily.    . TRULICITY 1.5 IR/4.8NI SOPN SMARTSIG:0.5 Milliliter(s) SUB-Q Once a Week    . Vilazodone HCl (VIIBRYD) 40 MG TABS Take 1 tablet (40 mg total) by mouth daily. 90 tablet 0  . lithium carbonate 300 MG capsule Take 1 capsule (300 mg total) by mouth 2 (two) times daily with a meal. 60 capsule 1  . potassium chloride SA (KLOR-CON) 20 MEQ tablet Take 2 tablets (40 mEq total) by mouth daily for 3 days. 6 tablet 0   No current facility-administered medications for this visit.     Musculoskeletal: Strength & Muscle Tone: UTA Gait & Station: normal Patient leans: N/A  Psychiatric Specialty Exam: Review of Systems  Psychiatric/Behavioral: Positive for dysphoric mood.  All other systems reviewed and are negative.   There were no vitals taken for this visit.There is no height or weight on file to calculate BMI.  General Appearance: Casual  Eye Contact:  Fair  Speech:  Clear and Coherent  Volume:  Normal  Mood:  Dysphoric  Affect:  Congruent  Thought Process:  Goal Directed and Descriptions of Associations: Intact  Orientation:  Full (Time, Place, and Person)  Thought Content: Logical   Suicidal Thoughts:  No  Homicidal Thoughts:  No  Memory:  Immediate;   Fair Recent;   Fair Remote;   Fair  Judgement:  Fair  Insight:  Fair  Psychomotor Activity:  Normal  Concentration:  Concentration: Fair and Attention Span: Fair  Recall:  [employer] of Knowledge: Fair  Language: Fair  Akathisia:  No  Handed:  Right  AIMS (if indicated): uta  Assets:  Communication Skills Desire for  Improvement Housing Social Support  ADL's:  Intact  Cognition: WNL  Sleep:  Fair   Screenings: GAD-7     Counselor from 03/10/2020 in [facility] Office Visit from 04/16/2019 in [facility]  Total GAD-7 Score 13 20    PHQ2-9     Counselor from 03/10/2020 in [facility] Video Visit from 10/13/2019 in [facility] Office Visit from 08/23/2019 in [facility] Visit from 04/16/2019 in [facility] Visit from 02/10/2018 in [facility]  PHQ-2 Total Score _0 PHQ-9 Total Score _1 Assessment and Plan: Khameron Gruenwald Slayton is a 44 year old Caucasian male, unemployed, lives in [place], has a history of MDD, OSA on CPAP, diabetes melitis, arthritis was evaluated by telemedicine today.  Patient is biologically predisposed given his history of trauma, family history of mental health problems.  Patient is currently making progress on the lithium however is interested in dosage increase to address his mood symptoms  Plan as noted below.  Plan MDD-improving Viibryd 40 mg p.o. daily Lamictal 150 mg p.o. daily Increase lithium to 300 mg p.o. twice daily. If patient continues to make progress on lithium we will taper him off of the Lamictal.   GAD-improving Continue CBT with Ms. Sutton Hussami Patient is also on Xanax as needed per PMD.  He has been limiting use  Social anxiety disorder-improving Continue CBT  Personality disorder unspecified-continue CBT  High risk medication use-I have reviewed lithium level dated 03/07/2020-discussed with patient-0.15-subtherapeutic. Will order lithium level, BMP-patient will go to [facility] lab after taking the lithium for 5 days.  Discussed [employer] disability evaluation form on 04/04/2020.   Follow-up in clinic in 3 to 4 weeks or sooner if needed.  I have spent atleast 30 minutes  face to face by video with patient today. More than 50 % of the time was spent for preparing to see the patient ( e.g., review of test, records ), obtaining and to review and separately obtained history , ordering medications and test ,psychoeducation and supportive psychotherapy and care coordination,as well as documenting clinical information in electronic health record,interpreting and communication of test results. This note was generated in part or whole with voice recognition software. Voice recognition is usually quite accurate but there are transcription errors that can and very often do occur. I apologize for any typographical errors that were not detected and corrected.       Ursula Alert, MD 04/11/2020, 2:39 PM

## 2020-04-11 NOTE — Telephone Encounter (Signed)
faxed and confirmed labwork order sent to [facility] lab. order for lithium level and bmp dx z79.899

## 2020-04-12 ENCOUNTER — Ambulatory Visit: Payer: Self-pay | Admitting: Family Medicine

## 2020-04-17 ENCOUNTER — Telehealth: Payer: Self-pay

## 2020-04-17 NOTE — Telephone Encounter (Signed)
Appointment reschedule - Patient called stating he needs to reschedule his therapy appointment from 04/18/20 at 11:00am.  Requested call back to cancel that appt and to reschedule.

## 2020-04-17 NOTE — Telephone Encounter (Signed)
Done pt called , canceled appt for tomorrow and r/s

## 2020-04-18 ENCOUNTER — Ambulatory Visit (INDEPENDENT_AMBULATORY_CARE_PROVIDER_SITE_OTHER): Payer: Self-pay | Admitting: Family Medicine

## 2020-04-18 ENCOUNTER — Ambulatory Visit
Admission: RE | Admit: 2020-04-18 | Discharge: 2020-04-18 | Disposition: A | Discharge status: Home / Self Care | Payer: 59 | Source: Ambulatory Visit | Attending: Family Medicine | Admitting: Family Medicine

## 2020-04-18 ENCOUNTER — Other Ambulatory Visit
Admission: RE | Admit: 2020-04-18 | Discharge: 2020-04-18 | Disposition: A | Discharge status: Home / Self Care | Payer: 59 | Source: Home / Self Care | Attending: Psychiatry | Admitting: Psychiatry

## 2020-04-18 ENCOUNTER — Other Ambulatory Visit: Payer: Self-pay | Admitting: Family Medicine

## 2020-04-18 ENCOUNTER — Ambulatory Visit: Payer: 59 | Admitting: Licensed Clinical Social Worker

## 2020-04-18 ENCOUNTER — Other Ambulatory Visit: Payer: Self-pay

## 2020-04-18 ENCOUNTER — Ambulatory Visit
Admission: RE | Admit: 2020-04-18 | Discharge: 2020-04-18 | Disposition: A | Discharge status: Home / Self Care | Payer: 59 | Attending: Family Medicine | Admitting: Family Medicine

## 2020-04-18 ENCOUNTER — Encounter: Payer: Self-pay | Admitting: Family Medicine

## 2020-04-18 VITALS — BP 112/75 | HR 83 | Temp 97.8°F | Resp 16 | Wt 310.0 lb

## 2020-04-18 DIAGNOSIS — G8929 Other chronic pain: Secondary | ICD-10-CM | POA: Diagnosis present

## 2020-04-18 DIAGNOSIS — M25552 Pain in left hip: Secondary | ICD-10-CM | POA: Insufficient documentation

## 2020-04-18 DIAGNOSIS — M25551 Pain in right hip: Secondary | ICD-10-CM | POA: Insufficient documentation

## 2020-04-18 DIAGNOSIS — Z79899 Other long term (current) drug therapy: Secondary | ICD-10-CM

## 2020-04-18 DIAGNOSIS — M545 Low back pain, unspecified: Secondary | ICD-10-CM | POA: Insufficient documentation

## 2020-04-18 DIAGNOSIS — F1190 Opioid use, unspecified, uncomplicated: Secondary | ICD-10-CM

## 2020-04-18 LAB — BASIC METABOLIC PANEL
Anion gap: 10 (ref 5–15)
BUN: 25 mg/dL — ABNORMAL HIGH (ref 6–20)
CO2: 29 mmol/L (ref 22–32)
Calcium: 10.2 mg/dL (ref 8.9–10.3)
Chloride: 100 mmol/L (ref 98–111)
Creatinine, Ser: 0.77 mg/dL (ref 0.61–1.24)
GFR, Estimated: >60 mL/min (ref >60)
Glucose, Bld: 199 mg/dL — ABNORMAL HIGH (ref 70–99)
Potassium: 4.3 mmol/L (ref 3.5–5.1)
Sodium: 139 mmol/L (ref 135–145)

## 2020-04-18 LAB — LITHIUM LEVEL: Lithium Lvl: 0.22 mmol/L — ABNORMAL LOW (ref 0.60–1.20)

## 2020-04-18 NOTE — Progress Notes (Signed)
Established patient visit   Patient: Raymond Rose   DOB: April 22, 1976   44 y.o. Male  MRN: [id number] Visit Date: 04/18/2020  Today's healthcare provider: [name], MD   Chief Complaint  Patient presents with  . Pain Management   Subjective    HPI  Diabetes Mellitus Type II, Follow-up  Lab Results  Component Value Date   HGBA1C 10.1 (H) 01/23/2020   HGBA1C 9.7 (A) 01/10/2020   HGBA1C 7.9 (A) 08/23/2019   Wt Readings from Last 3 Encounters:  04/18/20 (!) 310 lb (140.6 kg)  02/08/20 (!) 322 lb (146.1 kg)  02/06/20 300 lb (136.1 kg)   Last seen for diabetes 3 months ago.  Management since then includes referring to Endocrinology. Patient was seen by [facility] on 02/18/2020. His next appointment with Endocrinology is scheduled for 04/21/2020. He reports good compliance with treatment. He is not having side effects.  Symptoms: No fatigue No foot ulcerations  No appetite changes No nausea  No paresthesia of the feet  No polydipsia  No polyuria No visual disturbances   No vomiting     Pertinent Labs: Lab Results  Component Value Date   CHOL 173 05/24/2019   HDL 30 (L) 05/24/2019   LDLCALC 42 05/24/2019   TRIG 718 (HH) 05/24/2019   CHOLHDL 5.8 (H) 05/24/2019   Lab Results  Component Value Date   NA 137 02/06/2020   K 3.8 02/06/2020   CREATININE 0.79 02/06/2020   GFRNONAA >60 02/06/2020   GFRAA >60 02/06/2020   GLUCOSE 300 (H) 02/06/2020     ---------------------------------------------------------------------------------------------------   Follow up for Chronic pain:   The patient was last seen for this 3 months ago. Changes made at last visit include none; continue same medication.  He reports good compliance with treatment. He feels that condition is Worse. Patient is having more pain in his hips and back. He is not having side effects.  He is currently take 2 oxycodone/apap 7.5/325 three times every day. His back  pain is not bothering him too much, but is having more pain in the back of his hips for the last several months. Pain is worse when he is walking, but continues every when sitting. He wants to increase dose of oxycodone/apap    -----------------------------------------------------------------------------------------     Medications: Outpatient Medications Prior to Visit  Medication Sig  . ALPRAZolam (XANAX) 0.5 MG tablet TAKE 1 TABLET (0.5 MG TOTAL) BY MOUTH EVERY 4 (FOUR) HOURS AS NEEDED. FOR ANXIETY  . blood glucose meter kit and supplies KIT Dispense based on patient and insurance preference. Use up to four times daily as directed. (FOR ICD-9 250.00, 250.01).  . insulin aspart (NOVOLOG FLEXPEN) 100 UNIT/ML FlexPen Inject up to 10 units three times daily as directed by physician  . Insulin Pen Needle 32G X 4 MM MISC 563 applicators by Does not apply route 4 (four) times daily.  Marland Kitchen ketoconazole (NIZORAL) 2 % cream APPLY TO AFFECTED AREA ONCE DAILY FOR 7 DAYS AS NEEDED (Patient taking differently: Apply 1 application topically daily. APPLY TO AFFECTED AREA ONCE DAILY FOR 7 DAYS AS NEEDED)  . lamoTRIgine (LAMICTAL) 150 MG tablet Take 1 tablet (150 mg total) by mouth daily.  Marland Kitchen LEVEMIR FLEXTOUCH 100 UNIT/ML FlexPen Inject 28 Units into the skin daily.  Marland Kitchen lithium carbonate 300 MG capsule Take 1 capsule (300 mg total) by mouth 2 (two) times daily with a meal. (Patient taking differently: Take 600 mg by mouth 2 (two) times  daily with a meal. )  . metFORMIN (GLUCOPHAGE-XR) 500 MG 24 hr tablet TAKE 2 TABLETS BY MOUTH TWICE A DAY  . omeprazole (PRILOSEC) 40 MG capsule TAKE 1 CAPSULE BY MOUTH EVERY DAY  . ondansetron (ZOFRAN) 4 MG tablet Take 1 tablet (4 mg total) by mouth daily as needed for nausea or vomiting.  Marland Kitchen oxyCODONE-acetaminophen (PERCOCET) 7.5-325 MG tablet Take 1 tablet by mouth every 4 (four) hours as needed.  . predniSONE (DELTASONE) 10 MG tablet PLEASE SEE ATTACHED FOR DETAILED DIRECTIONS  .  promethazine (PHENERGAN) 25 MG tablet TAKE 1 TABLET (25 MG TOTAL) BY MOUTH EVERY 6 (SIX) HOURS AS NEEDED FOR NAUSEA OR VOMITING.  . rosuvastatin (CRESTOR) 20 MG tablet TAKE 1 TABLET BY MOUTH EVERY DAY (Patient taking differently: Take 20 mg by mouth daily. )  . sildenafil (VIAGRA) 50 MG tablet Take 1-2 tablets (50-100 mg total) by mouth daily as needed.  . tamsulosin (FLOMAX) 0.4 MG CAPS capsule Take 0.4 mg by mouth daily.  . TRULICITY 1.5 BB/0.4UG SOPN SMARTSIG:0.5 Milliliter(s) SUB-Q Once a Week  . Vilazodone HCl (VIIBRYD) 40 MG TABS Take 1 tablet (40 mg total) by mouth daily.  . [DISCONTINUED] potassium chloride SA (KLOR-CON) 20 MEQ tablet Take 2 tablets (40 mEq total) by mouth daily for 3 days.   No facility-administered medications prior to visit.    Review of Systems  Constitutional: Negative for appetite change, chills and fever.  Respiratory: Negative for chest tightness, shortness of breath and wheezing.   Cardiovascular: Negative for chest pain and palpitations.  Gastrointestinal: Negative for abdominal pain, nausea and vomiting.  Musculoskeletal: Positive for arthralgias and back pain.      Objective    BP 112/75 (BP Location: Right Arm, Patient Position: Sitting, Cuff Size: Large)   Pulse 83   Temp 97.8 F (36.6 C) (Oral)   Resp 16   Wt (!) 310 lb (140.6 kg)   BMI 40.90 kg/m    Physical Exam   General appearance: Severely obese male, cooperative and in no acute distress Head: Normocephalic, without obvious abnormality, atraumatic MS: Tender bilateral posterior hips over sacral iliac joints.  Psych: Appropriate mood and affect. Neurologic: Mental status: Alert, oriented to person, place, and time, thought content appropriate.   No results found for any visits on 04/18/20.  Assessment & Plan     1. Chronic hip pain, bilateral Seems to be focused over sacral-iliac spine - DG Lumbar Spine Complete; Future - DG HIP UNILAT WITH PELVIS 2-3 VIEWS LEFT; Future - DG  HIP UNILAT WITH PELVIS 2-3 VIEWS RIGHT; Future He requests increase in dose of oxycodone/apap but is already taking 90 MME/day. Consider orthopedic referral after reviewing Xrays.   2. Chronic low back pain, unspecified back pain laterality, unspecified whether sciatica present  - Pain Mgt Scrn (14 Drugs), Ur - DG Lumbar Spine Complete; Future  3. Chronic, continuous use of opioids  - Pain Mgt Scrn (14 Drugs), Ur   Is scheduled to follow up with endocrinology later this week.       The entirety of the information documented in the History of Present Illness, Review of Systems and Physical Exam were personally obtained by me. Portions of this information were initially documented by the CMA and reviewed by me for thoroughness and accuracy.      [name], MD  [facility] [phone] (phone) [phone] (fax)  [place]

## 2020-04-18 NOTE — Addendum Note (Signed)
Addended by: Lattie Haw on: 04/18/2020 10:47 AM   Modules accepted: Orders

## 2020-04-18 NOTE — Patient Instructions (Signed)
.   Please review the attached list of medications and notify my office if there are any errors.   Go to the [facility] on [facility] for hip and back Xrays  . Covid-19 vaccines: The Covid vaccines have been given to hundreds of millions of people and found to be very effective and are as safe as any other vaccine.  The [employer] vaccine has been associated with very rare dangerous blood clots, but only in adult women under the age of 29.  The risk of dying from Covid infections is much higher than having a serious reaction to the vaccine.  I strongly recommend getting fully vaccinated against Covid-19.  I recommend that adult women under 60 get fully vaccinated, but the [place] vaccines may be safer for those women than the [employer] vaccine.

## 2020-04-19 MED ORDER — OXYCODONE-ACETAMINOPHEN 7.5-325 MG PO TABS: 1.0000 | ORAL_TABLET | ORAL | 0 refills | Status: DC | PRN

## 2020-04-20 ENCOUNTER — Telehealth: Payer: Self-pay

## 2020-04-20 LAB — PAIN MGT SCRN (14 DRUGS), UR
Amphetamine Scrn, Ur: NEGATIVE ng/mL
BARBITURATE SCREEN URINE: NEGATIVE ng/mL
BENZODIAZEPINE SCREEN, URINE: NEGATIVE ng/mL
Buprenorphine, Urine: NEGATIVE ng/mL
CANNABINOIDS UR QL SCN: NEGATIVE ng/mL
Cocaine (Metab) Scrn, Ur: NEGATIVE ng/mL
Creatinine(Crt), U: 145.6 mg/dL (ref 20.0–300.0)
Fentanyl, Urine: NEGATIVE pg/mL
Meperidine Screen, Urine: NEGATIVE ng/mL
Methadone Screen, Urine: NEGATIVE ng/mL
OXYCODONE+OXYMORPHONE UR QL SCN: POSITIVE ng/mL — AB
Opiate Scrn, Ur: NEGATIVE ng/mL
Ph of Urine: 5.4 (ref 4.5–8.9)
Phencyclidine Qn, Ur: NEGATIVE ng/mL
Propoxyphene Scrn, Ur: NEGATIVE ng/mL
Tramadol Screen, Urine: NEGATIVE ng/mL

## 2020-04-20 NOTE — Telephone Encounter (Signed)
See result note.  

## 2020-04-20 NOTE — Telephone Encounter (Signed)
Tried calling patient.Unable to leave message due to voice mailbox full. Will try calling back later. OK for [facility] triage to advise of results if patient returns call.

## 2020-04-20 NOTE — Telephone Encounter (Signed)
-----   Message from Birdie Sons, MD sent at 04/19/2020  8:51 PM EDT ----- Raymond Rose shows some arthritis in spine, hips are normal. I think is hip pain is coming for the SI joint (the joint between hip and spine. The orthopedist can usually treat this with joint injections and sometimes physical therapy. Recommend referral to orthopedics.

## 2020-04-21 ENCOUNTER — Telehealth: Payer: Self-pay

## 2020-04-21 DIAGNOSIS — G8929 Other chronic pain: Secondary | ICD-10-CM

## 2020-04-21 DIAGNOSIS — M47819 Spondylosis without myelopathy or radiculopathy, site unspecified: Secondary | ICD-10-CM

## 2020-04-21 DIAGNOSIS — M25559 Pain in unspecified hip: Secondary | ICD-10-CM

## 2020-04-21 NOTE — Telephone Encounter (Signed)
-----   Message from Birdie Sons, MD sent at 04/19/2020  8:51 PM EDT ----- Hoover Brunette shows some arthritis in spine, hips are normal. I think is hip pain is coming for the SI joint (the joint between hip and spine. The orthopedist can usually treat this with joint injections and sometimes physical therapy. Recommend referral to orthopedics.

## 2020-04-21 NOTE — Telephone Encounter (Signed)
Patient agrees to referral. Which dx code should I use to associate with the referral?

## 2020-04-21 NOTE — Telephone Encounter (Signed)
[place], RN  04/20/2020 4:28 PM EDT Back to Top    Phone call to pt. Advised of result note per Dr. Caryn Section from 04/19/20. Pt. Verb. Understanding. Stated he was in agreement with being referred to Orthopedics. Will send message to Dr. Caryn Section re: this.   Randal Buba, [facility]  04/20/2020 10:06 AM EDT     Tried calling patient.Unable to leave message due to voice mailbox full. Will try calling back later. OK for [facility] triage to advise of results if patient returns call.

## 2020-04-26 ENCOUNTER — Other Ambulatory Visit: Payer: Self-pay | Admitting: Family Medicine

## 2020-04-26 DIAGNOSIS — F419 Anxiety disorder, unspecified: Secondary | ICD-10-CM

## 2020-04-26 NOTE — Telephone Encounter (Signed)
Requested medication (s) are due for refill today: yes  Requested medication (s) are on the active medication list: yes  Last refill:  03/09/20 #16 1 refill  Future visit scheduled: yes  Notes to clinic:  not delegated per protocol     Requested Prescriptions  Pending Prescriptions Disp Refills   ALPRAZolam (XANAX) 0.5 MG tablet [Pharmacy Med Name: ALPRAZOLAM 0.5 MG TABLET] 16 tablet 1    Sig: TAKE 1 TABLET (0.5 MG TOTAL) BY MOUTH EVERY 4 (FOUR) HOURS AS NEEDED. FOR ANXIETY      Not Delegated - Psychiatry:  Anxiolytics/Hypnotics Failed - 04/26/2020  6:03 PM      Failed - This refill cannot be delegated      Failed - Urine Drug Screen completed in last 360 days      Passed - Valid encounter within last 6 months    Recent Outpatient Visits           1 week ago Chronic hip pain, bilateral   [facility] Birdie Sons, MD   2 months ago Type 2 diabetes mellitus with ketoacidosis without coma, without long-term current use of insulin [facility])   [facility] Birdie Sons, MD   2 months ago Weakness   [facility] Fenton Malling M, [place]   3 months ago Type 2 diabetes mellitus without complication, without long-term current use of insulin [facility])   [facility] Birdie Sons, MD   6 months ago Nausea vomiting and diarrhea   [facility] Birdie Sons, MD

## 2020-04-28 ENCOUNTER — Ambulatory Visit: Payer: 59 | Admitting: Licensed Clinical Social Worker

## 2020-04-29 ENCOUNTER — Other Ambulatory Visit: Payer: Self-pay | Admitting: Family Medicine

## 2020-04-29 NOTE — Telephone Encounter (Signed)
Requested Prescriptions  Pending Prescriptions Disp Refills  . omeprazole (PRILOSEC) 40 MG capsule [Pharmacy Med Name: OMEPRAZOLE DR 40 MG CAPSULE] 30 capsule 0    Sig: TAKE 1 CAPSULE BY MOUTH EVERY DAY     Gastroenterology: Proton Pump Inhibitors Passed - 04/29/2020  9:12 AM      Passed - Valid encounter within last 12 months    Recent Outpatient Visits          1 week ago Chronic hip pain, bilateral   [facility] Birdie Sons, MD   2 months ago Type 2 diabetes mellitus with ketoacidosis without coma, without long-term current use of insulin [facility])   [facility] Birdie Sons, MD   3 months ago Weakness   [facility] Fenton Malling M, [place]   3 months ago Type 2 diabetes mellitus without complication, without long-term current use of insulin [facility])   [facility] Birdie Sons, MD   6 months ago Nausea vomiting and diarrhea   [facility] Birdie Sons, MD

## 2020-05-01 MED ORDER — OXYCODONE-ACETAMINOPHEN 7.5-325 MG PO TABS: 1.0000 | ORAL_TABLET | ORAL | 0 refills | Status: DC | PRN

## 2020-05-03 ENCOUNTER — Encounter: Payer: Self-pay | Admitting: Family Medicine

## 2020-05-05 ENCOUNTER — Ambulatory Visit: Payer: Self-pay | Admitting: Licensed Clinical Social Worker

## 2020-05-05 ENCOUNTER — Telehealth: Payer: Self-pay

## 2020-05-05 ENCOUNTER — Encounter: Payer: Self-pay | Admitting: Family Medicine

## 2020-05-05 NOTE — Telephone Encounter (Signed)
Copied from [place] [phone]. Topic: General - Inquiry >> May 05, 2020 10:37 AM Gillis Ends D wrote: Reason for CRM: Patient called and stated that the pharmacy had sent over a prior authorization for his pain medication (Oxycodone)  and needs it completed. He would like to be notified once the prior Josem Kaufmann is done because he is completely out of the medication. He can be reached at [phone]. Please advise

## 2020-05-05 NOTE — Telephone Encounter (Signed)
Please review. Thanks!  

## 2020-05-08 NOTE — Telephone Encounter (Signed)
I contacted patients insurance and was advised that a PA for this medication has been completed and was approved on 05/06/2020 through 08/04/2020.

## 2020-05-09 ENCOUNTER — Telehealth (INDEPENDENT_AMBULATORY_CARE_PROVIDER_SITE_OTHER): Payer: 59 | Admitting: Psychiatry

## 2020-05-09 ENCOUNTER — Other Ambulatory Visit: Payer: Self-pay

## 2020-05-09 ENCOUNTER — Encounter: Payer: Self-pay | Admitting: Psychiatry

## 2020-05-09 DIAGNOSIS — F411 Generalized anxiety disorder: Secondary | ICD-10-CM

## 2020-05-09 DIAGNOSIS — F4010 Social phobia, unspecified: Secondary | ICD-10-CM

## 2020-05-09 DIAGNOSIS — F331 Major depressive disorder, recurrent, moderate: Secondary | ICD-10-CM | POA: Diagnosis not present

## 2020-05-09 DIAGNOSIS — F609 Personality disorder, unspecified: Secondary | ICD-10-CM | POA: Diagnosis not present

## 2020-05-09 DIAGNOSIS — Z79899 Other long term (current) drug therapy: Secondary | ICD-10-CM

## 2020-05-09 MED ORDER — HYDROXYZINE PAMOATE 50 MG PO CAPS: 50.0000 mg | ORAL_CAPSULE | Freq: Every evening | ORAL | 1 refills | Status: DC | PRN

## 2020-05-09 MED ORDER — VILAZODONE HCL 40 MG PO TABS: 40.0000 mg | ORAL_TABLET | Freq: Every day | ORAL | 0 refills | Status: DC

## 2020-05-09 MED ORDER — LAMOTRIGINE 150 MG PO TABS: 150.0000 mg | ORAL_TABLET | Freq: Every day | ORAL | 0 refills | Status: DC

## 2020-05-09 NOTE — Progress Notes (Signed)
Virtual Visit via Video Note  I connected with Raymond Rose on 05/09/20 at  1:30 PM EST by a video enabled telemedicine application and verified that I am speaking with the correct person using two identifiers.  Location Provider Location : [facility] Patient Location : Home  Participants: Patient , Provider    I discussed the limitations of evaluation and management by telemedicine and the availability of in person appointments. The patient expressed understanding and agreed to proceed.   I discussed the assessment and treatment plan with the patient. The patient was provided an opportunity to ask questions and all were answered. The patient agreed with the plan and demonstrated an understanding of the instructions.   The patient was advised to call back or seek an in-person evaluation if the symptoms worsen or if the condition fails to improve as anticipated.   [place] MD OP Progress Note  05/09/2020 2:10 PM Raymond Rose  MRN:  [id number]  Chief Complaint:  Chief Complaint    Follow-up     HPI: Raymond Rose is a 44 year old Caucasian male, currently unemployed, married, lives in [place], has a history of MDD, GAD, social anxiety disorder, sleep apnea, cyclical vomiting, diabetes melitis, erectile dysfunction was evaluated by telemedicine today.  Patient today reports he is currently compliant on his medications.  The lithium is beneficial.  He however reports the last 2 weeks he has been feeling overwhelmed more so because of attending several interviews.  He reports his disability got denied.  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  Patient however reports his cyclical vomiting continues to affect him on a daily basis and nobody has been able to figure out what could be going on.  That does worry him.  Patient reports sleep is restless the past few weeks because of racing thoughts and having  nightmares.  Patient denies any suicidality at this time however does have a history of chronic suicidal thoughts.  Patient reports when he gets rejections from interviews that he attends he does feel like he does not want to be here anymore since he will not have health insurance at the beginning of the year.  He however reports his wife continues to be very supportive and she is able to talk him down and that does help him to have a better perception.  He does report that he is motivated to stay in therapy.  He does have upcoming appointment coming up with his therapist.  Patient denies any other concerns today.  Visit Diagnosis:    ICD-10-CM   1. MDD (major depressive disorder), recurrent episode, moderate (HCC)  F33.1 hydrOXYzine (VISTARIL) 50 MG capsule  2. GAD (generalized anxiety disorder)  F41.1 Vilazodone HCl (VIIBRYD) 40 MG TABS    lamoTRIgine (LAMICTAL) 150 MG tablet  3. Social anxiety disorder  F40.10   4. Personality disorder, unspecified ([place])  F60.9   5. High risk medication use  Z79.899 BUN+Creat    Past Psychiatric History: I have reviewed past psychiatric history from my progress note on 04/21/2019.  Past trials of Wellbutrin, Lexapro, Elavil, Effexor, Zyprexa, Seroquel  Past Medical History:  Past Medical History:  Diagnosis Date  . Anxiety   . Arthritis   . COVID-19 06/2019  . Cyclical vomiting   . Depression   . OSA on CPAP   . Vertigo 01/03/2015    Past Surgical History:  Procedure Laterality Date  . BONE  TUMOR EXCISION  1992   left arm  . CHOLECYSTECTOMY  2010   lap Cholecystectomy  . 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 CT scan of Brain  12/23/2011   without contrast, [facility], Normal  . TONSILLECTOMY  1985    Family Psychiatric History: I have reviewed family psychiatric history from my progress note on 04/21/2019  Family History:  Family History  Problem Relation  Age of Onset  . Depression Mother   . Heart attack Father 21  . Arthritis Other   . Alcohol abuse Other   . Lung cancer Other   . Hyperlipidemia Other   . CAD Other   . Stroke Other   . Hypertension Other   . Bipolar disorder Other     Social History: I have reviewed social history from my progress note on 04/21/2019 Social History   Socioeconomic History  . Marital status: Married    Spouse name: Not on file  . Number of children: Not on file  . Years of education: Not on file  . Highest education level: Not on file  Occupational History  . Occupation: Banking    Comment: Engineer, manufacturing  Tobacco Use  . Smoking status: Former Smoker    Years: 5.00    Quit date: 06/17/1997    Years since quitting: 22.9  . Smokeless tobacco: Never Used  . Tobacco comment: started smoking at ahe 16, and quit at age 83  Vaping Use  . Vaping Use: Never used  Substance and Sexual Activity  . Alcohol use: Yes    Alcohol/week: 0.0 standard drinks    Comment: occasional alcohol use  . Drug use: No  . Sexual activity: Not on file  Other Topics Concern  . Not on file  Social History Narrative  . Not on file   Social Determinants of Health   Financial Resource Strain:   . Difficulty of Paying Living Expenses: Not on file  Food Insecurity:   . Worried About Charity fundraiser in the Last Year: Not on file  . Ran Out of Food in the Last Year: Not on file  Transportation Needs:   . Lack of Transportation (Medical): Not on file  . Lack of Transportation (Non-Medical): Not on file  Physical Activity:   . Days of Exercise per Week: Not on file  . Minutes of Exercise per Session: Not on file  Stress:   . Feeling of Stress : Not on file  Social Connections:   . Frequency of Communication with Friends and Family: Not on file  . Frequency of Social Gatherings with Friends and Family: Not on file  . Attends Religious Services: Not on file  . Active Member of Clubs or Organizations: Not on file  .  Attends Archivist Meetings: Not on file  . Marital Status: Not on file    Allergies:  Allergies  Allergen Reactions  . Bupropion     Extreme anxiety  . Morphine     Metabolic Disorder Labs: Lab Results  Component Value Date   HGBA1C 10.1 (H) 01/23/2020   MPG 243.17 01/23/2020   No results found for: PROLACTIN Lab Results  Component Value Date   CHOL 173 05/24/2019   TRIG 718 (HH) 05/24/2019   HDL 30 (L) 05/24/2019   CHOLHDL 5.8 (H) 05/24/2019   LDLCALC 42 05/24/2019   [place] Comment 10/30/2018   Lab Results  Component Value Date   TSH 1.360 05/24/2019   TSH 1.570 01/24/2017    Therapeutic Level Labs: Lab Results  Component Value Date   LITHIUM 0.22 (L) 04/18/2020   LITHIUM 0.15 (L) 03/07/2020   No results found for: VALPROATE No components found for:  CBMZ  Current Medications: Current Outpatient Medications  Medication Sig Dispense Refill  . ALPRAZolam (XANAX) 0.5 MG tablet TAKE 1 TABLET (0.5 MG TOTAL) BY MOUTH EVERY 4 (FOUR) HOURS AS NEEDED. FOR ANXIETY 30 tablet 2  . blood glucose meter kit and supplies KIT Dispense based on patient and insurance preference. Use up to four times daily as directed. (FOR ICD-9 250.00, 250.01). 1 each 0  . Continuous Blood Gluc Sensor (FREESTYLE LIBRE 14 DAY SENSOR) MISC SMARTSIG:1 Each Topical Every 2 Weeks    . hydrOXYzine (VISTARIL) 50 MG capsule Take 1-2 capsules (50-100 mg total) by mouth at bedtime as needed. For sleep 60 capsule 1  . insulin aspart (NOVOLOG FLEXPEN) 100 UNIT/ML FlexPen Inject up to 10 units three times daily as directed by physician 15 mL 5  . Insulin Pen Needle 32G X 4 MM MISC 100 applicators by Does not apply route 4 (four) times daily. 100 each 3  . ketoconazole (NIZORAL) 2 % cream APPLY TO AFFECTED AREA ONCE DAILY FOR 7 DAYS AS NEEDED (Patient taking differently: Apply 1 application topically daily. APPLY TO AFFECTED AREA ONCE DAILY FOR 7 DAYS AS NEEDED) 15 g 3  . lamoTRIgine (LAMICTAL) 150  MG tablet Take 1 tablet (150 mg total) by mouth daily. 90 tablet 0  . LEVEMIR FLEXTOUCH 100 UNIT/ML FlexPen Inject 28 Units into the skin daily. 15 mL 5  . lithium carbonate 300 MG capsule Take 1 capsule (300 mg total) by mouth 2 (two) times daily with a meal. (Patient taking differently: Take 600 mg by mouth 2 (two) times daily with a meal. ) 60 capsule 1  . metFORMIN (GLUCOPHAGE-XR) 500 MG 24 hr tablet TAKE 2 TABLETS BY MOUTH TWICE A DAY 120 tablet 3  . omeprazole (PRILOSEC) 40 MG capsule TAKE 1 CAPSULE BY MOUTH EVERY DAY 30 capsule 0  . ondansetron (ZOFRAN) 4 MG tablet Take 1 tablet (4 mg total) by mouth daily as needed for nausea or vomiting. 30 tablet 1  . oxyCODONE-acetaminophen (PERCOCET) 7.5-325 MG tablet Take 1 tablet by mouth every 4 (four) hours as needed. 60 tablet 0  . predniSONE (DELTASONE) 10 MG tablet PLEASE SEE ATTACHED FOR DETAILED DIRECTIONS    . promethazine (PHENERGAN) 25 MG tablet TAKE 1 TABLET (25 MG TOTAL) BY MOUTH EVERY 6 (SIX) HOURS AS NEEDED FOR NAUSEA OR VOMITING. 20 tablet 1  . rosuvastatin (CRESTOR) 20 MG tablet TAKE 1 TABLET BY MOUTH EVERY DAY (Patient taking differently: Take 20 mg by mouth daily. ) 30 tablet 12  . sildenafil (VIAGRA) 50 MG tablet Take 1-2 tablets (50-100 mg total) by mouth daily as needed. 90 tablet 3  . tamsulosin (FLOMAX) 0.4 MG CAPS capsule Take 0.4 mg by mouth daily.    . TRULICITY 1.5 MG/0.5ML SOPN SMARTSIG:0.5 Milliliter(s) SUB-Q Once a Week    . Vilazodone HCl (VIIBRYD) 40 MG TABS Take 1 tablet (40 mg total) by mouth daily. 90 tablet 0   No current facility-administered medications for this visit.     Musculoskeletal: Strength & Muscle Tone: UTA Gait & Station: UTA Patient leans: N/A  Psychiatric Specialty Exam: Review of Systems  Gastrointestinal: Positive for nausea (chronic) and vomiting (chronic).  Psychiatric/Behavioral: Positive for sleep disturbance.  The patient is nervous/anxious.   All other systems reviewed and are  negative.   There were no vitals taken for this visit.There is no height or weight on file to calculate BMI.  General Appearance: Casual  Eye Contact:  Fair  Speech:  Clear and Coherent  Volume:  Normal  Mood:  Anxious  Affect:  Congruent  Thought Process:  Goal Directed and Descriptions of Associations: Intact  Orientation:  Full (Time, Place, and Person)  Thought Content: Logical   Suicidal Thoughts:  No  Homicidal Thoughts:  No  Memory:  Immediate;   Fair Recent;   Fair Remote;   Fair  Judgement:  Fair  Insight:  Fair  Psychomotor Activity:  Normal  Concentration:  Concentration: Fair and Attention Span: Fair  Recall:  [employer] of Knowledge: Fair  Language: Fair  Akathisia:  No  Handed:  Right  AIMS (if indicated):uta  Assets:  Communication Skills Desire for Improvement Housing Social Support  ADL's:  Intact  Cognition: WNL  Sleep:  Poor,nightmares   Screenings: GAD-7     Counselor from 03/10/2020 in [facility] Visit from 04/16/2019 in [facility]  Total GAD-7 Score 13 20    PHQ2-9     Counselor from 03/10/2020 in [facility] Video Visit from 10/13/2019 in [facility] Visit from 08/23/2019 in [facility] Visit from 04/16/2019 in [facility] Visit from 02/10/2018 in [facility]  PHQ-2 Total Score $RemoveBef'5 3 6 6 1  'VFsTowVoMl$ PHQ-9 Total Score $RemoveBef'15 11 23 25 4       'eFVXOhvQbe$ Assessment and Plan: [place] is a 44 year old Caucasian male, unemployed, lives in [place], has a history of MDD, OSA on CPAP, diabetes melitis, arthritis was evaluated by telemedicine today.  Patient is biologically predisposed given his history of trauma, family history of mental health problems.  Patient is currently struggling with sleep problems, situational stressors.  He will benefit from the following plan.  Plan MDD-some  progress Viibryd 40 mg p.o. daily Lithium 300 mg p.o. twice daily Lamictal 150 mg p.o. daily Add hydroxyzine 50 to 100 mg p.o. nightly as needed for sleep Discussed to keep a sleep diary, discussed to keep dream journal.   GAD-unstable Continue CBT with Ms. Christina Hussami.  His anxiety symptoms are more situational. He does have Xanax as needed available prescribed by primary care provider. Start hydroxyzine 50 to 100 mg p.o. nightly as needed for anxiety and sleep  Social anxiety disorder-improving Continue CBT  Personality disorder unspecified-continue CBT We will coordinate care.  I have reviewed the most recent labs-dated 04/18/2020-lithium level-0.22-subtherapeutic.  Patient reports he is compliant on his lithium. BMP-BUN-elevated at 25. Will repeat creatinine and BUN.  Follow-up in clinic in 3 to 4 weeks or sooner if needed.  I have spent atleast 30 minutes face to face by video with patient today. More than 50 % of the time was spent for preparing to see the patient ( e.g., review of test, records ),ordering medications and test ,psychoeducation and supportive psychotherapy and care coordination,as well as documenting clinical information in electronic health record,interpreting and communication of test results This note was generated in part or whole with voice recognition software. Voice recognition is usually quite accurate but there are transcription errors that can and very often do occur. I apologize for any typographical errors that were not detected and corrected.       Ursula Alert, MD 05/09/2020,  2:10 PM

## 2020-05-17 ENCOUNTER — Other Ambulatory Visit: Payer: Self-pay | Admitting: Family Medicine

## 2020-05-17 MED ORDER — OXYCODONE-ACETAMINOPHEN 7.5-325 MG PO TABS: 1.0000 | ORAL_TABLET | ORAL | 0 refills | Status: DC | PRN

## 2020-05-22 ENCOUNTER — Other Ambulatory Visit: Payer: Self-pay | Admitting: Family Medicine

## 2020-05-24 ENCOUNTER — Other Ambulatory Visit: Payer: Self-pay

## 2020-05-24 ENCOUNTER — Ambulatory Visit (INDEPENDENT_AMBULATORY_CARE_PROVIDER_SITE_OTHER): Payer: 59 | Admitting: Licensed Clinical Social Worker

## 2020-05-24 DIAGNOSIS — F331 Major depressive disorder, recurrent, moderate: Secondary | ICD-10-CM

## 2020-05-24 NOTE — Progress Notes (Signed)
Virtual Visit via Video Note  I connected with [place] on 05/24/20 at  8:00 AM EST by a video enabled telemedicine application and verified that I am speaking with the correct person using two identifiers.  Video connection was lost when less than 50% of the duration of the visit was complete, at which time the remainder of the visit was completed via audio only.  Location: Patient: home Provider: remote office [place], [place])   I discussed the limitations of evaluation and management by telemedicine and the availability of in person appointments. The patient expressed understanding and agreed to proceed.  The patient was advised to call back or seek an in-person evaluation if the symptoms worsen or if the condition fails to improve as anticipated.  I provided 45 minutes of non-face-to-face time during this encounter.   [place] Bee R Rakiya Krawczyk, LCSW    THERAPIST PROGRESS NOTE  Session Time: 8-8:45a  Participation Level: Active  Behavioral Response: Neat and Well GroomedAlertgenerally pleasant and positive  Type of Therapy: Individual Therapy  Treatment Goals addressed: Coping  Interventions: CBT and Supportive  Summary: [place] is a 44 y.o. male who presents with improving symptoms related to depression and anxiety. Pt reports that overall mood has been stable and that he feels he is managing anxiety and stress better. [place] reports that his vomiting behaviors have decreased significantly, so he feels that medication is managing overall symptoms well.   Allowed pt to explore and express thoughts and feelings about current life events--pt is very happy about securing employment. Pt reports that he had several job offers, so he was able to choose one that he felt was the best fit.  Discussed nightmares--pt feels that the frequency is decreasing. Pt also feels that cyclical vomiting is decreasing.  Pt feels more in control of eating patterns/behaviors and  continues with a positive health-related focus.  Reviewed importance of self care, positive social activities, adequate rest, and maintaining healthy eating/physical activity behaviors.   Suicidal/Homicidal: No  SI, HI, or AVH reported at time of session.  Therapist Response: [place] continues to make good progress with self care, life management, and positive self promotion. Pt continues to make physical and emotional health a priority. Pt reports a decrease in somatic-based cyclical behaviors. This is reflective of overall progress. Treatment showing good evolution and development.  Plan: Return again in 6 weeks.  Diagnosis: Axis I: Generalized Anxiety Disorder and Major Depression, Recurrent severe    Axis II: Personality Disorder NOS    [place] R Evangelene Vora, LCSW 05/24/2020

## 2020-05-28 ENCOUNTER — Other Ambulatory Visit: Payer: Self-pay | Admitting: Family Medicine

## 2020-05-29 ENCOUNTER — Other Ambulatory Visit: Payer: Self-pay | Admitting: Family Medicine

## 2020-05-29 DIAGNOSIS — E1110 Type 2 diabetes mellitus with ketoacidosis without coma: Secondary | ICD-10-CM

## 2020-05-29 MED ORDER — OXYCODONE-ACETAMINOPHEN 7.5-325 MG PO TABS: 1.0000 | ORAL_TABLET | ORAL | 0 refills | Status: DC | PRN

## 2020-06-05 ENCOUNTER — Other Ambulatory Visit: Payer: Self-pay

## 2020-06-05 ENCOUNTER — Telehealth (INDEPENDENT_AMBULATORY_CARE_PROVIDER_SITE_OTHER): Payer: 59 | Admitting: Psychiatry

## 2020-06-05 ENCOUNTER — Encounter: Payer: Self-pay | Admitting: Psychiatry

## 2020-06-05 ENCOUNTER — Other Ambulatory Visit
Admission: RE | Admit: 2020-06-05 | Discharge: 2020-06-05 | Disposition: A | Discharge status: Home / Self Care | Payer: 59 | Source: Ambulatory Visit | Attending: Psychiatry | Admitting: Psychiatry

## 2020-06-05 ENCOUNTER — Telehealth: Payer: Self-pay | Admitting: *Deleted

## 2020-06-05 DIAGNOSIS — F4010 Social phobia, unspecified: Secondary | ICD-10-CM | POA: Diagnosis not present

## 2020-06-05 DIAGNOSIS — F3341 Major depressive disorder, recurrent, in partial remission: Secondary | ICD-10-CM

## 2020-06-05 DIAGNOSIS — Z79899 Other long term (current) drug therapy: Secondary | ICD-10-CM

## 2020-06-05 DIAGNOSIS — F609 Personality disorder, unspecified: Secondary | ICD-10-CM

## 2020-06-05 DIAGNOSIS — G4700 Insomnia, unspecified: Secondary | ICD-10-CM

## 2020-06-05 DIAGNOSIS — R141 Gas pain: Secondary | ICD-10-CM | POA: Insufficient documentation

## 2020-06-05 DIAGNOSIS — R142 Eructation: Secondary | ICD-10-CM | POA: Insufficient documentation

## 2020-06-05 DIAGNOSIS — F5105 Insomnia due to other mental disorder: Secondary | ICD-10-CM | POA: Insufficient documentation

## 2020-06-05 DIAGNOSIS — F411 Generalized anxiety disorder: Secondary | ICD-10-CM | POA: Diagnosis not present

## 2020-06-05 LAB — TSH: TSH: 2.411 u[IU]/mL (ref 0.350–4.500)

## 2020-06-05 MED ORDER — LITHIUM CARBONATE 300 MG PO CAPS: 300.0000 mg | ORAL_CAPSULE | Freq: Two times a day (BID) | ORAL | 0 refills | Status: DC

## 2020-06-05 MED ORDER — PRAZOSIN HCL 1 MG PO CAPS: 1.0000 mg | ORAL_CAPSULE | Freq: Every day | ORAL | 1 refills | Status: DC

## 2020-06-05 NOTE — Progress Notes (Signed)
Virtual Visit via Telephone Note  I connected with Raymond Rose on 06/05/20 at  9:00 AM EST by telephone and verified that I am speaking with the correct person using two identifiers.  Location Provider Location : [facility] Patient Location : Home  Participants: Patient , Provider   I discussed the limitations, risks, security and privacy concerns of performing an evaluation and management service by telephone and the availability of in person appointments. I also discussed with the patient that there may be a patient responsible charge related to this service. The patient expressed understanding and agreed to proceed.  I discussed the assessment and treatment plan with the patient. The patient was provided an opportunity to ask questions and all were answered. The patient agreed with the plan and demonstrated an understanding of the instructions.   The patient was advised to call back or seek an in-person evaluation if the symptoms worsen or if the condition fails to improve as anticipated.   Jal MD OP Progress Note  06/05/2020 9:51 AM Delshon Blanchfield Bontrager  MRN:  [id number]  Chief Complaint:  Chief Complaint    Follow-up     HPI: Raymond Rose is a 44 year old Caucasian male, married, lives in [place], has a history of MDD, GAD, social anxiety disorder, sleep apnea, cyclical vomiting, diabetes melitis, erectile dysfunction was evaluated by telemedicine today.  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  He reports he is going to start soon, December 28.  He reports ever since learning about this new job offer he has been feeling better.  He reports he currently does not have any significant anxiety or depressive symptoms.  He denies any self-injurious thoughts.  Patient however continues to struggle with sleep.  He reports he has nightmares and the hydroxyzine does not seem to help the nightmares.  He reports he wakes up in the middle of the  night due to nightmares and is unable to fall back asleep.  He reports his nightmares are usually violent, about his family, about his dad and so on.  Patient denies any suicidality, homicidality or perceptual disturbances.  Patient is compliant on medications.  Denies side effects.  Patient continues to be compliant with psychotherapy sessions.  Patient reports he forgot all about getting his labs repeated, however agrees to get it done as soon as possible.  Patient denies any other concerns today.   Visit Diagnosis:    ICD-10-CM   1. MDD (major depressive disorder), recurrent, in partial remission (HCC)  F33.41 lithium carbonate 300 MG capsule  2. GAD (generalized anxiety disorder)  F41.1   3. Social anxiety disorder  F40.10   4. Insomnia, unspecified type  G47.00 prazosin (MINIPRESS) 1 MG capsule  5. Personality disorder, unspecified ([place])  F60.9   6. High risk medication use  Z79.899 TSH    Past Psychiatric History: I have reviewed past psychiatric history from my progress note on 04/21/2019.  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  Family History:  Family History  Problem Relation Age of Onset   Depression Mother    Heart attack Father 59   Arthritis Other    Alcohol abuse Other     Lung cancer Other    Hyperlipidemia Other    CAD Other    Stroke Other    Hypertension Other    Bipolar disorder Other     Social History: I have reviewed social history from my progress note on 04/21/2019 Social History   Socioeconomic History   Marital status: Married    Spouse name: Not on file   Number of children: Not on file   Years of education: Not on file   Highest education level: Not on file  Occupational History   Occupation: Banking    Comment: [employer]  Tobacco Use   Smoking status: Former Smoker    Years: 5.00    Quit date: 06/17/1997    Years since quitting: 22.9   Smokeless tobacco: Never Used   Tobacco comment: started smoking at [age]he 16, and quit at age 62  Vaping Use   Vaping Use: Never used  Substance and Sexual Activity   Alcohol use: Yes    Alcohol/week: 0.0 standard drinks    Comment: occasional alcohol use   Drug use: No   Sexual activity: Not on file  Other Topics Concern   Not on file  Social History Narrative   Not on file   Social Determinants of Health   Financial Resource Strain: Not on file  Food Insecurity: Not on file  Transportation Needs: Not on file  Physical Activity: Not on file  Stress: Not on file  Social Connections: Not on file    Allergies:  Allergies  Allergen Reactions   Bupropion     Extreme anxiety   Morphine     Metabolic Disorder Labs: Lab Results  Component Value Date   HGBA1C 10.1 (H) 01/23/2020   MPG 243.17 01/23/2020   No results found for: PROLACTIN Lab Results  Component Value Date   CHOL 173 05/24/2019   TRIG 718 (HH) 05/24/2019   HDL 30 (L) 05/24/2019   CHOLHDL 5.8 (H) 05/24/2019   LDLCALC 42 05/24/2019   [place] Comment 10/30/2018   Lab Results  Component Value Date   TSH 1.360 05/24/2019   TSH 1.570 01/24/2017    Therapeutic Level Labs: Lab Results  Component Value Date   LITHIUM 0.22 (L) 04/18/2020   LITHIUM 0.15 (L) 03/07/2020   No results found for:  VALPROATE No components found for:  CBMZ  Current Medications: Current Outpatient Medications  Medication Sig Dispense Refill   Dulaglutide 3 MG/0.5ML SOPN Inject into the skin.     ALPRAZolam (XANAX) 0.5 MG tablet TAKE 1 TABLET (0.5 MG TOTAL) BY MOUTH EVERY 4 (FOUR) HOURS AS NEEDED. FOR ANXIETY 30 tablet 2   BD PEN NEEDLE NANO 2ND GEN 32G X 4 MM MISC USE AS DIRECTED 4 TIMES A DAY 100 each 3   blood glucose meter kit and supplies KIT Dispense based on patient and insurance preference. Use up to four times daily as directed. (FOR ICD-9 250.00, 250.01). 1 each 0   Continuous Blood Gluc Sensor (FREESTYLE  LIBRE 14 DAY SENSOR) MISC SMARTSIG:1 Each Topical Every 2 Weeks     hydrOXYzine (VISTARIL) 50 MG capsule Take 1-2 capsules (50-100 mg total) by mouth at bedtime as needed. For sleep 60 capsule 1   insulin aspart (NOVOLOG FLEXPEN) 100 UNIT/ML FlexPen Inject up to 10 units three times daily as directed by physician 15 mL 5   ketoconazole (NIZORAL) 2 % cream APPLY TO AFFECTED AREA ONCE DAILY FOR 7 DAYS AS NEEDED (Patient taking differently: Apply 1 application topically daily. APPLY TO AFFECTED AREA ONCE DAILY FOR 7 DAYS AS NEEDED) 15 g 3   lamoTRIgine (LAMICTAL) 150 MG tablet Take 1 tablet (150 mg total) by mouth daily. 90 tablet 0   LEVEMIR FLEXTOUCH 100 UNIT/ML FlexPen Inject 28 Units into the skin daily. 15 mL 5   lithium carbonate 300 MG capsule Take 1 capsule (300 mg total) by mouth 2 (two) times daily with a meal. 180 capsule 0   metFORMIN (GLUCOPHAGE-XR) 500 MG 24 hr tablet TAKE 2 TABLETS BY MOUTH TWICE A DAY 120 tablet 3   omeprazole (PRILOSEC) 40 MG capsule TAKE 1 CAPSULE BY MOUTH EVERY DAY 30 capsule 0   ondansetron (ZOFRAN) 4 MG tablet Take 1 tablet (4 mg total) by mouth daily as needed for nausea or vomiting. 30 tablet 1   oxyCODONE-acetaminophen (PERCOCET) 7.5-325 MG tablet Take 1 tablet by mouth every 4 (four) hours as needed. 60 tablet 0   prazosin (MINIPRESS) 1 MG  capsule Take 1 capsule (1 mg total) by mouth at bedtime. 30 capsule 1   predniSONE (DELTASONE) 10 MG tablet PLEASE SEE ATTACHED FOR DETAILED DIRECTIONS     promethazine (PHENERGAN) 25 MG tablet TAKE 1 TABLET (25 MG TOTAL) BY MOUTH EVERY 6 (SIX) HOURS AS NEEDED FOR NAUSEA OR VOMITING. 20 tablet 1   rosuvastatin (CRESTOR) 20 MG tablet TAKE 1 TABLET BY MOUTH EVERY DAY (Patient taking differently: Take 20 mg by mouth daily. ) 30 tablet 12   sildenafil (VIAGRA) 50 MG tablet Take 1-2 tablets (50-100 mg total) by mouth daily as needed. 90 tablet 3   tamsulosin (FLOMAX) 0.4 MG CAPS capsule Take 0.4 mg by mouth daily.     TRULICITY 1.5 RU/2.4WI SOPN SMARTSIG:0.5 Milliliter(s) SUB-Q Once a Week     Vilazodone HCl (VIIBRYD) 40 MG TABS Take 1 tablet (40 mg total) by mouth daily. 90 tablet 0   No current facility-administered medications for this visit.     Musculoskeletal: Strength & Muscle Tone: UTA Gait & Station: UTA Patient leans: N/A  Psychiatric Specialty Exam: Review of Systems  Psychiatric/Behavioral: Positive for sleep disturbance.  All other systems reviewed and are negative.   There were no vitals taken for this visit.There is no height or weight on file to calculate BMI.  General Appearance: UTA  Eye Contact:  UTA  Speech:  Clear and Coherent  Volume:  Normal  Mood:  Euthymic  Affect:  UTA  Thought Process:  Goal Directed and Descriptions of Associations: Intact  Orientation:  Full (Time, Place, and Person)  Thought Content: Logical   Suicidal Thoughts:  No  Homicidal Thoughts:  No  Memory:  Immediate;   Fair Recent;   Fair Remote;   Fair  Judgement:  Fair  Insight:  Fair  Psychomotor Activity:  UTA  Concentration:  Concentration: Fair and Attention Span: Fair  Recall:  [employer] of Knowledge: Fair  Language: Fair  Akathisia:  No  Handed:  Right  AIMS (if indicated): UTA  Assets:  Communication Skills Desire for Improvement Housing Social Support  ADL's:   Intact  Cognition: WNL  Sleep:  Poor nightmares   Screenings: GAD-7   Health and safety inspector from 03/10/2020 in [place] Office Visit from 04/16/2019 in [facility]  Total GAD-7 Score 13 20    PHQ2-9   [place] from 03/10/2020 in [place] Video Visit from 10/13/2019 in [place] Office Visit from 08/23/2019 in [place] Visit from 04/16/2019 in [place] Visit from 02/10/2018 in [place]  PHQ-2 Total Score _0 PHQ-9 Total Score _1 Assessment and Plan: Raymond Rose is a 44 year old Caucasian male, unemployed, lives in [place], has a history of MDD, OSA on CPAP, diabetes melitis, arthritis was evaluated by telemedicine today.  Patient is biologically predisposed given his history of trauma, family history of mental health problems.  Patient is currently struggling with nightmares however otherwise reports mood symptoms are stable.  Plan as noted below.  Plan MDD-improving Viibryd 40 mg p.o. daily Lithium 300 mg p.o. twice daily Lithium level-04/18/2020-0.22-subtherapeutic Lamictal 150 mg p.o. daily Hydroxyzine 50 to 100 mg p.o. nightly as needed for sleep   GAD-improving Continue CBT with Ms. Christina Hussami He does have Xanax as needed available prescribed by primary care provider. Hydroxyzine 50 to 100 mg p.o. nightly as needed for anxiety and sleep  Social anxiety disorder-improving Continue CBT  Insomnia unspecified-unstable Continue hydroxyzine as prescribed as needed Start prazosin 1 mg p.o. nightly for nightmares Continue CBT  Personality disorder unspecified-unstable Continue CBT   Pending labs-patient has been noncompliant with repeat BUN/creatinine. Will order also TSH. Patient will go to [facility] lab.  Follow-up in clinic in 3 to 4 weeks or sooner  if needed.  I have spent atleast 20 minutes non face to face with patient today. More than 50 % of the time was spent for preparing to see the patient ( e.g., review of test, records ), ordering medications and test ,psychoeducation and supportive psychotherapy and care coordination,as well as documenting clinical information in electronic health record. This note was generated in part or whole with voice recognition software. Voice recognition is usually quite accurate but there are transcription errors that can and very often do occur. I apologize for any typographical errors that were not detected and corrected.         Ursula Alert, MD 06/05/2020, 9:51 AM

## 2020-06-05 NOTE — Telephone Encounter (Signed)
Lab orders sent to [facility] lab. Fax received by lab.

## 2020-06-07 ENCOUNTER — Other Ambulatory Visit: Payer: Self-pay | Admitting: Family Medicine

## 2020-06-08 MED ORDER — OXYCODONE-ACETAMINOPHEN 7.5-325 MG PO TABS: 1.0000 | ORAL_TABLET | ORAL | 0 refills | Status: DC | PRN

## 2020-06-18 ENCOUNTER — Other Ambulatory Visit: Payer: Self-pay | Admitting: Family Medicine

## 2020-06-20 ENCOUNTER — Other Ambulatory Visit: Payer: Self-pay | Admitting: Family Medicine

## 2020-06-20 MED ORDER — OXYCODONE-ACETAMINOPHEN 7.5-325 MG PO TABS: 1.0000 | ORAL_TABLET | ORAL | 0 refills | Status: DC | PRN

## 2020-06-22 ENCOUNTER — Other Ambulatory Visit: Payer: Self-pay | Admitting: Family Medicine

## 2020-06-25 ENCOUNTER — Other Ambulatory Visit: Payer: Self-pay | Admitting: Family Medicine

## 2020-06-25 DIAGNOSIS — E119 Type 2 diabetes mellitus without complications: Secondary | ICD-10-CM

## 2020-06-30 ENCOUNTER — Other Ambulatory Visit: Payer: Self-pay | Admitting: Family Medicine

## 2020-07-03 MED ORDER — OXYCODONE-ACETAMINOPHEN 7.5-325 MG PO TABS: 1.0000 | ORAL_TABLET | ORAL | 0 refills | Status: DC | PRN

## 2020-07-08 ENCOUNTER — Other Ambulatory Visit: Payer: Self-pay | Admitting: Psychiatry

## 2020-07-08 DIAGNOSIS — F411 Generalized anxiety disorder: Secondary | ICD-10-CM

## 2020-07-10 ENCOUNTER — Telehealth: Payer: Self-pay

## 2020-07-10 NOTE — Telephone Encounter (Signed)
went online to [email or web address] and submitted the prior authorization -pending 

## 2020-07-10 NOTE — Telephone Encounter (Signed)
received fax request that a prior auth was needed for the viibryd.

## 2020-07-11 ENCOUNTER — Telehealth (INDEPENDENT_AMBULATORY_CARE_PROVIDER_SITE_OTHER): Payer: 59 | Admitting: Physician Assistant

## 2020-07-11 ENCOUNTER — Telehealth: Payer: Self-pay

## 2020-07-11 DIAGNOSIS — R059 Cough, unspecified: Secondary | ICD-10-CM

## 2020-07-11 NOTE — Telephone Encounter (Signed)
Appt scheduled

## 2020-07-11 NOTE — Telephone Encounter (Signed)
received fax that prior auth was approved from 07-10-20 to  07-09-21

## 2020-07-11 NOTE — Telephone Encounter (Signed)
Copied from [place] [id number]. Topic: General - Other >> Jul 11, 2020  8:09 AM Raymond Rose wrote: Reason for CRM: Patient called in to inform Dr Caryn Section that he woke up with Headache, vomiting, cough, runny nose and sinus congestion. Per patient his employer need to know if he should be Covid tested and he also need Rose note to return back to work please call Ph# [phone]

## 2020-07-11 NOTE — Progress Notes (Signed)
MyChart Video Visit    Virtual Visit via Video Note   This visit type was conducted due to national recommendations for restrictions regarding the COVID-19 Pandemic (e.g. social distancing) in an effort to limit this patient's exposure and mitigate transmission in our community. This patient is at least at moderate risk for complications without adequate follow up. This format is felt to be most appropriate for this patient at this time. Physical exam was limited by quality of the video and audio technology used for the visit.   Patient location: Home Provider location: Office   I discussed the limitations of evaluation and management by telemedicine and the availability of in person appointments. The patient expressed understanding and agreed to proceed.  Patient: Raymond Rose   DOB: [birth year]   45 y.o. Male  MRN: [id number] Visit Date: 07/11/2020  Today's healthcare provider: Trinna Post, PA-C   Chief Complaint  Patient presents with  . URI  I,Porsha C McClurkin,acting as a scribe for [employer], PA-C.,have documented all relevant documentation on the behalf of Trinna Post, PA-C,as directed by  Trinna Post, PA-C while in the presence of Trinna Post, PA-C.  Subjective    URI  This is a new problem. The current episode started yesterday. The problem has been gradually worsening. There has been no fever. Associated symptoms include coughing, diarrhea, nausea, rhinorrhea, sneezing, a sore throat and vomiting. Pertinent negatives include no congestion, ear pain, headaches, sinus pain or wheezing. He has tried nothing for the symptoms. The treatment provided no relief.    Patient with history of Type II DM presenting with one day of fatigue, congestion, nausea, vomiting, diarrhea. Has been vaccinated against COVID and flu. Last a1c 8.5% 02/2020. He is followed by endocrinology and reports his sugars have been running in the 200s. He reports he is familiar  with illness dosing of his insulin.    Medications: Outpatient Medications Prior to Visit  Medication Sig  . ALPRAZolam (XANAX) 0.5 MG tablet TAKE 1 TABLET (0.5 MG TOTAL) BY MOUTH EVERY 4 (FOUR) HOURS AS NEEDED. FOR ANXIETY  . BD PEN NEEDLE NANO 2ND GEN 32G X 4 MM MISC USE AS DIRECTED 4 TIMES A DAY  . blood glucose meter kit and supplies KIT Dispense based on patient and insurance preference. Use up to four times daily as directed. (FOR ICD-9 250.00, 250.01).  . Continuous Blood Gluc Sensor (FREESTYLE LIBRE 14 DAY SENSOR) MISC SMARTSIG:1 Each Topical Every 2 Weeks  . Dulaglutide 3 MG/0.5ML SOPN Inject into the skin.  . hydrOXYzine (VISTARIL) 50 MG capsule Take 1-2 capsules (50-100 mg total) by mouth at bedtime as needed. For sleep  . insulin aspart (NOVOLOG FLEXPEN) 100 UNIT/ML FlexPen Inject up to 10 units three times daily as directed by physician  . ketoconazole (NIZORAL) 2 % cream APPLY TO AFFECTED AREA ONCE DAILY FOR 7 DAYS AS NEEDED (Patient taking differently: Apply 1 application topically daily. APPLY TO AFFECTED AREA ONCE DAILY FOR 7 DAYS AS NEEDED)  . lamoTRIgine (LAMICTAL) 150 MG tablet Take 1 tablet (150 mg total) by mouth daily.  Marland Kitchen LEVEMIR FLEXTOUCH 100 UNIT/ML FlexPen Inject 28 Units into the skin daily.  Marland Kitchen lithium carbonate 300 MG capsule Take 1 capsule (300 mg total) by mouth 2 (two) times daily with a meal.  . metFORMIN (GLUCOPHAGE-XR) 500 MG 24 hr tablet TAKE 2 TABLETS BY MOUTH TWICE A DAY  . omeprazole (PRILOSEC) 40 MG capsule TAKE 1 CAPSULE BY MOUTH EVERY  DAY  . oxyCODONE-acetaminophen (PERCOCET) 7.5-325 MG tablet Take 1 tablet by mouth every 4 (four) hours as needed.  . prazosin (MINIPRESS) 1 MG capsule Take 1 capsule (1 mg total) by mouth at bedtime.  . predniSONE (DELTASONE) 10 MG tablet PLEASE SEE ATTACHED FOR DETAILED DIRECTIONS  . promethazine (PHENERGAN) 25 MG tablet TAKE 1 TABLET (25 MG TOTAL) BY MOUTH EVERY 6 (SIX) HOURS AS NEEDED FOR NAUSEA OR VOMITING.  .  rosuvastatin (CRESTOR) 20 MG tablet TAKE 1 TABLET BY MOUTH EVERY DAY (Patient taking differently: Take 20 mg by mouth daily.)  . sildenafil (VIAGRA) 50 MG tablet Take 1-2 tablets (50-100 mg total) by mouth daily as needed.  . tamsulosin (FLOMAX) 0.4 MG CAPS capsule Take 0.4 mg by mouth daily.  . TRULICITY 1.5 UQ/3.3HL SOPN SMARTSIG:0.5 Milliliter(s) SUB-Q Once a Week  . VIIBRYD 40 MG TABS TAKE 1 TABLET BY MOUTH EVERY DAY   No facility-administered medications prior to visit.    Review of Systems  Constitutional: Positive for chills and fatigue. Negative for appetite change and fever.  HENT: Positive for postnasal drip, rhinorrhea, sneezing and sore throat. Negative for congestion, ear pain, sinus pressure and sinus pain.   Respiratory: Positive for cough. Negative for chest tightness, shortness of breath and wheezing.   Gastrointestinal: Positive for diarrhea, nausea and vomiting.  Neurological: Positive for weakness. Negative for headaches.      Objective    There were no vitals taken for this visit.   Physical Exam Constitutional:      Appearance: Normal appearance.  Pulmonary:     Effort: Pulmonary effort is normal. No respiratory distress.  Neurological:     Mental Status: He is alert.  Psychiatric:        Mood and Affect: Mood normal.        Behavior: Behavior normal.        Assessment & Plan    1. Cough  - symptoms c/w viral URI  - no evidence of strep pharyngitis, CAP, AOM, bacterial sinusitis, or other bacterial infection - concern for possible COVID19 infection - will send for outpatient testing - discussed need to quarantine 10 days from start of symptoms (or possibly 5 days with new CDC recommendations) and until fever-free for at least 24 hours - discussed symptomatic management, natural course, and return precautions  -advised to remain in touch with his endocrinologist regarding sick day insulin dosing  - COVID-19, Flu A+B and RSV   No follow-ups on  file.     I discussed the assessment and treatment plan with the patient. The patient was provided an opportunity to ask questions and all were answered. The patient agreed with the plan and demonstrated an understanding of the instructions.   The patient was advised to call back or seek an in-person evaluation if the symptoms worsen or if the condition fails to improve as anticipated.   ITrinna Post, PA-C, have reviewed all documentation for this visit. The documentation on 07/11/20 for the exam, diagnosis, procedures, and orders are all accurate and complete.  The entirety of the information documented in the History of Present Illness, Review of Systems and Physical Exam were personally obtained by me. Portions of this information were initially documented by [facility] and reviewed by me for thoroughness and accuracy.    Paulene Floor [facility] [phone] (phone) [phone] (fax)  [place]

## 2020-07-12 ENCOUNTER — Ambulatory Visit (INDEPENDENT_AMBULATORY_CARE_PROVIDER_SITE_OTHER): Payer: 59 | Admitting: Licensed Clinical Social Worker

## 2020-07-12 ENCOUNTER — Telehealth: Payer: Self-pay | Admitting: Licensed Clinical Social Worker

## 2020-07-12 ENCOUNTER — Other Ambulatory Visit: Payer: Self-pay | Admitting: Family Medicine

## 2020-07-12 ENCOUNTER — Other Ambulatory Visit: Payer: Self-pay

## 2020-07-12 DIAGNOSIS — F4010 Social phobia, unspecified: Secondary | ICD-10-CM | POA: Diagnosis not present

## 2020-07-12 DIAGNOSIS — F411 Generalized anxiety disorder: Secondary | ICD-10-CM

## 2020-07-12 DIAGNOSIS — F609 Personality disorder, unspecified: Secondary | ICD-10-CM | POA: Diagnosis not present

## 2020-07-12 DIAGNOSIS — F3341 Major depressive disorder, recurrent, in partial remission: Secondary | ICD-10-CM

## 2020-07-12 NOTE — Telephone Encounter (Signed)
LCSW counselor reached out to patient to develop safety plan with pt and wife. LMVM for pt to call back ASAP to schedule this today.  Jeanmarie Plant, MSW, LCSW Outpatient Therapist/Triage Specialist

## 2020-07-12 NOTE — Progress Notes (Addendum)
Virtual Visit via Video Note  I connected with Raymond Rose and wife, Raymond Rose on 07/12/20 at  8:00 AM EST and 5:00pm EST by a video enabled telemedicine application and verified that I am speaking with the correct person using two identifiers.  Location: Patient: home Provider: remote office [place], [place])   I discussed the limitations of evaluation and management by telemedicine and the availability of in person appointments. The patient expressed understanding and agreed to proceed.   The patient was advised to call back or seek an in-person evaluation if the symptoms worsen or if the condition fails to improve as anticipated.  I provided 60 minutes of non-face-to-face time during this encounter.   Elvia Aydin R Maridee Slape, LCSW    THERAPIST PROGRESS NOTE  Session Time: 8-8:45a and 5:00-5:15p  Participation Level: Active  Behavioral Response: NeatAlertDepressed  Type of Therapy: Individual Therapy  Treatment Goals addressed: Coping  Interventions: CBT and DBT  Summary: [name] is a 45 y.o. male who presents with continuing symptoms related to depression, anxiety. Pt reports fair quality and quantity of sleep; feels mood is continuing to fluctuate.  Pt reports that he is currently unhappy with his new job "I am happy to be working, but I don't like the culture of this new place".  Explored pts expectations, and any underlying negative thought process that could be contributing factor to pts unhappiness at work. Discussed ANTS and emailed pt worksheet on ANTS (automatic negative thoughts).   Discussed recent suicidal ideation: pt reports that he had ideation with plan and access to multiple means. Pt did not follow through due to finding a job.   Pt presented with flat affect throughout session and with a sense of hopelessness.   Consulted with psychiatrist and decided to complete safety plan with patient and wife.  With pts verbal consent, conference  call was placed with wife and safety plan was completed after pt was finished with work (5pm-5:15pm).  Encouraged pt to continue with self care, focusing on life balance, increase levels of physical activity/cognitive stimulation, and to read psychoeducational materials emailed to him.  Also emailed local crisis resources.   Suicidal/Homicidal: [name] reports that he has sucidal ideation often and even had a specific date in mind that he would follow through with the act if he didn't find a job (Jan 3). Pt reports that he had multiple plans and has access to many methods (firearms, etc.). Consultation with psychiatrist, Dr. Shea Evans. Safety plan completed with patient and wife.  Therapist Response: [name] reporting suicidal ideation and continuation of depression symptoms. Pt has appt with psychiatrist 1/27. Progress to be assessed next session.   Plan: Return again in 5 weeks. Clinician suggested more frequent counseling sessions due to escalation of symptom intensity, but pt deferred.   Diagnosis: Axis I: Generalized Anxiety Disorder, Major Depression, Recurrent severe and Social Anxiety    Axis II: Cluster B Traits    [name], LCSW 07/12/2020

## 2020-07-13 ENCOUNTER — Encounter: Payer: Self-pay | Admitting: Psychiatry

## 2020-07-13 ENCOUNTER — Telehealth (INDEPENDENT_AMBULATORY_CARE_PROVIDER_SITE_OTHER): Payer: 59 | Admitting: Psychiatry

## 2020-07-13 DIAGNOSIS — F4010 Social phobia, unspecified: Secondary | ICD-10-CM | POA: Diagnosis not present

## 2020-07-13 DIAGNOSIS — Z79899 Other long term (current) drug therapy: Secondary | ICD-10-CM

## 2020-07-13 DIAGNOSIS — F411 Generalized anxiety disorder: Secondary | ICD-10-CM

## 2020-07-13 DIAGNOSIS — F5105 Insomnia due to other mental disorder: Secondary | ICD-10-CM

## 2020-07-13 DIAGNOSIS — F3341 Major depressive disorder, recurrent, in partial remission: Secondary | ICD-10-CM | POA: Diagnosis not present

## 2020-07-13 LAB — COVID-19, FLU A+B AND RSV
Influenza A, NAA: NOT DETECTED
Influenza B, NAA: NOT DETECTED
RSV, NAA: NOT DETECTED
SARS-CoV-2, NAA: NOT DETECTED

## 2020-07-13 MED ORDER — OXYCODONE-ACETAMINOPHEN 7.5-325 MG PO TABS
1.0000 | ORAL_TABLET | ORAL | 0 refills | Status: DC | PRN
Start: 2020-07-13 — End: 2020-07-24

## 2020-07-13 MED ORDER — ESZOPICLONE 1 MG PO TABS: 1.0000 mg | ORAL_TABLET | Freq: Every evening | ORAL | 1 refills | Status: DC | PRN

## 2020-07-13 NOTE — Patient Instructions (Signed)
Eszopiclone tablets What is this medicine? ESZOPICLONE (es ZOE pi clone) is used to treat insomnia. This medicine helps you to fall asleep and sleep through the night. This medicine may be used for other purposes; ask your health care provider or pharmacist if you have questions. COMMON BRAND NAME(S): Lunesta What should I tell my health care provider before I take this medicine? They need to know if you have any of these conditions:  depression  history of a drug or alcohol abuse problem  liver disease  lung or breathing disease  sleep-walking, driving, eating or other activity while not fully awake after taking a sleep medicine  suicidal thoughts  an unusual or allergic reaction to eszopiclone, other medicines, foods, dyes, or preservatives  pregnant or trying to get pregnant  breast-feeding How should I use this medicine? Take this medicine by mouth with a glass of water. Follow the directions on the prescription label. It is better to take this medicine on an empty stomach and only when you are ready for bed. Do not take your medicine more often than directed. If you have been taking this medicine for several weeks and suddenly stop taking it, you may get unpleasant withdrawal symptoms. Your doctor or health care professional may want to gradually reduce the dose. Do not stop taking this medicine on your own. Always follow your doctor or health care professional's advice. Talk to your pediatrician regarding the use of this medicine in children. Special care may be needed. Overdosage: If you think you have taken too much of this medicine contact a poison control center or emergency room at once. NOTE: This medicine is only for you. Do not share this medicine with others. What if I miss a dose? This does not apply. This medicine should only be taken immediately before going to sleep. Do not take double or extra doses. What may interact with this medicine?  herbal medicines like  kava kava, melatonin, St. John's wort and valerian  lorazepam  medicines for fungal infections like ketoconazole, fluconazole, or itraconazole  olanzapine This list may not describe all possible interactions. Give your health care provider a list of all the medicines, herbs, non-prescription drugs, or dietary supplements you use. Also tell them if you smoke, drink alcohol, or use illegal drugs. Some items may interact with your medicine. What should I watch for while using this medicine? Visit your doctor or health care professional for regular checks on your progress. Keep a regular sleep schedule by going to bed at about the same time nightly. Avoid caffeine-containing drinks in the evening hours, as caffeine can cause trouble with falling asleep. Talk to your doctor if you still have trouble sleeping. After taking this medicine, you may get up out of bed and do an activity that you do not know you are doing. The next morning, you may have no memory of this. Activities include driving a car ("sleep-driving"), making and eating food, talking on the phone, sexual activity, and sleep-walking. Serious injuries have occurred. Stop the medicine and call your doctor right away if you find out you have done any of these activities. Do not take this medicine if you have used alcohol that evening. Do not take it if you have taken another medicine for sleep. The risk of doing these sleep-related activities is higher. Do not take this medicine unless you are able to stay in bed for a full night (7 to 8 hours) before you must be active again. You may have a   decrease in mental alertness the day after use, even if you feel that you are fully awake. Tell your doctor if you will need to perform activities requiring full alertness, such as driving, the next day. Do not stand or sit up quickly after taking this medicine, especially if you are an older patient. This reduces the risk of dizzy or fainting spells. If you or  your family notice any changes in your behavior, such as new or worsening depression, thoughts of harming yourself, anxiety, other unusual or disturbing thoughts, or memory loss, call your doctor right away. After you stop taking this medicine, you may have trouble falling asleep. This is called rebound insomnia. This problem usually goes away on its own after 1 or 2 nights. What side effects may I notice from receiving this medicine? Side effects that you should report to your doctor or health care professional as soon as possible:  allergic reactions like skin rash, itching or hives, swelling of the face, lips, or tongue  changes in vision  confusion  depressed mood  feeling faint or lightheaded, falls  hallucinations  problems with balance, speaking, walking  restlessness, excitability, or feelings of agitation  unusual activities while not fully awake like driving, eating, making phone calls Side effects that usually do not require medical attention (report to your doctor or health care professional if they continue or are bothersome):  dizziness, or daytime drowsiness, sometimes called a hangover effect  headache This list may not describe all possible side effects. Call your doctor for medical advice about side effects. You may report side effects to FDA at 1-800-FDA-1088. Where should I keep my medicine? Keep out of the reach of children. This medicine can be abused. Keep your medicine in a safe place to protect it from theft. Do not share this medicine with anyone. Selling or giving away this medicine is dangerous and against the law. This medicine may cause accidental overdose and death if taken by other adults, children, or pets. Mix any unused medicine with a substance like cat litter or coffee grounds. Then throw the medicine away in a sealed container like a sealed bag or a coffee can with a lid. Do not use the medicine after the expiration date. Store at room temperature  between 15 and 30 degrees C (59 and 86 degrees F). NOTE: This sheet is a summary. It may not cover all possible information. If you have questions about this medicine, talk to your doctor, pharmacist, or health care provider.  2021 Elsevier/Gold Standard (2017-11-28 11:57:05)

## 2020-07-13 NOTE — Progress Notes (Signed)
Virtual Visit via Video Note  I connected with Raymond Rose on 07/13/20 at  4:00 PM EST by a video enabled telemedicine application and verified that I am speaking with the correct person using two identifiers. Location Provider Location : [facility] Patient Location : Home  Participants: Patient , Spouse,Provider    I discussed the limitations of evaluation and management by telemedicine and the availability of in person appointments. The patient expressed understanding and agreed to proceed.   I discussed the assessment and treatment plan with the patient. The patient was provided an opportunity to ask questions and all were answered. The patient agreed with the plan and demonstrated an understanding of the instructions.   The patient was advised to call back or seek an in-person evaluation if the symptoms worsen or if the condition fails to improve as anticipated.  [place] MD OP Progress Note  07/13/2020 5:44 PM Raymond Rose  MRN:  [id number]  Chief Complaint:  Chief Complaint    Follow-up     HPI: Raymond Rose is a 45 year old Caucasian male, married, lives in [place], has a history of MDD, GAD, social anxiety disorder, sleep apnea, cyclical vomiting, diabetes melitis, erectile dysfunction was evaluated by telemedicine today.  Patient as well as his spouse participated in the evaluation today.  Patient today reports his job with the [employer] in [place] as going well. Patient reports since he has been working there only since the past 1 month it is a lot of adjustment for him to get used to and that does make him nervous often. He however has been coping okay.  Patient reports his depressive symptoms as improved. He reports the current combination of medication is beneficial and that does help him with his low mood, sadness, lack of motivation and so on. He reports since being on this current medication combination his self-injurious thoughts have improved. He does not  dwell on these thoughts like he used to before. Patient reports the last time he had suicidal thoughts was in November. He reports at that time he had thought about possibly hurting himself if he is unable to find a job by so-and-so date. He reports he thought about possibly using his firearm on himself however he reports he was able to think through it at that time and was able to come up with the conclusion that he wouldn't be able to hurt himself since he didn't want to hurt his wife. Patient reports his wife as a positive factor in his life and reports that even though he has self-injurious thoughts which are chronic for him he does think about her and wouldn't act on these thoughts because of her. Patient reports he did not talk about the self-injurious thoughts to anyone at that time however did talk to his therapist about it yesterday during his therapy appointment. Patient also disclosed to therapist about an an incident that happened almost 2 years ago when he put the firearm inside his mouth however did not act on these thoughts to hurt himself at that time. Patient reports at that time his depression was unstable and he was not on the right combination of medications.  Patient currently denies any suicidality, homicidality or perceptual disturbances.  Patient reports he continues to struggle with sleep. He reports he does have vivid nightmares often and the current medication, the prazosin as not very beneficial at this dosage.  Collateral information was obtained from spouse who reports that patient is currently doing much better than  how he was doing 2 years ago when he had the suicidal gesture as noted above. She also reports that she has been living with him since the past 20 years and has learned how to monitor him closely to make sure he gets help. Patient usually confides in her about how he is doing however she reports she also has other ways of knowing if he is not doing well .  Patient  as well as wife does not believe taking away firearm is the right option at this point because it is for home protection and agrees that firearm is not really the problem here.   Patient reports therapy sessions is beneficial.  He agrees to make more frequent therapy sessions with his therapist once he is able to find out his schedule at work.  Visit Diagnosis:    ICD-10-CM   1. MDD (major depressive disorder), recurrent, in partial remission ([place])  F33.41   2. GAD (generalized anxiety disorder)  F41.1   3. Social anxiety disorder  F40.10   4. Insomnia due to mental disorder  F51.05 eszopiclone (LUNESTA) 1 MG TABS tablet  5. High risk medication use  Z79.899 Lithium level    BUN+Creat    Past Psychiatric History: I have reviewed past psychiatric history from my progress note on 04/21/2019. Past trials of Wellbutrin, Lexapro, Elavil, Effexor, Zyprexa, Seroquel  Past Medical History:  Past Medical History:  Diagnosis Date  . Anxiety   . Arthritis   . COVID-19 06/2019  . Cyclical vomiting   . Depression   . OSA on CPAP   . Vertigo 01/03/2015    Past Surgical History:  Procedure Laterality Date  . 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 CHOLECYSTECTOMY  2010   lap Cholecystectomy  . 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 CT scan of Brain  12/23/2011   without contrast, [facility], Normal  . TONSILLECTOMY  1985    Family Psychiatric History: I have reviewed family psychiatric history from my progress note on 04/21/2019  Family History:  Family History  Problem Relation Age of Onset  . Depression Mother   . Heart attack Father 103  . Arthritis Other   . Alcohol abuse Other   . Lung cancer Other   . Hyperlipidemia Other   . CAD Other   . Stroke Other   . Hypertension Other   . Bipolar disorder Other     Social History: I have reviewed social history from my progress note on 04/21/2019 Social  History   Socioeconomic History  . Marital status: Married    Spouse name: Not on file  . Number of children: Not on file  . Years of education: Not on file  . Highest education level: Not on file  Occupational History  . Occupation: Banking    Comment: Engineer, manufacturing  Tobacco Use  . Smoking status: Former Smoker    Years: 5.00    Quit date: 06/17/1997    Years since quitting: 23.0  . Smokeless tobacco: Never Used  . Tobacco comment: started smoking at [age]he 16, and quit at age 8  Vaping Use  . Vaping Use: Never used  Substance and Sexual Activity  . Alcohol use: Yes    Alcohol/week: 0.0 standard drinks    Comment: occasional alcohol use  . Drug use: No  . Sexual activity: Not on file  Other  Topics Concern  . Not on file  Social History Narrative  . Not on file   Social Determinants of Health   Financial Resource Strain: Not on file  Food Insecurity: Not on file  Transportation Needs: Not on file  Physical Activity: Not on file  Stress: Not on file  Social Connections: Not on file    Allergies:  Allergies  Allergen Reactions  . Bupropion     Extreme anxiety  . Morphine     Metabolic Disorder Labs: Lab Results  Component Value Date   HGBA1C 10.1 (H) 01/23/2020   MPG 243.17 01/23/2020   No results found for: PROLACTIN Lab Results  Component Value Date   CHOL 173 05/24/2019   TRIG 718 (HH) 05/24/2019   HDL 30 (L) 05/24/2019   CHOLHDL 5.8 (H) 05/24/2019   LDLCALC 42 05/24/2019   [place] Comment 10/30/2018   Lab Results  Component Value Date   TSH 2.411 06/05/2020   TSH 1.360 05/24/2019    Therapeutic Level Labs: Lab Results  Component Value Date   LITHIUM 0.22 (L) 04/18/2020   LITHIUM 0.15 (L) 03/07/2020   No results found for: VALPROATE No components found for:  CBMZ  Current Medications: Current Outpatient Medications  Medication Sig Dispense Refill  . eszopiclone (LUNESTA) 1 MG TABS tablet Take 1 tablet (1 mg total) by mouth at bedtime as  needed for sleep. Take immediately before bedtime 15 tablet 1  . ALPRAZolam (XANAX) 0.5 MG tablet TAKE 1 TABLET (0.5 MG TOTAL) BY MOUTH EVERY 4 (FOUR) HOURS AS NEEDED. FOR ANXIETY 30 tablet 2  . BD PEN NEEDLE NANO 2ND GEN 32G X 4 MM MISC USE AS DIRECTED 4 TIMES A DAY 100 each 3  . blood glucose meter kit and supplies KIT Dispense based on patient and insurance preference. Use up to four times daily as directed. (FOR ICD-9 250.00, 250.01). 1 each 0  . Continuous Blood Gluc Sensor (FREESTYLE LIBRE 14 DAY SENSOR) MISC SMARTSIG:1 Each Topical Every 2 Weeks    . Dulaglutide 3 MG/0.5ML SOPN Inject into the skin.    . hydrOXYzine (VISTARIL) 50 MG capsule Take 1-2 capsules (50-100 mg total) by mouth at bedtime as needed. For sleep 60 capsule 1  . insulin aspart (NOVOLOG FLEXPEN) 100 UNIT/ML FlexPen Inject up to 10 units three times daily as directed by physician 15 mL 5  . ketoconazole (NIZORAL) 2 % cream APPLY TO AFFECTED AREA ONCE DAILY FOR 7 DAYS AS NEEDED (Patient taking differently: Apply 1 application topically daily. APPLY TO AFFECTED AREA ONCE DAILY FOR 7 DAYS AS NEEDED) 15 g 3  . lamoTRIgine (LAMICTAL) 150 MG tablet Take 1 tablet (150 mg total) by mouth daily. 90 tablet 0  . LEVEMIR FLEXTOUCH 100 UNIT/ML FlexPen Inject 28 Units into the skin daily. 15 mL 5  . lithium carbonate 300 MG capsule Take 1 capsule (300 mg total) by mouth 2 (two) times daily with a meal. 180 capsule 0  . metFORMIN (GLUCOPHAGE-XR) 500 MG 24 hr tablet TAKE 2 TABLETS BY MOUTH TWICE A DAY 120 tablet 3  . omeprazole (PRILOSEC) 40 MG capsule TAKE 1 CAPSULE BY MOUTH EVERY DAY 90 capsule 0  . oxyCODONE-acetaminophen (PERCOCET) 7.5-325 MG tablet Take 1 tablet by mouth every 4 (four) hours as needed. 60 tablet 0  . prazosin (MINIPRESS) 1 MG capsule Take 1 capsule (1 mg total) by mouth at bedtime. 30 capsule 1  . predniSONE (DELTASONE) 10 MG tablet PLEASE SEE ATTACHED FOR DETAILED DIRECTIONS    .  promethazine (PHENERGAN) 25 MG tablet  TAKE 1 TABLET (25 MG TOTAL) BY MOUTH EVERY 6 (SIX) HOURS AS NEEDED FOR NAUSEA OR VOMITING. 20 tablet 1  . rosuvastatin (CRESTOR) 20 MG tablet TAKE 1 TABLET BY MOUTH EVERY DAY (Patient taking differently: Take 20 mg by mouth daily.) 30 tablet 12  . sildenafil (VIAGRA) 50 MG tablet Take 1-2 tablets (50-100 mg total) by mouth daily as needed. 90 tablet 3  . tamsulosin (FLOMAX) 0.4 MG CAPS capsule Take 0.4 mg by mouth daily.    . TRULICITY 1.5 OX/7.3ZH SOPN SMARTSIG:0.5 Milliliter(s) SUB-Q Once a Week    . VIIBRYD 40 MG TABS TAKE 1 TABLET BY MOUTH EVERY DAY 90 tablet 0   No current facility-administered medications for this visit.     Musculoskeletal: Strength & Muscle Tone: UTA Gait & Station: UTA Patient leans: N/A  Psychiatric Specialty Exam: Review of Systems  Psychiatric/Behavioral: Positive for dysphoric mood and sleep disturbance. The patient is nervous/anxious.   All other systems reviewed and are negative.   There were no vitals taken for this visit.There is no height or weight on file to calculate BMI.  General Appearance: Casual  Eye Contact:  Fair  Speech:  Clear and Coherent  Volume:  Normal  Mood:  Anxious and Dysphoric Improving  Affect:  Congruent  Thought Process:  Goal Directed and Descriptions of Associations: Intact  Orientation:  Full (Time, Place, and Person)  Thought Content: Logical   Suicidal Thoughts:  No  Homicidal Thoughts:  No  Memory:  Immediate;   Fair Recent;   Fair Remote;   Fair  Judgement:  Fair  Insight:  Fair  Psychomotor Activity:  Normal  Concentration:  Concentration: Fair and Attention Span: Fair  Recall:  [employer] of Knowledge: Fair  Language: Fair  Akathisia:  No  Handed:  Right  AIMS (if indicated): UTA  Assets:  Communication Skills Desire for Improvement Housing Intimacy Resilience Social Support Talents/Skills Transportation Vocational/Educational  ADL's:  Intact  Cognition: WNL  Sleep:  Poor   Screenings: GAD-7    Health and safety inspector from 03/10/2020 in [facility] Visit from 04/16/2019 in [facility]  Total GAD-7 Score 13 20    PHQ2-9   [place] from 03/10/2020 in [facility] Video Visit from 10/13/2019 in [facility] Office Visit from 08/23/2019 in [place] Visit from 04/16/2019 in [facility] Visit from 02/10/2018 in [facility]  PHQ-2 Total Score _0 PHQ-9 Total Score _1 Assessment and Plan: Kadeem Hyle Vinzant is a 45 year old Caucasian male, employed, lives in [place], has a history of MDD, OSA on CPAP, diabetes melitis, arthritis was evaluated by telemedicine today. Patient is biologically predisposed given his history of trauma, family history of mental health problems. Patient is currently making progress with regards to his depression however continues to struggle with sleep problems. Patient currently denies any self-injurious thoughts. Plan as noted below.  Plan MDD-in partial remission Viibryd 40 mg p.o. daily Lithium 300 mg p.o. twice daily Lithium level-04/18/2020-0.22-subtherapeutic Lamictal 150 mg p.o. daily Hydroxyzine 50 to 100 mg p.o. nightly as needed  GAD-improving Continue CBT with Ms. Christina Hussami Patient does have Xanax as needed available prescribed by her primary care provider. Patient however reports he has not been using it much. Continue hydroxyzine 50 to 100 mg p.o. nightly as needed.  Social  anxiety disorder-improving Continue CBT  Insomnia-unstable Start Lunesta 1 mg p.o. nightly Continue prazosin 1 mg p.o. nightly for nightmares Continue CBT Provided medication education and discussed drug to drug interaction.  High risk medication use-will order lithium level, BUN/creatinine. Will fax [facility] to [facility]. I have reviewed the following  labs-TSH-dated 12/20 2021-2.411-within normal limits.  I have obtained collateral information from wife who was present in session today. Crisis plan discussed with patient as well as wife. I have coordinated care with Ms. Christina Hussami-therapist. A safety plan is currently in place for this patient due to his recent self-injurious gestures.  Follow-up in clinic in 3 weeks or sooner if needed. Also encouraged patient to schedule a sooner appointment with his therapist to have more frequent psychotherapy sessions. Patient agrees to check with his current job and once he knows his schedule he agrees to contact the therapist for a sooner follow-up appointment.  I have spent atleast 30 minutes face to face by video with patient today. More than 50 % of the time was spent for preparing to see the patient ( e.g., review of test, records ), obtaining and to review and separately obtained history , ordering medications and test ,psychoeducation and supportive psychotherapy and care coordination,as well as documenting clinical information in electronic health record,interpreting and communication of test results This note was generated in part or whole with voice recognition software. Voice recognition is usually quite accurate but there are transcription errors that can and very often do occur. I apologize for any typographical errors that were not detected and corrected.       Ursula Alert, MD 07/13/2020, 5:44 PM

## 2020-07-24 ENCOUNTER — Telehealth: Payer: Self-pay

## 2020-07-24 ENCOUNTER — Other Ambulatory Visit: Payer: Self-pay | Admitting: Family Medicine

## 2020-07-24 NOTE — Telephone Encounter (Signed)
order faxed and confirmed on 07-20-20

## 2020-07-26 MED ORDER — OXYCODONE-ACETAMINOPHEN 7.5-325 MG PO TABS: 1.0000 | ORAL_TABLET | ORAL | 0 refills | Status: DC | PRN

## 2020-08-01 LAB — TSH: TSH: 2 u[IU]/mL (ref 0.450–4.500)

## 2020-08-02 ENCOUNTER — Other Ambulatory Visit: Payer: Self-pay

## 2020-08-02 ENCOUNTER — Telehealth: Payer: Self-pay

## 2020-08-02 ENCOUNTER — Telehealth (INDEPENDENT_AMBULATORY_CARE_PROVIDER_SITE_OTHER): Payer: 59 | Admitting: Psychiatry

## 2020-08-02 ENCOUNTER — Encounter: Payer: Self-pay | Admitting: Psychiatry

## 2020-08-02 DIAGNOSIS — F3341 Major depressive disorder, recurrent, in partial remission: Secondary | ICD-10-CM

## 2020-08-02 DIAGNOSIS — Z79899 Other long term (current) drug therapy: Secondary | ICD-10-CM

## 2020-08-02 DIAGNOSIS — F411 Generalized anxiety disorder: Secondary | ICD-10-CM | POA: Diagnosis not present

## 2020-08-02 DIAGNOSIS — F5105 Insomnia due to other mental disorder: Secondary | ICD-10-CM

## 2020-08-02 DIAGNOSIS — F4010 Social phobia, unspecified: Secondary | ICD-10-CM

## 2020-08-02 MED ORDER — LAMOTRIGINE 150 MG PO TABS: 150.0000 mg | ORAL_TABLET | Freq: Every day | ORAL | 0 refills | Status: DC

## 2020-08-02 MED ORDER — PRAZOSIN HCL 2 MG PO CAPS: 2.0000 mg | ORAL_CAPSULE | Freq: Every day | ORAL | 1 refills | Status: DC

## 2020-08-02 MED ORDER — ESZOPICLONE 2 MG PO TABS: 2.0000 mg | ORAL_TABLET | Freq: Every evening | ORAL | 0 refills | Status: DC | PRN

## 2020-08-02 NOTE — Telephone Encounter (Signed)
faxed and confirmed labwork orders sent.  ** labwork Bun+creat, [id number] Lithium Level dx: z79.899 only to labs order.

## 2020-08-02 NOTE — Telephone Encounter (Signed)
Thank you :)

## 2020-08-02 NOTE — Progress Notes (Signed)
Virtual Visit via Video Note  I connected with Raymond Rose on 08/02/20 at  8:30 AM EST by a video enabled telemedicine application and verified that I am speaking with the correct person using two identifiers.  Location Provider Location : [facility] Patient Location : Home  Participants: Patient , Provider    I discussed the limitations of evaluation and management by telemedicine and the availability of in person appointments. The patient expressed understanding and agreed to proceed.  I discussed the assessment and treatment plan with the patient. The patient was provided an opportunity to ask questions and all were answered. The patient agreed with the plan and demonstrated an understanding of the instructions.   The patient was advised to call back or seek an in-person evaluation if the symptoms worsen or if the condition fails to improve as anticipated.   [place] MD OP Progress Note  08/02/2020 10:08 AM [name]  MRN:  [id number]  Chief Complaint:  Chief Complaint    Follow-up     HPI: Raymond Rose is a 45 year old Caucasian male, married, lives in [place], has a history of MDD, GAD, social anxiety disorder, sleep apnea cyclical vomiting, diabetes melitis, erectile dysfunction, was evaluated by telemedicine today.  Patient today reports he quit his job last Friday.  Patient reports this job was not a good fit for him.  Patient reports there was a lot of micromanaging at his job and that affected his mental health.  He reports he did try to reach out to someone in HR to raise a concern however no one contacted him back.  Patient hence decided to leave his job and is currently interviewing at other places.  He reports he is not really worried about not staying at that job.  He reports he is coping okay.  Denies any significant depression at this time.  Denies any significant anxiety symptoms.  Patient reports he however has been struggling with sleep.  He reports  the last couple of nights he has not had a good night sleep and has been having nightmares.  He takes the [place] and the prazosin and the current dosage is not helping anymore.  It did help initially.  He does report he has passive thoughts about not wanting to be here and questions himself about why he was born and so on.  He however denies any active suicidal thoughts when he had suicidal intent or plan.  Patient reports he has been talking openly with his wife and that helps.  These thoughts are chronic for him.  Patient reports he is compliant on his medications and denies side effects.  Patient does report he continues to be in psychotherapy sessions however his next appointment is in mid March.  Patient agrees to touch base with his therapist sooner given his recent situational stressors.  Patient was able to get labs done however only TSH was completed which was discussed and reviewed with patient.  Pending labs lithium level, BUN/creatinine.  He agrees to get it done.  Visit Diagnosis:    ICD-10-CM   1. MDD (major depressive disorder), recurrent, in partial remission ([place])  F33.41   2. GAD (generalized anxiety disorder)  F41.1 lamoTRIgine (LAMICTAL) 150 MG tablet  3. Social anxiety disorder  F40.10   4. Insomnia due to mental disorder  F51.05 prazosin (MINIPRESS) 2 MG capsule    eszopiclone (LUNESTA) 2 MG TABS tablet  5. High risk medication use  Z79.899     Past Psychiatric History:  I have reviewed past psychiatric history from my progress note on 04/21/2019.  Past trials of Wellbutrin, Lexapro, Elavil, Effexor, Zyprexa, Seroquel  Past Medical History:  Past Medical History:  Diagnosis Date  . Anxiety   . Arthritis   . COVID-19 06/2019  . Cyclical vomiting   . Depression   . OSA on CPAP   . Vertigo 01/03/2015    Past Surgical History:  Procedure Laterality Date  . 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 CHOLECYSTECTOMY  2010   lap Cholecystectomy  . 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 CT scan of Brain  12/23/2011   without contrast, [facility], Normal  . TONSILLECTOMY  1985    Family Psychiatric History: I have reviewed family psychiatric history from my progress note on 04/21/2019.  Family History:  Family History  Problem Relation Age of Onset  . Depression Mother   . Heart attack Father 52  . Arthritis Other   . Alcohol abuse Other   . Lung cancer Other   . Hyperlipidemia Other   . CAD Other   . Stroke Other   . Hypertension Other   . Bipolar disorder Other     Social History: I have reviewed social history from my progress note on 04/21/2019. Social History   Socioeconomic History  . Marital status: Married    Spouse name: Not on file  . Number of children: Not on file  . Years of education: Not on file  . Highest education level: Not on file  Occupational History  . Occupation: Banking    Comment: Engineer, manufacturing  Tobacco Use  . Smoking status: Former Smoker    Years: 5.00    Quit date: 06/17/1997    Years since quitting: 23.1  . Smokeless tobacco: Never Used  . Tobacco comment: started smoking at [age]he 16, and quit at age 18  Vaping Use  . Vaping Use: Never used  Substance and Sexual Activity  . Alcohol use: Yes    Alcohol/week: 0.0 standard drinks    Comment: occasional alcohol use  . Drug use: No  . Sexual activity: Not on file  Other Topics Concern  . Not on file  Social History Narrative  . Not on file   Social Determinants of Health   Financial Resource Strain: Not on file  Food Insecurity: Not on file  Transportation Needs: Not on file  Physical Activity: Not on file  Stress: Not on file  Social Connections: Not on file    Allergies:  Allergies  Allergen Reactions  . Bupropion     Extreme anxiety  . Morphine     Metabolic Disorder Labs: Lab Results  Component Value Date   HGBA1C 10.1 (H) 01/23/2020   MPG 243.17  01/23/2020   No results found for: PROLACTIN Lab Results  Component Value Date   CHOL 173 05/24/2019   TRIG 718 (HH) 05/24/2019   HDL 30 (L) 05/24/2019   CHOLHDL 5.8 (H) 05/24/2019   LDLCALC 42 05/24/2019   LDLCALC Comment 10/30/2018   Lab Results  Component Value Date   TSH 2.000 07/31/2020   TSH 2.411 06/05/2020    Therapeutic Level Labs: Lab Results  Component Value Date   LITHIUM 0.22 (L) 04/18/2020   LITHIUM 0.15 (L) 03/07/2020   No results found for: VALPROATE No components found for:  CBMZ  Current Medications: Current Outpatient Medications  Medication Sig Dispense Refill  . eszopiclone (LUNESTA) 2 MG TABS tablet Take 1 tablet (2 mg total) by mouth at bedtime as needed for sleep. Take immediately before bedtime 30 tablet 0  . prazosin (MINIPRESS) 2 MG capsule Take 1 capsule (2 mg total) by mouth at bedtime. 30 capsule 1  . ALPRAZolam (XANAX) 0.5 MG tablet TAKE 1 TABLET (0.5 MG TOTAL) BY MOUTH EVERY 4 (FOUR) HOURS AS NEEDED. FOR ANXIETY 30 tablet 2  . BD PEN NEEDLE NANO 2ND GEN 32G X 4 MM MISC USE AS DIRECTED 4 TIMES A DAY 100 each 3  . blood glucose meter kit and supplies KIT Dispense based on patient and insurance preference. Use up to four times daily as directed. (FOR ICD-9 250.00, 250.01). 1 each 0  . Continuous Blood Gluc Sensor (FREESTYLE LIBRE 14 DAY SENSOR) MISC SMARTSIG:1 Each Topical Every 2 Weeks    . Dulaglutide 3 MG/0.5ML SOPN Inject into the skin.    . hydrOXYzine (VISTARIL) 50 MG capsule Take 1-2 capsules (50-100 mg total) by mouth at bedtime as needed. For sleep 60 capsule 1  . insulin aspart (NOVOLOG FLEXPEN) 100 UNIT/ML FlexPen Inject up to 10 units three times daily as directed by physician 15 mL 5  . ketoconazole (NIZORAL) 2 % cream APPLY TO AFFECTED AREA ONCE DAILY FOR 7 DAYS AS NEEDED (Patient taking differently: Apply 1 application topically daily. APPLY TO AFFECTED AREA ONCE DAILY FOR 7 DAYS AS NEEDED) 15 g 3  . lamoTRIgine (LAMICTAL) 150 MG  tablet Take 1 tablet (150 mg total) by mouth daily. 90 tablet 0  . LEVEMIR FLEXTOUCH 100 UNIT/ML FlexPen Inject 28 Units into the skin daily. 15 mL 5  . lithium carbonate 300 MG capsule Take 1 capsule (300 mg total) by mouth 2 (two) times daily with a meal. 180 capsule 0  . metFORMIN (GLUCOPHAGE-XR) 500 MG 24 hr tablet TAKE 2 TABLETS BY MOUTH TWICE A DAY 120 tablet 3  . omeprazole (PRILOSEC) 40 MG capsule TAKE 1 CAPSULE BY MOUTH EVERY DAY 90 capsule 0  . oxyCODONE-acetaminophen (PERCOCET) 7.5-325 MG tablet Take 1 tablet by mouth every 4 (four) hours as needed. 60 tablet 0  . predniSONE (DELTASONE) 10 MG tablet PLEASE SEE ATTACHED FOR DETAILED DIRECTIONS    . promethazine (PHENERGAN) 25 MG tablet TAKE 1 TABLET (25 MG TOTAL) BY MOUTH EVERY 6 (SIX) HOURS AS NEEDED FOR NAUSEA OR VOMITING. 20 tablet 1  . rosuvastatin (CRESTOR) 20 MG tablet TAKE 1 TABLET BY MOUTH EVERY DAY (Patient taking differently: Take 20 mg by mouth daily.) 30 tablet 12  . sildenafil (VIAGRA) 50 MG tablet Take 1-2 tablets (50-100 mg total) by mouth daily as needed. 90 tablet 3  . tamsulosin (FLOMAX) 0.4 MG CAPS capsule Take 0.4 mg by mouth daily.    . TRULICITY 1.5 IO/2.7OJ SOPN SMARTSIG:0.5 Milliliter(s) SUB-Q Once a Week    . VIIBRYD 40 MG TABS TAKE 1 TABLET BY MOUTH EVERY DAY 90 tablet 0   No current facility-administered medications for this visit.     Musculoskeletal: Strength & Muscle Tone: UTA Gait & Station: UTA Patient leans: N/A  Psychiatric Specialty Exam: Review of Systems  Psychiatric/Behavioral: Positive for dysphoric mood and sleep disturbance.  All other systems reviewed and are negative.   There were no vitals taken for this visit.There is no height or weight on file to calculate BMI.  General Appearance: Casual  Eye Contact:  Fair  Speech:  Clear and Coherent  Volume:  Normal  Mood:  Dysphoric coping , improving  Affect:  Congruent  Thought Process:  Goal Directed and Descriptions of Associations:  Intact  Orientation:  Full (Time, Place, and Person)  Thought Content: Logical   Suicidal Thoughts:  No, has a history of chronic self injurious thoughts, death wish  Homicidal Thoughts:  No  Memory:  Immediate;   Fair Recent;   Fair Remote;   Fair  Judgement:  Fair  Insight:  Fair  Psychomotor Activity:  Normal  Concentration:  Concentration: Fair and Attention Span: Fair  Recall:  [employer] of Knowledge: Fair  Language: Fair  Akathisia:  No  Handed:  Right  AIMS (if indicated): UTA  Assets:  Communication Skills Desire for Improvement Housing Social Support  ADL's:  Intact  Cognition: WNL  Sleep:  Poor, nightmares   Screenings: GAD-7   Flowsheet Row Counselor from 03/10/2020 in [facility] Visit from 04/16/2019 in [facility]  Total GAD-7 Score 13 20    PHQ2-9   [place] Video Visit from 08/02/2020 in [place] Counselor from 03/10/2020 in [place] Video Visit from 10/13/2019 in [place] Office Visit from 08/23/2019 in [facility] Visit from 04/16/2019 in [place]  PHQ-2 Total Score $RemoveBef'3 5 3 6 6  'dycIQARdRd$ PHQ-9 Total Score $RemoveBef'8 15 11 23 25    'jpIiUsOxxw$ Flowsheet Row Video Visit from 08/02/2020 in [place] RISK CATEGORY Low Risk       Assessment and Plan: Mario Coronado Holsapple is a 45 year old Caucasian male, employed, [place], has a history of MDD, OSA on CPAP, diabetes melitis, arthritis was evaluated by telemedicine today.  Patient is biologically predisposed given his history of trauma, family history of mental health problems.  Patient is currently struggling with situational stressors of quitting his job recently and does have sleep problems.  Patient will benefit from the following plan.  Plan MDD in partial remission Viibryd 40 mg p.o. daily Lithium 300 mg  p.o. twice daily Lithium level-04/18/2020-0.22-subtherapeutic Lamictal 150 mg p.o. daily Hydroxyzine 50 to 100 mg p.o. nightly as needed  GAD-some progress Continue CBT with Ms. Christina Hussami.  Although patient states his recent job loss does not bother him he still does have sleep problems which started again recently which likely indicates possible anxiety from the recent stressors. I have communicated with therapist regarding having the patient for a session sooner due to recent situational changes. Hydroxyzine 50 to 100 mg p.o. nightly as needed Continue Xanax as needed available as prescribed per primary care provider  Social anxiety disorder-improving Continue CBT  Insomnia-unstable Increase prazosin to 2 mg p.o. nightly for nightmares Will increase Lunesta to 2 mg p.o. nightly however advised patient to start taking the Lunesta after a week or so since the prazosin is currently being increased. Patient to monitor himself for drug to drug interaction with Flomax, lightheadedness dizziness and other side effects. Continue CBT  High risk medication use-I have reviewed TSH-dated 07/31/2020-within normal limits.  BUN creatinine as well as lithium level even though ordered was never completed.  Will fax lab slip again to [facility].  I have communicated with [facility] CMA.  Also discussed with patient.  Patient agrees to go to [facility] to get it done.  Provided supportive psychotherapy.  Patient currently denies any suicide intent or plan, does have a history of chronic death wish, passive thoughts.  Patient however reports he is able to cope with  it and it has gotten better.  Crisis plan discussed with patient.  Patient does have a plan in place for his chronic suicidal thoughts.  We will continue to coordinate care with therapist.  Follow-up in clinic in 2 weeks or sooner if needed.  I have spent atleast 25 minutes face to face by video with patient today. More than 50 % of the time was  spent for preparing to see the patient ( e.g., review of test, records ), obtaining and to review and separately obtained history , ordering medications and test ,psychoeducation and supportive psychotherapy and care coordination,as well as documenting clinical information in electronic health record,interpreting and communication of test results This note was generated in part or whole with voice recognition software. Voice recognition is usually quite accurate but there are transcription errors that can and very often do occur. I apologize for any typographical errors that were not detected and corrected.      Ursula Alert, MD 08/02/2020, 10:08 AM

## 2020-08-03 ENCOUNTER — Other Ambulatory Visit: Payer: Self-pay | Admitting: Family Medicine

## 2020-08-06 MED ORDER — OXYCODONE-ACETAMINOPHEN 7.5-325 MG PO TABS: 1.0000 | ORAL_TABLET | ORAL | 0 refills | Status: DC | PRN

## 2020-08-08 ENCOUNTER — Other Ambulatory Visit: Payer: Self-pay | Admitting: Family Medicine

## 2020-08-08 DIAGNOSIS — F419 Anxiety disorder, unspecified: Secondary | ICD-10-CM

## 2020-08-08 NOTE — Telephone Encounter (Signed)
Requested medication (s) are due for refill today: no  Requested medication (s) are on the active medication list:yes  Last refill: 07/17/2020  Future visit scheduled: yes  Notes to clinic: this refill cannot be delegated    Requested Prescriptions  Pending Prescriptions Disp Refills   predniSONE (DELTASONE) 10 MG tablet [Pharmacy Med Name: PREDNISONE 10 MG TABLET] 42 tablet     Sig: TAKE 6 TABLETS FOR 2 DAYS, THEN 5 FOR 2 DAYS, THEN 4 FOR 2 DAYS, THEN 3 FOR 2 DAYS, THEN 2 FOR 2 DAYS, THEN 1 FOR 2 DAYS.      Not Delegated - Endocrinology:  Oral Corticosteroids Failed - 08/08/2020 12:15 PM      Failed - This refill cannot be delegated      Passed - Last BP in normal range    BP Readings from Last 1 Encounters:  04/18/20 112/75          Passed - Valid encounter within last 6 months    Recent Outpatient Visits           4 weeks ago Cough   [place], [place], PA-C   3 months ago Chronic hip pain, bilateral   [facility] Birdie Sons, MD   6 months ago Type 2 diabetes mellitus with ketoacidosis without coma, without long-term current use of insulin [facility])   [facility] Birdie Sons, MD   6 months ago Weakness   [facility] Fenton Malling M, [place]   7 months ago Type 2 diabetes mellitus without complication, without long-term current use of insulin [facility])   [place], [place]                  ALPRAZolam Duanne Moron) 0.5 MG tablet [Pharmacy Med Name: ALPRAZOLAM 0.5 MG TABLET] 30 tablet 2    Sig: TAKE 1 TABLET (0.5 MG TOTAL) BY MOUTH EVERY 4 (FOUR) HOURS AS NEEDED. FOR ANXIETY      Not Delegated - Psychiatry:  Anxiolytics/Hypnotics Failed - 08/08/2020 12:15 PM      Failed - This refill cannot be delegated      Failed - Urine Drug Screen completed in last 360 days      Passed - Valid encounter within last 6 months    Recent Outpatient Visits           4 weeks ago  Cough   [place], [place], PA-C   3 months ago Chronic hip pain, bilateral   [facility] Birdie Sons, MD   6 months ago Type 2 diabetes mellitus with ketoacidosis without coma, without long-term current use of insulin [facility])   [facility] Birdie Sons, MD   6 months ago Weakness   [facility] Fenton Malling M, [place]   7 months ago Type 2 diabetes mellitus without complication, without long-term current use of insulin [facility])   [facility] Birdie Sons, MD

## 2020-08-09 ENCOUNTER — Ambulatory Visit (INDEPENDENT_AMBULATORY_CARE_PROVIDER_SITE_OTHER): Payer: 59 | Admitting: Licensed Clinical Social Worker

## 2020-08-09 ENCOUNTER — Other Ambulatory Visit: Payer: Self-pay | Admitting: Psychiatry

## 2020-08-09 ENCOUNTER — Other Ambulatory Visit: Payer: Self-pay

## 2020-08-09 DIAGNOSIS — Z634 Disappearance and death of family member: Secondary | ICD-10-CM

## 2020-08-09 DIAGNOSIS — F331 Major depressive disorder, recurrent, moderate: Secondary | ICD-10-CM

## 2020-08-09 DIAGNOSIS — F411 Generalized anxiety disorder: Secondary | ICD-10-CM | POA: Diagnosis not present

## 2020-08-09 DIAGNOSIS — G4700 Insomnia, unspecified: Secondary | ICD-10-CM

## 2020-08-09 NOTE — Progress Notes (Signed)
Virtual Visit via Video Note  I connected with Raymond Rose on 08/09/20 at  8:00 AM EST by a video enabled telemedicine application and verified that I am speaking with the correct person using two identifiers.  Location: Patient: home Provider: remote office [place], [place])   I discussed the limitations of evaluation and management by telemedicine and the availability of in person appointments. The patient expressed understanding and agreed to proceed.  I discussed the assessment and treatment plan with the patient. The patient was provided an opportunity to ask questions and all were answered. The patient agreed with the plan and demonstrated an understanding of the instructions.   The patient was advised to call back or seek an in-person evaluation if the symptoms worsen or if the condition fails to improve as anticipated.  I provided 45 minutes of non-face-to-face time during this encounter.   Kendyn Zaman R Dineen Conradt, LCSW    THERAPIST PROGRESS NOTE  Session Time: 8-8:45a  Participation Level: Active  Behavioral Response: NeatAlertDepressed  Type of Therapy: Individual Therapy  Treatment Goals addressed: Coping  Interventions: CBT, DBT, Supportive and Other: trauma-focused; grief counseling  Summary: Raymond Rose is a 45 y.o. male who presents with symptoms associated with depression, anxiety, and bereavement.  Pt reports that his recent mood has been stable and that he is managing stress and anxiety well. Pt reports that psychiatrist just changed medication recently and that he is hoping that sleep quality and quantity will change.  Allowed pt safe space to explore and express thoughts and feelings surrounding recent life events. Pt reports that he made the decision to leave his job because it was not a good fit for him. "They were micromanaging everything, down to where they wanted [place] to send text messages if we left to go to the bathroom". Discussed different  work personalities and pt doesn't feel that he had any issues with anyone in particular--just felt the atmosphere was toxic. Pt states that he is not feeling bad about the change--"It was my choice". Allowed pt to weigh out pros and cons of big life decisions--he is currently seeking another job and has already interviewed at several other places.   Discussed suicidal thoughts--pt admits that he frequently has them but always has. "I don't really want to do anything--its just thoughts". Discussed impulsivity and substance use when feeling suicidal. Pt reports that he doesn't drink alcohol unless he is with his wife or playing golf--special occasions. Pt states that there has been some alcoholism in the family, so he keeps a close eye on that. "I'm not a big drinker". Reviewed how to identify when pt is in suicidal crisis and where to reach out for help. Pt reflected understanding.  Pt has memorial service for mother set up March 19th--helped pt process through thoughts/feelings of grief. Pt compared/contrasted deaths of mother and father.   Allowed pt to process through some childhood bullying/pain that pt feels has influences current levels of mistrust and inability to get close to others. Explored relationships throughout years--pt has been with wife ever since high school/college. Assisted pt in identifying happy/positive/moments of joy throughout life timeline.   Suicidal/Homicidal: No  Therapist Response: Neziah is continuing to grieve loss of mother in a healthy way. Admiral is trying hard to engage socially and is trying to engage in recreational activities that could improve his mood. Asaiah is able to articulate how he is feeling and identify coping mechanisms that have worked for him in the past--and is able to  identify coping skills that have not been very helpful for him in the past.  Pt is able to explore his suicidal thoughts in such a way that is not distressing to him and is able to identify  when to reach out for additional crisis resources. These behaviors are indicative of developing progress. Treatment to continue as indicated  Plan: Return again in 3 weeks. Revised treatment plan.  Diagnosis: Axis I: Major depressive disorder, recurrent, moderate; Generalized anxiety disorder; Bereavement    Axis II: No diagnosis    Rachel Bo Anna Beaird, LCSW 08/09/2020

## 2020-08-11 LAB — BUN+CREAT
BUN/Creatinine Ratio: 20 (ref 9–20)
BUN: 15 mg/dL (ref 6–24)
Creatinine, Ser: 0.75 mg/dL — ABNORMAL LOW (ref 0.76–1.27)
GFR calc Af Amer: 131 mL/min/{1.73_m2} (ref >59)
GFR calc non Af Amer: 113 mL/min/{1.73_m2} (ref >59)

## 2020-08-14 ENCOUNTER — Telehealth: Payer: Self-pay

## 2020-08-14 NOTE — Telephone Encounter (Signed)
faxed and confirmed labwork order for the lithium sent to the [place] lab. pt is the one who told me to fax there. ([facility] was sent a fax with the orders with a not that this was the 3rd person that has not had all of their orders done.)

## 2020-08-16 ENCOUNTER — Encounter: Payer: Self-pay | Admitting: Psychiatry

## 2020-08-16 ENCOUNTER — Other Ambulatory Visit: Payer: Self-pay

## 2020-08-16 ENCOUNTER — Other Ambulatory Visit: Payer: Self-pay | Admitting: Family Medicine

## 2020-08-16 ENCOUNTER — Telehealth (INDEPENDENT_AMBULATORY_CARE_PROVIDER_SITE_OTHER): Payer: 59 | Admitting: Psychiatry

## 2020-08-16 DIAGNOSIS — F411 Generalized anxiety disorder: Secondary | ICD-10-CM | POA: Diagnosis not present

## 2020-08-16 DIAGNOSIS — F5105 Insomnia due to other mental disorder: Secondary | ICD-10-CM | POA: Diagnosis not present

## 2020-08-16 DIAGNOSIS — F4010 Social phobia, unspecified: Secondary | ICD-10-CM

## 2020-08-16 DIAGNOSIS — F3341 Major depressive disorder, recurrent, in partial remission: Secondary | ICD-10-CM

## 2020-08-16 DIAGNOSIS — Z79899 Other long term (current) drug therapy: Secondary | ICD-10-CM

## 2020-08-16 MED ORDER — LITHIUM CARBONATE 300 MG PO CAPS: 300.0000 mg | ORAL_CAPSULE | Freq: Two times a day (BID) | ORAL | 0 refills | Status: DC

## 2020-08-16 NOTE — Progress Notes (Signed)
Virtual Visit via Video Note  I connected with Raymond Rose on 08/16/20 at  1:20 PM EST by a video enabled telemedicine application and verified that I am speaking with the correct person using two identifiers.  Location Provider Location : [facility] Patient Location : Car  Participants: Patient , Provider   I discussed the limitations of evaluation and management by telemedicine and the availability of in person appointments. The patient expressed understanding and agreed to proceed.   I discussed the assessment and treatment plan with the patient. The patient was provided an opportunity to ask questions and all were answered. The patient agreed with the plan and demonstrated an understanding of the instructions.   The patient was advised to call back or seek an in-person evaluation if the symptoms worsen or if the condition fails to improve as anticipated.  [facility] MD OP Progress Note  08/16/2020 9:36 PM Raymond Rose  MRN:  [id number]  Chief Complaint:  Chief Complaint    Follow-up     HPI: Raymond Rose is a 45 year old Caucasian male, married, lives in [place], has a history of MDD, GAD, social anxiety disorder, sleep apnea, cyclical vomiting, diabetes melitis, erectile dysfunction was evaluated by telemedicine today.  Patient today reports he has interviewed for different job opportunities and was offered a new job with [employer].  He is planning to start a job soon.  He reports he looks forward to that.  He is also planning to take a vacation with his wife soon.  Patient reports overall his mood symptoms as improving.  He reports his depression, lack of motivation has improved.  Patient reports he does have low energy on and off however that is also getting better.  Since increasing the Lunesta as well as the prazosin his sleep has gotten better.  He reports his nightmares are improving.  He has not had any nightmares for the past 2 days.  Patient denies any  suicidality.  Patient denies any homicidality.  Patient denies any perceptual disturbances.  Patient denies any side effects to medications.  Patient reports psychotherapy sessions as going well.  Reviewed and discussed labs-BUN/creatinine.  Discussed that lithium level was never completed even though it was faxed over to the Lab.  He agrees to get it done however wants to fax to another lab.  Patient denies any other concerns today.  Visit Diagnosis:    ICD-10-CM   1. MDD (major depressive disorder), recurrent, in partial remission (HCC)  F33.41 lithium carbonate 300 MG capsule  2. GAD (generalized anxiety disorder)  F41.1   3. Social anxiety disorder  F40.10   4. Insomnia due to mental disorder  F51.05   5. High risk medication use  Z79.899 Lithium level    Past Psychiatric History: I have reviewed past psychiatric history from my progress note on 04/21/2019.  Past trials of Lexapro, Wellbutrin, Elavil, Effexor, Zyprexa, Seroquel  Past Medical History:  Past Medical History:  Diagnosis Date  . Anxiety   . Arthritis   . COVID-19 06/2019  . Cyclical vomiting   . Depression   . OSA on CPAP   . Vertigo 01/03/2015    Past Surgical History:  Procedure Laterality Date  . 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 CHOLECYSTECTOMY  2010   lap Cholecystectomy  . 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 CT scan of Brain  12/23/2011   without contrast, [facility], Normal  . TONSILLECTOMY  1985    Family Psychiatric History: I have reviewed family psychiatric history from my progress note on 04/21/2019  Family History:  Family History  Problem Relation Age of Onset  . Depression Mother   . Heart attack Father 87  . Arthritis Other   . Alcohol abuse Other   . Lung cancer Other   . Hyperlipidemia Other   . CAD Other   . Stroke Other   . Hypertension Other   . Bipolar disorder Other     Social  History: I have reviewed social history from my progress note on 04/21/2019 Social History   Socioeconomic History  . Marital status: Married    Spouse name: Not on file  . Number of children: Not on file  . Years of education: Not on file  . Highest education level: Not on file  Occupational History  . Occupation: Banking    Comment: Engineer, manufacturing  Tobacco Use  . Smoking status: Former Smoker    Years: 5.00    Quit date: 06/17/1997    Years since quitting: 23.1  . Smokeless tobacco: Never Used  . Tobacco comment: started smoking at ahe 16, and quit at age 37  Vaping Use  . Vaping Use: Never used  Substance and Sexual Activity  . Alcohol use: Yes    Alcohol/week: 0.0 standard drinks    Comment: occasional alcohol use  . Drug use: No  . Sexual activity: Not on file  Other Topics Concern  . Not on file  Social History Narrative  . Not on file   Social Determinants of Health   Financial Resource Strain: Not on file  Food Insecurity: Not on file  Transportation Needs: Not on file  Physical Activity: Not on file  Stress: Not on file  Social Connections: Not on file    Allergies:  Allergies  Allergen Reactions  . Bupropion     Extreme anxiety  . Morphine     Metabolic Disorder Labs: Lab Results  Component Value Date   HGBA1C 10.1 (H) 01/23/2020   MPG 243.17 01/23/2020   No results found for: PROLACTIN Lab Results  Component Value Date   CHOL 173 05/24/2019   TRIG 718 (HH) 05/24/2019   HDL 30 (L) 05/24/2019   CHOLHDL 5.8 (H) 05/24/2019   LDLCALC 42 05/24/2019   LDLCALC Comment 10/30/2018   Lab Results  Component Value Date   TSH 2.000 07/31/2020   TSH 2.411 06/05/2020    Therapeutic Level Labs: Lab Results  Component Value Date   LITHIUM 0.22 (L) 04/18/2020   LITHIUM 0.15 (L) 03/07/2020   No results found for: VALPROATE No components found for:  CBMZ  Current Medications: Current Outpatient Medications  Medication Sig Dispense Refill  .  ALPRAZolam (XANAX) 0.5 MG tablet TAKE 1 TABLET (0.5 MG TOTAL) BY MOUTH EVERY 4 (FOUR) HOURS AS NEEDED. FOR ANXIETY 30 tablet 2  . BD PEN NEEDLE NANO 2ND GEN 32G X 4 MM MISC USE AS DIRECTED 4 TIMES A DAY 100 each 3  . blood glucose meter kit and supplies KIT Dispense based on patient and insurance preference. Use up to four times daily as directed. (FOR ICD-9 250.00, 250.01). 1 each 0  . Continuous Blood Gluc Sensor (FREESTYLE LIBRE 14 DAY SENSOR) MISC SMARTSIG:1 Each Topical Every 2 Weeks    . Dulaglutide 3 MG/0.5ML SOPN Inject into the skin.    Marland Kitchen eszopiclone (LUNESTA) 2 MG TABS tablet  Take 1 tablet (2 mg total) by mouth at bedtime as needed for sleep. Take immediately before bedtime 30 tablet 0  . hydrOXYzine (VISTARIL) 50 MG capsule Take 1-2 capsules (50-100 mg total) by mouth at bedtime as needed. For sleep 60 capsule 1  . insulin aspart (NOVOLOG FLEXPEN) 100 UNIT/ML FlexPen Inject up to 10 units three times daily as directed by physician 15 mL 5  . ketoconazole (NIZORAL) 2 % cream APPLY TO AFFECTED AREA ONCE DAILY FOR 7 DAYS AS NEEDED (Patient taking differently: Apply 1 application topically daily. APPLY TO AFFECTED AREA ONCE DAILY FOR 7 DAYS AS NEEDED) 15 g 3  . lamoTRIgine (LAMICTAL) 150 MG tablet Take 1 tablet (150 mg total) by mouth daily. 90 tablet 0  . LEVEMIR FLEXTOUCH 100 UNIT/ML FlexPen Inject 28 Units into the skin daily. 15 mL 5  . lithium carbonate 300 MG capsule Take 1 capsule (300 mg total) by mouth 2 (two) times daily with a meal. 180 capsule 0  . metFORMIN (GLUCOPHAGE-XR) 500 MG 24 hr tablet TAKE 2 TABLETS BY MOUTH TWICE A DAY 120 tablet 3  . omeprazole (PRILOSEC) 40 MG capsule TAKE 1 CAPSULE BY MOUTH EVERY DAY 90 capsule 0  . oxyCODONE-acetaminophen (PERCOCET) 7.5-325 MG tablet Take 1 tablet by mouth every 4 (four) hours as needed. 60 tablet 0  . prazosin (MINIPRESS) 2 MG capsule Take 1 capsule (2 mg total) by mouth at bedtime. 30 capsule 1  . predniSONE (DELTASONE) 10 MG tablet  TAKE 6 TABLETS FOR 2 DAYS, THEN 5 FOR 2 DAYS, THEN 4 FOR 2 DAYS, THEN 3 FOR 2 DAYS, THEN 2 FOR 2 DAYS, THEN 1 FOR 2 DAYS. 42 tablet 0  . promethazine (PHENERGAN) 25 MG tablet TAKE 1 TABLET (25 MG TOTAL) BY MOUTH EVERY 6 (SIX) HOURS AS NEEDED FOR NAUSEA OR VOMITING. 20 tablet 1  . rosuvastatin (CRESTOR) 20 MG tablet TAKE 1 TABLET BY MOUTH EVERY DAY (Patient taking differently: Take 20 mg by mouth daily.) 30 tablet 12  . sildenafil (VIAGRA) 50 MG tablet Take 1-2 tablets (50-100 mg total) by mouth daily as needed. 90 tablet 3  . tamsulosin (FLOMAX) 0.4 MG CAPS capsule Take 0.4 mg by mouth daily.    . TRULICITY 1.5 VH/8.4ON SOPN SMARTSIG:0.5 Milliliter(s) SUB-Q Once a Week    . VIIBRYD 40 MG TABS TAKE 1 TABLET BY MOUTH EVERY DAY 90 tablet 0   No current facility-administered medications for this visit.     Musculoskeletal: Strength & Muscle Tone: UTA Gait & Station: UTA Patient leans: N/A  Psychiatric Specialty Exam: Review of Systems  Psychiatric/Behavioral: The patient is nervous/anxious.   All other systems reviewed and are negative.   There were no vitals taken for this visit.There is no height or weight on file to calculate BMI.  General Appearance: Casual  Eye Contact:  Fair  Speech:  Clear and Coherent  Volume:  Normal  Mood:  Anxious  Affect:  Congruent  Thought Process:  Goal Directed and Descriptions of Associations: Intact  Orientation:  Full (Time, Place, and Person)  Thought Content: Logical   Suicidal Thoughts:  No  Homicidal Thoughts:  No  Memory:  Immediate;   Fair Recent;   Fair Remote;   Fair  Judgement:  Fair  Insight:  Fair  Psychomotor Activity:  Normal  Concentration:  Concentration: Fair and Attention Span: Fair  Recall:  [employer] of Knowledge: Fair  Language: Fair  Akathisia:  No  Handed:  Right  AIMS (if  indicated): UTA  Assets:  Communication Skills Desire for Improvement Housing Social Support  ADL's:  Intact  Cognition: WNL  Sleep:   Improving   Screenings: GAD-7   Flowsheet Row Counselor from 03/10/2020 in [place] Visit from 04/16/2019 in [facility]  Total GAD-7 Score 13 20    PHQ2-9   Flowsheet Row Video Visit from 08/16/2020 in [place] Video Visit from 08/02/2020 in [place] from 03/10/2020 in [place] Video Visit from 10/13/2019 in [place] Office Visit from 08/23/2019 in [place]  PHQ-2 Total Score _0 PHQ-9 Total Score _1 Flowsheet Row Counselor from 08/09/2020 in [place] Video Visit from 08/02/2020 in [place] Low Risk Low Risk       Assessment and Plan: Raymond Rose is a 45 year old Caucasian male, employed, lives in [place], has a history of MDD, OSA on CPAP, diabetes melitis, arthritis was evaluated by telemedicine today.  Patient is biologically predisposed given history of trauma, family history of mental health problems.  Patient is currently making progress.  Plan as noted below.  Plan MDD in  remission Viibryd 40 mg p.o. daily Lithium 300 mg p.o. twice daily Lithium level-04/18/2020-0.22-subtherapeutic Lamictal 150 mg p.o. daily  GAD-improving Continue CBT with Ms. Christina Hussami Hydroxyzine 50 to 100 mg p.o. nightly as needed Continue Xanax as needed as prescribed per primary care provider.  Social anxiety disorder-improving Continue CBT  Insomnia-improving Prazosin 2 mg p.o. nightly for nightmares Lunesta 2 mg p.o. nightly Continue CBT  High risk medication use-reviewed BUN/creatinine-dated 08/10/2020-0.75-low. Pending lab data lithium level.  Will order lithium level again-will fax it to [facility] lab per patient request.  Follow-up in clinic in 1 month or sooner if  needed.  I have spent atleast 30 minutes with patient today. More than 50 % of the time was spent for preparing to see the patient ( e.g., review of test, records ),  ordering medications and test ,psychoeducation and supportive psychotherapy and care coordination,as well as documenting clinical information in electronic health record,interpreting and communication of test results This note was generated in part or whole with voice recognition software. Voice recognition is usually quite accurate but there are transcription errors that can and very often do occur. I apologize for any typographical errors that were not detected and corrected.       Ursula Alert, MD 08/16/2020, 9:36 PM

## 2020-08-16 NOTE — Telephone Encounter (Signed)
faxed and confirmed labwork sent to [facility] Lab lithium Levels  dx z79.899

## 2020-08-18 MED ORDER — OXYCODONE-ACETAMINOPHEN 7.5-325 MG PO TABS: 1.0000 | ORAL_TABLET | ORAL | 0 refills | Status: DC | PRN

## 2020-08-18 NOTE — Telephone Encounter (Signed)
Please review. Thanks!  

## 2020-08-21 ENCOUNTER — Telehealth: Payer: Self-pay

## 2020-08-21 DIAGNOSIS — E119 Type 2 diabetes mellitus without complications: Secondary | ICD-10-CM

## 2020-08-21 MED ORDER — METFORMIN HCL 500 MG PO TABS: 500.0000 mg | ORAL_TABLET | Freq: Two times a day (BID) | ORAL | 0 refills | Status: DC

## 2020-08-21 NOTE — Telephone Encounter (Signed)
Pt called and talked to pt's wife, as she answered. Rx refill has been sent to specified pharmacy below for one month. Pt was scheduled for OV since he has not had an A1C in a while in order to qualify for full Rx refill.

## 2020-08-21 NOTE — Telephone Encounter (Signed)
Copied from [place] [id number]. Topic: General - Other >> Aug 21, 2020 11:00 AM Greggory Keen D wrote: Reason for CRM:   Pt is in [place] awaiting to go on a cruise in an hour and realized he left his metformin 500 mg at home.  He wants to know if Dr. Caryn Section will send a rx of the [facility] to  [facility] [address] [place]   [facility] [phone]  They are going to be in port about another hour before they leave [place] for the cruise.  CB#  [phone]

## 2020-08-28 ENCOUNTER — Other Ambulatory Visit: Payer: Self-pay | Admitting: Family Medicine

## 2020-08-29 MED ORDER — OXYCODONE-ACETAMINOPHEN 7.5-325 MG PO TABS: 1.0000 | ORAL_TABLET | ORAL | 0 refills | Status: DC | PRN

## 2020-08-31 ENCOUNTER — Other Ambulatory Visit: Payer: Self-pay

## 2020-08-31 ENCOUNTER — Telehealth: Payer: Self-pay | Admitting: Psychiatry

## 2020-08-31 ENCOUNTER — Ambulatory Visit (INDEPENDENT_AMBULATORY_CARE_PROVIDER_SITE_OTHER): Payer: 59 | Admitting: Licensed Clinical Social Worker

## 2020-08-31 ENCOUNTER — Other Ambulatory Visit
Admission: RE | Admit: 2020-08-31 | Discharge: 2020-08-31 | Disposition: A | Discharge status: Home / Self Care | Payer: 59 | Source: Ambulatory Visit | Attending: Psychiatry | Admitting: Psychiatry

## 2020-08-31 ENCOUNTER — Telehealth: Payer: Self-pay | Admitting: Licensed Clinical Social Worker

## 2020-08-31 DIAGNOSIS — F331 Major depressive disorder, recurrent, moderate: Secondary | ICD-10-CM

## 2020-08-31 DIAGNOSIS — E1110 Type 2 diabetes mellitus with ketoacidosis without coma: Secondary | ICD-10-CM | POA: Diagnosis present

## 2020-08-31 DIAGNOSIS — Z79899 Other long term (current) drug therapy: Secondary | ICD-10-CM | POA: Insufficient documentation

## 2020-08-31 DIAGNOSIS — F3341 Major depressive disorder, recurrent, in partial remission: Secondary | ICD-10-CM

## 2020-08-31 DIAGNOSIS — F603 Borderline personality disorder: Secondary | ICD-10-CM

## 2020-08-31 LAB — BASIC METABOLIC PANEL
Anion gap: 10 (ref 5–15)
BUN: 13 mg/dL (ref 6–20)
CO2: 24 mmol/L (ref 22–32)
Calcium: 9.4 mg/dL (ref 8.9–10.3)
Chloride: 102 mmol/L (ref 98–111)
Creatinine, Ser: 0.78 mg/dL (ref 0.61–1.24)
GFR, Estimated: >60 mL/min (ref >60)
Glucose, Bld: 329 mg/dL — ABNORMAL HIGH (ref 70–99)
Potassium: 4.2 mmol/L (ref 3.5–5.1)
Sodium: 136 mmol/L (ref 135–145)

## 2020-08-31 LAB — LITHIUM LEVEL: Lithium Lvl: 0.29 mmol/L — ABNORMAL LOW (ref 0.60–1.20)

## 2020-08-31 MED ORDER — LITHIUM CARBONATE 300 MG PO CAPS: 300.0000 mg | ORAL_CAPSULE | ORAL | 1 refills | Status: DC

## 2020-08-31 NOTE — Telephone Encounter (Addendum)
Per pt request, LCSW clinician reached out to [name] Hurrell to discuss care recommendations after todays outpatient therapy session.   Left HIPPA compliant message.  Jeanmarie Plant, MSW, LCSW Outpatient Therapist/Triage Specialist    5:15pm:  LCSW clinician called pt at home to verify safety after not establishing contact with wife and pt stated that he is home, is ok, and feeling safe.   Discussed options for higher-level of care due to pt disclosure of recent escalation in depression symptoms. Inpatient treatment was recommended, but pt states he would like to discuss it with his wife first and that he may decide not to follow through because he is supposed to start a new job on Monday.   Discussed intensive outpatient, partial hospitalization, and inpatient--pt not really interested in any of those and states that he wants to discuss with wife.   Pt states that he currently has SI with no concrete plans and no intent to follow through. Pt has no HI or AVH. Pt not paranoid.   Dr. Shea Evans present throughout conversation and is willing to increase levels of lithium that pt is taking. Pt seems accepting of this and verbally states that he is not suicidal at time of phone call.  Jeanmarie Plant, MSW, LCSW Outpatient Therapist/Triage Specialist

## 2020-08-31 NOTE — Addendum Note (Signed)
Addended by: Santiago Bur on: 08/31/2020 12:39 PM   Modules accepted: Orders

## 2020-08-31 NOTE — Progress Notes (Addendum)
Virtual Visit via Video Note  I connected with Raymond Rose on 08/31/20 at  3:00 PM EDT by a video enabled telemedicine application and verified that I am speaking with the correct person using two identifiers.  Location: Patient: home Provider: [facility]   I discussed the limitations of evaluation and management by telemedicine and the availability of in person appointments. The patient expressed understanding and agreed to proceed.  I discussed the assessment and treatment plan with the patient. The patient was provided an opportunity to ask questions and all were answered. The patient agreed with the plan and demonstrated an understanding of the instructions.   The patient was advised to call back or seek an in-person evaluation if the symptoms worsen or if the condition fails to improve as anticipated.  I provided 37 minutes of non-face-to-face time during this encounter.   Buck Mcaffee R Joshua Soulier, LCSW    THERAPIST PROGRESS NOTE  Session Time: 3-3:37p  Participation Level: Active  Behavioral Response: NeatAlertDepressed, Dysphoric and Hopeless  Type of Therapy: Individual Therapy  Treatment Goals addressed: Coping  Interventions: CBT and Solution Focused  Summary: Raymond Rose is a 45 y.o. male who presents with continuing/escalating symptoms consistent with depression diagnosis.    Suicidal/Homicidal: Yes--pt reports that he has suicidal ideation on a daily basis. Pt reports that he currently does not feel like he is a danger to himself but due to recent escalation of symptoms feels he needs a higher level of care (inpatient).   Therapist Response: Donnivan is reporting an increase in the intensity of his depression symptoms. This is not reflective of progress. Pt needs referral to higher level of care.   Plan: Return again in 2 weeks. Discussed intensive outpatient treatment, partial hospitalization treatment, and voluntary inpatient treatment. Discussed [facility] Pt reports that he is not a danger to himself at time of assessment and that he wants to discuss this with his wife prior to making any decisions. Pt gave verbal consent for LCSW counselor to reach out to wife for collateral information. Reviewed safety plan and pt stated to clinician that he has no intent to harm himself--wants to get more intense help.  Diagnosis: Axis I: Major Depression, Recurrent severe    Axis II: No diagnosis    [place], LCSW 08/31/2020

## 2020-08-31 NOTE — Telephone Encounter (Signed)
Received message from his therapist Ms. Christina Hussami that patient was having suicidal thoughts as well as worsening depressive symptoms.  Raymond Rose had given wife a call however had to leave voicemail.  When she did not get a call back from wife she tried to call patient again.  Writer was also on the call, patient was placed on speaker phone.  Raymond Rose tried to educate patient regarding the need for a higher level of care like partial hospitalization or inpatient admission.  Writer attempted to clarify with patient his current symptoms.  Patient reported to Probation officer that he has chronic suicidal thoughts which is very baseline for him.  He has been struggling with this for the past several years.  He however reports he does not feel this is worse than any other time.  He denies any active planning.  He does not believe he is going to act on any thoughts to hurt himself.  He reports he wants to go to work on Monday, he is going to start the new job.  He hence does not believe going to the inpatient or partial hospitalization program right now is the right decision.  He wants to make some medication changes at this time.  He reports he wants to go up on the lithium.  He was able to get lithium levels done-reviewed-dated 08/31/2020-patient's lithium level is subtherapeutic.  Unknown if patient is compliant. Will increase lithium to 300 mg in the morning and 600 mg in the evening.  Will order another lithium level, patient to go to lab 5 days after starting the new dosage in the morning.  Discussed with patient that he should call us with his wife tomorrow to discuss further plan.  Crisis plan discussed with patient.  He agrees to go to the nearest emergency department, call his wife if his symptoms get worse.  Patient agrees.

## 2020-09-01 ENCOUNTER — Telehealth: Payer: Self-pay

## 2020-09-01 ENCOUNTER — Telehealth: Payer: Self-pay | Admitting: Licensed Clinical Social Worker

## 2020-09-01 NOTE — Telephone Encounter (Signed)
Called pt to follow up after yesterday's counseling session and consultation with psychiatrist. Pt states that he decided that he wanted to get out of the house so he is currently out and about running errands and is picking up his new prescription.  Pt states that his overall mood is not better, but it is not worse.  Discussed that we want to make sure that pt feels well supported and that we are willing to try multiple interventions to help him meet his goals and feel better.  Discussed taking meds for 1.5-2 weeks and then assessing differences.  Discussed possibility of having a family meeting involving pt, pts wife, psychiatrist, and counselor to review all interventions that have been tried and were unsuccessful and other interventions that could possibly be successful.  Pt reflected understanding and is willing to make positive changes that can help him manage his depression and suicidal thoughts.  Jeanmarie Plant, MSW, LCSW Outpatient Therapist/Triage Specialist

## 2020-09-01 NOTE — Telephone Encounter (Signed)
faxed and confirm labwork order [facility] to [facility] [phone]. lithium level  dx: W80.881

## 2020-09-01 NOTE — Telephone Encounter (Signed)
Thank you :)

## 2020-09-05 ENCOUNTER — Ambulatory Visit: Payer: Self-pay | Admitting: Family Medicine

## 2020-09-05 ENCOUNTER — Other Ambulatory Visit: Payer: Self-pay | Admitting: Family Medicine

## 2020-09-05 DIAGNOSIS — E1110 Type 2 diabetes mellitus with ketoacidosis without coma: Secondary | ICD-10-CM

## 2020-09-07 ENCOUNTER — Other Ambulatory Visit: Payer: Self-pay | Admitting: Family Medicine

## 2020-09-08 ENCOUNTER — Other Ambulatory Visit
Admission: RE | Admit: 2020-09-08 | Discharge: 2020-09-08 | Disposition: A | Discharge status: Home / Self Care | Payer: 59 | Source: Ambulatory Visit | Attending: Psychiatry | Admitting: Psychiatry

## 2020-09-08 DIAGNOSIS — Z79899 Other long term (current) drug therapy: Secondary | ICD-10-CM | POA: Insufficient documentation

## 2020-09-08 LAB — LITHIUM LEVEL: Lithium Lvl: 0.37 mmol/L — ABNORMAL LOW (ref 0.60–1.20)

## 2020-09-10 MED ORDER — OXYCODONE-ACETAMINOPHEN 7.5-325 MG PO TABS: 1.0000 | ORAL_TABLET | ORAL | 0 refills | Status: DC | PRN

## 2020-09-13 ENCOUNTER — Other Ambulatory Visit: Payer: Self-pay | Admitting: Psychiatry

## 2020-09-13 DIAGNOSIS — F5105 Insomnia due to other mental disorder: Secondary | ICD-10-CM

## 2020-09-14 ENCOUNTER — Ambulatory Visit: Payer: Self-pay | Admitting: Licensed Clinical Social Worker

## 2020-09-14 ENCOUNTER — Ambulatory Visit: Payer: 59 | Admitting: Licensed Clinical Social Worker

## 2020-09-19 ENCOUNTER — Other Ambulatory Visit: Payer: Self-pay | Admitting: Family Medicine

## 2020-09-20 ENCOUNTER — Telehealth: Payer: 59 | Admitting: Psychiatry

## 2020-09-20 MED ORDER — OXYCODONE-ACETAMINOPHEN 7.5-325 MG PO TABS: 1.0000 | ORAL_TABLET | ORAL | 0 refills | Status: DC | PRN

## 2020-09-22 ENCOUNTER — Other Ambulatory Visit: Payer: Self-pay | Admitting: Family Medicine

## 2020-09-22 NOTE — Telephone Encounter (Signed)
  Notes to clinic: medication that is requested is not on current medication list  Review for continued use and refill   Requested Prescriptions  Pending Prescriptions Disp Refills   predniSONE (DELTASONE) 10 MG tablet [Pharmacy Med Name: PREDNISONE 10 MG TABLET] 42 tablet 0    Sig: TAKE 6 TABLETS FOR 2 DAYS, THEN 5 FOR 2 DAYS, THEN 4 FOR 2 DAYS, THEN 3 FOR 2 DAYS, THEN 2 FOR 2 DAYS, THEN 1 FOR 2 DAYS.      Not Delegated - Endocrinology:  Oral Corticosteroids Failed - 09/22/2020 11:02 AM      Failed - This refill cannot be delegated      Passed - Last BP in normal range    BP Readings from Last 1 Encounters:  04/18/20 112/75          Passed - Valid encounter within last 6 months    Recent Outpatient Visits           2 months ago Cough   [place], [place], PA-C   5 months ago Chronic hip pain, bilateral   [facility] Birdie Sons, MD   7 months ago Type 2 diabetes mellitus with ketoacidosis without coma, without long-term current use of insulin [facility])   [facility] Birdie Sons, MD   7 months ago Weakness   [facility] [place] Malling M, [place]   8 months ago Type 2 diabetes mellitus without complication, without long-term current use of insulin [facility])   [facility] [place] Birdie Sons, MD       Future Appointments             In 1 week Fisher, Kirstie Peri, MD [facility], [place]

## 2020-09-25 ENCOUNTER — Other Ambulatory Visit: Payer: Self-pay | Admitting: Family Medicine

## 2020-09-26 ENCOUNTER — Other Ambulatory Visit: Payer: Self-pay | Admitting: Family Medicine

## 2020-09-26 DIAGNOSIS — E119 Type 2 diabetes mellitus without complications: Secondary | ICD-10-CM

## 2020-09-26 NOTE — Telephone Encounter (Signed)
Notes to clinic:  Review for continue use and refill Patient has upcoming appointment on 10/03/2020   Requested Prescriptions  Pending Prescriptions Disp Refills   promethazine (PHENERGAN) 25 MG tablet [Pharmacy Med Name: PROMETHAZINE 25 MG TABLET] 20 tablet 1    Sig: TAKE 1 TABLET (25 MG TOTAL) BY MOUTH EVERY 6 (SIX) HOURS AS NEEDED FOR NAUSEA OR VOMITING.      Not Delegated - Gastroenterology: Antiemetics Failed - 09/26/2020 10:57 AM      Failed - This refill cannot be delegated      Passed - Valid encounter within last 6 months    Recent Outpatient Visits           2 months ago Cough   [place], [place], PA-C   5 months ago Chronic hip pain, bilateral   [facility] Birdie Sons, MD   7 months ago Type 2 diabetes mellitus with ketoacidosis without coma, without long-term current use of insulin [facility])   [facility] Birdie Sons, MD   8 months ago Weakness   [facility] Fenton Malling M, [place]   8 months ago Type 2 diabetes mellitus without complication, without long-term current use of insulin ([place])   [place], MD       Future Appointments             In 1 week Fisher, Kirstie Peri, MD [facility], [facility]               predniSONE (DELTASONE) 10 MG tablet [Pharmacy Med Name: PREDNISONE 10 MG TABLET] 42 tablet 0    Sig: TAKE 6 TABLETS FOR 2 DAYS, THEN 5 FOR 2 DAYS, THEN 4 FOR 2 DAYS, THEN 3 FOR 2 DAYS, THEN 2 FOR 2 DAYS, THEN 1 FOR 2 DAYS.      Not Delegated - Endocrinology:  Oral Corticosteroids Failed - 09/26/2020 10:57 AM      Failed - This refill cannot be delegated      Passed - Last BP in normal range    BP Readings from Last 1 Encounters:  04/18/20 112/75          Passed - Valid encounter within last 6 months    Recent Outpatient Visits           2 months ago Cough   [place], [place], PA-C   5 months  ago Chronic hip pain, bilateral   [facility] Birdie Sons, MD   7 months ago Type 2 diabetes mellitus with ketoacidosis without coma, without long-term current use of insulin [facility])   [facility] Birdie Sons, MD   8 months ago Weakness   [facility] Fenton Malling M, [place]   8 months ago Type 2 diabetes mellitus without complication, without long-term current use of insulin [facility])   [place], MD       Future Appointments             In 1 week Fisher, Kirstie Peri, MD [facility], [facility]               metFORMIN (GLUCOPHAGE-XR) 500 MG 24 hr tablet [Pharmacy Med Name: METFORMIN HCL ER 500 MG TABLET] 120 tablet 3    Sig: TAKE 2 TABLETS BY MOUTH TWICE A DAY      Endocrinology:  Diabetes - Biguanides Failed - 09/26/2020 10:57 AM  Failed - HBA1C is between 0 and 7.9 and within 180 days    Hgb A1c MFr Bld  Date Value Ref Range Status  01/23/2020 10.1 (H) 4.8 - 5.6 % Final    Comment:    (NOTE) Pre diabetes:          5.7%-6.4%  Diabetes:              >6.4%  Glycemic control for   <7.0% adults with diabetes           Passed - Cr in normal range and within 360 days    Creatinine  Date Value Ref Range Status  09/07/2014 0.68 mg/dL Final    Comment:    3.66-4.40 NOTE: New Reference Range  08/23/14   09/07/2014 0.68 mg/dL Final    Comment:    3.47-4.25 NOTE: New Reference Range  08/23/14    Creatinine, Ser  Date Value Ref Range Status  08/31/2020 0.78 0.61 - 1.24 mg/dL Final   Creatinine, POC  Date Value Ref Range Status  02/14/2016 n/a mg/dL Final          Passed - eGFR in normal range and within 360 days    EGFR (African American)  Date Value Ref Range Status  09/07/2014 >60  Final  09/07/2014 >60  Final   GFR calc Af Amer  Date Value Ref Range Status  08/10/2020 131 >59 mL/min/1.73 Final    Comment:    **In accordance with recommendations from the  NKF-ASN Task force,**   [facility] is in the process of updating its eGFR calculation to the   2021 CKD-EPI creatinine equation that estimates kidney function   without a race variable.    EGFR (Non-African Amer.)  Date Value Ref Range Status  09/07/2014 >60  Final    Comment:    eGFR values <61mL/min/1.73 m2 may be an indication of chronic kidney disease (CKD). Calculated eGFR is useful in patients with stable renal function. The eGFR calculation will not be reliable in acutely ill patients when serum creatinine is changing rapidly. It is not useful in patients on dialysis. The eGFR calculation may not be applicable to patients at the low and high extremes of body sizes, pregnant women, and vegetarians.   09/07/2014 >60  Final    Comment:    eGFR values <59mL/min/1.73 m2 may be an indication of chronic kidney disease (CKD). Calculated eGFR is useful in patients with stable renal function. The eGFR calculation will not be reliable in acutely ill patients when serum creatinine is changing rapidly. It is not useful in patients on dialysis. The eGFR calculation may not be applicable to patients at the low and high extremes of body sizes, pregnant women, and vegetarians.    GFR, Estimated  Date Value Ref Range Status  08/31/2020 >60 >60 mL/min Final    Comment:    (NOTE) Calculated using the CKD-EPI Creatinine Equation (2021)           Passed - Valid encounter within last 6 months    Recent Outpatient Visits           2 months ago Cough   [facility] Osvaldo Angst M, PA-C   5 months ago Chronic hip pain, bilateral   [facility] Malva Limes, MD   7 months ago Type 2 diabetes mellitus with ketoacidosis without coma, without long-term current use of insulin [facility])   [facility] Malva Limes, MD   8 months ago Weakness  [place], [place]   8 months ago Type 2 diabetes mellitus without  complication, without long-term current use of insulin [facility])   [facility] Birdie Sons, MD       Future Appointments             In 1 week Fisher, Kirstie Peri, MD [facility], [place]

## 2020-10-02 ENCOUNTER — Other Ambulatory Visit: Payer: Self-pay | Admitting: Family Medicine

## 2020-10-02 MED ORDER — OXYCODONE-ACETAMINOPHEN 7.5-325 MG PO TABS: 1.0000 | ORAL_TABLET | ORAL | 0 refills | Status: DC | PRN

## 2020-10-03 ENCOUNTER — Other Ambulatory Visit: Payer: Self-pay

## 2020-10-03 ENCOUNTER — Ambulatory Visit (INDEPENDENT_AMBULATORY_CARE_PROVIDER_SITE_OTHER): Payer: 59 | Admitting: Family Medicine

## 2020-10-03 VITALS — BP 123/78 | HR 85 | Ht 72.0 in | Wt 340.0 lb

## 2020-10-03 DIAGNOSIS — G894 Chronic pain syndrome: Secondary | ICD-10-CM | POA: Diagnosis not present

## 2020-10-03 DIAGNOSIS — E1110 Type 2 diabetes mellitus with ketoacidosis without coma: Secondary | ICD-10-CM | POA: Diagnosis not present

## 2020-10-03 DIAGNOSIS — M79671 Pain in right foot: Secondary | ICD-10-CM

## 2020-10-03 DIAGNOSIS — E782 Mixed hyperlipidemia: Secondary | ICD-10-CM

## 2020-10-03 DIAGNOSIS — M545 Low back pain, unspecified: Secondary | ICD-10-CM | POA: Diagnosis not present

## 2020-10-03 DIAGNOSIS — G8929 Other chronic pain: Secondary | ICD-10-CM

## 2020-10-03 LAB — POCT GLYCOSYLATED HEMOGLOBIN (HGB A1C)
Estimated Average Glucose: 226
Hemoglobin A1C: 9.5 % — AB (ref 4.0–5.6)

## 2020-10-03 NOTE — Patient Instructions (Signed)
Titrate dose of Levemir to get fasting sugars below 120.     Check fasting blood sugar every day.    If fasting blood sugar is greater or equal to 200, take 2 units more than the day before.    If fasting blood sugar is greater or equal to 120, but less than 200, take 1 unit more than the day before.    If fasting blood sugar is less than 120, continue the same dose as the day before.    If fasting blood sugar is less than 100, take 2 units LESS than the day before.    If fasting blood sugar is less than 60, contact your healthcare provider. Do not take Insulin unless told otherwise by your provider.  Example:  Let's say that yesterday you took 10 units of Levemir.  You check your blood sugar today before eating anything and it is 210.  You will INCREASE your dose by 2 units, so you will take 12 units today.  The next day you check your fasting blood sugar and it is 150.  You will INCREASE your dose another 1 unit, so you will take 13 units.  The next day you check your fasting blood sugar and it is 110.  You will continue 13 units of Levemir per day.

## 2020-10-03 NOTE — Progress Notes (Signed)
Established patient visit   Patient: Raymond Rose   DOB: [birth year]   45 y.o. Male  MRN: [id number] Visit Date: 10/03/2020  Today's healthcare provider: [name], MD   Chief Complaint  Patient presents with  . Diabetes   Subjective    HPI  Diabetes Mellitus Type II, Follow-up  Lab Results  Component Value Date   HGBA1C 10.1 (H) 01/23/2020   HGBA1C 9.7 (A) 01/10/2020   HGBA1C 7.9 (A) 08/23/2019   Wt Readings from Last 3 Encounters:  10/03/20 (!) 340 lb (154.2 kg)  04/18/20 (!) 310 lb (140.6 kg)  02/08/20 (!) 322 lb (146.1 kg)   Last seen for diabetes 8 months ago.  Management since then includes now off of metformin, glipizide, Farxiga and Trulicity. He would like to go ahead and start management with endocrinologist. He is scheduled to follow up with Warnell Forester in may.  He reports excellent compliance with treatment. He is not having side effects.   Patient is having numbness in right hand and is having nerve pain from right foot to right lower leg.  Home blood sugar records: n/a  Episodes of hypoglycemia? No    Current insulin regiment: Levimir 3 times daily after eating, Novolog once daily Most Recent Eye Exam: 09/06/2015 Current exercise: golf Current diet habits: in general, an "unhealthy" diet  Pertinent Labs: Lab Results  Component Value Date   CHOL 173 05/24/2019   HDL 30 (L) 05/24/2019   LDLCALC 42 05/24/2019   TRIG 718 (HH) 05/24/2019   CHOLHDL 5.8 (H) 05/24/2019   Lab Results  Component Value Date   NA 136 08/31/2020   K 4.2 08/31/2020   CREATININE 0.78 08/31/2020   GFRNONAA >60 08/31/2020   GFRAA 131 08/10/2020   GLUCOSE 329 (H) 08/31/2020     ---------------------------------------------------------------------------------------------------  He also continues to have problems with chronic nausea and vomiting previously followed by [facility], but has not had follow up since last summer. He did try to return to work at  [employer] about a month ago, but he had to quit last week because he couldn't get the nausea and vomiting well enough  Controlled to work. A healthcare accomodation form was sent here, but he now states he doesn't need this done since he is not returning to work.    He continues to have chronic daily low back pain. 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  Has been taking oxycodone/apap two tablets TID and tolerating well with fair control of pain.      Medications: Outpatient Medications Prior to Visit  Medication Sig  . ALPRAZolam (XANAX) 0.5 MG tablet TAKE 1 TABLET (0.5 MG TOTAL) BY MOUTH EVERY 4 (FOUR) HOURS AS NEEDED. FOR ANXIETY  . BD PEN NEEDLE NANO 2ND GEN 32G X 4 MM MISC USE AS DIRECTED 4 TIMES A DAY  . blood glucose meter kit and supplies KIT Dispense based on patient and insurance preference. Use up to four times daily as directed. (FOR ICD-9 250.00, 250.01).  . Continuous Blood Gluc Sensor (FREESTYLE LIBRE 14 DAY SENSOR) MISC SMARTSIG:1 Each Topical Every 2 Weeks  . Dulaglutide 3 MG/0.5ML SOPN Inject into the skin.  Marland Kitchen eszopiclone (LUNESTA) 2 MG TABS tablet TAKE 1 TABLET (2 MG TOTAL) BY MOUTH AT BEDTIME AS NEEDED FOR SLEEP. TAKE IMMEDIATELY BEFORE BEDTIME  . hydrOXYzine (VISTARIL) 50 MG capsule Take 1-2 capsules (50-100 mg total) by mouth at bedtime as needed.  For sleep  . insulin aspart (NOVOLOG FLEXPEN) 100 UNIT/ML FlexPen Inject up to 10 units three times daily as directed by physician  . ketoconazole (NIZORAL) 2 % cream APPLY TO AFFECTED AREA ONCE DAILY FOR 7 DAYS AS NEEDED (Patient taking differently: Apply 1 application topically daily. APPLY TO AFFECTED AREA ONCE DAILY FOR 7 DAYS AS NEEDED)  . lamoTRIgine (LAMICTAL) 150 MG tablet Take 1 tablet (150 mg total) by mouth daily.  Marland Kitchen LEVEMIR FLEXTOUCH 100 UNIT/ML FlexPen Inject 28 Units into the skin daily.  Marland Kitchen lithium carbonate 300 MG capsule Take 1-2 capsules (300-600 mg total)  by mouth as directed. START TAKING 1 CAP DAILY AM AND 2 CAPS DAILY PM  . metFORMIN (GLUCOPHAGE-XR) 500 MG 24 hr tablet TAKE 2 TABLETS BY MOUTH TWICE A DAY  . omeprazole (PRILOSEC) 40 MG capsule TAKE 1 CAPSULE BY MOUTH EVERY DAY  . oxyCODONE-acetaminophen (PERCOCET) 7.5-325 MG tablet Take 1 tablet by mouth every 4 (four) hours as needed.  . prazosin (MINIPRESS) 2 MG capsule Take 1 capsule (2 mg total) by mouth at bedtime.  . predniSONE (DELTASONE) 10 MG tablet TAKE 6 TABLETS FOR 2 DAYS, THEN 5 FOR 2 DAYS, THEN 4 FOR 2 DAYS, THEN 3 FOR 2 DAYS, THEN 2 FOR 2 DAYS, THEN 1 FOR 2 DAYS.  Marland Kitchen promethazine (PHENERGAN) 25 MG tablet TAKE 1 TABLET (25 MG TOTAL) BY MOUTH EVERY 6 (SIX) HOURS AS NEEDED FOR NAUSEA OR VOMITING.  . rosuvastatin (CRESTOR) 20 MG tablet TAKE 1 TABLET BY MOUTH EVERY DAY (Patient taking differently: Take 20 mg by mouth daily.)  . sildenafil (VIAGRA) 50 MG tablet Take 1-2 tablets (50-100 mg total) by mouth daily as needed.  . tamsulosin (FLOMAX) 0.4 MG CAPS capsule Take 0.4 mg by mouth daily.  . TRULICITY 1.5 VO/1.6WV SOPN SMARTSIG:0.5 Milliliter(s) SUB-Q Once a Week  . VIIBRYD 40 MG TABS TAKE 1 TABLET BY MOUTH EVERY DAY  . [DISCONTINUED] metFORMIN (GLUCOPHAGE) 500 MG tablet Take 1 tablet (500 mg total) by mouth 2 (two) times daily with a meal. (Patient not taking: Reported on 10/03/2020)   No facility-administered medications prior to visit.    Review of Systems  Neurological: Positive for numbness.       Objective    BP 123/78 (BP Location: Right Arm, Patient Position: Sitting, Cuff Size: Large)   Pulse 85   Ht 6' (1.829 m)   Wt (!) 340 lb (154.2 kg)   SpO2 98%   BMI 46.11 kg/m     Physical Exam   General appearance: Severely obese male, cooperative and in no acute distress Head: Normocephalic, without obvious abnormality, atraumatic Respiratory: Respirations even and unlabored, normal respiratory rate Extremities: All extremities are intact.  Skin: Skin color, texture,  turgor normal. No rashes seen  Psych: Appropriate mood and affect. Neurologic: Mental status: Alert, oriented to person, place, and time, thought content appropriate.   Results for orders placed or performed in visit on 10/03/20  POCT HgB A1C  Result Value Ref Range   Hemoglobin A1C 9.5 (A) 4.0 - 5.6 %   Estimated Average Glucose 226     Assessment & Plan     1. Type 2 diabetes mellitus with ketoacidosis without coma, without long-term current use of insulin (HCC) Uncontrolled. Printed instructions for levemir titration with goal fasting glucose under 120. Follow up endocrinology in may as scheduled.   2. Chronic pain syndrome Stable on current oxycodone/apap regiment.   3. Chronic low back pain, unspecified back pain laterality, unspecified whether  sciatica present  - Ambulatory referral to Physical Therapy  4. Right foot pain Not c/w diabetic neuropathy.  - Ambulatory referral to Podiatry  5. Hyperlipidemia, mixed He is tolerating rosuvastatin well with no adverse effects.   - Lipid panel - TSH   No follow-ups on file.         [name], MD  [facility] [phone] (phone) [phone] (fax)  [place]

## 2020-10-04 LAB — LIPID PANEL
Chol/HDL Ratio: 3.8 ratio (ref 0.0–5.0)
Cholesterol, Total: 140 mg/dL (ref 100–199)
HDL: 37 mg/dL — ABNORMAL LOW (ref >39)
LDL Chol Calc (NIH): 62 mg/dL (ref 0–99)
Triglycerides: 259 mg/dL — ABNORMAL HIGH (ref 0–149)
VLDL Cholesterol Cal: 41 mg/dL — ABNORMAL HIGH (ref 5–40)

## 2020-10-04 LAB — TSH: TSH: 2.16 u[IU]/mL (ref 0.450–4.500)

## 2020-10-05 ENCOUNTER — Ambulatory Visit: Payer: 59 | Admitting: Physical Therapy

## 2020-10-10 ENCOUNTER — Ambulatory Visit: Payer: 59 | Admitting: Physical Therapy

## 2020-10-12 ENCOUNTER — Ambulatory Visit (INDEPENDENT_AMBULATORY_CARE_PROVIDER_SITE_OTHER): Payer: 59 | Admitting: Licensed Clinical Social Worker

## 2020-10-12 ENCOUNTER — Other Ambulatory Visit: Payer: Self-pay

## 2020-10-12 ENCOUNTER — Encounter: Payer: 59 | Admitting: Physical Therapy

## 2020-10-12 DIAGNOSIS — F331 Major depressive disorder, recurrent, moderate: Secondary | ICD-10-CM | POA: Diagnosis not present

## 2020-10-12 DIAGNOSIS — F603 Borderline personality disorder: Secondary | ICD-10-CM

## 2020-10-12 NOTE — Progress Notes (Signed)
Virtual Visit via Video Note  I connected with Rontrell Moquin Wardell on 10/12/20 at  8:00 AM EDT by a video enabled telemedicine application and verified that I am speaking with the correct person using two identifiers.  Location: Patient: home Provider: [facility]   I discussed the limitations of evaluation and management by telemedicine and the availability of in person appointments. The patient expressed understanding and agreed to proceed.   I discussed the assessment and treatment plan with the patient. The patient was provided an opportunity to ask questions and all were answered. The patient agreed with the plan and demonstrated an understanding of the instructions.   The patient was advised to call back or seek an in-person evaluation if the symptoms worsen or if the condition fails to improve as anticipated.  I provided 45 minutes of non-face-to-face time during this encounter.   Margaretta Chittum R Mills [place]on, LCSW    THERAPIST PROGRESS NOTE  Session Time: 8-8:45a  Participation Level: Active  Behavioral Response: Neat and Well GroomedAlertDepressed  Type of Therapy: Individual Therapy  Treatment Goals addressed: Coping  Interventions: CBT  Summary: Ras Kollman Nordmeyer is a 45 y.o. male who presents with improving symptoms related to depression. Pt feels that mood has been more stable recently. Pt started a new job with [employer] and is excited about the flexibility of it--if he's feeling good he can work more and if not he can work less.   Explored two recent jobs:  [employer] and [employer]. Pt reports that he only worked for short periods of time at both jobs. "my vomiting symptoms came back before I started at [employer]". Explored cyclical vomiting episodes and tried to identify triggers/correlations. Pt does not feel that it is correlated with work or anxiety--feels it is more GI based. "I throw up on myself sometimes"  Allowed pt to explore and express thoughts and feelings about  recent memorial service that pt had for mother on Easter. Pt feels that it is giving him closure. Pt states that he has felt bad that he was not able to do a proper service for her due to covid restrictions. Explored any triggered thoughts and feelings.  Discussed pts pets: two cats and one dog. Pt presented with positive affect when discussing his pets.   Continued recommendations are as follows: self care behaviors, positive social engagements, focusing on overall work/home/life balance, and focusing on positive physical and emotional wellness.    Suicidal/Homicidal: No  Therapist Response: [place] is continuing to develop the ability to recognize, accept, and cope with feelings of depression. [place] is trying hard to renew typical interestin academic achievement, social involvement, and typical eating patterns as well as   Plan: Return again in 3 weeks.  Diagnosis: Axis I: Major Depression, Recurrent severe    Axis II: Cluster B Traits    Anthonella Klausner R Lisia [place], LCSW 10/12/2020

## 2020-10-13 ENCOUNTER — Other Ambulatory Visit: Payer: Self-pay | Admitting: Psychiatry

## 2020-10-13 ENCOUNTER — Other Ambulatory Visit: Payer: Self-pay | Admitting: Family Medicine

## 2020-10-13 DIAGNOSIS — F5105 Insomnia due to other mental disorder: Secondary | ICD-10-CM

## 2020-10-16 MED ORDER — OXYCODONE-ACETAMINOPHEN 7.5-325 MG PO TABS
1.0000 | ORAL_TABLET | ORAL | 0 refills | Status: DC | PRN
Start: 2020-10-16 — End: 2020-10-26

## 2020-10-17 ENCOUNTER — Encounter: Payer: 59 | Admitting: Physical Therapy

## 2020-10-17 ENCOUNTER — Ambulatory Visit: Payer: Self-pay | Admitting: Podiatry

## 2020-10-18 ENCOUNTER — Telehealth (INDEPENDENT_AMBULATORY_CARE_PROVIDER_SITE_OTHER): Payer: 59 | Admitting: Psychiatry

## 2020-10-18 ENCOUNTER — Encounter: Payer: Self-pay | Admitting: Psychiatry

## 2020-10-18 ENCOUNTER — Other Ambulatory Visit: Payer: Self-pay

## 2020-10-18 DIAGNOSIS — F5105 Insomnia due to other mental disorder: Secondary | ICD-10-CM | POA: Diagnosis not present

## 2020-10-18 DIAGNOSIS — F3342 Major depressive disorder, recurrent, in full remission: Secondary | ICD-10-CM

## 2020-10-18 DIAGNOSIS — F411 Generalized anxiety disorder: Secondary | ICD-10-CM | POA: Diagnosis not present

## 2020-10-18 DIAGNOSIS — F4010 Social phobia, unspecified: Secondary | ICD-10-CM | POA: Diagnosis not present

## 2020-10-18 MED ORDER — LAMOTRIGINE 150 MG PO TABS
150.0000 mg | ORAL_TABLET | Freq: Every day | ORAL | 0 refills | Status: DC
Start: 2020-10-18 — End: 2020-11-16

## 2020-10-18 MED ORDER — ESZOPICLONE 2 MG PO TABS: 2.0000 mg | ORAL_TABLET | Freq: Every evening | ORAL | 2 refills | Status: DC | PRN

## 2020-10-18 MED ORDER — VIIBRYD 40 MG PO TABS: 40.0000 mg | ORAL_TABLET | Freq: Every day | ORAL | 0 refills | Status: DC

## 2020-10-18 MED ORDER — LITHIUM CARBONATE 300 MG PO CAPS: 300.0000 mg | ORAL_CAPSULE | ORAL | 1 refills | Status: DC

## 2020-10-18 NOTE — Progress Notes (Signed)
Virtual Visit via Video Note  I connected with Raymond Rose on 10/18/20 at  8:30 AM EDT by a video enabled telemedicine application and verified that I am speaking with the correct person using two identifiers. Location Provider Location : [facility] Patient Location : Home  Participants: Patient , Provider    I discussed the limitations of evaluation and management by telemedicine and the availability of in person appointments. The patient expressed understanding and agreed to proceed.    I discussed the assessment and treatment plan with the patient. The patient was provided an opportunity to ask questions and all were answered. The patient agreed with the plan and demonstrated an understanding of the instructions.   The patient was advised to call back or seek an in-person evaluation if the symptoms worsen or if the condition fails to improve as anticipated.    [place] MD OP Progress Note  10/18/2020 12:55 PM Raymond Rose  MRN:  [id number]  Chief Complaint:  Chief Complaint    Follow-up; Depression; Anxiety     HPI: Raymond Rose is a 45 year old Caucasian male, married, lives in [place], has a history of MDD, GAD, social anxiety disorder, sleep apnea, cyclical vomiting, diabetes mellitus, erectile dysfunction was evaluated by telemedicine today.  Patient today reports he quit his job at [employer].  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  He is currently going through a flareup of cyclical vomiting.  He reports due to the flexibility of his work he is managing okay.  Patient reports he actually feels better with regards to his mood.  He denies any significant sadness or anxiety symptoms.  He reports sleep is overall okay.  He is compliant on the medications as prescribed.  Patient reports he continues to follow-up with his providers for his cyclical vomiting and he is currently getting symptomatic treatment.  He continues to be in therapy  and reports therapy sessions are beneficial.  He denies any suicidality at this time.  He denies any self-injurious behaviors since his last visit with Probation officer.  Patient denies any other concerns today.  Visit Diagnosis:    ICD-10-CM   1. MDD (major depressive disorder), recurrent, in full remission ([place])  F33.42 lithium carbonate 300 MG capsule  2. GAD (generalized anxiety disorder)  F41.1 Vilazodone HCl (VIIBRYD) 40 MG TABS    lamoTRIgine (LAMICTAL) 150 MG tablet  3. Social anxiety disorder  F40.10   4. Insomnia due to mental disorder  F51.05 eszopiclone (LUNESTA) 2 MG TABS tablet   anxiety, depression    Past Psychiatric History: I have reviewed past psychiatric history from progress note on 04/21/2019.  Past trials of Lexapro, Wellbutrin, Elavil, Effexor, Zyprexa, Seroquel  Past Medical History:  Past Medical History:  Diagnosis Date  . Anxiety   . Arthritis   . COVID-19 06/2019  . Cyclical vomiting   . Depression   . OSA on CPAP   . Vertigo 01/03/2015    Past Surgical History:  Procedure Laterality Date  . 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 CHOLECYSTECTOMY  2010   lap Cholecystectomy  . 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 CT scan of Brain  12/23/2011   without contrast, [facility], Normal  . TONSILLECTOMY  1985    Family Psychiatric History: I have reviewed family psychiatric history from progress note on 04/21/2019  Family History:  Family History  Problem Relation Age of Onset  . Depression Mother   . Heart attack Father 35  . Arthritis Other   . Alcohol abuse Other   . Lung cancer Other   . Hyperlipidemia Other   . CAD Other   . Stroke Other   . Hypertension Other   . Bipolar disorder Other     Social History: Reviewed social history from progress note on 04/21/2019 Social History   Socioeconomic History  . Marital status: Married    Spouse name: Not on file  .  Number of children: Not on file  . Years of education: Not on file  . Highest education level: Not on file  Occupational History  . Occupation: Banking    Comment: Engineer, manufacturing  Tobacco Use  . Smoking status: Former Smoker    Years: 5.00    Quit date: 06/17/1997    Years since quitting: 23.3  . Smokeless tobacco: Never Used  . Tobacco comment: started smoking at ahe 16, and quit at age 72  Vaping Use  . Vaping Use: Never used  Substance and Sexual Activity  . Alcohol use: Yes    Alcohol/week: 0.0 standard drinks    Comment: occasional alcohol use  . Drug use: No  . Sexual activity: Not on file  Other Topics Concern  . Not on file  Social History Narrative  . Not on file   Social Determinants of Health   Financial Resource Strain: Not on file  Food Insecurity: Not on file  Transportation Needs: Not on file  Physical Activity: Not on file  Stress: Not on file  Social Connections: Not on file    Allergies:  Allergies  Allergen Reactions  . Bupropion     Extreme anxiety  . Morphine     Metabolic Disorder Labs: Lab Results  Component Value Date   HGBA1C 9.5 (A) 10/03/2020   MPG 243.17 01/23/2020   No results found for: PROLACTIN Lab Results  Component Value Date   CHOL 140 10/03/2020   TRIG 259 (H) 10/03/2020   HDL 37 (L) 10/03/2020   CHOLHDL 3.8 10/03/2020   LDLCALC 62 10/03/2020   LDLCALC 42 05/24/2019   Lab Results  Component Value Date   TSH 2.160 10/03/2020   TSH 2.000 07/31/2020    Therapeutic Level Labs: Lab Results  Component Value Date   LITHIUM 0.37 (L) 09/08/2020   LITHIUM 0.29 (L) 08/31/2020   No results found for: VALPROATE No components found for:  CBMZ  Current Medications: Current Outpatient Medications  Medication Sig Dispense Refill  . ALPRAZolam (XANAX) 0.5 MG tablet TAKE 1 TABLET (0.5 MG TOTAL) BY MOUTH EVERY 4 (FOUR) HOURS AS NEEDED. FOR ANXIETY 30 tablet 2  . BD PEN NEEDLE NANO 2ND GEN 32G X 4 MM MISC USE AS DIRECTED 4 TIMES  A DAY 100 each 3  . blood glucose meter kit and supplies KIT Dispense based on patient and insurance preference. Use up to four times daily as directed. (FOR ICD-9 250.00, 250.01). 1 each 0  . Continuous Blood Gluc Sensor (FREESTYLE LIBRE 14 DAY SENSOR) MISC SMARTSIG:1 Each Topical Every 2 Weeks    . Dulaglutide 3 MG/0.5ML SOPN Inject into the skin.    Marland Kitchen eszopiclone (LUNESTA) 2 MG TABS tablet Take 1 tablet (2 mg total) by mouth at bedtime as needed for sleep. Take immediately before bedtime 30 tablet 2  . fluconazole (DIFLUCAN) 150 MG tablet Take 150 mg by mouth once.    Marland Kitchen  hydrOXYzine (VISTARIL) 50 MG capsule Take 1-2 capsules (50-100 mg total) by mouth at bedtime as needed. For sleep 60 capsule 1  . insulin aspart (NOVOLOG FLEXPEN) 100 UNIT/ML FlexPen Inject up to 10 units three times daily as directed by physician 15 mL 5  . ketoconazole (NIZORAL) 2 % cream APPLY TO AFFECTED AREA ONCE DAILY FOR 7 DAYS AS NEEDED (Patient taking differently: Apply 1 application topically daily. APPLY TO AFFECTED AREA ONCE DAILY FOR 7 DAYS AS NEEDED) 15 g 3  . lamoTRIgine (LAMICTAL) 150 MG tablet Take 1 tablet (150 mg total) by mouth daily. 90 tablet 0  . LEVEMIR FLEXTOUCH 100 UNIT/ML FlexPen Inject 28 Units into the skin daily. 15 mL 5  . lithium carbonate 300 MG capsule Take 1-2 capsules (300-600 mg total) by mouth as directed. START TAKING 1 CAP DAILY AM AND 2 CAPS DAILY PM 90 capsule 1  . metFORMIN (GLUCOPHAGE-XR) 500 MG 24 hr tablet TAKE 2 TABLETS BY MOUTH TWICE A DAY 120 tablet 3  . omeprazole (PRILOSEC) 40 MG capsule TAKE 1 CAPSULE BY MOUTH EVERY DAY 90 capsule 0  . oxyCODONE-acetaminophen (PERCOCET) 7.5-325 MG tablet Take 1 tablet by mouth every 4 (four) hours as needed. 60 tablet 0  . prazosin (MINIPRESS) 2 MG capsule TAKE 1 CAPSULE BY MOUTH AT BEDTIME. 30 capsule 1  . promethazine (PHENERGAN) 25 MG tablet TAKE 1 TABLET (25 MG TOTAL) BY MOUTH EVERY 6 (SIX) HOURS AS NEEDED FOR NAUSEA OR VOMITING. 20 tablet 1   . rosuvastatin (CRESTOR) 20 MG tablet TAKE 1 TABLET BY MOUTH EVERY DAY (Patient taking differently: Take 20 mg by mouth daily.) 30 tablet 12  . sildenafil (VIAGRA) 50 MG tablet Take 1-2 tablets (50-100 mg total) by mouth daily as needed. 90 tablet 3  . tamsulosin (FLOMAX) 0.4 MG CAPS capsule Take 0.4 mg by mouth daily.    . TRULICITY 1.5 ZG/0.1VC SOPN SMARTSIG:0.5 Milliliter(s) SUB-Q Once a Week    . Vilazodone HCl (VIIBRYD) 40 MG TABS Take 1 tablet (40 mg total) by mouth daily. 90 tablet 0   No current facility-administered medications for this visit.     Musculoskeletal: Strength & Muscle Tone: UTA Gait & Station: UTA Patient leans: N/A  Psychiatric Specialty Exam: Review of Systems  Gastrointestinal: Positive for vomiting.  Psychiatric/Behavioral: The patient is nervous/anxious.   All other systems reviewed and are negative.   There were no vitals taken for this visit.There is no height or weight on file to calculate BMI.  General Appearance: Casual  Eye Contact:  Fair  Speech:  Clear and Coherent  Volume:  Normal  Mood:  Anxious  Affect:  Appropriate  Thought Process:  Goal Directed and Descriptions of Associations: Intact  Orientation:  Full (Time, Place, and Person)  Thought Content: Logical   Suicidal Thoughts:  No  Homicidal Thoughts:  No  Memory:  Immediate;   Fair Recent;   Fair Remote;   Fair  Judgement:  Fair  Insight:  Fair  Psychomotor Activity:  Normal  Concentration:  Concentration: Fair and Attention Span: Fair  Recall:  [employer] of Knowledge: Fair  Language: Fair  Akathisia:  No  Handed:  Right  AIMS (if indicated): UTA  Assets:  Communication Skills Desire for Improvement Housing Intimacy Social Support Talents/Skills Transportation Vocational/Educational  ADL's:  Intact  Cognition: WNL  Sleep:  Fair   Screenings: GAD-7   Flowsheet Row Video Visit from 10/18/2020 in [place] Counselor from [facility]10/2020 in  [facility] Visit from 04/16/2019 in [facility]  Total GAD-7 Score _0 PHQ2-9   Flowsheet Row Video Visit from 10/18/2020 in [place] Office Visit from 10/03/2020 in [place] from 08/31/2020 in [place] Video Visit from 08/16/2020 in [place] Video Visit from 08/02/2020 in [place]  PHQ-2 Total Score 0 _1 PHQ-9 Total Score _2 Flowsheet Row Counselor from 08/31/2020 in [place] Counselor from 08/09/2020 in [place] Video Visit from 08/02/2020 in [place] Low Risk Low Risk Low Risk       Assessment and Plan: Chao Blazejewski Primmer is a 45 year old Caucasian male, employed, [place], has a history of MDD, OSA on CPAP, diabetes mellitus, arthritis was evaluated by telemedicine today.  Patient is biologically predisposed given history of trauma, family history of mental health problems.  Patient is currently stable.  Plan MDD in remission Viibryd 40 mg p.o. daily Lithium 300 mg p.o. twice daily Lithium level reviewed and discussed dated 09/08/2020- 0.37-subtherapeutic Lamictal 150 mg p.o. daily  GAD- improving Continue CBT with Ms. Christina Hussami Hydroxyzine 50-100 mg p.o. nightly as needed Continue Xanax as needed prescribed per primary care provider.  Patient reports his last dose was few weeks ago.  He is limiting use.  Social anxiety disorder-improving Continue CBT  Insomnia-stable Prazosin 2 mg p.o. nightly for nightmares Lunesta 2 mg p.o. nightly Continue CBT  Provided information for transcranial magnetic stimulation.  Follow-up in clinic in 1 month or sooner if needed.  This note was generated in part or whole with voice recognition  software. Voice recognition is usually quite accurate but there are transcription errors that can and very often do occur. I apologize for any typographical errors that were not detected and corrected.        Ursula Alert, MD 10/18/2020, 12:55 PM

## 2020-10-19 ENCOUNTER — Encounter: Payer: 59 | Admitting: Physical Therapy

## 2020-10-19 ENCOUNTER — Ambulatory Visit: Payer: Self-pay | Admitting: Podiatry

## 2020-10-24 ENCOUNTER — Encounter: Payer: 59 | Admitting: Physical Therapy

## 2020-10-26 ENCOUNTER — Other Ambulatory Visit: Payer: Self-pay | Admitting: Family Medicine

## 2020-10-27 ENCOUNTER — Other Ambulatory Visit: Payer: Self-pay | Admitting: Family Medicine

## 2020-10-27 MED ORDER — OXYCODONE-ACETAMINOPHEN 7.5-325 MG PO TABS: 1.0000 | ORAL_TABLET | ORAL | 0 refills | Status: DC | PRN

## 2020-10-27 NOTE — Telephone Encounter (Signed)
Requested medication (s) are due for refill today: yes  Requested medication (s) are on the active medication list:  yes  Last refill:  09/27/2020  Future visit scheduled: no  Notes to clinic:  this refill cannot be delegated    Requested Prescriptions  Pending Prescriptions Disp Refills   predniSONE (DELTASONE) 10 MG tablet [Pharmacy Med Name: PREDNISONE 10 MG TABLET] 42 tablet 0    Sig: TAKE 6 TABLETS FOR 2 DAYS, THEN 5 FOR 2 DAYS, THEN 4 FOR 2 DAYS, THEN 3 FOR 2 DAYS, THEN 2 FOR 2 DAYS, THEN 1 FOR 2 DAYS.      Not Delegated - Endocrinology:  Oral Corticosteroids Failed - 10/27/2020  8:21 AM      Failed - This refill cannot be delegated      Passed - Last BP in normal range    BP Readings from Last 1 Encounters:  10/03/20 123/78          Passed - Valid encounter within last 6 months    Recent Outpatient Visits           3 weeks ago Type 2 diabetes mellitus with ketoacidosis without coma, without long-term current use of insulin [facility])   [facility] Birdie Sons, MD   3 months ago Cough   [facility] Carles Collet M, [place]   6 months ago Chronic hip pain, bilateral   [facility] Birdie Sons, MD   8 months ago Type 2 diabetes mellitus with ketoacidosis without coma, without long-term current use of insulin [facility])   [facility] Birdie Sons, MD   9 months ago Weakness   [facility] [place], [place], [place]

## 2020-10-31 ENCOUNTER — Encounter: Payer: 59 | Admitting: Physical Therapy

## 2020-11-02 ENCOUNTER — Ambulatory Visit: Payer: Self-pay | Admitting: Podiatry

## 2020-11-02 ENCOUNTER — Encounter: Payer: 59 | Admitting: Physical Therapy

## 2020-11-06 ENCOUNTER — Other Ambulatory Visit: Payer: Self-pay

## 2020-11-06 ENCOUNTER — Ambulatory Visit (INDEPENDENT_AMBULATORY_CARE_PROVIDER_SITE_OTHER): Payer: 59 | Admitting: Licensed Clinical Social Worker

## 2020-11-06 ENCOUNTER — Other Ambulatory Visit: Payer: Self-pay | Admitting: Family Medicine

## 2020-11-06 DIAGNOSIS — F3342 Major depressive disorder, recurrent, in full remission: Secondary | ICD-10-CM

## 2020-11-06 DIAGNOSIS — F411 Generalized anxiety disorder: Secondary | ICD-10-CM | POA: Diagnosis not present

## 2020-11-06 NOTE — Progress Notes (Signed)
Virtual Visit via Video Note  I connected with Raymond Rose on 11/06/20 at 10:00 AM EDT by a video enabled telemedicine application and verified that I am speaking with the correct person using two identifiers.  Location: Patient: home Provider: remote office [place], [place])   I discussed the limitations of evaluation and management by telemedicine and the availability of in person appointments. The patient expressed understanding and agreed to proceed.  I discussed the assessment and treatment plan with the patient. The patient was provided an opportunity to ask questions and all were answered. The patient agreed with the plan and demonstrated an understanding of the instructions.   The patient was advised to call back or seek an in-person evaluation if the symptoms worsen or if the condition fails to improve as anticipated.  I provided 45 minutes of non-face-to-face time during this encounter.  Raymond Golden R Shylin Keizer, LCSW   THERAPIST PROGRESS NOTE  Session Time:10-10:45a  Participation Level: Active  Behavioral Response: Neat and Well GroomedAlertDepressed  Type of Therapy: Individual Therapy  Treatment Goals addressed: Coping  Interventions: CBT and DBT  Summary: Raymond Rose is a 45 y.o. male who presents with improving symptoms related to depression and anxiety. Raymond Rose reports that overall mood is stable and that he is managing stressors well.  Allowed pt to explore and express thoughts and feelings surrounding recent life events and stressors. Discussed recent golf outing where pt got sick afterwards. "I don't know what happened". Pt reports that he was well hydrated and had eaten that day.   Discussed pts overall physical health and pt wants to make some changes that will be beneficial to overall DM self management. Pt reports that he feels his blood sugar is around 300 most of the time and doesn't like to check it for that reason.   Discussed current job  with Raymond Rose and pt continuing to seek full time work. Pt reports that he often will amplify the negative (3 bad reviews) and minimize the positive (85 positive reviews).  Discussed thought distortions and pt is aware of what he is doing and that the data supports that his services are more positive than negative. "I just want closure from the ones that gave me a negative review".   Discussed acceptance without possibility of discussion/closure.   Continued recommendations are as follows: self care behaviors, positive social engagements, focusing on overall work/home/life balance, and focusing on positive physical and emotional wellness.   Suicidal/Homicidal: No  Therapist Response: Raymond Rose is continuing to utilize behavioral strategies to overcome depression. Raymond Rose is aware of how important it is for him to implement a regular exercise regimen as a depression reduction technique. Raymond Rose is able to engage in recreational activities that reflect increased energy and interest. Raymond Rose has an improved level of self awareness and is setting positive goals for the future.   Plan: Return again in 4 weeks.  Diagnosis: Axis I: MDD, recurrent, in remission; GAD    Axis II: Cluster B Traits    Raymond Straus R Darinda [place], LCSW 11/06/2020

## 2020-11-07 ENCOUNTER — Encounter: Payer: 59 | Admitting: Physical Therapy

## 2020-11-07 MED ORDER — OXYCODONE-ACETAMINOPHEN 7.5-325 MG PO TABS
1.0000 | ORAL_TABLET | ORAL | 0 refills | Status: DC | PRN
Start: 2020-11-07 — End: 2020-11-17

## 2020-11-09 ENCOUNTER — Encounter: Payer: 59 | Admitting: Physical Therapy

## 2020-11-13 ENCOUNTER — Other Ambulatory Visit: Payer: Self-pay | Admitting: Psychiatry

## 2020-11-13 DIAGNOSIS — F331 Major depressive disorder, recurrent, moderate: Secondary | ICD-10-CM

## 2020-11-16 ENCOUNTER — Telehealth (INDEPENDENT_AMBULATORY_CARE_PROVIDER_SITE_OTHER): Payer: 59 | Admitting: Psychiatry

## 2020-11-16 ENCOUNTER — Other Ambulatory Visit: Payer: Self-pay

## 2020-11-16 ENCOUNTER — Encounter: Payer: Self-pay | Admitting: Psychiatry

## 2020-11-16 ENCOUNTER — Telehealth: Payer: Self-pay

## 2020-11-16 DIAGNOSIS — F411 Generalized anxiety disorder: Secondary | ICD-10-CM

## 2020-11-16 DIAGNOSIS — F4010 Social phobia, unspecified: Secondary | ICD-10-CM

## 2020-11-16 DIAGNOSIS — F330 Major depressive disorder, recurrent, mild: Secondary | ICD-10-CM

## 2020-11-16 DIAGNOSIS — F5105 Insomnia due to other mental disorder: Secondary | ICD-10-CM | POA: Diagnosis not present

## 2020-11-16 DIAGNOSIS — Z79899 Other long term (current) drug therapy: Secondary | ICD-10-CM

## 2020-11-16 MED ORDER — LAMOTRIGINE 100 MG PO TABS: 100.0000 mg | ORAL_TABLET | Freq: Every day | ORAL | 0 refills | Status: DC

## 2020-11-16 MED ORDER — OLANZAPINE 2.5 MG PO TABS: 2.5000 mg | ORAL_TABLET | Freq: Every day | ORAL | 1 refills | Status: DC

## 2020-11-16 NOTE — Progress Notes (Signed)
Virtual Visit via Video Note  I connected with Raymond Rose on 11/16/20 at  1:00 PM EDT by a video enabled telemedicine application and verified that I am speaking with the correct person using two identifiers.  Location Provider Location : [facility] Patient Location : Home  Participants: Patient , Provider    I discussed the limitations of evaluation and management by telemedicine and the availability of in person appointments. The patient expressed understanding and agreed to proceed.    I discussed the assessment and treatment plan with the patient. The patient was provided an opportunity to ask questions and all were answered. The patient agreed with the plan and demonstrated an understanding of the instructions.   The patient was advised to call back or seek an in-person evaluation if the symptoms worsen or if the condition fails to improve as anticipated.   [place] MD OP Progress Note  11/16/2020 4:46 PM Hansen Carino Marlowe  MRN:  [id number]  Chief Complaint:  Chief Complaint    Follow-up; Anxiety; Depression     HPI: Raymond Rose is a 45 year old Caucasian male, married, lives in [place], has a history of MDD, GAD, social anxiety disorder, sleep apnea, cyclical vomiting, diabetes mellitus, erectile dysfunction was evaluated by telemedicine today.  Patient today reports he was feeling good the past few months however the past 3 days has been stressful.  Patient reports he continues to struggle with GI symptoms, his cyclical vomiting returned and it has been worse since the past few weeks.  This could be making his mood symptoms worse.  Patient reports feeling sad, down the past few days.  Patient reports sleep is overall okay most nights.  Patient reports he continues to worry about his health in general.  He is planning to go back to his primary care provider for management of his cyclical vomiting.  The current antinausea medications does not help much.  Patient also  wonders whether his medications are being absorbed since he is vomiting a lot which could be having an impact on his mood.  Patient denies any suicidality, homicidality or perceptual disturbances.  Patient reports continued support from his wife.  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  Patient denies any other concerns today.    Visit Diagnosis:    ICD-10-CM   1. MDD (major depressive disorder), recurrent episode, mild (HCC)  F33.0 lamoTRIgine (LAMICTAL) 100 MG tablet    OLANZapine (ZYPREXA) 2.5 MG tablet  2. GAD (generalized anxiety disorder)  F41.1   3. Social anxiety disorder  F40.10   4. Insomnia due to mental disorder  F51.05    mood  5. High risk medication use  Z79.899 Lithium level    BUN+Creat    Past Psychiatric History: I have reviewed past psychiatric history from progress note on 04/21/2019.  Past trials of Lexapro, Wellbutrin, Elavil, Effexor, Zyprexa, Seroquel  Past Medical History:  Past Medical History:  Diagnosis Date  . Anxiety   . Arthritis   . COVID-19 06/2019  . Cyclical vomiting   . Depression   . OSA on CPAP   . Vertigo 01/03/2015    Past Surgical History:  Procedure Laterality Date  . 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 CHOLECYSTECTOMY  2010   lap Cholecystectomy  . 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 CT scan of Brain  12/23/2011  without contrast, [facility], Normal  . TONSILLECTOMY  1985    Family Psychiatric History: I have reviewed family psychiatric history from progress note on 04/21/2019  Family History:  Family History  Problem Relation Age of Onset  . Depression Mother   . Heart attack Father 75  . Arthritis Other   . Alcohol abuse Other   . Lung cancer Other   . Hyperlipidemia Other   . CAD Other   . Stroke Other   . Hypertension Other   . Bipolar disorder Other     Social History: Reviewed social history  from progress note on 04/21/2019 Social History   Socioeconomic History  . Marital status: Married    Spouse name: Not on file  . Number of children: Not on file  . Years of education: Not on file  . Highest education level: Not on file  Occupational History  . Occupation: Banking    Comment: Engineer, manufacturing  Tobacco Use  . Smoking status: Former Smoker    Years: 5.00    Quit date: 06/17/1997    Years since quitting: 23.4  . Smokeless tobacco: Never Used  . Tobacco comment: started smoking at ahe 16, and quit at age 85  Vaping Use  . Vaping Use: Never used  Substance and Sexual Activity  . Alcohol use: Yes    Alcohol/week: 0.0 standard drinks    Comment: occasional alcohol use  . Drug use: No  . Sexual activity: Not on file  Other Topics Concern  . Not on file  Social History Narrative  . Not on file   Social Determinants of Health   Financial Resource Strain: Not on file  Food Insecurity: Not on file  Transportation Needs: Not on file  Physical Activity: Not on file  Stress: Not on file  Social Connections: Not on file    Allergies:  Allergies  Allergen Reactions  . Bupropion     Extreme anxiety  . Morphine     Metabolic Disorder Labs: Lab Results  Component Value Date   HGBA1C 9.5 (A) 10/03/2020   MPG 243.17 01/23/2020   No results found for: PROLACTIN Lab Results  Component Value Date   CHOL 140 10/03/2020   TRIG 259 (H) 10/03/2020   HDL 37 (L) 10/03/2020   CHOLHDL 3.8 10/03/2020   LDLCALC 62 10/03/2020   LDLCALC 42 05/24/2019   Lab Results  Component Value Date   TSH 2.160 10/03/2020   TSH 2.000 07/31/2020    Therapeutic Level Labs: Lab Results  Component Value Date   LITHIUM 0.37 (L) 09/08/2020   LITHIUM 0.29 (L) 08/31/2020   No results found for: VALPROATE No components found for:  CBMZ  Current Medications: Current Outpatient Medications  Medication Sig Dispense Refill  . lamoTRIgine (LAMICTAL) 100 MG tablet Take 1 tablet (100 mg  total) by mouth daily. 90 tablet 0  . OLANZapine (ZYPREXA) 2.5 MG tablet Take 1 tablet (2.5 mg total) by mouth at bedtime. 30 tablet 1  . oxyCODONE-acetaminophen (PERCOCET) 7.5-325 MG tablet Take 1 tablet by mouth every 4 (four) hours as needed. 60 tablet 0  . ALPRAZolam (XANAX) 0.5 MG tablet TAKE 1 TABLET (0.5 MG TOTAL) BY MOUTH EVERY 4 (FOUR) HOURS AS NEEDED. FOR ANXIETY 30 tablet 2  . BD PEN NEEDLE NANO 2ND GEN 32G X 4 MM MISC USE AS DIRECTED 4 TIMES A DAY 100 each 3  . blood glucose meter kit and supplies KIT Dispense based on patient and insurance preference. Use up to  four times daily as directed. (FOR ICD-9 250.00, 250.01). 1 each 0  . Continuous Blood Gluc Sensor (FREESTYLE LIBRE 14 DAY SENSOR) MISC SMARTSIG:1 Each Topical Every 2 Weeks    . Dulaglutide 3 MG/0.5ML SOPN Inject into the skin.    Marland Kitchen eszopiclone (LUNESTA) 2 MG TABS tablet Take 1 tablet (2 mg total) by mouth at bedtime as needed for sleep. Take immediately before bedtime 30 tablet 2  . fluconazole (DIFLUCAN) 150 MG tablet Take 150 mg by mouth once.    . hydrOXYzine (VISTARIL) 50 MG capsule TAKE 1 TO 2 CAPSULES BY MOUTH AT BEDTIME AS NEEDED FOR SLEEP 60 capsule 1  . insulin aspart (NOVOLOG FLEXPEN) 100 UNIT/ML FlexPen Inject up to 10 units three times daily as directed by physician 15 mL 5  . ketoconazole (NIZORAL) 2 % cream APPLY TO AFFECTED AREA ONCE DAILY FOR 7 DAYS AS NEEDED (Patient taking differently: Apply 1 application topically daily. APPLY TO AFFECTED AREA ONCE DAILY FOR 7 DAYS AS NEEDED) 15 g 3  . LEVEMIR FLEXTOUCH 100 UNIT/ML FlexPen Inject 28 Units into the skin daily. 15 mL 5  . lithium carbonate 300 MG capsule Take 1-2 capsules (300-600 mg total) by mouth as directed. START TAKING 1 CAP DAILY AM AND 2 CAPS DAILY PM 90 capsule 1  . metFORMIN (GLUCOPHAGE-XR) 500 MG 24 hr tablet TAKE 2 TABLETS BY MOUTH TWICE A DAY 120 tablet 3  . omeprazole (PRILOSEC) 40 MG capsule TAKE 1 CAPSULE BY MOUTH EVERY DAY 90 capsule 0  .  prazosin (MINIPRESS) 2 MG capsule TAKE 1 CAPSULE BY MOUTH AT BEDTIME. 30 capsule 1  . promethazine (PHENERGAN) 25 MG tablet TAKE 1 TABLET (25 MG TOTAL) BY MOUTH EVERY 6 (SIX) HOURS AS NEEDED FOR NAUSEA OR VOMITING. 20 tablet 1  . rosuvastatin (CRESTOR) 20 MG tablet TAKE 1 TABLET BY MOUTH EVERY DAY (Patient taking differently: Take 20 mg by mouth daily.) 30 tablet 12  . sildenafil (VIAGRA) 50 MG tablet Take 1-2 tablets (50-100 mg total) by mouth daily as needed. 90 tablet 3  . tamsulosin (FLOMAX) 0.4 MG CAPS capsule Take 0.4 mg by mouth daily.    . TRULICITY 1.5 DD/2.2GU SOPN SMARTSIG:0.5 Milliliter(s) SUB-Q Once a Week    . Vilazodone HCl (VIIBRYD) 40 MG TABS Take 1 tablet (40 mg total) by mouth daily. 90 tablet 0   No current facility-administered medications for this visit.     Musculoskeletal: Strength & Muscle Tone: UTA Gait & Station: UTA Patient leans: N/A  Psychiatric Specialty Exam: Review of Systems  Gastrointestinal: Positive for vomiting.  Psychiatric/Behavioral: Positive for dysphoric mood.  All other systems reviewed and are negative.   There were no vitals taken for this visit.There is no height or weight on file to calculate BMI.  General Appearance: Casual  Eye Contact:  Fair  Speech:  Clear and Coherent  Volume:  Normal  Mood:  Depressed  Affect:  Congruent  Thought Process:  Goal Directed and Descriptions of Associations: Intact  Orientation:  Full (Time, Place, and Person)  Thought Content: Logical   Suicidal Thoughts:  No  Homicidal Thoughts:  No  Memory:  Immediate;   Fair Recent;   Fair Remote;   Fair  Judgement:  Fair  Insight:  Fair  Psychomotor Activity:  Normal  Concentration:  Concentration: Fair and Attention Span: Fair  Recall:  [employer] of Knowledge: Fair  Language: Fair  Akathisia:  No  Handed:  Right  AIMS (if indicated): UTA  Assets:  Communication Skills Desire for Improvement Housing Social Support  ADL's:  Intact  Cognition:  WNL  Sleep:  Fair   Screenings: GAD-7   Flowsheet Row Video Visit from 10/18/2020 in [place] from 03/10/2020 in [place] Visit from 04/16/2019 in [place]  Total GAD-7 Score _0 PHQ2-9   Flowsheet Row Video Visit from 11/16/2020 in [place] Video Visit from 10/18/2020 in [place] Visit from 10/03/2020 in [place] from 08/31/2020 in [place] Video Visit from 08/16/2020 in [place]  PHQ-2 Total Score 1 0 _1 PHQ-9 Total Score _2 Flowsheet Row Counselor from 08/31/2020 in [place] Counselor from 08/09/2020 in [place] Video Visit from 08/02/2020 in [place] Low Risk Low Risk Low Risk       Assessment and Plan: Raymond Rose is a 45 year old Caucasian male, employed, lives in [place], has a history of MDD, OSA on CPAP, diabetes melitis, arthritis was evaluated by telemedicine today.  Patient is biologically predisposed given history of trauma, family history of mental health problems.  Patient is currently struggling with cyclical vomiting and that does have an impact on his mood.  Discussed plan as noted below.  Plan MDD-unstable PHQ-9 today equals 9 Viibryd 40 mg p.o. daily Lithium 900 mg p.o. daily. Lithium level- dated 09/08/2020-0.37- subtherapeutic Reduce Lamictal to 100 mg p.o. daily Add Zyprexa 2.5 mg p.o. nightly  GAD- improving GAD-7 equals 6 Patient to continue CBT with Ms. Christina Hussami Continue Xanax as prescribed by primary care provider.  He has been limiting use. Hydroxyzine 50-100 mg p.o. nightly as needed  Social anxiety disorder- improving Continue CBT  Insomnia-  stable Prazosin 2 mg p.o. nightly Lunesta 2 mg p.o. nightly Continue CBT  High risk medication use-we will order lithium level, BUN/creatinine.  Patient to go to [facility] lab tomorrow morning.  Patient was provided information for transcranial magnetic stimulation in the past.  Follow-up in clinic in 3 to 4 weeks or sooner if needed.  This note was generated in part or whole with voice recognition software. Voice recognition is usually quite accurate but there are transcription errors that can and very often do occur. I apologize for any typographical errors that were not detected and corrected.      Ursula Alert, MD 11/17/2020, 11:59 AM

## 2020-11-16 NOTE — Telephone Encounter (Signed)
faxed and confirmed order for lithium level and bun + creat dx:  z79.899

## 2020-11-17 ENCOUNTER — Other Ambulatory Visit: Payer: Self-pay | Admitting: Family Medicine

## 2020-11-20 ENCOUNTER — Emergency Department
Admission: EM | Admit: 2020-11-20 | Discharge: 2020-11-21 | Disposition: A | Discharge status: Home / Self Care | Payer: 59 | Attending: Emergency Medicine | Admitting: Emergency Medicine

## 2020-11-20 ENCOUNTER — Other Ambulatory Visit: Payer: Self-pay

## 2020-11-20 DIAGNOSIS — R112 Nausea with vomiting, unspecified: Secondary | ICD-10-CM

## 2020-11-20 DIAGNOSIS — Z7984 Long term (current) use of oral hypoglycemic drugs: Secondary | ICD-10-CM | POA: Diagnosis not present

## 2020-11-20 DIAGNOSIS — Z794 Long term (current) use of insulin: Secondary | ICD-10-CM | POA: Diagnosis not present

## 2020-11-20 DIAGNOSIS — Z8616 Personal history of COVID-19: Secondary | ICD-10-CM | POA: Insufficient documentation

## 2020-11-20 DIAGNOSIS — R1084 Generalized abdominal pain: Secondary | ICD-10-CM | POA: Insufficient documentation

## 2020-11-20 DIAGNOSIS — Z87891 Personal history of nicotine dependence: Secondary | ICD-10-CM | POA: Diagnosis not present

## 2020-11-20 DIAGNOSIS — E1165 Type 2 diabetes mellitus with hyperglycemia: Secondary | ICD-10-CM | POA: Insufficient documentation

## 2020-11-20 DIAGNOSIS — R739 Hyperglycemia, unspecified: Secondary | ICD-10-CM

## 2020-11-20 LAB — COMPREHENSIVE METABOLIC PANEL
ALT: 26 U/L (ref 0–44)
AST: 21 U/L (ref 15–41)
Albumin: 4.6 g/dL (ref 3.5–5.0)
Alkaline Phosphatase: 76 U/L (ref 38–126)
Anion gap: 16 — ABNORMAL HIGH (ref 5–15)
BUN: 12 mg/dL (ref 6–20)
CO2: 18 mmol/L — ABNORMAL LOW (ref 22–32)
Calcium: 9.4 mg/dL (ref 8.9–10.3)
Chloride: 101 mmol/L (ref 98–111)
Creatinine, Ser: 0.81 mg/dL (ref 0.61–1.24)
GFR, Estimated: >60 mL/min (ref >60)
Glucose, Bld: 345 mg/dL — ABNORMAL HIGH (ref 70–99)
Potassium: 3.8 mmol/L (ref 3.5–5.1)
Sodium: 135 mmol/L (ref 135–145)
Total Bilirubin: 2.5 mg/dL — ABNORMAL HIGH (ref 0.3–1.2)
Total Protein: 8.1 g/dL (ref 6.5–8.1)

## 2020-11-20 LAB — CBC
HCT: 49.8 % (ref 39.0–52.0)
Hemoglobin: 16.8 g/dL (ref 13.0–17.0)
MCH: 28.5 pg (ref 26.0–34.0)
MCHC: 33.7 g/dL (ref 30.0–36.0)
MCV: 84.6 fL (ref 80.0–100.0)
Platelets: 294 10*3/uL (ref 150–400)
RBC: 5.89 MIL/uL — ABNORMAL HIGH (ref 4.22–5.81)
RDW: 13.6 % (ref 11.5–15.5)
WBC: 11.9 10*3/uL — ABNORMAL HIGH (ref 4.0–10.5)
nRBC: 0 % (ref 0.0–0.2)

## 2020-11-20 LAB — LIPASE, BLOOD: Lipase: 27 U/L (ref 11–51)

## 2020-11-20 LAB — CBG MONITORING, ED: Glucose-Capillary: 356 mg/dL — ABNORMAL HIGH (ref 70–99)

## 2020-11-20 MED ORDER — INSULIN ASPART 100 UNIT/ML IJ SOLN
8.0000 [IU] | Freq: Once | INTRAMUSCULAR | Status: AC
  Administered 2020-11-20: 8 [IU] via INTRAVENOUS
  Filled 2020-11-20: qty 1

## 2020-11-20 MED ORDER — SODIUM CHLORIDE 0.9 % IV BOLUS
1000.0000 mL | Freq: Once | INTRAVENOUS | Status: AC
  Administered 2020-11-20: 1000 mL via INTRAVENOUS

## 2020-11-20 MED ORDER — FENTANYL CITRATE (PF) 100 MCG/2ML IJ SOLN
50.0000 ug | Freq: Once | INTRAMUSCULAR | Status: AC
Start: 2020-11-20 — End: 2020-11-20
  Administered 2020-11-20: 50 ug via INTRAVENOUS
  Filled 2020-11-20: qty 2

## 2020-11-20 MED ORDER — ONDANSETRON HCL 4 MG/2ML IJ SOLN
4.0000 mg | Freq: Once | INTRAMUSCULAR | Status: AC
  Administered 2020-11-20: 4 mg via INTRAVENOUS
  Filled 2020-11-20: qty 2

## 2020-11-20 MED ORDER — OXYCODONE-ACETAMINOPHEN 7.5-325 MG PO TABS
1.0000 | ORAL_TABLET | ORAL | 0 refills | Status: DC | PRN
Start: 2020-11-20 — End: 2020-11-27

## 2020-11-20 NOTE — ED Provider Notes (Signed)
[facility] Emergency Department Provider Note  Time seen: 9:47 PM  I have reviewed the triage vital signs and the nursing notes.   HISTORY  Chief Complaint Vomiting  HPI Raymond Rose is a 45 y.o. male with a past medical history of anxiety, arthritis, COVID-19, diabetes, presents to the emergency department  for nausea vomiting and abdominal discomfort.  According to the patient for the past 2 days he has had intermittent nausea and vomiting along with vague abdominal discomfort.  Patient states a history of DKA in the past which felt fairly similar.  Patient states he has been try to drink fluids today but has not been able to keep anything down so he came to the emergency department for evaluation.  Patient denies any fever or cough.  Patient states he took insulin this morning but it did not seem to bring his blood sugar down.  Past Medical History:  Diagnosis Date  . Anxiety   . Arthritis   . COVID-19 06/2019  . Cyclical vomiting   . Depression   . OSA on CPAP   . Vertigo 01/03/2015    Patient Active Problem List   Diagnosis Date Noted  . Borderline personality disorder ([place]) 08/31/2020  . Flatulence, eructation and gas pain 06/05/2020  . Insomnia due to mental disorder 06/05/2020  . High risk medication use 03/02/2020  . Chronic pain 01/23/2020  . At risk for prolonged QT interval syndrome 12/23/2019  . MDD (major depressive disorder), recurrent, in partial remission ([place]) 12/23/2019  . Microalbuminuria 08/23/2019  . Personality disorder, unspecified ([place]) 07/12/2019  . Bereavement 07/12/2019  . Depression with anxiety 07/03/2019  . COVID-19 virus infection 07/03/2019  . Nausea vomiting and diarrhea 07/03/2019  . MDD (major depressive disorder), recurrent episode, moderate ([place]) 04/21/2019  . GAD (generalized anxiety disorder) 04/21/2019  . Social anxiety disorder 04/21/2019  . Erectile dysfunction 10/03/2017  . Anxiety 08/08/2017  .  Elevated transaminase level 08/28/2016  . Rectus diastasis 02/14/2016  . 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 Panic attacks 06/22/2015  . Depression 06/22/2015  . Diabetes mellitus ([place]) 01/03/2015  . Eating disorder 01/03/2015  . GERD (gastroesophageal reflux disease) 01/03/2015  . Hearing loss 01/03/2015  . Leg cramps 01/03/2015  . Chronic lower back pain 01/03/2015  . Adrenal mass, right ([place]) 06/18/2012  . Obstructive sleep apnea 07/08/2010  . Cluster headache 12/01/2008  . Fam hx-ischem heart disease 10/03/2008  . Obesity, Class III, BMI 40-49.9 (morbid obesity) ([place]) 09/30/2008  . Hyperlipidemia, mixed 12/12/2004    Past Surgical History:  Procedure Laterality Date  . 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 CHOLECYSTECTOMY  2010   lap Cholecystectomy  . 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 CT scan of Brain  12/23/2011   without contrast, [facility], Normal  . TONSILLECTOMY  1985    Prior to Admission medications   Medication Sig Start Date End Date Taking? Authorizing Provider  oxyCODONE-acetaminophen (PERCOCET) 7.5-325 MG tablet Take 1 tablet by mouth every 4 (four) hours as needed. 11/20/20   Birdie Sons, MD  ALPRAZolam Duanne Moron) 0.5 MG tablet TAKE 1 TABLET (0.5 MG TOTAL) BY MOUTH EVERY 4 (FOUR) HOURS AS NEEDED. FOR ANXIETY 08/09/20   Birdie Sons, MD  BD PEN NEEDLE NANO 2ND GEN 32G X 4 MM MISC USE AS DIRECTED 4 TIMES A DAY 09/05/20   Birdie Sons,  MD  blood glucose meter kit and supplies KIT Dispense based on patient and insurance preference. Use up to four times daily as directed. (FOR ICD-9 250.00, 250.01). 01/31/20   Kayleen Memos, DO  Continuous Blood Gluc Sensor (FREESTYLE LIBRE 14 DAY SENSOR) MISC SMARTSIG:1 Each Topical Every 2 Weeks 04/26/20   [provider]  Dulaglutide 3 MG/0.5ML SOPN Inject into the skin. 05/24/20   [provider]  eszopiclone  (LUNESTA) 2 MG TABS tablet Take 1 tablet (2 mg total) by mouth at bedtime as needed for sleep. Take immediately before bedtime 10/18/20   Ursula Alert, MD  fluconazole (DIFLUCAN) 150 MG tablet Take 150 mg by mouth once. 09/20/20   [provider]  hydrOXYzine (VISTARIL) 50 MG capsule TAKE 1 TO 2 CAPSULES BY MOUTH AT BEDTIME AS NEEDED FOR SLEEP 11/13/20   Ursula Alert, MD  insulin aspart (NOVOLOG FLEXPEN) 100 UNIT/ML FlexPen Inject up to 10 units three times daily as directed by physician 03/08/20   Birdie Sons, MD  ketoconazole (NIZORAL) 2 % cream APPLY TO AFFECTED AREA ONCE DAILY FOR 7 DAYS AS NEEDED Patient taking differently: Apply 1 application topically daily. APPLY TO AFFECTED AREA ONCE DAILY FOR 7 DAYS AS NEEDED 03/21/19   Birdie Sons, MD  lamoTRIgine (LAMICTAL) 100 MG tablet Take 1 tablet (100 mg total) by mouth daily. 11/16/20   Ursula Alert, MD  LEVEMIR FLEXTOUCH 100 UNIT/ML FlexPen Inject 28 Units into the skin daily. 03/08/20   Birdie Sons, MD  lithium carbonate 300 MG capsule Take 1-2 capsules (300-600 mg total) by mouth as directed. START TAKING 1 CAP DAILY AM AND 2 CAPS DAILY PM 10/18/20   Ursula Alert, MD  metFORMIN (GLUCOPHAGE-XR) 500 MG 24 hr tablet TAKE 2 TABLETS BY MOUTH TWICE A DAY 09/27/20   Birdie Sons, MD  OLANZapine (ZYPREXA) 2.5 MG tablet Take 1 tablet (2.5 mg total) by mouth at bedtime. 11/16/20   Ursula Alert, MD  omeprazole (PRILOSEC) 40 MG capsule TAKE 1 CAPSULE BY MOUTH EVERY DAY 09/25/20   Birdie Sons, MD  prazosin (MINIPRESS) 2 MG capsule TAKE 1 CAPSULE BY MOUTH AT BEDTIME. 10/13/20   Ursula Alert, MD  promethazine (PHENERGAN) 25 MG tablet TAKE 1 TABLET (25 MG TOTAL) BY MOUTH EVERY 6 (SIX) HOURS AS NEEDED FOR NAUSEA OR VOMITING. 09/27/20   Birdie Sons, MD  rosuvastatin (CRESTOR) 20 MG tablet TAKE 1 TABLET BY MOUTH EVERY DAY Patient taking differently: Take 20 mg by mouth daily. 12/20/19   Birdie Sons, MD  sildenafil (VIAGRA)  50 MG tablet Take 1-2 tablets (50-100 mg total) by mouth daily as needed. 02/22/20   Birdie Sons, MD  tamsulosin (FLOMAX) 0.4 MG CAPS capsule Take 0.4 mg by mouth daily. 03/26/20   [provider]  TRULICITY 1.5 XF/8.1WE SOPN SMARTSIG:0.5 Milliliter(s) SUB-Q Once a Week 02/18/20   [provider]  Vilazodone HCl (VIIBRYD) 40 MG TABS Take 1 tablet (40 mg total) by mouth daily. 10/18/20   Ursula Alert, MD    Allergies  Allergen Reactions  . Bupropion     Extreme anxiety  . Morphine     Family History  Problem Relation Age of Onset  . Depression Mother   . Heart attack Father 56  . Arthritis Other   . Alcohol abuse Other   . Lung cancer Other   . Hyperlipidemia Other   . CAD Other   . Stroke Other   . Hypertension Other   .  Bipolar disorder Other     Social History Social History   Tobacco Use  . Smoking status: Former Smoker    Years: 5.00    Quit date: 06/17/1997    Years since quitting: 23.4  . Smokeless tobacco: Never Used  . Tobacco comment: started smoking at ahe 16, and quit at age 9  Vaping Use  . Vaping Use: Never used  Substance Use Topics  . Alcohol use: Yes    Alcohol/week: 0.0 standard drinks    Comment: occasional alcohol use  . Drug use: No    Review of Systems Constitutional: Negative for fever. Cardiovascular: Negative for chest pain. Respiratory: Negative for shortness of breath. Gastrointestinal: Moderate vague abdominal pain/cramping.  No focal pain.  Positive for nausea vomiting.  Negative for diarrhea. Genitourinary: Negative for urinary compaints Musculoskeletal: Negative for musculoskeletal complaints Neurological: Negative for headache All other ROS negative  ____________________________________________   PHYSICAL EXAM:  VITAL SIGNS: ED Triage Vitals  Enc Vitals Group     BP 11/20/20 1914 136/90     Pulse Rate 11/20/20 1914 (!) 126     Resp 11/20/20 1914 20     Temp 11/20/20 1914 97.9 F (36.6 C)     Temp  Source 11/20/20 1914 Oral     SpO2 11/20/20 1914 97 %     Weight 11/20/20 1918 (!) 330 lb (149.7 kg)     Height 11/20/20 1918 $RemoveBefor'6\' 1"'rwfcKiRIfdmb$  (1.854 m)     Head Circumference --      Peak Flow --      Pain Score 11/20/20 1918 9     Pain Loc --      Pain Edu? --      Excl. in [place]? --     Constitutional: Alert and oriented. Well appearing and in no distress. Eyes: Normal exam ENT      Head: Normocephalic and atraumatic.      Mouth/Throat: Mucous membranes are moist. Cardiovascular: Normal rate, regular rhythm.  Respiratory: Normal respiratory effort without tachypnea nor retractions. Breath sounds are clear Gastrointestinal: Soft, mild diffuse tenderness to palpation without focal tenderness identified. Musculoskeletal: Nontender with normal range of motion in all extremities.  Neurologic:  Normal speech and language. No gross focal neurologic deficits  Skin:  Skin is warm, dry and intact.  Psychiatric: Mood and affect are normal.  ____________________________________________    INITIAL IMPRESSION / ASSESSMENT AND PLAN / ED COURSE  Pertinent labs & imaging results that were available during my care of the patient were reviewed by me and considered in my medical decision making (see chart for details).   Patient presents emergency department for abdominal discomfort nausea and vomiting.  History of DKA in the past.  Patient is lab work shows mild anion gap elevation of 16 we will check a VBG.  Blood glucose moderately elevated at 345 with a bicarb of 18.  We will dose IV insulin and begin IV hydration.  Depending on the VBG and how the patient responds to medications and fluids we will decide upon disposition at that time.  Patient agreeable plan of care.  Overall appears well mild vague abdominal tenderness.  Mildly tachycardic.  VBG is resulted at 7.3.  Very borderline.  We will dose 2 L IV fluids and recheck a BMP.  As long as the patient is able to tolerate oral fluids anticipate likely  discharge home with PCP follow-up.  Patient is agreeable and continues to appear well in the emergency department.  Patient care signed  out to oncoming provider.  Denny Mccree Goldblatt was evaluated in Emergency Department on 11/20/2020 for the symptoms described in the history of present illness. He was evaluated in the context of the global COVID-19 pandemic, which necessitated consideration that the patient might be at risk for infection with the SARS-CoV-2 virus that causes COVID-19. Institutional protocols and algorithms that pertain to the evaluation of patients at risk for COVID-19 are in a state of rapid change based on information released by regulatory bodies including the CDC and federal and state organizations. These policies and algorithms were followed during the patient's care in the ED.  ____________________________________________   FINAL CLINICAL IMPRESSION(S) / ED DIAGNOSES  Nausea vomiting Abdominal pain Hyperglycemia   Harvest Dark, MD 11/20/20 2254

## 2020-11-20 NOTE — ED Triage Notes (Signed)
Pt in with co vomiting x 2 days, also co abdominal pain. Unable to keep food or fluids down, states cbg at home was 283.

## 2020-11-21 LAB — BASIC METABOLIC PANEL
Anion gap: 11 (ref 5–15)
BUN: 12 mg/dL (ref 6–20)
CO2: 19 mmol/L — ABNORMAL LOW (ref 22–32)
Calcium: 8.5 mg/dL — ABNORMAL LOW (ref 8.9–10.3)
Chloride: 104 mmol/L (ref 98–111)
Creatinine, Ser: 0.8 mg/dL (ref 0.61–1.24)
GFR, Estimated: >60 mL/min (ref >60)
Glucose, Bld: 251 mg/dL — ABNORMAL HIGH (ref 70–99)
Potassium: 4 mmol/L (ref 3.5–5.1)
Sodium: 134 mmol/L — ABNORMAL LOW (ref 135–145)

## 2020-11-21 MED ORDER — KETOROLAC TROMETHAMINE 30 MG/ML IJ SOLN
15.0000 mg | INTRAMUSCULAR | Status: AC
  Administered 2020-11-21: 15 mg via INTRAVENOUS
  Filled 2020-11-21: qty 1

## 2020-11-21 MED ORDER — ONDANSETRON HCL 4 MG/2ML IJ SOLN
4.0000 mg | Freq: Once | INTRAMUSCULAR | Status: AC
Start: 2020-11-21 — End: 2020-11-21
  Administered 2020-11-21: 4 mg via INTRAVENOUS
  Filled 2020-11-21: qty 2

## 2020-11-21 MED ORDER — ONDANSETRON 4 MG PO TBDP: 4.0000 mg | ORAL_TABLET | Freq: Three times a day (TID) | ORAL | 0 refills | Status: DC | PRN

## 2020-11-21 NOTE — ED Provider Notes (Signed)
Procedures     ----------------------------------------- 12:51 AM on 11/21/2020 ----------------------------------------- Repeat BMP is normalized.  No evidence of acidosis, blood sugar is 250, sodium is normal.  Stable for discharge, will prescribe Zofran.     Carrie Mew, MD 11/21/20 [phone]

## 2020-11-21 NOTE — ED Notes (Signed)
Patient verbalizes understanding of discharge instructions. Opportunity for questioning and answers were provided. Armband removed by staff, pt discharged from ED. Ambulated out to lobby  

## 2020-11-21 NOTE — ED Notes (Signed)
Pt tolerating water.  

## 2020-11-22 ENCOUNTER — Ambulatory Visit: Payer: Self-pay | Admitting: *Deleted

## 2020-11-22 NOTE — Telephone Encounter (Signed)
I returned call to wife [name] Pio and left a message to call back.   She called right back within 5 minutes.  Raymond Rose was seen in the ED on 11/20/2020 Monday for vomiting.   He has diabetes.   They gave him IV fluids, did blood work and "sent him on his way".    [name] is calling because "He is not doing any better".   "Still vomiting everything up".  "He hasn't had anything stay down since being seen in the ED on Monday".    "I wasn't sure if I should take him back or what".   I instructed her to take him back to the ED.   Especially since he has diabetes and is probably very dehydrated and needs more IV fluids.   She was agreeable to taking him back though she didn't feel like they did much for him last time.   "I don't want him to go into DKA or a diabetic coma".   She is taking him to [facility].    I sent my notes to [facility] for Dr. Maralyn Sago information.    Reason for Disposition . [1] SEVERE vomiting (e.g., 6 or more times/day) AND [2] present > 8 hours (Exception: patient sounds well, is drinking liquids, does not sound dehydrated, and vomiting has lasted less than 24 hours)    Has diabetes.  Answer Assessment - Initial Assessment Questions 1. VOMITING SEVERITY: "How many times have you vomited in the past 24 hours?"     - MILD:  1 - 2 times/day    - MODERATE: 3 - 5 times/day, decreased oral intake without significant weight loss or symptoms of dehydration    - SEVERE: 6 or more times/day, vomits everything or nearly everything, with significant weight loss, symptoms of dehydration      Seen in ED 11/20/2020.   He is vomiting worse now.   He hasn't been able to keep anything down yet.    He has diabetes so I'm worried about him.   They just gave him IV fluids before and "sent him on his way".   "I didn't know what to do at this point".   "Should I take him back to the ED?" 2. ONSET: "When did the vomiting begin?"      He hasn't stopped vomiting or been  able to keep anything down since seen in the ED on 11/20/2020. 3. FLUIDS: "What fluids or food have you vomited up today?" "Have you been able to keep any fluids down?"     Everything he has tried to eat and drink 4. ABDOMINAL PAIN: "Are your having any abdominal pain?" If yes : "How bad is it and what does it feel like?" (e.g., crampy, dull, intermittent, constant)       I instructed [name] to go ahead and and get him back to the ED.  She was agreeable. 5. DIARRHEA: "Is there any diarrhea?" If Yes, ask: "How many times today?"      *No Answer* 6. CONTACTS: "Is there anyone else in the family with the same symptoms?"      *No Answer* 7. CAUSE: "What do you think is causing your vomiting?"     *No Answer* 8. HYDRATION STATUS: "Any signs of dehydration?" (e.g., dry mouth [not only dry lips], too weak to stand) "When did you last urinate?"     *No Answer* 9. OTHER SYMPTOMS: "Do you have any other symptoms?" (e.g., fever, headache, vertigo, vomiting blood  or coffee grounds, recent head injury)     *No Answer* 10. PREGNANCY: "Is there any chance you are pregnant?" "When was your last menstrual period?"       *No Answer*  Protocols used: [facility]

## 2020-11-27 ENCOUNTER — Other Ambulatory Visit: Payer: Self-pay | Admitting: Family Medicine

## 2020-11-28 LAB — BLOOD GAS, VENOUS
Acid-base deficit: 2.6 mmol/L — ABNORMAL HIGH (ref 0.0–2.0)
Bicarbonate: 24.6 mmol/L (ref 20.0–28.0)
O2 Saturation: 77.6 %
Patient temperature: 37
pCO2, Ven: 50 mmHg (ref 44.0–60.0)
pH, Ven: 7.3 (ref 7.250–7.430)
pO2, Ven: 47 mmHg — ABNORMAL HIGH (ref 32.0–45.0)

## 2020-11-29 MED ORDER — OXYCODONE-ACETAMINOPHEN 7.5-325 MG PO TABS: 1.0000 | ORAL_TABLET | ORAL | 0 refills | Status: DC | PRN

## 2020-12-01 ENCOUNTER — Other Ambulatory Visit: Payer: Self-pay

## 2020-12-01 ENCOUNTER — Encounter: Payer: Self-pay | Admitting: Family Medicine

## 2020-12-01 ENCOUNTER — Ambulatory Visit (INDEPENDENT_AMBULATORY_CARE_PROVIDER_SITE_OTHER): Payer: 59 | Admitting: Family Medicine

## 2020-12-01 VITALS — BP 136/88 | HR 89 | Temp 98.2°F | Resp 18 | Wt 327.8 lb

## 2020-12-01 DIAGNOSIS — R251 Tremor, unspecified: Secondary | ICD-10-CM

## 2020-12-01 DIAGNOSIS — R112 Nausea with vomiting, unspecified: Secondary | ICD-10-CM

## 2020-12-01 DIAGNOSIS — F411 Generalized anxiety disorder: Secondary | ICD-10-CM | POA: Diagnosis not present

## 2020-12-01 DIAGNOSIS — F331 Major depressive disorder, recurrent, moderate: Secondary | ICD-10-CM | POA: Diagnosis not present

## 2020-12-01 DIAGNOSIS — R197 Diarrhea, unspecified: Secondary | ICD-10-CM

## 2020-12-01 MED ORDER — PROCHLORPERAZINE MALEATE 10 MG PO TABS: 5.0000 mg | ORAL_TABLET | Freq: Four times a day (QID) | ORAL | 0 refills | Status: DC | PRN

## 2020-12-01 NOTE — Progress Notes (Signed)
Established patient visit   Patient: Raymond Rose   DOB: Aug 19, 1975   45 y.o. Male  MRN: [id number] Visit Date: 12/01/2020  Today's healthcare provider: [name], MD   Chief Complaint  Patient presents with   Tremors   Subjective    HPI  Tremors: Patient complains of involuntary twitching sensations in his arms and legs which started around the time he went to ED for persistent N&V 10 days ago. Has also developed intermittent tremors in his hands the last couple of days. He has chronic nausea and vomiting that has gotten back to baseline since ER visit. He is noted to have started olanzapine in addition to his other chronic psychiatric medications about 2 weeks ago. He denies any fevers, chills, sweats, shock sensations, numbness or tingling.      Medications: Outpatient Medications Prior to Visit  Medication Sig   oxyCODONE-acetaminophen (PERCOCET) 7.5-325 MG tablet Take 1 tablet by mouth every 4 (four) hours as needed.   ALPRAZolam (XANAX) 0.5 MG tablet TAKE 1 TABLET (0.5 MG TOTAL) BY MOUTH EVERY 4 (FOUR) HOURS AS NEEDED. FOR ANXIETY   BD PEN NEEDLE NANO 2ND GEN 32G X 4 MM MISC USE AS DIRECTED 4 TIMES A DAY   blood glucose meter kit and supplies KIT Dispense based on patient and insurance preference. Use up to four times daily as directed. (FOR ICD-9 250.00, 250.01).   Continuous Blood Gluc Sensor (FREESTYLE LIBRE 14 DAY SENSOR) MISC SMARTSIG:1 Each Topical Every 2 Weeks   Dulaglutide 3 MG/0.5ML SOPN Inject into the skin.   eszopiclone (LUNESTA) 2 MG TABS tablet Take 1 tablet (2 mg total) by mouth at bedtime as needed for sleep. Take immediately before bedtime   fluconazole (DIFLUCAN) 150 MG tablet Take 150 mg by mouth once.   hydrOXYzine (VISTARIL) 50 MG capsule TAKE 1 TO 2 CAPSULES BY MOUTH AT BEDTIME AS NEEDED FOR SLEEP   insulin aspart (NOVOLOG FLEXPEN) 100 UNIT/ML FlexPen Inject up to 10 units three times daily as directed by physician   ketoconazole  (NIZORAL) 2 % cream APPLY TO AFFECTED AREA ONCE DAILY FOR 7 DAYS AS NEEDED (Patient taking differently: Apply 1 application topically daily. APPLY TO AFFECTED AREA ONCE DAILY FOR 7 DAYS AS NEEDED)   lamoTRIgine (LAMICTAL) 100 MG tablet Take 1 tablet (100 mg total) by mouth daily.   LEVEMIR FLEXTOUCH 100 UNIT/ML FlexPen Inject 28 Units into the skin daily.   lithium carbonate 300 MG capsule Take 1-2 capsules (300-600 mg total) by mouth as directed. START TAKING 1 CAP DAILY AM AND 2 CAPS DAILY PM   metFORMIN (GLUCOPHAGE-XR) 500 MG 24 hr tablet TAKE 2 TABLETS BY MOUTH TWICE A DAY   OLANZapine (ZYPREXA) 2.5 MG tablet Take 1 tablet (2.5 mg total) by mouth at bedtime.   omeprazole (PRILOSEC) 40 MG capsule TAKE 1 CAPSULE BY MOUTH EVERY DAY   ondansetron (ZOFRAN ODT) 4 MG disintegrating tablet Take 1 tablet (4 mg total) by mouth every 8 (eight) hours as needed for nausea or vomiting.   prazosin (MINIPRESS) 2 MG capsule TAKE 1 CAPSULE BY MOUTH AT BEDTIME.   promethazine (PHENERGAN) 25 MG tablet TAKE 1 TABLET (25 MG TOTAL) BY MOUTH EVERY 6 (SIX) HOURS AS NEEDED FOR NAUSEA OR VOMITING.   rosuvastatin (CRESTOR) 20 MG tablet TAKE 1 TABLET BY MOUTH EVERY DAY (Patient taking differently: Take 20 mg by mouth daily.)   sildenafil (VIAGRA) 50 MG tablet Take 1-2 tablets (50-100 mg total) by mouth daily as  needed.   tamsulosin (FLOMAX) 0.4 MG CAPS capsule Take 0.4 mg by mouth daily.   TRULICITY 1.5 TG/2.5WL SOPN SMARTSIG:0.5 Milliliter(s) SUB-Q Once a Week   Vilazodone HCl (VIIBRYD) 40 MG TABS Take 1 tablet (40 mg total) by mouth daily.   No facility-administered medications prior to visit.    Review of Systems  Constitutional:  Negative for appetite change, chills and fever.  Respiratory:  Negative for chest tightness, shortness of breath and wheezing.   Cardiovascular:  Negative for chest pain and palpitations.  Gastrointestinal:  Negative for abdominal pain, nausea and vomiting.  Neurological:  Positive for  tremors.      Objective    BP 136/88 (BP Location: Left Arm, Patient Position: Sitting, Cuff Size: Normal)   Pulse 89   Temp 98.2 F (36.8 C) (Temporal)   Resp 18   Wt (!) 327 lb 12.8 oz (148.7 kg)   BMI 43.25 kg/m     Physical Exam   General: Appearance:    Severely obese male in no acute distress  Eyes:    PERRL, conjunctiva/corneas clear, EOM's intact       Lungs:     Clear to auscultation bilaterally, respirations unlabored  Heart:    Normal heart rate. Normal rhythm. No murmurs, rubs, or gallops.   MS:   All extremities are intact.   Neurologic:   Awake, alert, oriented x 3. No apparent focal neurological           defect. DTRs of Ues and Les +1        Assessment & Plan     1. Tremor Onset several days after adding olanzapine to his psychiatric medications. Olanzapine dose is very low, but may be enough to push lithium or Viibryd to toxic levels. Will reduce Viibryd dose of the time being.  - TSH - T4, free  2. GAD (generalized anxiety disorder)   3. Nausea vomiting and diarrhea Chronic, phenergan and ondansetron not effective, will try- prochlorperazine (COMPAZINE) 10 MG tablet; Take 0.5-1 tablets (5-10 mg total) by mouth every 6 (six) hours as needed for nausea or vomiting.  Dispense: 30 tablet; Refill: 0  4. MDD (major depressive disorder), recurrent episode, moderate (HCC)  - CBC - Comprehensive metabolic panel - Lithium level   No follow-ups on file.      The entirety of the information documented in the History of Present Illness, Review of Systems and Physical Exam were personally obtained by me. Portions of this information were initially documented by the CMA and reviewed by me for thoroughness and accuracy.     [name], MD  [facility] [phone] (phone) [phone] (fax)  [place]

## 2020-12-01 NOTE — Patient Instructions (Addendum)
Reduce Viibyrd to 1/2 tablet daily

## 2020-12-04 ENCOUNTER — Telehealth (INDEPENDENT_AMBULATORY_CARE_PROVIDER_SITE_OTHER): Payer: 59 | Admitting: Psychiatry

## 2020-12-04 ENCOUNTER — Other Ambulatory Visit: Payer: Self-pay

## 2020-12-04 DIAGNOSIS — Z5329 Procedure and treatment not carried out because of patient's decision for other reasons: Secondary | ICD-10-CM

## 2020-12-04 LAB — HM DIABETES EYE EXAM

## 2020-12-04 NOTE — Progress Notes (Signed)
No response to call or text or video invite I have communicated with the front desk to give this patient a call to reschedule this appointment as soon as possible since patient needing continued medication management. I have also sent communication to his therapist.

## 2020-12-05 ENCOUNTER — Encounter: Payer: Self-pay | Admitting: Psychiatry

## 2020-12-05 ENCOUNTER — Telehealth (INDEPENDENT_AMBULATORY_CARE_PROVIDER_SITE_OTHER): Payer: 59 | Admitting: Psychiatry

## 2020-12-05 DIAGNOSIS — Z79899 Other long term (current) drug therapy: Secondary | ICD-10-CM

## 2020-12-05 DIAGNOSIS — F411 Generalized anxiety disorder: Secondary | ICD-10-CM

## 2020-12-05 DIAGNOSIS — F330 Major depressive disorder, recurrent, mild: Secondary | ICD-10-CM | POA: Diagnosis not present

## 2020-12-05 DIAGNOSIS — F4010 Social phobia, unspecified: Secondary | ICD-10-CM | POA: Diagnosis not present

## 2020-12-05 DIAGNOSIS — F5105 Insomnia due to other mental disorder: Secondary | ICD-10-CM

## 2020-12-05 LAB — COMPREHENSIVE METABOLIC PANEL
ALT: 40 IU/L (ref 0–44)
AST: 31 IU/L (ref 0–40)
Albumin/Globulin Ratio: 2.4 — ABNORMAL HIGH (ref 1.2–2.2)
Albumin: 4.5 g/dL (ref 4.0–5.0)
Alkaline Phosphatase: 115 IU/L (ref 44–121)
BUN/Creatinine Ratio: 11 (ref 9–20)
BUN: 8 mg/dL (ref 6–24)
Bilirubin Total: 0.7 mg/dL (ref 0.0–1.2)
CO2: 23 mmol/L (ref 20–29)
Calcium: 9.9 mg/dL (ref 8.7–10.2)
Chloride: 100 mmol/L (ref 96–106)
Creatinine, Ser: 0.76 mg/dL (ref 0.76–1.27)
Globulin, Total: 1.9 g/dL (ref 1.5–4.5)
Glucose: 290 mg/dL — ABNORMAL HIGH (ref 65–99)
Potassium: 4.7 mmol/L (ref 3.5–5.2)
Sodium: 135 mmol/L (ref 134–144)
Total Protein: 6.4 g/dL (ref 6.0–8.5)
eGFR: 115 mL/min/{1.73_m2} (ref >59)

## 2020-12-05 LAB — CBC
Hematocrit: 42.5 % (ref 37.5–51.0)
Hemoglobin: 14.1 g/dL (ref 13.0–17.7)
MCH: 28 pg (ref 26.6–33.0)
MCHC: 33.2 g/dL (ref 31.5–35.7)
MCV: 85 fL (ref 79–97)
Platelets: 254 10*3/uL (ref 150–450)
RBC: 5.03 x10E6/uL (ref 4.14–5.80)
RDW: 13.3 % (ref 11.6–15.4)
WBC: 6.2 10*3/uL (ref 3.4–10.8)

## 2020-12-05 LAB — T4, FREE: Free T4: 0.97 ng/dL (ref 0.82–1.77)

## 2020-12-05 LAB — LITHIUM LEVEL: Lithium Lvl: 0.6 mmol/L (ref 0.5–1.2)

## 2020-12-05 LAB — TSH: TSH: 1.59 u[IU]/mL (ref 0.450–4.500)

## 2020-12-05 MED ORDER — PRAZOSIN HCL 2 MG PO CAPS: 2.0000 mg | ORAL_CAPSULE | Freq: Every day | ORAL | 1 refills | Status: DC

## 2020-12-05 MED ORDER — LITHIUM CARBONATE 300 MG PO CAPS: 300.0000 mg | ORAL_CAPSULE | ORAL | 1 refills | Status: DC

## 2020-12-05 NOTE — Progress Notes (Signed)
Virtual Visit via Video Note  I connected with [name] on 12/05/20 at  1:30 PM EDT by a video enabled telemedicine application and verified that I am speaking with the correct person using two identifiers.  Location Provider Location : [facility] Patient Location : Home  Participants: Patient , Provider   I discussed the limitations of evaluation and management by telemedicine and the availability of in person appointments. The patient expressed understanding and agreed to proceed.    I discussed the assessment and treatment plan with the patient. The patient was provided an opportunity to ask questions and all were answered. The patient agreed with the plan and demonstrated an understanding of the instructions.   The patient was advised to call back or seek an in-person evaluation if the symptoms worsen or if the condition fails to improve as anticipated.   [facility] MD OP Progress Note  12/05/2020 2:04 PM [place]  MRN:  [id number]  Chief Complaint:  Chief Complaint   Follow-up; Depression; Anxiety    HPI: Raymond Rose is a 45 year old Caucasian male, married, lives in [place], has a history of MDD, GAD, social anxiety disorder, sleep apnea, cyclical vomiting, diabetes mellitus, erectile dysfunction was evaluated by telemedicine today.  Patient today reports mood wise he is doing better.  He denies any significant sadness.  He is anxious about his recent job interviews however he is coping okay.  He looks forward to getting a job and feels good about his most recent interviews.  Patient however reports he continues to struggle with nausea and vomiting.  He had to go to the emergency department twice beginning of June.  Patient reports recently his primary care provider added prochlorperazine for his nausea.  Since being on that he feels much better.  It is taking the edge off.  Patient reports sleep is good.  Denies any self-injurious thoughts.  Compliant on  medications as prescribed.  Denies side effects.  Patient reports he did have tremors, muscle jerks recently however even though he stayed on the same medications and did not make any dose reductions his muscle jerks and tremors went away.  No tremors were observed in session today.  Visit Diagnosis:    ICD-10-CM   1. MDD (major depressive disorder), recurrent episode, mild (HCC)  F33.0 lithium carbonate 300 MG capsule    2. GAD (generalized anxiety disorder)  F41.1     3. 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 High risk medication use  Z79.899       Past Psychiatric History: Reviewed past psychiatric history from progress note on 04/21/2019.  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, 3cm adrenal mass on left   CT scan of Brain  12/23/2011   without contrast, [facility], Normal   TONSILLECTOMY  1985    Family  Psychiatric History: Reviewed family psychiatric history from progress note on 04/21/2019  Family History:  Family History  Problem Relation Age of Onset   Depression Mother    Heart attack Father 81   Arthritis Other    Alcohol abuse Other    Lung cancer Other    Hyperlipidemia Other    CAD Other    Stroke Other    Hypertension Other    Bipolar disorder Other     Social History: Reviewed social history from progress note on 04/21/2019 Social History   Socioeconomic History   Marital status: Married     Spouse name: Not on file   Number of children: Not on file   Years of education: Not on file   Highest education level: Not on file  Occupational History   Occupation: Banking    Comment: [employer]  Tobacco Use   Smoking status: Former    Years: 5.00    Pack years: 0.00    Types: Cigarettes    Quit date: 06/17/1997    Years since quitting: 23.4   Smokeless tobacco: Never   Tobacco comments:    started smoking at [age]he 16, and quit at age 49  Vaping Use   Vaping Use: Never used  Substance and Sexual Activity   Alcohol use: Yes    Alcohol/week: 0.0 standard drinks    Comment: occasional alcohol use   Drug use: No   Sexual activity: Not on file  Other Topics Concern   Not on file  Social History Narrative   Not on file   Social Determinants of Health   Financial Resource Strain: Not on file  Food Insecurity: Not on file  Transportation Needs: Not on file  Physical Activity: Not on file  Stress: Not on file  Social Connections: Not on file    Allergies:  Allergies  Allergen Reactions   Bupropion     Extreme anxiety   Morphine     Metabolic Disorder Labs: Lab Results  Component Value Date   HGBA1C 9.5 (A) 10/03/2020   MPG 243.17 01/23/2020   No results found for: PROLACTIN Lab Results  Component Value Date   CHOL 140 10/03/2020   TRIG 259 (H) 10/03/2020   HDL 37 (L) 10/03/2020   CHOLHDL 3.8 10/03/2020   LDLCALC 62 10/03/2020   LDLCALC 42 05/24/2019   Lab Results  Component Value Date   TSH 1.590 12/04/2020   TSH 2.160 10/03/2020    Therapeutic Level Labs: Lab Results  Component Value Date   LITHIUM 0.6 12/04/2020   LITHIUM 0.37 (L) 09/08/2020   No results found for: VALPROATE No components found for:  CBMZ  Current Medications: Current Outpatient Medications  Medication Sig Dispense Refill   oxyCODONE-acetaminophen (PERCOCET) 7.5-325 MG tablet Take 1 tablet by mouth every 4 (four) hours as needed. 60 tablet 0   ALPRAZolam (XANAX) 0.5 MG  tablet TAKE 1 TABLET (0.5 MG TOTAL) BY MOUTH EVERY 4 (FOUR) HOURS AS NEEDED. FOR ANXIETY 30 tablet 2   BD PEN NEEDLE NANO 2ND GEN 32G X 4 MM MISC USE AS DIRECTED 4 TIMES A DAY 100 each 3   blood glucose meter kit and supplies KIT Dispense based on patient and insurance preference. Use up to four times daily as directed. (FOR ICD-9 250.00, 250.01). 1 each 0   Continuous Blood Gluc Sensor (FREESTYLE LIBRE 14 DAY SENSOR) MISC SMARTSIG:1 Each Topical Every 2 Weeks     Dulaglutide 3 MG/0.5ML SOPN Inject into the skin.  eszopiclone (LUNESTA) 2 MG TABS tablet Take 1 tablet (2 mg total) by mouth at bedtime as needed for sleep. Take immediately before bedtime 30 tablet 2   fluconazole (DIFLUCAN) 150 MG tablet Take 150 mg by mouth once.     hydrOXYzine (VISTARIL) 50 MG capsule TAKE 1 TO 2 CAPSULES BY MOUTH AT BEDTIME AS NEEDED FOR SLEEP 60 capsule 1   insulin aspart (NOVOLOG FLEXPEN) 100 UNIT/ML FlexPen Inject up to 10 units three times daily as directed by physician 15 mL 5   ketoconazole (NIZORAL) 2 % cream APPLY TO AFFECTED AREA ONCE DAILY FOR 7 DAYS AS NEEDED (Patient taking differently: Apply 1 application topically daily. APPLY TO AFFECTED AREA ONCE DAILY FOR 7 DAYS AS NEEDED) 15 g 3   lamoTRIgine (LAMICTAL) 100 MG tablet Take 1 tablet (100 mg total) by mouth daily. 90 tablet 0   LEVEMIR FLEXTOUCH 100 UNIT/ML FlexPen Inject 28 Units into the skin daily. 15 mL 5   lithium carbonate 300 MG capsule Take 1-2 capsules (300-600 mg total) by mouth as directed. START TAKING 1 CAP DAILY AM AND 2 CAPS DAILY PM 90 capsule 1   metFORMIN (GLUCOPHAGE-XR) 500 MG 24 hr tablet TAKE 2 TABLETS BY MOUTH TWICE A DAY 120 tablet 3   omeprazole (PRILOSEC) 40 MG capsule TAKE 1 CAPSULE BY MOUTH EVERY DAY 90 capsule 0   prazosin (MINIPRESS) 2 MG capsule Take 1 capsule (2 mg total) by mouth at bedtime. 30 capsule 1   prochlorperazine (COMPAZINE) 10 MG tablet Take 0.5-1 tablets (5-10 mg total) by mouth every 6 (six) hours as  needed for nausea or vomiting. 30 tablet 0   rosuvastatin (CRESTOR) 20 MG tablet TAKE 1 TABLET BY MOUTH EVERY DAY (Patient taking differently: Take 20 mg by mouth daily.) 30 tablet 12   sildenafil (VIAGRA) 50 MG tablet Take 1-2 tablets (50-100 mg total) by mouth daily as needed. 90 tablet 3   tamsulosin (FLOMAX) 0.4 MG CAPS capsule Take 0.4 mg by mouth daily.     TRULICITY 1.5 MG/0.5ML SOPN SMARTSIG:0.5 Milliliter(s) SUB-Q Once a Week     Vilazodone HCl (VIIBRYD) 40 MG TABS Take 1 tablet (40 mg total) by mouth daily. 90 tablet 0   No current facility-administered medications for this visit.     Musculoskeletal: Strength & Muscle Tone:  UTA Gait & Station: normal Patient leans: N/A  Psychiatric Specialty Exam: Review of Systems  Gastrointestinal:  Positive for nausea.  Psychiatric/Behavioral:  The patient is nervous/anxious.   All other systems reviewed and are negative.  There were no vitals taken for this visit.There is no height or weight on file to calculate BMI.  General Appearance: Casual  Eye Contact:  Fair  Speech:  Clear and Coherent  Volume:  Normal  Mood:  Anxious  Affect:  Congruent  Thought Process:  Goal Directed and Descriptions of Associations: Intact  Orientation:  Full (Time, Place, and Person)  Thought Content: Logical   Suicidal Thoughts:  No  Homicidal Thoughts:  No  Memory:  Immediate;   Fair Recent;   Fair Remote;   Fair  Judgement:  Fair  Insight:  Fair  Psychomotor Activity:  Normal  Concentration:  Concentration: Fair and Attention Span: Fair  Recall:  [employer] of Knowledge: Good  Language: Fair  Akathisia:  No  Handed:  Right  AIMS (if indicated): done  Assets:  Communication Skills Desire for Improvement Housing Intimacy Social Support Talents/Skills Transportation Vocational/Educational  ADL's:  Intact  Cognition: WNL  Sleep:  Fair   Screenings: GAD-7    Flowsheet Row Video Visit from 10/18/2020 in [place] from 03/10/2020 in [place] Office Visit from 04/16/2019 in [facility]  Total GAD-7 Score $RemoveBef'6 13 20      'yJsuKTvZAz$ PHQ2-9    [place] Visit from 12/01/2020 in [facility] Video Visit from 11/16/2020 in [place] Video Visit from 10/18/2020 in [place] Visit from 10/03/2020 in [place] from 08/31/2020 in [place]  PHQ-2 Total Score 2 1 0 5 6  PHQ-9 Total Score $RemoveBef'11 9 4 19 16      'vgRLjncoBI$ Flowsheet Row ED from 11/20/2020 in [place] Counselor from 08/31/2020 in [place] Counselor from 08/09/2020 in [place] Error: Question 6 not populated Low Risk Low Risk        Assessment and Plan: Hurbert Duran Davie is a 45 year old Caucasian male, employed, [place], has a history of MDD, OSA on CPAP, diabetes mellitus, arthritis was evaluated by telemedicine today.  Patient is biologically predisposed given history of family mental health problems.  Patient with cyclical vomiting is currently making progress with regards to his mood.  Discussed plan as noted below.  Plan MDD-improving Viibryd 40 mg p.o. daily Lithium 900 mg p.o. daily Lamictal at reduced dosage of 100 mg p.o. daily Discontinue Zyprexa.  Patient was recently started on prochlorperazine.  Discussed drug to drug interaction with these medications and lithium as well as Viibryd. Provided education about serotonin syndrome, neuroleptic malignant syndrome, CNS depressant effect.  Patient will monitor.  GAD-improving Continue CBT with Ms. Christina Hussami Xanax as prescribed per primary care provider. Hydroxyzine 50-100 mg p.o. nightly  Social anxiety disorder-improving Continue  CBT  Insomnia-stable Prazosin 2 mg p.o. nightly Lunesta 2 mg p.o. nightly   High risk medication use-reviewed and discussed labs dated 12/04/2020-lithium level-0.6-therapeutic, TSH-1.590-within normal limits, CMP-glucose elevated at 290 otherwise within normal limits.  I have coordinated care with Ms. Christina Hussami.  I have spent atleast 30 minutes face to face with patient today which includes the time spent for preparing to see the patient ( e.g., review of test, records ), obtaining and to review and separately obtained history , ordering medications and test ,psychoeducation and supportive psychotherapy and care coordination,as well as documenting clinical information in electronic health record,interpreting and communication of test results   Follow-up in clinic in 4 to 5 weeks in office.  This note was generated in part or whole with voice recognition software. Voice recognition is usually quite accurate but there are transcription errors that can and very often do occur. I apologize for any typographical errors that were not detected and corrected.      Ursula Alert, MD 12/06/2020, 6:30 PM

## 2020-12-05 NOTE — Patient Instructions (Signed)
Neuroleptic Malignant Syndrome  Neuroleptic malignant syndrome is a rare disorder that causes a high fever, stiff muscles, and changes in mental status. It occurs in a small number of people who are taking a neuroleptic medicine. These medicines block the effects of the brain chemical dopamine. This is a life-threatening condition thatrequires treatment in the hospital right away. Neuroleptic malignant syndrome is a serious problem that is an emergency. Do not wait to see if the condition will go away. Get medical help right away. Call your local emergency services (911 in the [place] Do not drive yourself to the hospital. What are the causes? The exact cause of the reaction is not known. It may be caused by an adverse reaction to: Certain antipsychotic (neuroleptic) medicines. Certain medicines for nausea and vomiting. The disorder may also occur in someone who suddenly stops taking some medicines for Parkinson's disease or Huntington's disease (dopaminergic medicines). What increases the risk? You are more likely to develop this condition if you are taking neuroleptic or antipsychotic medicines. Your risk may be higher if: You take a certain type of neuroleptic medicine (high potency medicine). Your dose of the medicine is increased over a short period of time. You switch from one neuroleptic medicine to another. You receive the medicine through an injection or IV. You have dehydration, exhaustion, or iron deficiency. What are the signs or symptoms? Symptoms of this condition usually develop within the first 2 weeks of taking the medicine. Changes in mental status often appear first. Once the symptoms of this condition develop, they often become worse quickly. Symptoms may include: High fever along with heavy sweating. Stiff muscles. This may make movements difficult. Difficulty swallowing. Uncontrolled shaking of different parts of your body (tremor). Lack of energy (lethargy). Agitation and  confusion. Unresponsiveness and reduced level of consciousness (stupor). High blood pressure. Fast heartbeat. Loss of bladder control. How is this diagnosed? This condition is diagnosed based on your symptoms and your history of taking neuroleptic or antipsychotic medicines. There is no specific test that can be used to diagnose this condition. However, various tests will be done to rule out other conditions that can cause your symptoms. Tests may include: Blood tests. Urine tests. Cerebral spinal fluid tests. X-rays of your chest. Imaging studies to check the central nervous system (neuroimaging tests). Tests to check the electrical activity in your brain (electroencephalogram or EEG). How is this treated? Treatment for this condition includes stopping the medicine that caused the condition. In addition, the symptoms need to be treated in the hospital right away. Treatment may include: Methods to reduce a high fever by quickly cooling your body. Examples include using ice packs or wrapping your body in a cooling blanket. Medicines to reduce fever may also be used. Medicines to increase dopamine levels. Medicines to relax the muscles or to treat changes in your mental status. IV fluids to replace lost fluids. Monitoring in an intensive care unit and treating any complications that arise. Follow these instructions at home: Take over-the-counter and prescription medicines only as told by your health care provider.  Do not stop or start taking any medicines without talking to your health care provider first. Work closely with your health care provider and mental health professional to monitor the condition for which you were taking the neuroleptic or antipsychotic medicine. This will help ensure that your condition is properly treated after the medicine is stopped. Keep all follow-up visits as told by your health care provider. This is important. Contact a health care  provider if: You have new  symptoms. Get help right away if: Your symptoms return even after treatment. Summary Neuroleptic malignant syndrome is a rare disorder that causes a high fever, stiff muscles, and changes in mental status. This is a life-threatening condition that requires treatment in the hospital right away. Work closely with your health care provider and mental health professional to monitor the condition for which you were taking the neuroleptic or antipsychotic medicine. This will help ensure that your condition is properly treated after the medicine is stopped. This information is not intended to replace advice given to you by your health care provider. Make sure you discuss any questions you have with your healthcare provider. Document Revised: 06/25/2017 Document Reviewed: 06/25/2017 Elsevier Patient Education  2022 [place]. Serotonin Syndrome Serotonin is a chemical in your body (neurotransmitter) that helps to control several functions, such as: Brain and nerve cell function. Mood and emotions. Memory. Eating. Sleeping. Sexual activity. Stress response. Having too much serotonin in your body can cause serotonin syndrome. This condition can be harmful to your brain and nerve cells. This can be alife-threatening condition. What are the causes? This condition may be caused by taking medicines or drugs that increase the level of serotonin in your body, such as: Antidepressant medicines. Migraine medicines. Certain pain medicines. Certain drugs, including ecstasy, LSD, cocaine, and amphetamines. Over-the-counter cough or cold medicines that contain dextromethorphan. Certain herbal supplements, including St. John's wort, ginseng, and nutmeg. This condition usually occurs when you take these medicines or drugs in combination, but it can also happen with a high dose of a single medicine ordrug. What increases the risk? You are more likely to develop this condition if: You just started taking a  medicine or drug that increases the level of serotonin in the body. You recently increased the dose of a medicine or drug that increases the level of serotonin in the body. You take more than one medicine or drug that increases the level of serotonin in the body. What are the signs or symptoms? Symptoms of this condition usually start within several hours of taking a medicine or drug. Symptoms may be mild or severe. Mild symptoms include: Sweating. Restlessness or agitation. Muscle twitching or stiffness. Rapid heart rate. Nausea and vomiting. Diarrhea. Headache. Shivering or goose bumps. Confusion. Severe symptoms include: Irregular heartbeat. Seizures. Loss of consciousness. High fever. How is this diagnosed? This condition may be diagnosed based on: Your medical history.  A physical exam. Your prior use of drugs and medicines. Blood or urine tests. These may be used to rule out other causes of your symptoms. How is this treated? The treatment for this condition depends on the severity of your symptoms. For mild cases, stopping the medicine or drug that caused your condition is usually all that is needed. For moderate to severe cases, treatment in a hospital may be needed to prevent or manage life-threatening symptoms. This may include medicines to control your symptoms, IV fluids, interventions to support your breathing, and treatments to control your body temperature. Follow these instructions at home: Medicines  Take over-the-counter and prescription medicines only as told by your health care provider. This is important. Check with your health care provider before you start taking any new prescriptions, over-the-counter medicines, herbs, or supplements. Avoid combining any medicines that can cause this condition to occur.  Lifestyle  Maintain a healthy lifestyle. Eat a healthy diet that includes plenty of vegetables, fruits, whole grains, low-fat dairy products, and lean  protein. Do  not eat a lot of foods that are high in fat, added sugars, or salt. Get the right amount and quality of sleep. Most adults need 7-9 hours of sleep each night. Make time to exercise, even if it is only for short periods of time. Most adults should exercise for at least 150 minutes each week. Do not drink alcohol. Do not use illegal drugs, and do not take medicines for reasons other than they are prescribed.  General instructions Do not use any products that contain nicotine or tobacco, such as cigarettes and e-cigarettes. If you need help quitting, ask your health care provider. Keep all follow-up visits as told by your health care provider. This is important. Contact a health care provider if: Your symptoms do not improve or they get worse. Get help right away if you: Have worsening confusion, severe headache, chest pain, high fever, seizures, or loss of consciousness. Experience serious side effects of medicine, such as swelling of your face, lips, tongue, or throat. Have serious thoughts about hurting yourself or others. These symptoms may represent a serious problem that is an emergency. Do not wait to see if the symptoms will go away. Get medical help right away. Call your local emergency services (911 in the [place] Do not drive yourself to the hospital. If you ever feel like you may hurt yourself or others, or have thoughts about taking your own life, get help right away. You can go to your nearest emergency department or call: Your local emergency services (911 in the [place] A suicide crisis helpline, such as the [place] at 236-644-9516. This is open 24 hours a day. Summary Serotonin is a brain chemical that helps to regulate the nervous system. High levels of serotonin in the body can cause serotonin syndrome, which is a very dangerous condition. This condition may be caused by taking medicines or drugs that increase the level of serotonin in  your body. Treatment depends on the severity of your symptoms. For mild cases, stopping the medicine or drug that caused your condition is usually all that is needed. Check with your health care provider before you start taking any new prescriptions, over-the-counter medicines, herbs, or supplements. This information is not intended to replace advice given to you by your health care provider. Make sure you discuss any questions you have with your healthcare provider. Document Revised: 07/11/2017 Document Reviewed: 07/11/2017 Elsevier Patient Education  2022 [employer].

## 2020-12-07 ENCOUNTER — Other Ambulatory Visit: Payer: Self-pay | Admitting: Family Medicine

## 2020-12-07 ENCOUNTER — Other Ambulatory Visit: Payer: Self-pay

## 2020-12-07 ENCOUNTER — Ambulatory Visit (INDEPENDENT_AMBULATORY_CARE_PROVIDER_SITE_OTHER): Payer: 59 | Admitting: Licensed Clinical Social Worker

## 2020-12-07 DIAGNOSIS — F411 Generalized anxiety disorder: Secondary | ICD-10-CM

## 2020-12-07 DIAGNOSIS — F330 Major depressive disorder, recurrent, mild: Secondary | ICD-10-CM | POA: Diagnosis not present

## 2020-12-07 NOTE — Progress Notes (Signed)
Virtual Visit via Video Note  I connected with Raymond Rose on 12/07/20 at  8:00 AM EDT by a video enabled telemedicine application and verified that I am speaking with the correct person using two identifiers.  Location: Patient: home Provider: remote office [place], [place])   I discussed the limitations of evaluation and management by telemedicine and the availability of in person appointments. The patient expressed understanding and agreed to proceed.  I discussed the assessment and treatment plan with the patient. The patient was provided an opportunity to ask questions and all were answered. The patient agreed with the plan and demonstrated an understanding of the instructions.   The patient was advised to call back or seek an in-person evaluation if the symptoms worsen or if the condition fails to improve as anticipated.  I provided 35 minutes of non-face-to-face time during this encounter.   Kalyssa Anker R Abrianna Sidman, LCSW   THERAPIST PROGRESS NOTE  Session Time: 8-8:35a  Participation Level: Active  Behavioral Response: NeatAlertDepressed  Type of Therapy: Individual Therapy  Treatment Goals addressed: Diagnosis: depression ; managing complex medical issues  Interventions: Solution Focused and Strength-based  Summary: Raymond Rose is a 45 y.o. male who presents with improving symptoms related to depression and anxiety. Raymond Rose reports that overall mood is stable and that he is managing situational stressors well. Pt denies any suicidal ideation. Pt reports overall good quality and quantity of sleep with occasional episodes of insomnia--primarily hard to fall asleep on those occasions.  Allowed pt to explore and express thoughts and feelings associated with recent life situations and external stressors. Raymond Rose is excited about recent job interviews x 3. 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  Raymond Rose is also excited about sister coming to visit this weekend--pt has not spent time with her in quite a while. Discussed current management of complex health issues: full body pain, DM glucose monitoring, nutrition, and cyclical vomiting. Discussed recent visits to both [facility] and [place].  Continued recommendations are as follows: self care behaviors, positive social engagements, focusing on overall work/home/life balance, and focusing on positive physical and emotional wellness.    Suicidal/Homicidal: No  Therapist Response: Raymond Rose is continuing to utilize behavioral strategies to overcome depression. Alter is aware of how important it is for him to implement a regular exercise regimen as a depression reduction technique. Raymond Rose is able to engage in recreational activities that reflect increased energy and interest. Raymond Rose has an improved level of self awareness and is setting positive goals for the future. These behaviors are reflective of both personal growth and progress. Treatment to continue as indicated.  Plan: Return again in 4 weeks.  Diagnosis: Axis I: MDD, recurrent, mild; GAD    Axis II: No diagnosis    Rachel Bo Doloris Servantes, LCSW 12/07/2020

## 2020-12-08 MED ORDER — OXYCODONE-ACETAMINOPHEN 7.5-325 MG PO TABS: 1.0000 | ORAL_TABLET | ORAL | 0 refills | Status: DC | PRN

## 2020-12-16 ENCOUNTER — Other Ambulatory Visit: Payer: Self-pay | Admitting: Family Medicine

## 2020-12-16 DIAGNOSIS — R112 Nausea with vomiting, unspecified: Secondary | ICD-10-CM

## 2020-12-16 DIAGNOSIS — R197 Diarrhea, unspecified: Secondary | ICD-10-CM

## 2020-12-16 DIAGNOSIS — F419 Anxiety disorder, unspecified: Secondary | ICD-10-CM

## 2020-12-16 NOTE — Telephone Encounter (Signed)
Requested medication (s) are due for refill today: yes  Requested medication (s) are on the active medication list: yes except for Diflucan  Last refill:  Compazine: 12/01/20      Diflucan: 10/18/20            Xanax: 08/09/20        Prednisone:   "ended" 09/27/20  Future visit scheduled: no  Notes to clinic:  All meds requested not delegated to NT to RF Diflucan: historical med and provider    Requested Prescriptions  Pending Prescriptions Disp Refills   prochlorperazine (COMPAZINE) 10 MG tablet [Pharmacy Med Name: PROCHLORPERAZINE 10 MG TAB] 30 tablet 0    Sig: Take 0.5-1 tablets (5-10 mg total) by mouth every 6 (six) hours as needed for nausea or vomiting.      Not Delegated - Gastroenterology: Antiemetics Failed - 12/16/2020  1:47 PM      Failed - This refill cannot be delegated      Passed - Valid encounter within last 6 months    Recent Outpatient Visits           2 weeks ago Tremor   [facility] Birdie Sons, MD   2 months ago Type 2 diabetes mellitus with ketoacidosis without coma, without long-term current use of insulin [facility])   [facility] Birdie Sons, MD   5 months ago Cough   [place], [place], [place]   8 months ago Chronic hip pain, bilateral   [facility] Birdie Sons, MD   10 months ago Type 2 diabetes mellitus with ketoacidosis without coma, without long-term current use of insulin ([place])   [facility] Birdie Sons, MD                  fluconazole (DIFLUCAN) 150 MG tablet [Pharmacy Med Name: FLUCONAZOLE 150 MG TABLET] 1 tablet 3    Sig: TAKE 1 TABLET BY MOUTH AS ONE DOSE      Off-Protocol Failed - 12/16/2020  1:47 PM      Failed - Medication not assigned to a protocol, review manually.      Passed - Valid encounter within last 12 months    Recent Outpatient Visits           2 weeks ago Tremor   [facility] Birdie Sons, MD   2 months  ago Type 2 diabetes mellitus with ketoacidosis without coma, without long-term current use of insulin [facility])   [facility] Birdie Sons, MD   5 months ago Cough   [place], [place], PA-C   8 months ago Chronic hip pain, bilateral   [facility] Birdie Sons, MD   10 months ago Type 2 diabetes mellitus with ketoacidosis without coma, without long-term current use of insulin ([place])   [place], Kirstie Peri, MD                  ALPRAZolam Duanne Moron) 0.5 MG tablet [Pharmacy Med Name: ALPRAZOLAM 0.5 MG TABLET] 30 tablet     Sig: TAKE 1 TABLET BY MOUTH EVERY 4 HOURS AS NEEDED FOR ANXIETY. DAY SUPPLY PER INSURANCE.      Not Delegated - Psychiatry:  Anxiolytics/Hypnotics Failed - 12/16/2020  1:47 PM      Failed - This refill cannot be delegated      Failed - Urine Drug Screen completed in last 360 days  Passed - Valid encounter within last 6 months    Recent Outpatient Visits           2 weeks ago Tremor   [facility] Birdie Sons, MD   2 months ago Type 2 diabetes mellitus with ketoacidosis without coma, without long-term current use of insulin [facility])   [facility] Birdie Sons, MD   5 months ago Cough   [facility] Carles Collet M, PA-C   8 months ago Chronic hip pain, bilateral   [facility] Birdie Sons, MD   10 months ago Type 2 diabetes mellitus with ketoacidosis without coma, without long-term current use of insulin ([place])   [facility] Birdie Sons, MD                  predniSONE (DELTASONE) 10 MG tablet [Pharmacy Med Name: PREDNISONE 10 MG TABLET] 42 tablet 0    Sig: TAKE 6 TABLETS FOR 2 DAYS, THEN 5 FOR 2 DAYS, THEN 4 FOR 2 DAYS, THEN 3 FOR 2 DAYS, THEN 2 FOR 2 DAYS, THEN 1 FOR 2 DAYS.      Not Delegated - Endocrinology:  Oral Corticosteroids Failed - 12/16/2020  1:47 PM      Failed - This  refill cannot be delegated      Passed - Last BP in normal range    BP Readings from Last 1 Encounters:  12/01/20 136/88          Passed - Valid encounter within last 6 months    Recent Outpatient Visits           2 weeks ago Tremor   [facility] Birdie Sons, MD   2 months ago Type 2 diabetes mellitus with ketoacidosis without coma, without long-term current use of insulin [facility])   [facility] Birdie Sons, MD   5 months ago Cough   [facility] Carles Collet M, [place]   8 months ago Chronic hip pain, bilateral   [facility] Birdie Sons, MD   10 months ago Type 2 diabetes mellitus with ketoacidosis without coma, without long-term current use of insulin [facility])   [facility] Birdie Sons, MD

## 2020-12-19 ENCOUNTER — Other Ambulatory Visit: Payer: Self-pay | Admitting: Family Medicine

## 2020-12-21 MED ORDER — OXYCODONE-ACETAMINOPHEN 7.5-325 MG PO TABS: 1.0000 | ORAL_TABLET | ORAL | 0 refills | Status: DC | PRN

## 2020-12-25 ENCOUNTER — Other Ambulatory Visit: Payer: Self-pay | Admitting: Family Medicine

## 2020-12-31 ENCOUNTER — Other Ambulatory Visit: Payer: Self-pay | Admitting: Family Medicine

## 2021-01-03 ENCOUNTER — Telehealth: Payer: 59 | Admitting: Psychiatry

## 2021-01-03 MED ORDER — OXYCODONE-ACETAMINOPHEN 7.5-325 MG PO TABS: 1.0000 | ORAL_TABLET | ORAL | 0 refills | Status: DC | PRN

## 2021-01-11 ENCOUNTER — Telehealth: Payer: Self-pay

## 2021-01-11 ENCOUNTER — Other Ambulatory Visit: Payer: Self-pay | Admitting: Family Medicine

## 2021-01-11 NOTE — Telephone Encounter (Signed)
Returned call to patient to discuss Viibryd co-pay options.  Advised patient to go into Viibryd savings program to see if it is affordable.  Patient will check and call back tomorrow morning.  Will also check if we can provide him samples.

## 2021-01-11 NOTE — Telephone Encounter (Signed)
Was able to contact pharmacy and use a saving card.  It went threw so the cost went down to $15.   Pt was contacted and given the information and also told that the pharmacy stated that they had to order the medication but it should come in tomorrow.   Also let patient know that I would mail the saving card and also a patient assistant form to fil out.

## 2021-01-11 NOTE — Telephone Encounter (Signed)
pt called states that viibryd has went generic and that his insurance will not cover because it is not on their formulary and if the pharamcy runs it as name brand it cost $312.  can you change medication to something more affordable.

## 2021-01-15 ENCOUNTER — Ambulatory Visit (INDEPENDENT_AMBULATORY_CARE_PROVIDER_SITE_OTHER): Payer: 59 | Admitting: Licensed Clinical Social Worker

## 2021-01-15 ENCOUNTER — Other Ambulatory Visit: Payer: Self-pay

## 2021-01-15 DIAGNOSIS — F330 Major depressive disorder, recurrent, mild: Secondary | ICD-10-CM

## 2021-01-15 MED ORDER — OXYCODONE-ACETAMINOPHEN 7.5-325 MG PO TABS: 1.0000 | ORAL_TABLET | ORAL | 0 refills | Status: DC | PRN

## 2021-01-15 NOTE — Progress Notes (Signed)
Virtual Visit via Video Note  I connected with Raymond Rose on 01/15/21 at  8:00 AM EDT by a video enabled telemedicine application and verified that I am speaking with the correct person using two identifiers.  Location: Patient: home Provider: remote office [place], [place])   I discussed the limitations of evaluation and management by telemedicine and the availability of in person appointments. The patient expressed understanding and agreed to proceed.   I discussed the assessment and treatment plan with the patient. The patient was provided an opportunity to ask questions and all were answered. The patient agreed with the plan and demonstrated an understanding of the instructions.   The patient was advised to call back or seek an in-person evaluation if the symptoms worsen or if the condition fails to improve as anticipated.  I provided 45 minutes of non-face-to-face time during this encounter.   Raymond Zafar R Emma Schupp, LCSW   THERAPIST PROGRESS NOTE  Session Time: 8/8:45a  Participation Level: Active  Behavioral Response: Neat and Well GroomedAlertDepressed  Type of Therapy: Individual Therapy  Treatment Goals addressed: Diagnosis: depression  Interventions: CBT and Reframing  Summary: Raymond Rose is a 45 y.o. male who presents with continuing symptoms related to depression diagnosis. Pt reports that mood was lower last week, but he is feeling better at time of session. Patient reports that he still has some stress over several job rejections. Patient said that he was offered an alternative job, but when he sent his papers and information to HR the offer was rescinded. Patient states that he just feels very dismissed and that he is not given the proper feedback about why he is not being offered positions because he is aware that he is qualified and has experience. Allow patient to discuss overall mood, and patient reports that he was feeling significantly depressed  last week, and that his wife took a week off of work just to support him. Patient states that his wife had planned lots of events, but due to him not wanting to engage in events they stayed at home for most of the time period patient reports that his wife feels very helpless, and that she cried a lot during the week. Patient reports he is trying to adhere to a daily structure and schedule. He is getting up around the same time each day, eating around the same time each day, and trying to get out of the house. Patient states he is trying hard to let go of things that he can't control. 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 Patient states that he is looking forward to upcoming golf tournament and hopes that his health does not take a turn for the worst prior to the golf tournament. Patient states that this has happened in the past, so he is fearful about it happening again. Reviewed local crisis resources, suicidal ideational red flags, and encourage patient to dial 988 for immediate assistance. Discussed possible need for intensive outpatient treatment, and patient is not interested. Clinician will continue working with Raymond Rose, psychiatrist, and any family members for a "next steps" meeting re: progress and treatment planning. Continued recommendations are as follows: self care behaviors, positive social engagements, focusing on overall work/home/life balance, and focusing on positive physical and emotional wellness.   Suicidal/Homicidal: No  Therapist Response: Raymond Rose is continuing to utilize behavioral strategies to overcome depression. Raymond Rose is aware of how important it is for him to  implement a regular exercise regimen as a depression reduction technique. Raymond Rose is able to engage in recreational activities that reflect increased energy and interest. Raymond Rose has an improved level of self awareness and is setting positive goals for the future.  Raymond Rose has been recommended for higher level of treatment (intensive outpatient therapy) but pt is refusing to engage with it. Pt prefers to stay with psychiatric med management and individual counseling. Progress intermittent/fluctuating. Treatment to continue.   Plan: Return again in 4 weeks.  Diagnosis: Axis I: MDD, recurrent    Axis II: No diagnosis    [place], LCSW 01/15/2021

## 2021-01-16 ENCOUNTER — Other Ambulatory Visit: Payer: Self-pay | Admitting: Family Medicine

## 2021-01-16 ENCOUNTER — Other Ambulatory Visit: Payer: Self-pay | Admitting: Psychiatry

## 2021-01-16 DIAGNOSIS — F330 Major depressive disorder, recurrent, mild: Secondary | ICD-10-CM

## 2021-01-16 DIAGNOSIS — F331 Major depressive disorder, recurrent, moderate: Secondary | ICD-10-CM

## 2021-01-23 ENCOUNTER — Encounter: Payer: Self-pay | Admitting: Psychiatry

## 2021-01-23 ENCOUNTER — Other Ambulatory Visit: Payer: Self-pay

## 2021-01-23 ENCOUNTER — Telehealth (INDEPENDENT_AMBULATORY_CARE_PROVIDER_SITE_OTHER): Payer: 59 | Admitting: Psychiatry

## 2021-01-23 DIAGNOSIS — F330 Major depressive disorder, recurrent, mild: Secondary | ICD-10-CM | POA: Diagnosis not present

## 2021-01-23 DIAGNOSIS — F4010 Social phobia, unspecified: Secondary | ICD-10-CM

## 2021-01-23 DIAGNOSIS — F5105 Insomnia due to other mental disorder: Secondary | ICD-10-CM | POA: Diagnosis not present

## 2021-01-23 DIAGNOSIS — F411 Generalized anxiety disorder: Secondary | ICD-10-CM | POA: Diagnosis not present

## 2021-01-23 MED ORDER — ESZOPICLONE 3 MG PO TABS: 3.0000 mg | ORAL_TABLET | Freq: Every day | ORAL | 1 refills | Status: DC

## 2021-01-23 NOTE — Progress Notes (Signed)
Virtual Visit via Video Note  I connected with Raymond Rose on 01/23/21 at  3:00 PM EDT by a video enabled telemedicine application and verified that I am speaking with the correct person using two identifiers. Location Provider Location : [facility] Patient Location : Home  Participants: Patient , Provider    I discussed the limitations of evaluation and management by telemedicine and the availability of in person appointments. The patient expressed understanding and agreed to proceed.    I discussed the assessment and treatment plan with the patient. The patient was provided an opportunity to ask questions and all were answered. The patient agreed with the plan and demonstrated an understanding of the instructions.   The patient was advised to call back or seek an in-person evaluation if the symptoms worsen or if the condition fails to improve as anticipated.   [place] MD OP Progress Note  01/24/2021 3:42 PM Raymond Rose  MRN:  [id number]  Chief Complaint:  Chief Complaint   Follow-up; Depression; Anxiety    HPI: Raymond Rose is a 45 year old Caucasian male, married, lives in [place], has a history of MDD, GAD, social anxiety disorder, sleep apnea, cyclical vomiting, diabetes mellitus, erectile dysfunction was evaluated by telemedicine today.  Patient is currently likely COVID-19 positive, his wife tested positive few days ago.  He does have symptoms of sinus congestion, fatigue.  He has not tested himself yet.  Patient reports last week he had a hard time with his depression since he could not get a job in spite of going for a few interviews.  That took him to a very dark place.  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  He however went for a golf tour with his family and reports he had a good weekend.  He currently denies any active suicidal thoughts or plan.  He reports he does  have upcoming interview tomorrow with another potential job opportunity.  He looks forward to that.  However reports he is kind of not very hopeful about getting a job due to all the past interview failures that he had.  He continues to follow-up with his therapist.  He reports his wife continues to be a good support system.  He agrees to talk to his wife if he has any suicidality.  He could also go to the nearest urgent care and he is aware of that.  Is compliant on medications.  Denies side effects.  He continues to take prochlorperazine for his nausea and reports his cyclical vomiting is currently stable and that makes him feel better.  He feels he will be able to stay at a job if he were to get a new opportunity.  Patient reports sleep problems.  He reports he has been taking the [facility] as prescribed however going to sleep late, has difficulty falling asleep and has been sleeping too late morning or noon.  Patient denies any other concerns today.   Visit Diagnosis:    ICD-10-CM   1. MDD (major depressive disorder), recurrent episode, mild ([place])  F33.0     2. GAD (generalized anxiety disorder)  F41.1     3. Social anxiety disorder  F40.10     4. Insomnia due to mental disorder  F51.05 Raymond Rose   depression      Past Psychiatric History: Reviewed past psychiatric history from progress note on 04/21/2019.  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, 3cm adrenal mass on left   CT scan of Brain  12/23/2011    without contrast, [facility], Normal   TONSILLECTOMY  1985    Family Psychiatric History: Reviewed family psychiatric history from progress note on 04/21/2019  Family History:  Family History  Problem Relation Age of Onset   Depression Mother    Heart attack Father 70   Arthritis Other    Alcohol abuse Other    Lung cancer Other    Hyperlipidemia Other    CAD Other    Stroke Other    Hypertension Other    Bipolar disorder Other     Social History: Reviewed social history from progress note on 04/21/2019 Social History   Socioeconomic History   Marital status: Married    Spouse name: Not on file   Number of children: Not on file   Years of education: Not on file   Highest education level: Not on file  Occupational History   Occupation: Banking    Comment: [employer]  Tobacco Use   Smoking status: Former    Years: 5.00    Types: Cigarettes    Quit date: 06/17/1997    Years since quitting: 23.6   Smokeless tobacco: Never   Tobacco comments:    started smoking at [age]he 16, and quit at age 47  Vaping Use   Vaping Use: Never used  Substance and Sexual Activity   Alcohol use: Yes    Alcohol/week: 0.0 standard drinks    Comment: occasional alcohol use   Drug use: No   Sexual activity: Not on file  Other Topics Concern   Not on file  Social History Narrative   Not on file   Social Determinants of Health   Financial Resource Strain: Not on file  Food Insecurity: Not on file  Transportation Needs: Not on file  Physical Activity: Not on file  Stress: Not on file  Social Connections: Not on file    Allergies:  Allergies  Allergen Reactions   Bupropion     Extreme anxiety   Morphine     Metabolic Disorder Labs: Lab Results  Component Value Date   HGBA1C 9.5 (A) 10/03/2020   MPG 243.17 01/23/2020   No results found for: PROLACTIN Lab Results  Component Value Date   CHOL 140 10/03/2020   TRIG 259 (H) 10/03/2020   HDL 37 (L) 10/03/2020   CHOLHDL 3.8 10/03/2020    LDLCALC 62 10/03/2020   LDLCALC 42 05/24/2019   Lab Results  Component Value Date   TSH 1.590 12/04/2020   TSH 2.160 10/03/2020    Therapeutic Level Labs: Lab Results  Component Value Date   LITHIUM 0.6 12/04/2020   LITHIUM 0.37 (L) 09/08/2020   No results found for: VALPROATE No components found for:  CBMZ  Current Medications: Current Outpatient Medications  Medication Sig Dispense Refill   Raymond Rose Take 1  tablet (3 mg total) by mouth at bedtime. Take immediately before bedtime 30 tablet 1   ALPRAZolam (XANAX) 0.5 MG tablet Take 1 tablet (0.5 mg total) by mouth every 4 (four) hours as needed for anxiety. 90 tablet 1   BD PEN NEEDLE NANO 2ND GEN 32G X 4 MM MISC USE AS DIRECTED 4 TIMES A DAY 100 each 3   blood glucose meter kit and supplies KIT Dispense based on patient and insurance preference. Use up to four times daily as directed. (FOR ICD-9 250.00, 250.01). 1 each 0   Continuous Blood Gluc Sensor (FREESTYLE LIBRE 14 DAY SENSOR) MISC SMARTSIG:1 Each Topical Every 2 Weeks     Dulaglutide 3 MG/0.5ML SOPN Inject into the skin.     fluconazole (DIFLUCAN) 150 MG tablet TAKE 1 TABLET BY MOUTH AS ONE DOSE 1 tablet 3   hydrOXYzine (VISTARIL) 50 MG capsule TAKE 1 TO 2 CAPSULES BY MOUTH AT BEDTIME AS NEEDED FOR SLEEP 60 capsule 1   insulin aspart (NOVOLOG FLEXPEN) 100 UNIT/ML FlexPen Inject up to 10 units three times daily as directed by physician 15 mL 5   ketoconazole (NIZORAL) 2 % cream APPLY TO AFFECTED AREA ONCE DAILY FOR 7 DAYS AS NEEDED (Patient taking differently: Apply 1 application topically daily. APPLY TO AFFECTED AREA ONCE DAILY FOR 7 DAYS AS NEEDED) 15 g 3   lamoTRIgine (LAMICTAL) 100 MG tablet Take 1 tablet (100 mg total) by mouth daily. 90 tablet 0   LEVEMIR FLEXTOUCH 100 UNIT/ML FlexPen Inject 28 Units into the skin daily. 15 mL 5   lithium carbonate 300 MG capsule Take 1-2 capsules (300-600 mg total) by mouth as directed. START TAKING 1 CAP DAILY AM AND 2  CAPS DAILY PM 90 capsule 1   metFORMIN (GLUCOPHAGE-XR) 500 MG 24 hr tablet TAKE 2 TABLETS BY MOUTH TWICE A DAY 120 tablet 3   omeprazole (PRILOSEC) 40 MG capsule TAKE 1 CAPSULE BY MOUTH EVERY DAY 90 capsule 4   oxyCODONE-acetaminophen (PERCOCET) 7.5-325 MG tablet Take 1 tablet by mouth every 4 (four) hours as needed. 60 tablet 0   prazosin (MINIPRESS) 2 MG capsule Take 1 capsule (2 mg total) by mouth at bedtime. 30 capsule 1   prochlorperazine (COMPAZINE) 10 MG tablet TAKE 0.5-1 TABLETS (5-10 MG TOTAL) BY MOUTH EVERY 6 (SIX) HOURS AS NEEDED FOR NAUSEA OR VOMITING. 30 tablet 3   rosuvastatin (CRESTOR) 20 MG tablet TAKE 1 TABLET BY MOUTH EVERY DAY 90 tablet 2   sildenafil (VIAGRA) 50 MG tablet Take 1-2 tablets (50-100 mg total) by mouth daily as needed. 90 tablet 3   tamsulosin (FLOMAX) 0.4 MG CAPS capsule Take 0.4 mg by mouth daily.     TRULICITY 1.5 MG/0.5ML SOPN SMARTSIG:0.5 Milliliter(s) SUB-Q Once a Week     Vilazodone HCl (VIIBRYD) 40 MG Rose Take 1 tablet (40 mg total) by mouth daily. 90 tablet 0   No current facility-administered medications for this visit.     Musculoskeletal: Strength & Muscle Tone:  UTA Gait & Station:  UTA Patient leans: N/A  Psychiatric Specialty Exam: Review of Systems  HENT:  Positive for sinus pressure.   Psychiatric/Behavioral:  Positive for dysphoric mood and sleep disturbance.   All other systems reviewed and are negative.  There were no vitals taken for this visit.There is no height or weight on file to calculate BMI.  General Appearance: Casual  Eye Contact:  Fair  Speech:  Clear and Coherent  Volume:  Normal  Mood:  Dysphoric improving  Affect:  Congruent  Thought Process:  Goal Directed and Descriptions of Associations: Intact  Orientation:  Full (Time, Place, and Person)  Thought Content: Logical   Suicidal Thoughts:  No  Homicidal Thoughts:  No  Memory:  Immediate;   Fair Recent;   Fair Remote;   Fair  Judgement:  Fair  Insight:  Fair   Psychomotor Activity:  Normal  Concentration:  Concentration: Fair and Attention Span: Fair  Recall:  [employer] of Knowledge: Fair  Language: Fair  Akathisia:  No  Handed:  Right  AIMS (if indicated): not done  Assets:  Communication Skills Desire for Improvement Housing Social Support  ADL's:  Intact  Cognition: WNL  Sleep:  Poor   Screenings: GAD-7    Flowsheet Row Video Visit from 10/18/2020 in [place] from 03/10/2020 in [place] Visit from 04/16/2019 in [place]  Total GAD-7 Score $RemoveBef'6 13 20      'QKUtseaswA$ PHQ2-9    [place] Video Visit from 01/23/2021 in [place] from 01/15/2021 in [place] Visit from 12/01/2020 in [facility] Video Visit from 11/16/2020 in [place] Video Visit from 10/18/2020 in [place]  PHQ-2 Total Score $RemoveBef'2 4 2 1 'TjiCWqJeut$ 0  PHQ-9 Total Score $RemoveBef'12 18 11 9 4      'kofBMPniWA$ Flowsheet Row Video Visit from 01/23/2021 in [place] Counselor from 01/15/2021 in [place] from 12/07/2020 in [place] Low Risk Low Risk No Risk        Assessment and Plan: Raymond Rose is a 45 year old Caucasian male, employed, lives in [place], has a history of MDD, OSA on CPAP, diabetes mellitus, arthritis was evaluated by telemedicine today.  Patient is biologically predisposed given history of family mental health problems.  Patient with psychosocial stressors of medical problems like cyclical vomiting, inability to find a job.  Patient currently struggles with sleep , denies any active suicidality at this time.  Does have good social support system from his wife. The patient demonstrates the following risk factors for suicide:  Chronic risk factors for suicide include: psychiatric disorder of MDD . Acute risk factors for suicide include: unemployment. Protective factors for this patient include: positive social support, positive therapeutic relationship, and Agrees to go to ED or get help  . Considering these factors, the overall suicide risk at this point appears to be low. Patient is appropriate for outpatient follow up.   Plan MDD-improving Viibryd 40 mg p.o. daily Lithium 900 mg p.o. daily Lamictal at reduced dosage of 100 mg p.o. daily   GAD-improving Continue CBT with Ms. Christina Hussami Xanax as prescribed per primary care provider Hydroxyzine 50 to 100 mg p.o. nightly  Social anxiety disorder-improving Continue CBT  Insomnia-unstable Increase Lunesta to 3 mg p.o. nightly Prazosin 2 mg p.o. nightly Patient will need treatment for sinus congestion, advised to follow-up with primary care provider for possible exposure to COVID-19 .  High risk medication use-reviewed and discussed lithium level-0.6-therapeutic, TSH-1.590-within normal limits, BUN/creatinine-within normal limits-dated 12/04/2020.  We will coordinate care with Ms. Christina Hussami  This note was generated in part or whole with voice recognition software. Voice recognition is usually quite accurate but there are transcription errors that can and very often do occur. I apologize for any typographical errors that were not detected and corrected.  Ursula Alert, MD 01/24/2021, 3:42 PM

## 2021-01-29 ENCOUNTER — Other Ambulatory Visit: Payer: Self-pay | Admitting: Family Medicine

## 2021-01-29 DIAGNOSIS — E119 Type 2 diabetes mellitus without complications: Secondary | ICD-10-CM

## 2021-01-29 NOTE — Telephone Encounter (Signed)
Medication Refill - Medication: Oxycodone 7.5/325  Has the patient contacted their pharmacy? No. (Agent: If no, request that the patient contact the pharmacy for the refill.) (Agent: If yes, when and what did the pharmacy advise?)  Preferred Pharmacy (with phone number or street name): [facility]  [place]  Agent: Please be advised that RX refills may take up to 3 business days. We ask that you follow-up with your pharmacy.

## 2021-01-29 NOTE — Telephone Encounter (Signed)
Requested medication (s) are due for refill today:  yes  Requested medication (s) are on the active medication list:  yes   Last refill:  01/11/2021  Future visit scheduled: no  Notes to clinic:  this refill cannot be delegated    Requested Prescriptions  Pending Prescriptions Disp Refills   oxyCODONE-acetaminophen (PERCOCET) 7.5-325 MG tablet 60 tablet 0    Sig: Take 1 tablet by mouth every 4 (four) hours as needed.     Not Delegated - Analgesics:  Opioid Agonist Combinations Failed - 01/29/2021  2:02 PM      Failed - This refill cannot be delegated      Failed - Urine Drug Screen completed in last 360 days      Passed - Valid encounter within last 6 months    Recent Outpatient Visits           1 month ago Tremor   [facility] Birdie Sons, MD   3 months ago Type 2 diabetes mellitus with ketoacidosis without coma, without long-term current use of insulin [facility])   [facility] Birdie Sons, MD   6 months ago Cough   [facility] Carles Collet M, [place]   9 months ago Chronic hip pain, bilateral   [facility] Birdie Sons, MD   11 months ago Type 2 diabetes mellitus with ketoacidosis without coma, without long-term current use of insulin [facility])   [facility] Birdie Sons, MD

## 2021-01-30 MED ORDER — OXYCODONE-ACETAMINOPHEN 7.5-325 MG PO TABS: 1.0000 | ORAL_TABLET | ORAL | 0 refills | Status: DC | PRN

## 2021-02-03 ENCOUNTER — Other Ambulatory Visit: Payer: Self-pay | Admitting: Family Medicine

## 2021-02-03 DIAGNOSIS — E1110 Type 2 diabetes mellitus with ketoacidosis without coma: Secondary | ICD-10-CM

## 2021-02-03 NOTE — Telephone Encounter (Signed)
Requested Prescriptions  Pending Prescriptions Disp Refills  . BD PEN NEEDLE NANO 2ND GEN 32G X 4 MM MISC [Pharmacy Med Name: BD NANO 2 GEN PEN NDL 32GX4MM] 100 each 3    Sig: USE AS DIRECTED 4 TIMES A DAY     Endocrinology: Diabetes - Testing Supplies Passed - 02/03/2021  2:14 PM      Passed - Valid encounter within last 12 months    Recent Outpatient Visits          2 months ago Tremor   [facility] Birdie Sons, MD   4 months ago Type 2 diabetes mellitus with ketoacidosis without coma, without long-term current use of insulin [facility])   [facility] Birdie Sons, MD   6 months ago Cough   [facility] Carles Collet M, [place]   9 months ago Chronic hip pain, bilateral   [facility] Birdie Sons, MD   12 months ago Type 2 diabetes mellitus with ketoacidosis without coma, without long-term current use of insulin [facility])   [facility] Birdie Sons, MD

## 2021-02-07 ENCOUNTER — Other Ambulatory Visit: Payer: Self-pay | Admitting: Family Medicine

## 2021-02-08 ENCOUNTER — Ambulatory Visit: Payer: 59 | Admitting: Psychiatry

## 2021-02-11 ENCOUNTER — Other Ambulatory Visit: Payer: Self-pay | Admitting: Family Medicine

## 2021-02-13 ENCOUNTER — Other Ambulatory Visit: Payer: Self-pay

## 2021-02-13 ENCOUNTER — Ambulatory Visit (INDEPENDENT_AMBULATORY_CARE_PROVIDER_SITE_OTHER): Payer: 59 | Admitting: Licensed Clinical Social Worker

## 2021-02-13 DIAGNOSIS — F411 Generalized anxiety disorder: Secondary | ICD-10-CM | POA: Diagnosis not present

## 2021-02-13 DIAGNOSIS — F330 Major depressive disorder, recurrent, mild: Secondary | ICD-10-CM

## 2021-02-13 NOTE — Progress Notes (Signed)
Virtual Visit via Video Note  I connected with Raymond Rose on 02/13/21 at  8:00 AM EDT by a video enabled telemedicine application and verified that I am speaking with the correct person using two identifiers.  Location: Patient: home  Provider: remote office [place], [place])   I discussed the limitations of evaluation and management by telemedicine and the availability of in person appointments. The patient expressed understanding and agreed to proceed.  I discussed the assessment and treatment plan with the patient. The patient was provided an opportunity to ask questions and all were answered. The patient agreed with the plan and demonstrated an understanding of the instructions.   The patient was advised to call back or seek an in-person evaluation if the symptoms worsen or if the condition fails to improve as anticipated.  I provided 23 minutes of non-face-to-face time during this encounter.   [place], LCSW   THERAPIST PROGRESS NOTE  Session Time: 8-8:23a  Participation Level: Active  Behavioral Response: Neat and Well GroomedAlertEuthymic  Type of Therapy: Individual Therapy  Treatment Goals addressed: Coping  Interventions: Supportive  Summary: Raymond Rose is a 45 y.o. male who presents with improvement in overall depression symptoms. Patient reports that overall mood has been stable, and that he is managing situational stressors and life events will. Allowed patient safe space to explore and express thoughts and feelings associated with recent external stressors in life events. Patient reports that he is happy to have received a job offer, and accepted the job. Patient reports that he has been working for one week and is finishing up his training. Patient reports that this relieves a lot of stress for self and wife. Discussed patients overall health, and patient feels like now that he has a job he has the motivation too make better health related  choices. Patient elected to end session early due to work hours.Continued recommendations are as follows: self care behaviors, positive social engagements, focusing on overall work/home/life balance, and focusing on positive physical and emotional wellness.  .   Suicidal/Homicidal: No  Therapist Response: Raymond Rose is continuing to utilize behavioral strategies to overcome depression. Raymond Rose is aware of how important it is for him to implement a regular exercise regimen as a depression reduction technique. Raymond Rose is able to engage in recreational activities that reflect increased energy and interest. Raymond Rose has an improved level of self awareness and is setting positive goals for the future.  These behaviors are reflective of both personal growth and progress. Treatment to continue as indicated.  Plan: Return again in 4 weeks.  Diagnosis: Axis I: MDD, recurrent, mild; GAD    Axis II: No diagnosis    [place], LCSW 02/13/2021

## 2021-02-16 ENCOUNTER — Telehealth: Payer: Self-pay | Admitting: Family Medicine

## 2021-02-16 ENCOUNTER — Other Ambulatory Visit: Payer: Self-pay | Admitting: Family Medicine

## 2021-02-16 NOTE — Telephone Encounter (Signed)
This patient is over due for follow up of meds and diabetes and needs to schedule this month in order to approve refill.

## 2021-02-19 ENCOUNTER — Other Ambulatory Visit: Payer: Self-pay | Admitting: Psychiatry

## 2021-02-19 ENCOUNTER — Other Ambulatory Visit: Payer: Self-pay | Admitting: Family Medicine

## 2021-02-19 DIAGNOSIS — F330 Major depressive disorder, recurrent, mild: Secondary | ICD-10-CM

## 2021-02-19 DIAGNOSIS — F419 Anxiety disorder, unspecified: Secondary | ICD-10-CM

## 2021-02-20 ENCOUNTER — Other Ambulatory Visit: Payer: Self-pay

## 2021-02-20 ENCOUNTER — Encounter: Payer: Self-pay | Admitting: Family Medicine

## 2021-02-20 NOTE — Telephone Encounter (Signed)
See MyChart message.     Thanks,   -Mickel Baas

## 2021-02-20 NOTE — Telephone Encounter (Signed)
See MyChart message.  Pt advised.   Thanks,   -Mickel Baas

## 2021-02-20 NOTE — Telephone Encounter (Signed)
He is overdue for follow up visit for medications and diabetes. He needs to be schedule for appt before any refills can be approved.

## 2021-02-20 NOTE — Telephone Encounter (Signed)
Last refill: 12/17/2020, # 90 with 1 refill Next office visit: none scheduled Last office visit:12/01/2020

## 2021-02-21 ENCOUNTER — Other Ambulatory Visit: Payer: Self-pay

## 2021-02-21 NOTE — Telephone Encounter (Signed)
I called and spoke with patient. He states he tested positive for COVID last night. He used a home test that was provided by the government through the mail. Patient started having symptoms last night around 8:30pm. Symptoms include fever of 101 and sore throat. Patient has a new job that requires a doctors note to be out of work. Patient is willing to do a virtual visit, but there are no available appointments today. Please advise on note.  Patient also mentioned that he needs a refill on his pain medication. He has been out for 10 days. I advised him that he was overdue for a follow up appointment. Patient is willing to schedule a follow up appointment for the next available time slot. Patient wants to know if he could get a refill to get by until his next appointment. Please advise.

## 2021-02-21 NOTE — Telephone Encounter (Signed)
Copied from [place] [id number]. Topic: General - Other >> Feb 21, 2021  8:22 AM Leward Quan A wrote: Reason for CRM: Patient called in to inform Dr Caryn Section that he tested positive for Covid with a home test and need a note for work. Asking for the note to be loaded to My Chart.   No appointments available please advise Ph# [phone]

## 2021-02-22 MED ORDER — OXYCODONE-ACETAMINOPHEN 7.5-325 MG PO TABS: 1.0000 | ORAL_TABLET | ORAL | 0 refills | Status: DC | PRN

## 2021-02-22 NOTE — Addendum Note (Signed)
Addended by: Birdie Sons on: 02/22/2021 07:49 AM   Modules accepted: Orders

## 2021-02-22 NOTE — Telephone Encounter (Signed)
He can have note. Have sent prescription oxycodone. He needs to be seen in office for follow up before any future refills. Recommend prescription paxlovid for Covid. Can send prescription to his pharmacy if he agrees.

## 2021-02-22 NOTE — Telephone Encounter (Signed)
Patient states that he is needing a note for return to work from Dr Caryn Section. And state that he is willing to do a virtual visit with another provider if he has to since Dr Caryn Section has no availabilities. Asking for a call back today please  Ph# [phone]

## 2021-02-23 MED ORDER — NIRMATRELVIR/RITONAVIR (PAXLOVID)TABLET: 3.0000 | ORAL_TABLET | Freq: Two times a day (BID) | ORAL | 0 refills | Status: AC

## 2021-02-23 NOTE — Telephone Encounter (Signed)
Pt called back stating he will be going back on Monday 09/12. Please advise.

## 2021-02-23 NOTE — Telephone Encounter (Signed)
Tried calling;  Pt's voicemail is full.  PEC please find out when he is going back to work.     Thanks,   -Mickel Baas

## 2021-02-23 NOTE — Telephone Encounter (Signed)
Letter has been sent to pt's MyChart.  Thanks,   -Mickel Baas

## 2021-02-23 NOTE — Addendum Note (Signed)
Addended by: Ashley Royalty E on: 02/23/2021 03:25 PM   Modules accepted: Orders

## 2021-02-26 ENCOUNTER — Other Ambulatory Visit: Payer: Self-pay

## 2021-02-26 ENCOUNTER — Telehealth (HOSPITAL_BASED_OUTPATIENT_CLINIC_OR_DEPARTMENT_OTHER): Payer: Self-pay | Admitting: Psychiatry

## 2021-02-26 ENCOUNTER — Other Ambulatory Visit: Payer: Self-pay | Admitting: Family Medicine

## 2021-02-26 DIAGNOSIS — E119 Type 2 diabetes mellitus without complications: Secondary | ICD-10-CM

## 2021-02-26 DIAGNOSIS — F330 Major depressive disorder, recurrent, mild: Secondary | ICD-10-CM

## 2021-02-26 NOTE — Progress Notes (Signed)
  Patient was driving when Probation officer contacted for this appointment.  Patient advised it was not safe to drive and do an evaluation at the same time.  Patient was advised to either park the car to continue the evaluation or call back and reschedule.  Patient agrees to reschedule this appointment.

## 2021-03-02 ENCOUNTER — Other Ambulatory Visit: Payer: Self-pay | Admitting: Family Medicine

## 2021-03-05 ENCOUNTER — Other Ambulatory Visit: Payer: Self-pay

## 2021-03-05 ENCOUNTER — Encounter: Payer: Self-pay | Admitting: Family Medicine

## 2021-03-05 ENCOUNTER — Ambulatory Visit (INDEPENDENT_AMBULATORY_CARE_PROVIDER_SITE_OTHER): Payer: BC Managed Care – PPO | Admitting: Family Medicine

## 2021-03-05 VITALS — BP 117/77 | HR 85 | Temp 98.7°F | Wt 339.0 lb

## 2021-03-05 DIAGNOSIS — M545 Low back pain, unspecified: Secondary | ICD-10-CM

## 2021-03-05 DIAGNOSIS — G8929 Other chronic pain: Secondary | ICD-10-CM

## 2021-03-05 DIAGNOSIS — E1165 Type 2 diabetes mellitus with hyperglycemia: Secondary | ICD-10-CM

## 2021-03-05 DIAGNOSIS — R197 Diarrhea, unspecified: Secondary | ICD-10-CM

## 2021-03-05 DIAGNOSIS — Z23 Encounter for immunization: Secondary | ICD-10-CM | POA: Diagnosis not present

## 2021-03-05 DIAGNOSIS — R112 Nausea with vomiting, unspecified: Secondary | ICD-10-CM

## 2021-03-05 DIAGNOSIS — E66813 Obesity, class 3: Secondary | ICD-10-CM

## 2021-03-05 LAB — POCT GLYCOSYLATED HEMOGLOBIN (HGB A1C): Hemoglobin A1C: 10.9 % — AB (ref 4.0–5.6)

## 2021-03-05 LAB — POCT UA - MICROALBUMIN: Microalbumin Ur, POC: 20 mg/L

## 2021-03-05 MED ORDER — LEVEMIR FLEXTOUCH 100 UNIT/ML ~~LOC~~ SOPN: 38.0000 [IU] | PEN_INJECTOR | Freq: Every day | SUBCUTANEOUS | Status: DC

## 2021-03-05 MED ORDER — OXYCODONE-ACETAMINOPHEN 7.5-325 MG PO TABS: 1.0000 | ORAL_TABLET | ORAL | 0 refills | Status: DC | PRN

## 2021-03-05 NOTE — Patient Instructions (Signed)
Please review the attached list of medications and notify my office if there are any errors.   Increase the Levemir to 38 units every day

## 2021-03-05 NOTE — Progress Notes (Signed)
Established patient visit   Patient: Raymond Rose   DOB: 1975-09-23   45 y.o. Male  MRN: [id number] Visit Date: 03/05/2021  Today's healthcare provider: [name], MD   Chief Complaint  Patient presents with   Diabetes   Subjective    HPI  Diabetes Mellitus Type II, Follow-up  Lab Results  Component Value Date   HGBA1C 9.5 (A) 10/03/2020   HGBA1C 10.1 (H) 01/23/2020   HGBA1C 9.7 (A) 01/10/2020   Wt Readings from Last 3 Encounters:  03/05/21 (!) 339 lb (153.8 kg)  12/01/20 (!) 327 lb 12.8 oz (148.7 kg)  11/20/20 (!) 330 lb (149.7 kg)   Last seen for diabetes 5 months ago.   He reports excellent compliance with treatment. He is not having side effects.  Symptoms: No fatigue No foot ulcerations  No appetite changes Yes nausea  Yes paresthesia of the feet  Yes polydipsia  Yes polyuria No visual disturbances   Yes vomiting     Home blood sugar records: Pt states he has not been checking his blood sugar much recently, but when he does it is always high  Episodes of hypoglycemia? No    Current insulin regiment: Levermir 32-34 units daily, Novolog 20 units two times a day. He was being managed by Warnell Forester but doesn't want to return. She had him up to 3 mg Trulicity weekly. He has chronic daily nausea and vomiting, but this did not improve at all when GLP-1 was previously discontinue and he doesn't feel it has gotten any worse on Trulicity or with up titiration.  Most Recent Eye Exam: ? Current exercise: Plays Golf Current diet habits: in general, a "healthy" diet    Pertinent Labs: Lab Results  Component Value Date   CHOL 140 10/03/2020   HDL 37 (L) 10/03/2020   LDLCALC 62 10/03/2020   TRIG 259 (H) 10/03/2020   CHOLHDL 3.8 10/03/2020   Lab Results  Component Value Date   NA 135 12/04/2020   K 4.7 12/04/2020   CREATININE 0.76 12/04/2020   GFRNONAA >60 11/21/2020   GFRAA 131 08/10/2020   GLUCOSE 290 (H) 12/04/2020      --------------------------------------------------------------------------------------------------- He continue con oxycodone/apap on schedule for daily back pain which remains effective, but pain is disabling when he runs out.     Medications: Outpatient Medications Prior to Visit  Medication Sig   ALPRAZolam (XANAX) 0.5 MG tablet Take 1 tablet (0.5 mg total) by mouth every 4 (four) hours as needed for anxiety.   BD PEN NEEDLE NANO 2ND GEN 32G X 4 MM MISC USE AS DIRECTED 4 TIMES A DAY   blood glucose meter kit and supplies KIT Dispense based on patient and insurance preference. Use up to four times daily as directed. (FOR ICD-9 250.00, 250.01).   Continuous Blood Gluc Sensor (FREESTYLE LIBRE 14 DAY SENSOR) MISC SMARTSIG:1 Each Topical Every 2 Weeks   Dulaglutide 3 MG/0.5ML SOPN Inject into the skin.   Eszopiclone 3 MG TABS Take 1 tablet (3 mg total) by mouth at bedtime. Take immediately before bedtime   fluconazole (DIFLUCAN) 150 MG tablet TAKE 1 TABLET BY MOUTH AS ONE DOSE   hydrOXYzine (VISTARIL) 50 MG capsule TAKE 1 TO 2 CAPSULES BY MOUTH AT BEDTIME AS NEEDED FOR SLEEP   insulin aspart (NOVOLOG FLEXPEN) 100 UNIT/ML FlexPen Inject up to 10 units three times daily as directed by physician (Patient taking differently: 20 Units twice a day)   ketoconazole (NIZORAL) 2 % cream  APPLY TO AFFECTED AREA ONCE DAILY FOR 7 DAYS AS NEEDED (Patient taking differently: Apply 1 application topically daily. APPLY TO AFFECTED AREA ONCE DAILY FOR 7 DAYS AS NEEDED)   lamoTRIgine (LAMICTAL) 100 MG tablet TAKE 1 TABLET BY MOUTH EVERY DAY   LEVEMIR FLEXTOUCH 100 UNIT/ML FlexPen Inject 28 Units into the skin daily. (Patient taking differently: Inject 32-34 Units into the skin daily.)   lithium carbonate 300 MG capsule Take 1-2 capsules (300-600 mg total) by mouth as directed. START TAKING 1 CAP DAILY AM AND 2 CAPS DAILY PM   metFORMIN (GLUCOPHAGE-XR) 500 MG 24 hr tablet TAKE 2 TABLETS BY MOUTH TWICE A DAY    omeprazole (PRILOSEC) 40 MG capsule TAKE 1 CAPSULE BY MOUTH EVERY DAY   oxyCODONE-acetaminophen (PERCOCET) 7.5-325 MG tablet Take 1 tablet by mouth every 4 (four) hours as needed. PATIENT NEEDS TO SCHEDULE OFFICE VISIT FOR FOLLOW UP   prazosin (MINIPRESS) 2 MG capsule Take 1 capsule (2 mg total) by mouth at bedtime.   prochlorperazine (COMPAZINE) 10 MG tablet TAKE 0.5-1 TABLETS (5-10 MG TOTAL) BY MOUTH EVERY 6 (SIX) HOURS AS NEEDED FOR NAUSEA OR VOMITING.   rosuvastatin (CRESTOR) 20 MG tablet TAKE 1 TABLET BY MOUTH EVERY DAY   sildenafil (VIAGRA) 50 MG tablet Take 1-2 tablets (50-100 mg total) by mouth daily as needed.   tamsulosin (FLOMAX) 0.4 MG CAPS capsule Take 0.4 mg by mouth daily.   TRULICITY 1.5 VO/1.6WV SOPN SMARTSIG:0.5 Milliliter(s) SUB-Q Once a Week   Vilazodone HCl (VIIBRYD) 40 MG TABS Take 1 tablet (40 mg total) by mouth daily.   No facility-administered medications prior to visit.    Review of Systems  Constitutional:  Negative for appetite change, chills and fever.  Respiratory:  Negative for chest tightness, shortness of breath and wheezing.   Cardiovascular:  Negative for chest pain and palpitations.  Gastrointestinal:  Positive for nausea and vomiting. Negative for abdominal pain, blood in stool and constipation.       Objective    BP 117/77 (BP Location: Right Arm, Patient Position: Sitting, Cuff Size: Large)   Pulse 85   Temp 98.7 F (37.1 C) (Oral)   Wt (!) 339 lb (153.8 kg)   SpO2 97%   BMI 44.73 kg/m    Physical Exam   General: Appearance:    Severely obese male in no acute distress  Eyes:    PERRL, conjunctiva/corneas clear, EOM's intact       Lungs:     Clear to auscultation bilaterally, respirations unlabored  Heart:    Normal heart rate. Normal rhythm. No murmurs, rubs, or gallops.    MS:   All extremities are intact.    Neurologic:   Awake, alert, oriented x 3. No apparent focal neurological defect.         Results for orders placed or performed  in visit on 03/05/21  POCT UA - Microalbumin  Result Value Ref Range   Microalbumin Ur, POC 20 mg/L  POCT glycosylated hemoglobin (Hb A1C)  Result Value Ref Range   Hemoglobin A1C 10.9 (A) 4.0 - 5.6 %    Assessment & Plan     1. Uncontrolled type 2 diabetes mellitus with hyperglycemia ([place]) No longer seeing Endocrinology. Trulicity does not seem to have had any affect on his chronic nausea dn vomiting. Will increase basal insulin from 23 units to  LEVEMIR FLEXTOUCH 100 UNIT/ML FlexTouch Pen; Inject 38 Units into the skin daily.  2. Chronic low back pain, unspecified back pain laterality,  unspecified whether sciatica present Controlled on current pain medication regiment.   3. Obesity, Class III, BMI 40-49.9 (morbid obesity) ([place]) He is anticipating getting back on low carb diet soon.   4. Need for influenza vaccination  - Flu Vaccine QUAD 6+ mos PF IM (Fluarix Quad PF)  5. Nausea vomiting and diarrhea Chronic idiopathic, but has not been effected by up titration or previous discontinuation of GLP1 agonist  Future Appointments  Date Time Provider [place]  03/15/2021  8:00 AM Hussami, Rachel Bo, LCSW [facility] None  04/18/2021  8:00 AM Jatavia Keltner, Kirstie Peri, MD [facility]        The entirety of the information documented in the History of Present Illness, Review of Systems and Physical Exam were personally obtained by me. Portions of this information were initially documented by the CMA and reviewed by me for thoroughness and accuracy.     [name], MD  [facility] [phone] (phone) [phone] (fax)  [place]

## 2021-03-08 ENCOUNTER — Other Ambulatory Visit: Payer: Self-pay | Admitting: Family Medicine

## 2021-03-14 ENCOUNTER — Encounter: Payer: Self-pay | Admitting: Psychiatry

## 2021-03-14 ENCOUNTER — Telehealth (INDEPENDENT_AMBULATORY_CARE_PROVIDER_SITE_OTHER): Payer: Self-pay | Admitting: Psychiatry

## 2021-03-14 ENCOUNTER — Other Ambulatory Visit: Payer: Self-pay

## 2021-03-14 DIAGNOSIS — F3342 Major depressive disorder, recurrent, in full remission: Secondary | ICD-10-CM

## 2021-03-14 DIAGNOSIS — F4010 Social phobia, unspecified: Secondary | ICD-10-CM

## 2021-03-14 DIAGNOSIS — F5105 Insomnia due to other mental disorder: Secondary | ICD-10-CM

## 2021-03-14 DIAGNOSIS — Z79899 Other long term (current) drug therapy: Secondary | ICD-10-CM

## 2021-03-14 DIAGNOSIS — F411 Generalized anxiety disorder: Secondary | ICD-10-CM

## 2021-03-14 MED ORDER — VILAZODONE HCL 40 MG PO TABS: 40.0000 mg | ORAL_TABLET | Freq: Every day | ORAL | 0 refills | Status: DC

## 2021-03-14 MED ORDER — ESZOPICLONE 3 MG PO TABS: 3.0000 mg | ORAL_TABLET | Freq: Every day | ORAL | 1 refills | Status: DC

## 2021-03-14 MED ORDER — LITHIUM CARBONATE 300 MG PO CAPS: 300.0000 mg | ORAL_CAPSULE | ORAL | 1 refills | Status: DC

## 2021-03-14 NOTE — Progress Notes (Signed)
Virtual Visit via Video Note  I connected with Raymond Rose on 03/14/21 at  2:30 PM EDT by a video enabled telemedicine application and verified that I am speaking with the correct person using two identifiers.  Location Provider Location : [facility] Patient Location : Work  Participants: Patient , Provider   I discussed the limitations of evaluation and management by telemedicine and the availability of in person appointments. The patient expressed understanding and agreed to proceed.   I discussed the assessment and treatment plan with the patient. The patient was provided an opportunity to ask questions and all were answered. The patient agreed with the plan and demonstrated an understanding of the instructions.   The patient was advised to call back or seek an in-person evaluation if the symptoms worsen or if the condition fails to improve as anticipated.    [place] MD OP Progress Note  03/14/2021 2:55 PM [place]  MRN:  [id number]  Chief Complaint:  Chief Complaint   Follow-up; Anxiety; Depression    HPI: Raymond Rose is a 45 year old Caucasian male, married, lives in [place], has a history of MDD, GAD, social anxiety disorder, sleep apnea, cyclical vomiting, diabetes mellitus, erectile dysfunction was evaluated by telemedicine today.  Patient today reports he is currently at this new job with [employer], started this new job 5 weeks ago.  It is very laid back and he likes it so far.  He works Monday through Friday.  Since starting this new job his depression has improved.  He does report passive suicidal thoughts-which are chronic-thoughts like 'why am I here, what is the purpose and so on'.  However denies any active plan, active suicidality at this time.  Patient reports he ran out of his Lamictal a few days ago.  He hence started taking 150 mg which was from a previous prescription.  Patient otherwise has been compliant with medications.  Denies side  effects.  Patient denies any suicidality, homicidality or perceptual disturbances.  Patient denies any other concerns today.  Visit Diagnosis:    ICD-10-CM   1. MDD (major depressive disorder), recurrent, in full remission ([place])  F33.42 lithium carbonate 300 MG capsule    Lithium level    2. GAD (generalized anxiety disorder)  F41.1 Vilazodone HCl (VIIBRYD) 40 MG TABS    3. Social anxiety disorder  F40.10     4. Insomnia due to mental disorder  F51.05 Eszopiclone 3 MG TABS   depression    5. High risk medication use  Z79.899 Lithium level      Past Psychiatric History: Reviewed past psychiatric history from progress note on 04/21/2019.  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, 3cm adrenal mass on left   CT scan of Brain  12/23/2011   without contrast, [facility], Normal   TONSILLECTOMY  1985    Family Psychiatric History: I have reviewed family psychiatric history from progress note on 04/21/2019  Family History:  Family History  Problem Relation Age of Onset   Depression Mother    Heart attack Father 17   Arthritis Other    Alcohol abuse Other    Lung cancer Other    Hyperlipidemia Other    CAD Other    Stroke Other    Hypertension Other    Bipolar disorder Other     Social History: Reviewed social history from progress note on 04/21/2019 Social History   Socioeconomic History   Marital status: Married    Spouse name: Not on file   Number of children: Not on file   Years of education: Not on file    Highest education level: Not on file  Occupational History   Occupation: Banking    Comment: [employer]  Tobacco Use   Smoking status: Former    Years: 5.00    Types: Cigarettes    Quit date: 06/17/1997    Years since quitting: 23.7   Smokeless tobacco: Never   Tobacco comments:    started smoking at [age]he 16, and quit at age 19  Vaping Use   Vaping Use: Never used  Substance and Sexual Activity   Alcohol use: Yes    Alcohol/week: 0.0 standard drinks    Comment: occasional alcohol use   Drug use: No   Sexual activity: Not on file  Other Topics Concern   Not on file  Social History Narrative   Not on file   Social Determinants of Health   Financial Resource Strain: Not on file  Food Insecurity: Not on file  Transportation Needs: Not on file  Physical Activity: Not on file  Stress: Not on file  Social Connections: Not on file    Allergies:  Allergies  Allergen Reactions   Bupropion     Extreme anxiety   Morphine     Metabolic Disorder Labs: Lab Results  Component Value Date   HGBA1C 10.9 (A) 03/05/2021   MPG 243.17 01/23/2020   No results found for: PROLACTIN Lab Results  Component Value Date   CHOL 140 10/03/2020   TRIG 259 (H) 10/03/2020   HDL 37 (L) 10/03/2020   CHOLHDL 3.8 10/03/2020   LDLCALC 62 10/03/2020   LDLCALC 42 05/24/2019   Lab Results  Component Value Date   TSH 1.590 12/04/2020   TSH 2.160 10/03/2020    Therapeutic Level Labs: Lab Results  Component Value Date   LITHIUM 0.6 12/04/2020   LITHIUM 0.37 (L) 09/08/2020   No results found for: VALPROATE No components found for:  CBMZ  Current Medications: Current Outpatient Medications  Medication Sig Dispense Refill   ALPRAZolam (XANAX) 0.5 MG tablet Take 1 tablet (0.5 mg total) by mouth every 4 (four) hours as needed for anxiety. 90 tablet 0   BD PEN NEEDLE NANO 2ND GEN 32G X 4 MM MISC USE AS DIRECTED 4 TIMES A DAY 100 each 3   blood glucose meter kit and supplies KIT Dispense  based on patient and insurance preference. Use up to four times daily as directed. (FOR ICD-9 250.00, 250.01). 1 each 0   Continuous Blood Gluc Sensor (FREESTYLE LIBRE 14 DAY SENSOR) MISC SMARTSIG:1 Each Topical Every 2 Weeks     Dulaglutide 3 MG/0.5ML SOPN Inject into the skin.     Eszopiclone 3 MG TABS Take 1 tablet (3 mg total) by mouth at bedtime. Take immediately before bedtime 30 tablet 1   fluconazole (DIFLUCAN) 150 MG tablet TAKE 1 TABLET BY MOUTH AS ONE DOSE 1 tablet 3  hydrOXYzine (VISTARIL) 50 MG capsule TAKE 1 TO 2 CAPSULES BY MOUTH AT BEDTIME AS NEEDED FOR SLEEP 60 capsule 1   insulin aspart (NOVOLOG FLEXPEN) 100 UNIT/ML FlexPen Inject up to 10 units three times daily as directed by physician (Patient taking differently: 20 Units twice a day) 15 mL 5   ketoconazole (NIZORAL) 2 % cream APPLY TO AFFECTED AREA ONCE DAILY FOR 7 DAYS AS NEEDED (Patient taking differently: Apply 1 application topically daily. APPLY TO AFFECTED AREA ONCE DAILY FOR 7 DAYS AS NEEDED) 15 g 3   lamoTRIgine (LAMICTAL) 100 MG tablet TAKE 1 TABLET BY MOUTH EVERY DAY 90 tablet 0   LEVEMIR FLEXTOUCH 100 UNIT/ML FlexTouch Pen Inject 38 Units into the skin daily.     lithium carbonate 300 MG capsule Take 1-2 capsules (300-600 mg total) by mouth as directed. START TAKING 1 CAP DAILY AM AND 2 CAPS DAILY PM 90 capsule 1   metFORMIN (GLUCOPHAGE-XR) 500 MG 24 hr tablet TAKE 2 TABLETS BY MOUTH TWICE A DAY 40 tablet 0   omeprazole (PRILOSEC) 40 MG capsule TAKE 1 CAPSULE BY MOUTH EVERY DAY 90 capsule 4   oxyCODONE-acetaminophen (PERCOCET) 7.5-325 MG tablet Take 1 tablet by mouth every 4 (four) hours as needed. PATIENT NEEDS TO SCHEDULE OFFICE VISIT FOR FOLLOW UP 60 tablet 0   prazosin (MINIPRESS) 2 MG capsule Take 1 capsule (2 mg total) by mouth at bedtime. 30 capsule 1   prochlorperazine (COMPAZINE) 10 MG tablet TAKE 0.5-1 TABLETS (5-10 MG TOTAL) BY MOUTH EVERY 6 (SIX) HOURS AS NEEDED FOR NAUSEA OR VOMITING. 30 tablet 3    rosuvastatin (CRESTOR) 20 MG tablet TAKE 1 TABLET BY MOUTH EVERY DAY 90 tablet 2   sildenafil (VIAGRA) 50 MG tablet Take 1-2 tablets (50-100 mg total) by mouth daily as needed. 90 tablet 3   tamsulosin (FLOMAX) 0.4 MG CAPS capsule Take 0.4 mg by mouth daily.     TRULICITY 1.5 XT/0.2IO SOPN SMARTSIG:0.5 Milliliter(s) SUB-Q Once a Week     Vilazodone HCl (VIIBRYD) 40 MG TABS Take 1 tablet (40 mg total) by mouth daily. 90 tablet 0   No current facility-administered medications for this visit.     Musculoskeletal: Strength & Muscle Tone:  UTA Gait & Station: normal Patient leans: N/A  Psychiatric Specialty Exam: Review of Systems  Psychiatric/Behavioral:  The patient is nervous/anxious.   All other systems reviewed and are negative.  There were no vitals taken for this visit.There is no height or weight on file to calculate BMI.  General Appearance: Casual  Eye Contact:  Fair  Speech:  Clear and Coherent  Volume:  Normal  Mood:  Anxious, coping well  Affect:  Congruent  Thought Process:  Goal Directed and Descriptions of Associations: Intact  Orientation:  Full (Time, Place, and Person)  Thought Content: Logical   Suicidal Thoughts:  No  Homicidal Thoughts:  No  Memory:  Immediate;   Good Recent;   Fair Remote;   Fair  Judgement:  Fair  Insight:  Fair  Psychomotor Activity:  Normal  Concentration:  Concentration: Fair and Attention Span: Fair  Recall:  [employer] of Knowledge: Fair  Language: Fair  Akathisia:  No  Handed:  Right  AIMS (if indicated): done  Assets:  Communication Skills Desire for Improvement Housing Social Support Transportation Vocational/Educational  ADL's:  Intact  Cognition: WNL  Sleep:  Fair   Screenings: GAD-7    Flowsheet Row Video Visit from 03/14/2021 in [place] Video Visit  from 10/18/2020 in [place] from 03/10/2020 in [place]  Visit from 04/16/2019 in [facility]  Total GAD-7 Score $RemoveBef'2 6 13 20      'toiJYzDGre$ PHQ2-9    Flowsheet Row Video Visit from 03/14/2021 in [place] Office Visit from 03/05/2021 in [facility] Video Visit from 01/23/2021 in [place] from 01/15/2021 in [place] from 12/01/2020 in [place]  PHQ-2 Total Score 0 0 $R'2 4 2  'Ka$ PHQ-9 Total Score $RemoveBef'1 2 12 18 11      'ABvJTJLyHa$ Flowsheet Row Video Visit from 03/14/2021 in [place] Video Visit from 01/23/2021 in [place] from 01/15/2021 in [place] Low Risk Low Risk Low Risk        Assessment and Plan: Zadrian Mccauley Garcilazo is a 45 year old Caucasian male, [place], [place], has a history of MDD, OSA on CPAP, diabetes mellitus, arthritis was evaluated by telemedicine today.  Patient is biologically predisposed given history of family mental health problems.  Patient with psychosocial stressors of medical problems like cyclical vomiting, job related stressors currently is making progress on the current medication regimen.  Plan as noted below.  Plan MDD-in remission Viibryd 40 mg p.o. daily Lithium 900 mg p.o. daily Lamictal at reduced dose 100 mg p.o. daily.  Discussed with patient Lamictal prescription was sent to the pharmacy on 02/19/2021, receipt confirmed by pharmacy per EHR.  Patient agrees to contact pharmacy.  GAD-improving Continue CBT with Ms. Christina Hussami Xanax as prescribed per primary care provider. Hydroxyzine 50-100 mg manage nightly  Social anxiety disorder-improving Continue CBT  Insomnia-improving Lunesta 3 mg p.o. nightly Prazosin 2 mg p.o. nightly  High risk medication use-will order lithium level.  Patient to go to lab at [facility] in October to get this  done.  Patient with chronic suicidal thoughts, currently denies any active suicidal thoughts or plan.  Acute risk for suicide is low.  Patient however will continue to benefit from psychotherapy sessions.  Discussed with patient that his next appointment will be in person.  Patient agrees with plan to come into the office for his next visit.  Follow-up in 2 months or sooner as needed.  This note was generated in part or whole with voice recognition software. Voice recognition is usually quite accurate but there are transcription errors that can and very often do occur. I apologize for any typographical errors that were not detected and corrected.    Ursula Alert, MD 03/15/2021, 12:49 PM

## 2021-03-15 ENCOUNTER — Other Ambulatory Visit: Payer: Self-pay | Admitting: Family Medicine

## 2021-03-15 ENCOUNTER — Ambulatory Visit: Payer: 59 | Admitting: Licensed Clinical Social Worker

## 2021-03-16 ENCOUNTER — Telehealth: Payer: Self-pay

## 2021-03-16 DIAGNOSIS — F331 Major depressive disorder, recurrent, moderate: Secondary | ICD-10-CM

## 2021-03-16 MED ORDER — HYDROXYZINE PAMOATE 50 MG PO CAPS: ORAL_CAPSULE | ORAL | 1 refills | Status: DC

## 2021-03-16 MED ORDER — OXYCODONE-ACETAMINOPHEN 7.5-325 MG PO TABS: 1.0000 | ORAL_TABLET | ORAL | 0 refills | Status: DC | PRN

## 2021-03-16 NOTE — Telephone Encounter (Signed)
I have sent hydroxyzine to pharmacy. 

## 2021-03-16 NOTE — Telephone Encounter (Signed)
received fax requesting a refill on the hydroxyzine

## 2021-03-23 ENCOUNTER — Other Ambulatory Visit: Payer: Self-pay | Admitting: Family Medicine

## 2021-03-23 DIAGNOSIS — E1165 Type 2 diabetes mellitus with hyperglycemia: Secondary | ICD-10-CM

## 2021-03-23 MED ORDER — LEVEMIR FLEXTOUCH 100 UNIT/ML ~~LOC~~ SOPN: 38.0000 [IU] | PEN_INJECTOR | Freq: Every day | SUBCUTANEOUS | Status: DC

## 2021-03-23 MED ORDER — NOVOLOG FLEXPEN 100 UNIT/ML ~~LOC~~ SOPN: PEN_INJECTOR | SUBCUTANEOUS | 5 refills | Status: DC

## 2021-03-23 NOTE — Telephone Encounter (Signed)
Medication Refill - Medication:  TRULICITY 1.5 RE/3.2WQ SOPN  insulin aspart (NOVOLOG FLEXPEN) 100 UNIT/ML FlexPen LEVEMIR FLEXTOUCH 100 UNIT/ML FlexTouch Pen    Has the patient contacted their pharmacy? Yes.   Contact pcp  Preferred Pharmacy (with phone number or street name):  [id number] [place], [place]  Phone:  [phone] Fax:  [phone]   Has the patient been seen for an appointment in the last year OR does the patient have an upcoming appointment? Yes.    Agent: Please be advised that RX refills may take up to 3 business days. We ask that you follow-up with your pharmacy.

## 2021-03-23 NOTE — Telephone Encounter (Signed)
Requested medications are due for refill today NO  Requested medications are on the active medication list yes  Last visit 03/05/21  Future visit scheduled 04/18/21  Notes to clinic Historical Provider, please assess.

## 2021-03-26 MED ORDER — DULAGLUTIDE 3 MG/0.5ML ~~LOC~~ SOAJ: 3.0000 mg | SUBCUTANEOUS | 6 refills | Status: DC

## 2021-03-26 NOTE — Addendum Note (Signed)
Addended by: Birdie Sons on: 03/26/2021 07:52 AM   Modules accepted: Orders

## 2021-03-27 ENCOUNTER — Other Ambulatory Visit: Payer: Self-pay | Admitting: Family Medicine

## 2021-03-27 DIAGNOSIS — E1165 Type 2 diabetes mellitus with hyperglycemia: Secondary | ICD-10-CM

## 2021-03-27 DIAGNOSIS — E119 Type 2 diabetes mellitus without complications: Secondary | ICD-10-CM

## 2021-03-27 NOTE — Telephone Encounter (Signed)
Please review pharmacy note: Pharmacy comment: Alternative Requested:DRUG NOT COVERED BY INSURANCE.

## 2021-03-28 ENCOUNTER — Other Ambulatory Visit: Payer: Self-pay

## 2021-03-28 MED ORDER — LEVEMIR FLEXTOUCH 100 UNIT/ML ~~LOC~~ SOPN
38.0000 [IU] | PEN_INJECTOR | Freq: Every day | SUBCUTANEOUS | 1 refills | Status: DC
  Filled 2021-03-28: qty 15, 39d supply, fill #0

## 2021-03-30 ENCOUNTER — Other Ambulatory Visit: Payer: Self-pay | Admitting: Family Medicine

## 2021-04-02 ENCOUNTER — Telehealth: Payer: Self-pay

## 2021-04-02 DIAGNOSIS — F5105 Insomnia due to other mental disorder: Secondary | ICD-10-CM

## 2021-04-02 MED ORDER — PRAZOSIN HCL 2 MG PO CAPS: 2.0000 mg | ORAL_CAPSULE | Freq: Every day | ORAL | 1 refills | Status: DC

## 2021-04-02 NOTE — Telephone Encounter (Signed)
LOV:  03/05/2021 NOV: 04/18/2021   Last Refill:  03/16/2021 #60 0 Refills.   Thanks,   Mickel Baas

## 2021-04-02 NOTE — Telephone Encounter (Signed)
received fax requesting refill on the prazosin

## 2021-04-02 NOTE — Telephone Encounter (Signed)
I have sent prazosin to pharmacy.

## 2021-04-03 MED ORDER — OXYCODONE-ACETAMINOPHEN 7.5-325 MG PO TABS: 1.0000 | ORAL_TABLET | ORAL | 0 refills | Status: DC | PRN

## 2021-04-03 NOTE — Telephone Encounter (Signed)
Requested medication (s) are due for refill today:   Provider to review  Requested medication (s) are on the active medication list:   Yes  Future visit scheduled:   Yes   Last ordered: 03/16/2021 #60, 0 refills  Non delegated refill   Requested Prescriptions  Pending Prescriptions Disp Refills   oxyCODONE-acetaminophen (PERCOCET) 7.5-325 MG tablet 60 tablet 0    Sig: Take 1 tablet by mouth every 4 (four) hours as needed.     Not Delegated - Analgesics:  Opioid Agonist Combinations Failed - 04/03/2021 11:22 AM      Failed - This refill cannot be delegated      Failed - Urine Drug Screen completed in last 360 days      Passed - Valid encounter within last 6 months    Recent Outpatient Visits           4 weeks ago Uncontrolled type 2 diabetes mellitus with hyperglycemia [facility])   [facility] Birdie Sons, MD   4 months ago Tremor   [facility] Birdie Sons, MD   6 months ago Type 2 diabetes mellitus with ketoacidosis without coma, without long-term current use of insulin [facility])   [facility] Birdie Sons, MD   8 months ago Cough   [place], [place]   11 months ago Chronic hip pain, bilateral   [facility] Birdie Sons, MD       Future Appointments             In 2 weeks Fisher, Kirstie Peri, MD [facility] [place]

## 2021-04-03 NOTE — Telephone Encounter (Signed)
LEVEMIR FLEXTOUCH 100 UNIT/ML FlexTouch Pen Was sent to the wrong pharmacy / [facility] [address] / pt also asked about refill for Oxycodone / that was requested as well but not sent / please advise

## 2021-04-05 ENCOUNTER — Telehealth: Payer: Self-pay

## 2021-04-05 ENCOUNTER — Other Ambulatory Visit: Payer: Self-pay

## 2021-04-05 NOTE — Telephone Encounter (Signed)
Copied from [place] [id number]. Topic: General - Other >> Apr 05, 2021 10:51 AM Holley Dexter N wrote: Reason for CRM: Pt called in wanting to get a Doc's Note pt states he has been out of work since Monday and wants to go back tomorrow 10/21, or if not next Monday 10/24. Pt requested a call back, please advise.

## 2021-04-06 NOTE — Telephone Encounter (Signed)
Patient is calling on Friday 04/06/21 to express displeasure that he has not received a call back regarding his letter out of work.  Pt had a flare up with cylical vomitting syndome on Monday and have been out of work all week.  Pt called to request a note for work on 04/05/21. Pt was advised today that an appt would be needed for the work note. Pts states that in the past that Dr. Caryn Section has written work notes with out appointments.  Pt is extremely disappointed that this the second issues that he has had in the last 2 month. The first issue was his medication was sent to the wrong pharmacy.  Pt is requesting regarding a callback regarding this issue on Monday weather an appt is needed or work note will be granted.  Please advise Cb- [phone]

## 2021-04-09 NOTE — Telephone Encounter (Signed)
Pt called upset as no one called last week he could have come in. If have to be seen could do virtual, called office Dr F not aware till 9:15 am pls fu with pt at [phone]

## 2021-04-11 NOTE — Telephone Encounter (Addendum)
Letter typed up and placed up front for pick up. Tried calling patient. No answer. Unable to leave message due to his mailbox being full. Will try calling again at a later time. Letter should also be available for patient via mychart. OK for [facility] to advise if patient returns call.

## 2021-04-11 NOTE — Telephone Encounter (Signed)
Confirmed with patient he was out of work form 10/17 until 10/21.  Return to work on 10/24.  Please call patient at [phone] when ready for pick up.   Thanks

## 2021-04-11 NOTE — Telephone Encounter (Signed)
That's fine. Please print work excused from 10/17 through 10/21 and return to work on 10/24. Thanks.

## 2021-04-13 NOTE — Telephone Encounter (Signed)
Tried calling patient. No answer. Unable to leave message due to voice mailbox full.

## 2021-04-15 ENCOUNTER — Other Ambulatory Visit: Payer: Self-pay | Admitting: Family Medicine

## 2021-04-15 DIAGNOSIS — F419 Anxiety disorder, unspecified: Secondary | ICD-10-CM

## 2021-04-15 NOTE — Telephone Encounter (Signed)
Requested medication (s) are due for refill today: alprazolam   Requested medication (s) are on the active medication list: yes  Last refill:  02/20/21 #90  Future visit scheduled: yes  Notes to clinic:  med not delegated to NT to RF  Prednisone last filled 12/29/20- not delegated to NT to RF Requested Prescriptions  Pending Prescriptions Disp Refills   ALPRAZolam (XANAX) 0.5 MG tablet [Pharmacy Med Name: ALPRAZOLAM 0.5 MG TABLET] 75 tablet 1    Sig: TAKE 1 TAB BY MOUTH EVERY 4 HRS AS NEEDED FOR ANXIETY. QTY/DAY SUPPLY PER INSURANCE.     Not Delegated - Psychiatry:  Anxiolytics/Hypnotics Failed - 04/15/2021  2:37 PM      Failed - This refill cannot be delegated      Failed - Urine Drug Screen completed in last 360 days      Passed - Valid encounter within last 6 months    Recent Outpatient Visits           1 month ago Uncontrolled type 2 diabetes mellitus with hyperglycemia [facility])   [facility] Birdie Sons, MD   4 months ago Tremor   [facility] Birdie Sons, MD   6 months ago Type 2 diabetes mellitus with ketoacidosis without coma, without long-term current use of insulin [facility])   [facility] Birdie Sons, MD   9 months ago Cough   [facility] Carles Collet M, [place]   12 months ago Chronic hip pain, bilateral   [place], [place]       Future Appointments             In 3 days Fisher, Kirstie Peri, MD [facility], [facility]             predniSONE (DELTASONE) 10 MG tablet [Pharmacy Med Name: PREDNISONE 10 MG TABLET] 42 tablet 0    Sig: TAKE 6 TABLETS FOR 2 DAYS, THEN 5 FOR 2 DAYS, THEN 4 FOR 2 DAYS, THEN 3 FOR 2 DAYS, THEN 2 FOR 2 DAYS, THEN 1 FOR 2 DAYS.     Not Delegated - Endocrinology:  Oral Corticosteroids Failed - 04/15/2021  2:37 PM      Failed - This refill cannot be delegated      Passed - Last BP in normal range    BP Readings from Last 1  Encounters:  03/05/21 117/77          Passed - Valid encounter within last 6 months    Recent Outpatient Visits           1 month ago Uncontrolled type 2 diabetes mellitus with hyperglycemia [facility])   [facility] Birdie Sons, MD   4 months ago [place], Donald E, MD   6 months ago Type 2 diabetes mellitus with ketoacidosis without coma, without long-term current use of insulin [facility])   [facility] Birdie Sons, MD   9 months ago Cough   [place], [place]   12 months ago Chronic hip pain, bilateral   [place], MD       Future Appointments             In 3 days Fisher, Kirstie Peri, MD [facility], [place]

## 2021-04-16 ENCOUNTER — Ambulatory Visit: Payer: Self-pay | Admitting: Licensed Clinical Social Worker

## 2021-04-16 ENCOUNTER — Other Ambulatory Visit: Payer: Self-pay | Admitting: Family Medicine

## 2021-04-16 ENCOUNTER — Other Ambulatory Visit: Payer: Self-pay

## 2021-04-16 ENCOUNTER — Encounter: Payer: Self-pay | Admitting: Family Medicine

## 2021-04-17 MED ORDER — OXYCODONE-ACETAMINOPHEN 7.5-325 MG PO TABS: 1.0000 | ORAL_TABLET | ORAL | 0 refills | Status: DC | PRN

## 2021-04-17 NOTE — Addendum Note (Signed)
Addended by: Ashley Royalty E on: 04/17/2021 01:38 PM   Modules accepted: Orders

## 2021-04-17 NOTE — Telephone Encounter (Signed)
Pt called stating that he rescheduled his appt for tomorrow for the next available with PCP in February. Requesting to have advice on how to get medication. Please advise.

## 2021-04-17 NOTE — Telephone Encounter (Signed)
He can have next available same day visit slot.

## 2021-04-17 NOTE — Telephone Encounter (Signed)
Apt made for 04/20/2021 at 11:20.  Pt needs a refill to get him through to his appointment.  Also he was just checking to see why he had to come in so soon.  He states he was here in September.    Thanks,   -Mickel Baas

## 2021-04-18 ENCOUNTER — Ambulatory Visit: Payer: BC Managed Care – PPO | Admitting: Family Medicine

## 2021-04-20 ENCOUNTER — Encounter: Payer: Self-pay | Admitting: Family Medicine

## 2021-04-20 ENCOUNTER — Ambulatory Visit (INDEPENDENT_AMBULATORY_CARE_PROVIDER_SITE_OTHER): Payer: BC Managed Care – PPO | Admitting: Family Medicine

## 2021-04-20 ENCOUNTER — Other Ambulatory Visit: Payer: Self-pay

## 2021-04-20 VITALS — BP 130/88 | HR 87 | Temp 98.7°F | Resp 18 | Wt 325.0 lb

## 2021-04-20 DIAGNOSIS — F3341 Major depressive disorder, recurrent, in partial remission: Secondary | ICD-10-CM

## 2021-04-20 DIAGNOSIS — G8929 Other chronic pain: Secondary | ICD-10-CM | POA: Diagnosis not present

## 2021-04-20 DIAGNOSIS — E1165 Type 2 diabetes mellitus with hyperglycemia: Secondary | ICD-10-CM | POA: Diagnosis not present

## 2021-04-20 DIAGNOSIS — M545 Low back pain, unspecified: Secondary | ICD-10-CM

## 2021-04-20 LAB — POCT GLYCOSYLATED HEMOGLOBIN (HGB A1C)
Est. average glucose Bld gHb Est-mCnc: 289
Hemoglobin A1C: 11.7 % — AB (ref 4.0–5.6)

## 2021-04-20 MED ORDER — LEVEMIR FLEXTOUCH 100 UNIT/ML ~~LOC~~ SOPN: 46.0000 [IU] | PEN_INJECTOR | Freq: Every day | SUBCUTANEOUS | Status: DC

## 2021-04-20 NOTE — Patient Instructions (Addendum)
Please review the attached list of medications and notify my office if there are any errors.   Increase Levemir to 46 units every day

## 2021-04-20 NOTE — Progress Notes (Signed)
Established patient visit   Patient: Raymond Rose   DOB: 1975/09/05   45 y.o. Male  MRN: [id number] Visit Date: 04/20/2021  Today's healthcare provider: [name], MD   Chief Complaint  Patient presents with   Diabetes   Pain Management   Subjective    HPI  Diabetes Mellitus Type II, Follow-up  Lab Results  Component Value Date   HGBA1C 10.9 (A) 03/05/2021   HGBA1C 9.5 (A) 10/03/2020   HGBA1C 10.1 (H) 01/23/2020   Wt Readings from Last 3 Encounters:  04/20/21 (!) 325 lb (147.4 kg)  03/05/21 (!) 339 lb (153.8 kg)  12/01/20 (!) 327 lb 12.8 oz (148.7 kg)   Last seen for diabetes on 03/05/2021.   Management since then includes increasing basal insulin from 23 units to  LEVEMIR FLEXTOUCH 100 UNIT/ML FlexTouch Pen; Inject 38 Units into the skin daily. He reports good compliance with treatment. He is not having side effects.  Symptoms: No fatigue No foot ulcerations  No appetite changes Yes nausea  No paresthesia of the feet  No polydipsia  No polyuria No visual disturbances   Yes vomiting     Home blood sugar records: fasting range: 250 or more  Episodes of hypoglycemia? No    Current insulin regiment: Levemir 38 units daily and Novolog Most Recent Eye Exam: 12/04/2020 Current exercise: none Current diet habits: in general, an "unhealthy" diet  Pertinent Labs: Lab Results  Component Value Date   CHOL 140 10/03/2020   HDL 37 (L) 10/03/2020   LDLCALC 62 10/03/2020   TRIG 259 (H) 10/03/2020   CHOLHDL 3.8 10/03/2020   Lab Results  Component Value Date   NA 135 12/04/2020   K 4.7 12/04/2020   CREATININE 0.76 12/04/2020   EGFR 115 12/04/2020   MICROALBUR 20 03/05/2021     ---------------------------------------------------------------------------------------------------      Medications: Outpatient Medications Prior to Visit  Medication Sig   ALPRAZolam (XANAX) 0.5 MG tablet TAKE 1 TAB BY MOUTH EVERY 4 HRS AS NEEDED FOR ANXIETY.  QTY/DAY SUPPLY PER INSURANCE.   BD PEN NEEDLE NANO 2ND GEN 32G X 4 MM MISC USE AS DIRECTED 4 TIMES A DAY   blood glucose meter kit and supplies KIT Dispense based on patient and insurance preference. Use up to four times daily as directed. (FOR ICD-9 250.00, 250.01).   Continuous Blood Gluc Sensor (FREESTYLE LIBRE 14 DAY SENSOR) MISC SMARTSIG:1 Each Topical Every 2 Weeks   Dulaglutide 3 MG/0.5ML SOPN Inject 3 mg into the skin once a week.   Eszopiclone 3 MG TABS Take 1 tablet (3 mg total) by mouth at bedtime. Take immediately before bedtime   fluconazole (DIFLUCAN) 150 MG tablet TAKE 1 TABLET BY MOUTH AS ONE DOSE   hydrOXYzine (VISTARIL) 50 MG capsule TAKE 1 TO 2 CAPSULES BY MOUTH AT BEDTIME AS NEEDED FOR SLEEP   insulin aspart (NOVOLOG FLEXPEN) 100 UNIT/ML FlexPen Inject up to 10 units three times daily as directed by physician   ketoconazole (NIZORAL) 2 % cream APPLY TO AFFECTED AREA ONCE DAILY FOR 7 DAYS AS NEEDED (Patient taking differently: Apply 1 application topically daily. APPLY TO AFFECTED AREA ONCE DAILY FOR 7 DAYS AS NEEDED)   lamoTRIgine (LAMICTAL) 100 MG tablet TAKE 1 TABLET BY MOUTH EVERY DAY   LEVEMIR FLEXTOUCH 100 UNIT/ML FlexTouch Pen Inject 38 Units into the skin daily.   lithium carbonate 300 MG capsule Take 1-2 capsules (300-600 mg total) by mouth as directed. START TAKING 1 CAP  DAILY AM AND 2 CAPS DAILY PM   metFORMIN (GLUCOPHAGE-XR) 500 MG 24 hr tablet TAKE 2 TABLETS BY MOUTH TWICE A DAY   omeprazole (PRILOSEC) 40 MG capsule TAKE 1 CAPSULE BY MOUTH EVERY DAY   oxyCODONE-acetaminophen (PERCOCET) 7.5-325 MG tablet Take 1 tablet by mouth every 4 (four) hours as needed.   prazosin (MINIPRESS) 2 MG capsule Take 1 capsule (2 mg total) by mouth at bedtime.   prochlorperazine (COMPAZINE) 10 MG tablet TAKE 0.5-1 TABLETS (5-10 MG TOTAL) BY MOUTH EVERY 6 (SIX) HOURS AS NEEDED FOR NAUSEA OR VOMITING.   rosuvastatin (CRESTOR) 20 MG tablet TAKE 1 TABLET BY MOUTH EVERY DAY   sildenafil  (VIAGRA) 50 MG tablet Take 1-2 tablets (50-100 mg total) by mouth daily as needed.   tamsulosin (FLOMAX) 0.4 MG CAPS capsule Take 0.4 mg by mouth daily.   Vilazodone HCl (VIIBRYD) 40 MG TABS Take 1 tablet (40 mg total) by mouth daily.   No facility-administered medications prior to visit.    Review of Systems  Constitutional:  Negative for appetite change, chills and fever.  Respiratory:  Negative for chest tightness, shortness of breath and wheezing.   Cardiovascular:  Negative for chest pain and palpitations.  Gastrointestinal:  Positive for nausea and vomiting. Negative for abdominal pain.      Objective    BP 130/88 (BP Location: Left Arm, Patient Position: Sitting, Cuff Size: Large)   Pulse 87   Temp 98.7 F (37.1 C) (Oral)   Resp 18   Wt (!) 325 lb (147.4 kg)   SpO2 98% Comment: room air  BMI 42.88 kg/m  {Show previous vital signs (optional):23777}  Physical Exam   General: Appearance:    Severely obese male in no acute distress  Eyes:    PERRL, conjunctiva/corneas clear, EOM's intact       Lungs:     Clear to auscultation bilaterally, respirations unlabored  Heart:    Normal heart rate. Normal rhythm. No murmurs, rubs, or gallops.    MS:   All extremities are intact.    Neurologic:   Awake, alert, oriented x 3. No apparent focal neurological defect.         Results for orders placed or performed in visit on 04/20/21  POCT HgB A1C  Result Value Ref Range   Hemoglobin A1C 11.7 (A) 4.0 - 5.6 %   Est. average glucose Bld gHb Est-mCnc 289     Assessment & Plan     1. Uncontrolled type 2 diabetes mellitus with hyperglycemia (HCC) Uncontrolled. He reports good compliance with medications. Will increase  LEVEMIR FLEXTOUCH 100 UNIT/ML FlexTouch Pen; to 46 Units into the skin daily.  2. MDD (major depressive disorder), recurrent, in partial remission ([place]) Continue follow up with psychiatry  3. Chronic low back pain, unspecified back pain laterality, unspecified  whether sciatica present Well controlled on current medications regiment. Refills have already been sent to his pharmacy.       The entirety of the information documented in the History of Present Illness, Review of Systems and Physical Exam were personally obtained by me. Portions of this information were initially documented by the CMA and reviewed by me for thoroughness and accuracy.     [name], MD  [facility] [phone] (phone) [phone] (fax)  [place]

## 2021-04-21 ENCOUNTER — Other Ambulatory Visit: Payer: Self-pay | Admitting: Family Medicine

## 2021-04-23 MED ORDER — OXYCODONE-ACETAMINOPHEN 7.5-325 MG PO TABS: 1.0000 | ORAL_TABLET | ORAL | 0 refills | Status: DC | PRN

## 2021-04-23 NOTE — Addendum Note (Signed)
Addended by: Ashley Royalty E on: 04/23/2021 03:57 PM   Modules accepted: Orders

## 2021-04-23 NOTE — Telephone Encounter (Signed)
Pt called stating that he usually gets a 10 day supply of this medication, but that he only got a 2-3 day supply. Pt requesting to have the full prescription sent in. Please advise.      [id number] [place], [place] - [address]  [place] [address]  Phone: [phone] Fax: [phone]  Hours: Not open 24 hours

## 2021-04-25 ENCOUNTER — Other Ambulatory Visit: Payer: Self-pay | Admitting: Family Medicine

## 2021-04-25 MED ORDER — OXYCODONE-ACETAMINOPHEN 7.5-325 MG PO TABS: 1.0000 | ORAL_TABLET | ORAL | 0 refills | Status: DC | PRN

## 2021-05-11 ENCOUNTER — Other Ambulatory Visit: Payer: Self-pay | Admitting: Family Medicine

## 2021-05-14 MED ORDER — OXYCODONE-ACETAMINOPHEN 7.5-325 MG PO TABS: 1.0000 | ORAL_TABLET | ORAL | 0 refills | Status: DC | PRN

## 2021-05-18 ENCOUNTER — Ambulatory Visit: Payer: Self-pay | Admitting: Psychiatry

## 2021-05-22 ENCOUNTER — Other Ambulatory Visit: Payer: Self-pay | Admitting: Psychiatry

## 2021-05-22 DIAGNOSIS — F331 Major depressive disorder, recurrent, moderate: Secondary | ICD-10-CM

## 2021-05-23 ENCOUNTER — Other Ambulatory Visit: Payer: Self-pay | Admitting: Family Medicine

## 2021-05-24 MED ORDER — OXYCODONE-ACETAMINOPHEN 7.5-325 MG PO TABS: 1.0000 | ORAL_TABLET | ORAL | 0 refills | Status: DC | PRN

## 2021-06-04 ENCOUNTER — Other Ambulatory Visit: Payer: Self-pay | Admitting: Family Medicine

## 2021-06-04 NOTE — Telephone Encounter (Signed)
LOV:  04/20/2021  NOV: 06/19/2021  Last Refill:  05/24/2021  #60 0 Refills.   Thanks,   -Mickel Baas

## 2021-06-05 MED ORDER — OXYCODONE-ACETAMINOPHEN 7.5-325 MG PO TABS: 1.0000 | ORAL_TABLET | ORAL | 0 refills | Status: DC | PRN

## 2021-06-15 ENCOUNTER — Other Ambulatory Visit: Payer: Self-pay | Admitting: Family Medicine

## 2021-06-15 MED ORDER — OXYCODONE-ACETAMINOPHEN 7.5-325 MG PO TABS: 1.0000 | ORAL_TABLET | ORAL | 0 refills | Status: DC | PRN

## 2021-06-19 ENCOUNTER — Ambulatory Visit (INDEPENDENT_AMBULATORY_CARE_PROVIDER_SITE_OTHER): Payer: 59 | Admitting: Family Medicine

## 2021-06-19 ENCOUNTER — Encounter: Payer: Self-pay | Admitting: Family Medicine

## 2021-06-19 ENCOUNTER — Other Ambulatory Visit: Payer: Self-pay

## 2021-06-19 VITALS — BP 125/70 | HR 86 | Temp 98.6°F | Resp 16 | Wt 319.0 lb

## 2021-06-19 DIAGNOSIS — E1165 Type 2 diabetes mellitus with hyperglycemia: Secondary | ICD-10-CM

## 2021-06-19 LAB — POCT GLYCOSYLATED HEMOGLOBIN (HGB A1C)
Est. average glucose Bld gHb Est-mCnc: >326
Hemoglobin A1C: 13.5 % — AB (ref 4.0–5.6)

## 2021-06-19 NOTE — Patient Instructions (Addendum)
Please review the attached list of medications and notify my office if there are any errors.   Toujeo is a basal insulin similar to Levemir. You can substitute Toujeo for Levemir and take the same dose.

## 2021-06-19 NOTE — Progress Notes (Signed)
Established patient visit   Patient: Raymond Rose   DOB: [birth year]   46 y.o. Male  MRN: [id number] Visit Date: 06/19/2021  Today's healthcare provider: [name], MD   Chief Complaint  Patient presents with   Diabetes   Subjective    HPI  Diabetes Mellitus Type II, Follow-up  Lab Results  Component Value Date   HGBA1C 13.5 (A) 06/19/2021   HGBA1C 11.7 (A) 04/20/2021   HGBA1C 10.9 (A) 03/05/2021   Wt Readings from Last 3 Encounters:  06/19/21 (!) 319 lb (144.7 kg)  04/20/21 (!) 325 lb (147.4 kg)  03/05/21 (!) 339 lb (153.8 kg)   Last seen for diabetes on 04/20/2021.   Management since then includes increasing LEVEMIR FLEXTOUCH 100 UNIT/ML FlexTouch Pen; to 46 Units into the skin daily. He reports fair compliance with treatment. (Patient has been taking insulin every other day to stretch the medication. He says the insulin wasn't covered by his insurance). He is not having side effects.  Symptoms: Yes fatigue No foot ulcerations  No appetite changes Yes nausea  No paresthesia of the feet  Yes polydipsia  Yes polyuria No visual disturbances   Yes vomiting     Home blood sugar records:  blood sugars are not checked  Episodes of hypoglycemia? No    Current insulin regiment: Levemir 46 units every other day Most Recent Eye Exam: 12/04/2020 Current exercise: none Current diet habits: in general, an "unhealthy" diet  Pertinent Labs: Lab Results  Component Value Date   CHOL 140 10/03/2020   HDL 37 (L) 10/03/2020   LDLCALC 62 10/03/2020   TRIG 259 (H) 10/03/2020   CHOLHDL 3.8 10/03/2020   Lab Results  Component Value Date   NA 135 12/04/2020   K 4.7 12/04/2020   CREATININE 0.76 12/04/2020   EGFR 115 12/04/2020   MICROALBUR 20 03/05/2021     ---------------------------------------------------------------------------------------------------   Medications: Outpatient Medications Prior to Visit  Medication Sig   ALPRAZolam (XANAX) 0.5 MG  tablet TAKE 1 TAB BY MOUTH EVERY 4 HRS AS NEEDED FOR ANXIETY. QTY/DAY SUPPLY PER INSURANCE.   BD PEN NEEDLE NANO 2ND GEN 32G X 4 MM MISC USE AS DIRECTED 4 TIMES A DAY   blood glucose meter kit and supplies KIT Dispense based on patient and insurance preference. Use up to four times daily as directed. (FOR ICD-9 250.00, 250.01).   Continuous Blood Gluc Sensor (FREESTYLE LIBRE 14 DAY SENSOR) MISC SMARTSIG:1 Each Topical Every 2 Weeks   Dulaglutide 3 MG/0.5ML SOPN Inject 3 mg into the skin once a week.   Eszopiclone 3 MG TABS Take 1 tablet (3 mg total) by mouth at bedtime. Take immediately before bedtime   hydrOXYzine (VISTARIL) 50 MG capsule TAKE 1 TO 2 CAPSULES BY MOUTH AT BEDTIME AS NEEDED FOR SLEEP   insulin aspart (NOVOLOG FLEXPEN) 100 UNIT/ML FlexPen Inject up to 10 units three times daily as directed by physician   ketoconazole (NIZORAL) 2 % cream APPLY TO AFFECTED AREA ONCE DAILY FOR 7 DAYS AS NEEDED (Patient taking differently: Apply 1 application topically daily. APPLY TO AFFECTED AREA ONCE DAILY FOR 7 DAYS AS NEEDED)   lamoTRIgine (LAMICTAL) 100 MG tablet TAKE 1 TABLET BY MOUTH EVERY DAY   LEVEMIR FLEXTOUCH 100 UNIT/ML FlexTouch Pen Inject 46 Units into the skin daily.   lithium carbonate 300 MG capsule Take 1-2 capsules (300-600 mg total) by mouth as directed. START TAKING 1 CAP DAILY AM AND 2 CAPS DAILY PM  metFORMIN (GLUCOPHAGE-XR) 500 MG 24 hr tablet TAKE 2 TABLETS BY MOUTH TWICE A DAY   omeprazole (PRILOSEC) 40 MG capsule TAKE 1 CAPSULE BY MOUTH EVERY DAY   oxyCODONE-acetaminophen (PERCOCET) 7.5-325 MG tablet Take 1 tablet by mouth every 4 (four) hours as needed.   prazosin (MINIPRESS) 2 MG capsule Take 1 capsule (2 mg total) by mouth at bedtime.   prochlorperazine (COMPAZINE) 10 MG tablet TAKE 0.5-1 TABLETS (5-10 MG TOTAL) BY MOUTH EVERY 6 (SIX) HOURS AS NEEDED FOR NAUSEA OR VOMITING.   rosuvastatin (CRESTOR) 20 MG tablet TAKE 1 TABLET BY MOUTH EVERY DAY   sildenafil (VIAGRA) 50 MG  tablet Take 1-2 tablets (50-100 mg total) by mouth daily as needed.   tamsulosin (FLOMAX) 0.4 MG CAPS capsule Take 0.4 mg by mouth daily.   Vilazodone HCl (VIIBRYD) 40 MG TABS Take 1 tablet (40 mg total) by mouth daily.   fluconazole (DIFLUCAN) 150 MG tablet TAKE 1 TABLET BY MOUTH AS ONE DOSE (Patient not taking: Reported on 06/19/2021)   No facility-administered medications prior to visit.    Review of Systems  Constitutional:  Positive for fatigue. Negative for appetite change, chills and fever.  Respiratory:  Negative for chest tightness, shortness of breath and wheezing.   Cardiovascular:  Negative for chest pain and palpitations.  Gastrointestinal:  Positive for nausea and vomiting. Negative for abdominal pain.  Endocrine: Positive for polydipsia and polyphagia.      Objective    BP 125/70 (BP Location: Left Arm, Patient Position: Sitting, Cuff Size: Normal)    Pulse 86    Temp 98.6 F (37 C) (Oral)    Resp 16    Wt (!) 319 lb (144.7 kg)    SpO2 97%    BMI 42.09 kg/m  {Show previous vital signs (optional):23777}  Physical Exam   General appearance: Severely obese male, cooperative and in no acute distress Head: Normocephalic, without obvious abnormality, atraumatic Respiratory: Respirations even and unlabored, normal respiratory rate Extremities: All extremities are intact.  Skin: Skin color, texture, turgor normal. No rashes seen  Psych: Appropriate mood and affect. Neurologic: Mental status: Alert, oriented to person, place, and time, thought content appropriate.   Results for orders placed or performed in visit on 06/19/21  POCT HgB A1C  Result Value Ref Range   Hemoglobin A1C 13.5 (A) 4.0 - 5.6 %   Est. average glucose Bld gHb Est-mCnc >326     Assessment & Plan     1. Uncontrolled type 2 diabetes mellitus with hyperglycemia (HCC) Poor compliance with insulin due to cost, is only taking a few days a week. Given 3 sample pens of Toujeo that he can substitute for  Levemi. He expects his new insurance will cover insulin better.   Future Appointments  Date Time Provider [place]  06/20/2021 11:00 AM Ursula Alert, MD [facility] None  08/22/2021  8:20 AM Caryn Section Kirstie Peri, MD [facility]           The entirety of the information documented in the History of Present Illness, Review of Systems and Physical Exam were personally obtained by me. Portions of this information were initially documented by the CMA and reviewed by me for thoroughness and accuracy.     [name], MD  [facility] [phone] (phone) [phone] (fax)  [place]

## 2021-06-20 ENCOUNTER — Ambulatory Visit (INDEPENDENT_AMBULATORY_CARE_PROVIDER_SITE_OTHER): Payer: 59 | Admitting: Psychiatry

## 2021-06-20 ENCOUNTER — Encounter: Payer: Self-pay | Admitting: Psychiatry

## 2021-06-20 ENCOUNTER — Other Ambulatory Visit: Payer: Self-pay

## 2021-06-20 VITALS — BP 148/88 | HR 44 | Temp 98.3°F | Wt 322.2 lb

## 2021-06-20 DIAGNOSIS — Z79899 Other long term (current) drug therapy: Secondary | ICD-10-CM

## 2021-06-20 DIAGNOSIS — F4010 Social phobia, unspecified: Secondary | ICD-10-CM

## 2021-06-20 DIAGNOSIS — F411 Generalized anxiety disorder: Secondary | ICD-10-CM | POA: Diagnosis not present

## 2021-06-20 DIAGNOSIS — Z91199 Patient's noncompliance with other medical treatment and regimen due to unspecified reason: Secondary | ICD-10-CM

## 2021-06-20 DIAGNOSIS — F5105 Insomnia due to other mental disorder: Secondary | ICD-10-CM

## 2021-06-20 DIAGNOSIS — F331 Major depressive disorder, recurrent, moderate: Secondary | ICD-10-CM | POA: Diagnosis not present

## 2021-06-20 MED ORDER — HYDROXYZINE PAMOATE 50 MG PO CAPS: 50.0000 mg | ORAL_CAPSULE | Freq: Every day | ORAL | 1 refills | Status: DC | PRN

## 2021-06-20 MED ORDER — ESZOPICLONE 3 MG PO TABS: 3.0000 mg | ORAL_TABLET | Freq: Every day | ORAL | 1 refills | Status: DC

## 2021-06-20 MED ORDER — LAMOTRIGINE 100 MG PO TABS: 150.0000 mg | ORAL_TABLET | Freq: Every day | ORAL | 1 refills | Status: DC

## 2021-06-20 MED ORDER — VILAZODONE HCL 40 MG PO TABS: 40.0000 mg | ORAL_TABLET | Freq: Every day | ORAL | 0 refills | Status: DC

## 2021-06-20 MED ORDER — PRAZOSIN HCL 2 MG PO CAPS: 2.0000 mg | ORAL_CAPSULE | Freq: Every day | ORAL | 1 refills | Status: DC

## 2021-06-20 MED ORDER — LITHIUM CARBONATE 300 MG PO CAPS: 300.0000 mg | ORAL_CAPSULE | ORAL | 1 refills | Status: DC

## 2021-06-20 NOTE — Progress Notes (Signed)
[name] Progress Note  06/20/2021 6:12 PM Raymond Rose  MRN:  [id number]  Chief Complaint:  Chief Complaint   Follow-up; Anxiety; Depression    HPI: Raymond Rose is a 46 year old Caucasian male, married, lives in [place], has a history of MDD, GAD, social anxiety disorder, sleep apnea, cyclical vomiting, diabetes mellitus, erectile dysfunction was evaluated in office today.  Patient's last appointment was on 03/14/2021.  Patient did not follow-up as recommended after his last visit.  Today returns reporting he had lost his job in October and hence had health insurance problem.  Patient reports that he stayed on some of his medications like his Viibryd, lithium and Lamictal.  He was able to get a 90-day supply of those medications which have helped him to stay compliant with those medications.  Patient however has been noncompliant with psychotherapy visits as well due to health insurance problem.  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  Patient does report being anxious about that.  Patient reports depressive symptoms like sadness, low energy, reduced appetite and so on.  Patient reports her depressive symptoms are also mostly because of his current situational stressors including cyclical vomiting which has not improved.  Patient does report feeling nervous, anxious about his upcoming first day at work, as well as his health problems.  He wonders whether he will be able to manage his work due to his medical issues.  Patient reports he is also trying to apply for disability for his health problems.  Patient with history of chronic suicidality, currently denies any active suicidal thoughts or plan.  Patient denies any side effects to his medications.  Does report some nightmares at night, that does have an impact on his sleep .  Patient does report that prazosin was beneficial in the past, we will consider readjusting the dosage in the  future.  Patient however reports he has been noncompliant with the Lunesta since he was not able to afford it without his health insurance.  Agreeable to restarting the Lunesta again for sleep.  Patient denies any other concerns today.    Visit Diagnosis:    ICD-10-CM   1. MDD (major depressive disorder), recurrent episode, moderate (HCC)  F33.1 Lithium level    TSH    BUN+Creat    hydrOXYzine (VISTARIL) 50 MG capsule    2. GAD (generalized anxiety disorder)  F41.1 Lithium level    Vilazodone HCl (VIIBRYD) 40 MG TABS    3. Social anxiety disorder  F40.10 lithium carbonate 300 MG capsule    4. Insomnia due to mental disorder  F51.05 prazosin (MINIPRESS) 2 MG capsule   mood    5. High risk medication use  Z79.899 lamoTRIgine (LAMICTAL) 100 MG tablet    6. Noncompliance with treatment regimen  Z91.199 Eszopiclone 3 MG TABS      Past Psychiatric History: Reviewed past psychiatric history from progress note on 04/21/2019.  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  Family History:  Family History  Problem Relation Age of Onset   Depression Mother    Heart attack Father 38   Arthritis Other    Alcohol abuse Other    Lung cancer Other    Hyperlipidemia Other    CAD Other    Stroke Other    Hypertension  Other    Bipolar disorder Other     Social History: Reviewed social history from progress note on 04/21/2019. Social History   Socioeconomic History   Marital status: Married    Spouse name: Not on file   Number of children: Not on file   Years of education: Not on file   Highest education level: Not on file  Occupational History   Occupation: Banking    Comment: [employer]  Tobacco Use   Smoking status: Former    Years: 5.00    Types: Cigarettes    Quit date: 06/17/1997    Years since quitting: 24.0   Smokeless tobacco: Never   Tobacco comments:    started smoking at [age]he 16, and quit at age 46  Vaping Use   Vaping Use: Never used  Substance and Sexual Activity   Alcohol use: Yes    Alcohol/week: 0.0 standard drinks    Comment: occasional alcohol use   Drug use: No   Sexual activity: Not on file  Other Topics Concern   Not on file  Social History Narrative   Not on file   Social Determinants of Health   Financial Resource Strain: Not on file  Food Insecurity: Not on file  Transportation Needs: Not on file  Physical Activity: Not on file  Stress: Not on file  Social Connections: Not on file    Allergies:  Allergies  Allergen Reactions   Bupropion     Extreme anxiety   Morphine Other (See Comments)    Metabolic Disorder Labs: Lab Results  Component Value Date   HGBA1C 13.5 (A) 06/19/2021   MPG 243.17 01/23/2020   No results found for: PROLACTIN Lab Results  Component Value Date   CHOL 140 10/03/2020   TRIG 259 (H) 10/03/2020   HDL 37 (L) 10/03/2020   CHOLHDL 3.8 10/03/2020   LDLCALC 62 10/03/2020   LDLCALC 42 05/24/2019   Lab Results  Component Value Date   TSH 1.590 12/04/2020   TSH 2.160 10/03/2020    Therapeutic Level Labs: Lab Results  Component Value Date   LITHIUM 0.6 12/04/2020   LITHIUM 0.37 (L) 09/08/2020   No results found for: VALPROATE No components found for:  CBMZ  Current Medications: Current Outpatient Medications   Medication Sig Dispense Refill   BD PEN NEEDLE NANO 2ND GEN 32G X 4 MM MISC USE AS DIRECTED 4 TIMES A DAY 100 each 3   blood glucose meter kit and supplies KIT Dispense based on patient and insurance preference. Use up to four times daily as directed. (FOR ICD-9 250.00, 250.01). 1 each 0   Continuous Blood Gluc Sensor (FREESTYLE LIBRE 14 DAY SENSOR) MISC SMARTSIG:1 Each Topical Every 2 Weeks     Dulaglutide 3 MG/0.5ML SOPN Inject 3 mg into the skin once a week. 2 mL 6   fluconazole (DIFLUCAN)  150 MG tablet TAKE 1 TABLET BY MOUTH AS ONE DOSE 1 tablet 3   insulin aspart (NOVOLOG FLEXPEN) 100 UNIT/ML FlexPen Inject up to 10 units three times daily as directed by physician 15 mL 5   ketoconazole (NIZORAL) 2 % cream APPLY TO AFFECTED AREA ONCE DAILY FOR 7 DAYS AS NEEDED (Patient taking differently: Apply 1 application topically daily. APPLY TO AFFECTED AREA ONCE DAILY FOR 7 DAYS AS NEEDED) 15 g 3   LEVEMIR FLEXTOUCH 100 UNIT/ML FlexTouch Pen Inject 46 Units into the skin daily.     omeprazole (PRILOSEC) 40 MG capsule TAKE 1 CAPSULE BY MOUTH EVERY DAY 90 capsule 4   oxyCODONE-acetaminophen (PERCOCET) 7.5-325 MG tablet Take 1 tablet by mouth every 4 (four) hours as needed. 60 tablet 0   rosuvastatin (CRESTOR) 20 MG tablet TAKE 1 TABLET BY MOUTH EVERY DAY 90 tablet 2   sildenafil (VIAGRA) 50 MG tablet Take 1-2 tablets (50-100 mg total) by mouth daily as needed. 90 tablet 3   tamsulosin (FLOMAX) 0.4 MG CAPS capsule Take 0.4 mg by mouth daily.     ALPRAZolam (XANAX) 0.5 MG tablet TAKE 1 TABLET BY MOUTH EVERY 4 HOURS AS NEEDE FOR ANXIETY 30 tablet 1   Eszopiclone 3 MG TABS Take 1 tablet (3 mg total) by mouth at bedtime. Take immediately before bedtime 30 tablet 1   hydrOXYzine (VISTARIL) 50 MG capsule Take 1-2 capsules (50-100 mg total) by mouth daily as needed. 60 capsule 1   lamoTRIgine (LAMICTAL) 100 MG tablet Take 1.5 tablets (150 mg total) by mouth daily. 45 tablet 1   lithium carbonate 300 MG capsule  Take 1-2 capsules (300-600 mg total) by mouth as directed. START TAKING 1 CAP DAILY AM AND 2 CAPS DAILY PM 90 capsule 1   metFORMIN (GLUCOPHAGE-XR) 500 MG 24 hr tablet TAKE 2 TABLETS BY MOUTH TWICE A DAY 120 tablet 1   prazosin (MINIPRESS) 2 MG capsule Take 1 capsule (2 mg total) by mouth at bedtime. 30 capsule 1   prochlorperazine (COMPAZINE) 10 MG tablet TAKE 1/2 - 1 TABLETS BY MOUTH EVERY 6 (SIX) HOURS AS NEEDED FOR NAUSEA OR VOMITING. 30 tablet 3   Vilazodone HCl (VIIBRYD) 40 MG TABS Take 1 tablet (40 mg total) by mouth daily. 90 tablet 0   No current facility-administered medications for this visit.     Musculoskeletal: Strength & Muscle Tone: within normal limits Gait & Station: normal Patient leans: N/A  Psychiatric Specialty Exam: Review of Systems  Gastrointestinal:  Positive for vomiting (chronic).  Psychiatric/Behavioral:  Positive for dysphoric mood and sleep disturbance. The patient is nervous/anxious.   All other systems reviewed and are negative.  Blood pressure (!) 148/88, pulse (!) 44, temperature 98.3 F (36.8 C), temperature source Temporal, weight (!) 322 lb 3.2 oz (146.1 kg).Body mass index is 42.51 kg/m.  General Appearance: Casual  Eye Contact:  Fair  Speech:  Clear and Coherent  Volume:  Normal  Mood:  Anxious and Depressed  Affect:  Congruent  Thought Process:  Goal Directed and Descriptions of Associations: Intact  Orientation:  Full (Time, Place, and Person)  Thought Content: Logical   Suicidal Thoughts:  No  Homicidal Thoughts:  No  Memory:  Immediate;   Fair Recent;   Fair Remote;   Fair  Judgement:  Fair  Insight:  Fair  Psychomotor Activity:  Normal  Concentration:  Concentration: Fair and Attention Span: Fair  Recall:  [employer] of Knowledge: Fair  Language: Fair  Akathisia:  No  Handed:  Right  AIMS (if indicated): done,0  Assets:  Communication Skills Desire for Improvement Housing Intimacy Social Support Talents/Skills  ADL's:   Intact  Cognition: WNL  Sleep:  Poor   Screenings: GAD-7    Flowsheet Row Video Visit from 03/14/2021 in [facility] Video Visit from 10/18/2020 in [place] [facility] from 03/10/2020 in [place] Visit from 04/16/2019 in [facility]  Total GAD-7 Score $RemoveBef'2 6 13 20      'EajZLpugFY$ PHQ2-9    [place] Visit from 06/20/2021 in [place] Video Visit from 03/14/2021 in [place] Office Visit from 03/05/2021 in [facility] Video Visit from 01/23/2021 in [place] from 01/15/2021 in [place]  PHQ-2 Total Score 4 0 0 2 4  PHQ-9 Total Score $RemoveBef'14 1 2 12 18      'WNiZOlzllo$ Flowsheet Row Video Visit from 03/14/2021 in [place] Video Visit from 01/23/2021 in [facility] from 01/15/2021 in [place] Low Risk Low Risk Low Risk        Assessment and Plan: Bryon Parker Andalon is a 46 year old Caucasian male, employed, lives in [place], has a history of MDD, OSA on CPAP, diabetes mellitus, arthritis was evaluated in office today.  Patient has been noncompliant with follow-up appointments due to health insurance problems, currently returns for medication management, reporting worsening mood symptoms.  Patient with multiple psychosocial stressors as noted above will continue to benefit from medication management and reestablishing care with psychotherapist.  Discussed plan as noted below.  Plan MDD-unstable Increase lamotrigine to 150 mg p.o. daily. Continue lithium 900 mg p.o. daily divided dosage Reviewed lithium level-dated 12/04/2020-0.6-therapeutic. Viibryd 40 mg p.o. daily  GAD-unstable Restart psychotherapy sessions.  Patient will benefit from  DBT.  I have communicated with Ms. Christina Hussami Continue Xanax as prescribed per primary care provider. Hydroxyzine 50-100 mg p.o nightly.  Social anxiety disorder-improving Continue psychotherapy sessions.  Insomnia-unstable Restart Lunesta 3 mg p.o. nightly Prazosin 2 mg p.o. nightly.  High risk medication use-will order lithium level, BUN plus creatinine, TSH.  Provided lab slip.  Reviewed patient's blood pressure and heart rate in session-abnormal-148/88 and pulse rate of 44.  However patient had recent reading at his primary care doctor's office-blood pressure-125/70, pulse rate-86.  Patient to continue to follow up with primary care provider.  Follow-up in clinic in 2 weeks or sooner if needed.  This note was generated in part or whole with voice recognition software. Voice recognition is usually quite accurate but there are transcription errors that can and very often do occur. I apologize for any typographical errors that were not detected and corrected.        Ursula Alert, MD 06/22/2021, 8:15 AM

## 2021-06-21 ENCOUNTER — Other Ambulatory Visit: Payer: Self-pay | Admitting: Family Medicine

## 2021-06-21 DIAGNOSIS — R112 Nausea with vomiting, unspecified: Secondary | ICD-10-CM

## 2021-06-21 DIAGNOSIS — F419 Anxiety disorder, unspecified: Secondary | ICD-10-CM

## 2021-06-21 DIAGNOSIS — E119 Type 2 diabetes mellitus without complications: Secondary | ICD-10-CM

## 2021-06-22 ENCOUNTER — Other Ambulatory Visit: Payer: Self-pay | Admitting: Psychiatry

## 2021-06-22 NOTE — Telephone Encounter (Signed)
Requested Prescriptions  Pending Prescriptions Disp Refills   metFORMIN (GLUCOPHAGE-XR) 500 MG 24 hr tablet [Pharmacy Med Name: METFORMIN HCL ER 500 MG TABLET] 120 tablet 1    Sig: TAKE 2 TABLETS BY MOUTH TWICE A DAY     Endocrinology:  Diabetes - Biguanides Failed - 06/21/2021  6:55 PM      Failed - HBA1C is between 0 and 7.9 and within 180 days    Hemoglobin A1C  Date Value Ref Range Status  06/19/2021 13.5 (A) 4.0 - 5.6 % Final   Hgb A1c MFr Bld  Date Value Ref Range Status  01/23/2020 10.1 (H) 4.8 - 5.6 % Final    Comment:    (NOTE) Pre diabetes:          5.7%-6.4%  Diabetes:              >6.4%  Glycemic control for   <7.0% adults with diabetes          Passed - Cr in normal range and within 360 days    Creatinine  Date Value Ref Range Status  09/07/2014 0.68 mg/dL Final    Comment:    0.61-1.24 NOTE: New Reference Range  08/23/14   09/07/2014 0.68 mg/dL Final    Comment:    0.61-1.24 NOTE: New Reference Range  08/23/14    Creatinine, Ser  Date Value Ref Range Status  12/04/2020 0.76 0.76 - 1.27 mg/dL Final   Creatinine, POC  Date Value Ref Range Status  02/14/2016 n/a mg/dL Final         Passed - eGFR in normal range and within 360 days    EGFR (African American)  Date Value Ref Range Status  09/07/2014 >60  Final  09/07/2014 >60  Final   GFR calc Af Amer  Date Value Ref Range Status  08/10/2020 131 >59 mL/min/1.73 Final    Comment:    **In accordance with recommendations from the NKF-ASN Task force,**   [facility] is in the process of updating its eGFR calculation to the   2021 CKD-EPI creatinine equation that estimates kidney function   without a race variable.    EGFR (Non-African Amer.)  Date Value Ref Range Status  09/07/2014 >60  Final    Comment:    eGFR values <48mL/min/1.73 m2 may be an indication of chronic kidney disease (CKD). Calculated eGFR is useful in patients with stable renal function. The eGFR calculation will not be  reliable in acutely ill patients when serum creatinine is changing rapidly. It is not useful in patients on dialysis. The eGFR calculation may not be applicable to patients at the low and high extremes of body sizes, pregnant women, and vegetarians.   09/07/2014 >60  Final    Comment:    eGFR values <69mL/min/1.73 m2 may be an indication of chronic kidney disease (CKD). Calculated eGFR is useful in patients with stable renal function. The eGFR calculation will not be reliable in acutely ill patients when serum creatinine is changing rapidly. It is not useful in patients on dialysis. The eGFR calculation may not be applicable to patients at the low and high extremes of body sizes, pregnant women, and vegetarians.    GFR, Estimated  Date Value Ref Range Status  11/21/2020 >60 >60 mL/min Final    Comment:    (NOTE) Calculated using the CKD-EPI Creatinine Equation (2021)    eGFR  Date Value Ref Range Status  12/04/2020 115 >59 mL/min/1.73 Final         Passed -  Valid encounter within last 6 months    Recent Outpatient Visits          3 days ago Uncontrolled type 2 diabetes mellitus with hyperglycemia [facility])   [facility] Birdie Sons, MD   2 months ago Uncontrolled type 2 diabetes mellitus with hyperglycemia [facility])   [facility] Birdie Sons, MD   3 months ago Uncontrolled type 2 diabetes mellitus with hyperglycemia [facility])   [facility] Birdie Sons, MD   6 months ago [place], Donald E, MD   8 months ago Type 2 diabetes mellitus with ketoacidosis without coma, without long-term current use of insulin [facility])   [facility] Birdie Sons, MD      Future Appointments            In 2 months Fisher, Kirstie Peri, MD [facility]            prochlorperazine (COMPAZINE) 10 MG tablet [Pharmacy Med Name: PROCHLORPERAZINE 10 MG TAB] 30 tablet 3    Sig: TAKE  1/2 - 1 TABLETS BY MOUTH EVERY 6 (SIX) HOURS AS NEEDED FOR NAUSEA OR VOMITING.     Not Delegated - Gastroenterology: Antiemetics Failed - 06/21/2021  6:55 PM      Failed - This refill cannot be delegated      Passed - Valid encounter within last 6 months    Recent Outpatient Visits          3 days ago Uncontrolled type 2 diabetes mellitus with hyperglycemia [facility])   [facility] Birdie Sons, MD   2 months ago Uncontrolled type 2 diabetes mellitus with hyperglycemia [facility])   [facility] Birdie Sons, MD   3 months ago Uncontrolled type 2 diabetes mellitus with hyperglycemia [facility])   [facility] Birdie Sons, MD   6 months ago [place], Donald E, MD   8 months ago Type 2 diabetes mellitus with ketoacidosis without coma, without long-term current use of insulin [facility])   [facility] Birdie Sons, MD      Future Appointments            In 2 months Fisher, Kirstie Peri, MD [facility]            ALPRAZolam Duanne Moron) 0.5 MG tablet [Pharmacy Med Name: ALPRAZOLAM 0.5 MG TABLET] 30 tablet 0    Sig: TAKE 1 TABLET BY MOUTH EVERY 4 HOURS AS NEEDE FOR ANXIETY     Not Delegated - Psychiatry:  Anxiolytics/Hypnotics Failed - 06/21/2021  6:55 PM      Failed - This refill cannot be delegated      Failed - Urine Drug Screen completed in last 360 days      Passed - Valid encounter within last 6 months    Recent Outpatient Visits          3 days ago Uncontrolled type 2 diabetes mellitus with hyperglycemia [facility])   [facility] Birdie Sons, MD   2 months ago Uncontrolled type 2 diabetes mellitus with hyperglycemia [facility])   [facility] Birdie Sons, MD   3 months ago Uncontrolled type 2 diabetes mellitus with hyperglycemia [facility])   [facility] Birdie Sons, MD   6 months ago [place], Donald  E, MD   8 months ago Type 2 diabetes mellitus  with ketoacidosis without coma, without long-term current use of insulin [facility])   [facility] Birdie Sons, MD      Future Appointments            In 2 months Fisher, Kirstie Peri, MD [facility] [place]

## 2021-06-22 NOTE — Telephone Encounter (Signed)
Requested medication (s) are due for refill today: yes  Requested medication (s) are on the active medication list: yes  Last refill:  03/2021  Future visit scheduled: 08/22/21  Notes to clinic:  This medication can not be delegated, please assess.   Requested Prescriptions  Pending Prescriptions Disp Refills   prochlorperazine (COMPAZINE) 10 MG tablet [Pharmacy Med Name: PROCHLORPERAZINE 10 MG TAB] 30 tablet 3    Sig: TAKE 1/2 - 1 TABLETS BY MOUTH EVERY 6 (SIX) HOURS AS NEEDED FOR NAUSEA OR VOMITING.     Not Delegated - Gastroenterology: Antiemetics Failed - 06/21/2021  6:55 PM      Failed - This refill cannot be delegated      Passed - Valid encounter within last 6 months    Recent Outpatient Visits           3 days ago Uncontrolled type 2 diabetes mellitus with hyperglycemia [facility])   [facility] Birdie Sons, MD   2 months ago Uncontrolled type 2 diabetes mellitus with hyperglycemia [facility])   [facility] Birdie Sons, MD   3 months ago Uncontrolled type 2 diabetes mellitus with hyperglycemia [facility])   [facility] Birdie Sons, MD   6 months ago [place], Donald E, MD   8 months ago Type 2 diabetes mellitus with ketoacidosis without coma, without long-term current use of insulin [facility])   [facility] Birdie Sons, MD       Future Appointments             In 2 months Fisher, Kirstie Peri, MD [facility]             ALPRAZolam Duanne Moron) 0.5 MG tablet [Pharmacy Med Name: ALPRAZOLAM 0.5 MG TABLET] 30 tablet 0    Sig: TAKE 1 TABLET BY MOUTH EVERY 4 HOURS AS NEEDE FOR ANXIETY     Not Delegated - Psychiatry:  Anxiolytics/Hypnotics Failed - 06/21/2021  6:55 PM      Failed - This refill cannot be delegated      Failed - Urine Drug Screen completed in last 360 days      Passed - Valid encounter within last 6 months    Recent Outpatient Visits           3  days ago Uncontrolled type 2 diabetes mellitus with hyperglycemia [facility])   [facility] Birdie Sons, MD   2 months ago Uncontrolled type 2 diabetes mellitus with hyperglycemia [facility])   [facility] Birdie Sons, MD   3 months ago Uncontrolled type 2 diabetes mellitus with hyperglycemia [facility])   [facility] Birdie Sons, MD   6 months ago [place], Donald E, MD   8 months ago Type 2 diabetes mellitus with ketoacidosis without coma, without long-term current use of insulin [place] [facility])   [facility] Birdie Sons, MD       Future Appointments             In 2 months Fisher, Kirstie Peri, MD [facility], [facility]            Signed Prescriptions Disp Refills   metFORMIN (GLUCOPHAGE-XR) 500 MG 24 hr tablet 120 tablet 1    Sig: TAKE 2 TABLETS BY MOUTH TWICE A DAY     Endocrinology:  Diabetes - Biguanides Failed - 06/21/2021  6:55 PM      Failed -  HBA1C is between 0 and 7.9 and within 180 days    Hemoglobin A1C  Date Value Ref Range Status  06/19/2021 13.5 (A) 4.0 - 5.6 % Final   Hgb A1c MFr Bld  Date Value Ref Range Status  01/23/2020 10.1 (H) 4.8 - 5.6 % Final    Comment:    (NOTE) Pre diabetes:          5.7%-6.4%  Diabetes:              >6.4%  Glycemic control for   <7.0% adults with diabetes           Passed - Cr in normal range and within 360 days    Creatinine  Date Value Ref Range Status  09/07/2014 0.68 mg/dL Final    Comment:    8.31-6.74 NOTE: New Reference Range  08/23/14   09/07/2014 0.68 mg/dL Final    Comment:    2.55-2.58 NOTE: New Reference Range  08/23/14    Creatinine, Ser  Date Value Ref Range Status  12/04/2020 0.76 0.76 - 1.27 mg/dL Final   Creatinine, POC  Date Value Ref Range Status  02/14/2016 n/a mg/dL Final          Passed - eGFR in normal range and within 360 days    EGFR (African American)  Date Value Ref Range Status   09/07/2014 >60  Final  09/07/2014 >60  Final   GFR calc Af Amer  Date Value Ref Range Status  08/10/2020 131 >59 mL/min/1.73 Final    Comment:    **In accordance with recommendations from the NKF-ASN Task force,**   [facility] is in the process of updating its eGFR calculation to the   2021 CKD-EPI creatinine equation that estimates kidney function   without a race variable.    EGFR (Non-African Amer.)  Date Value Ref Range Status  09/07/2014 >60  Final    Comment:    eGFR values <35mL/min/1.73 m2 may be an indication of chronic kidney disease (CKD). Calculated eGFR is useful in patients with stable renal function. The eGFR calculation will not be reliable in acutely ill patients when serum creatinine is changing rapidly. It is not useful in patients on dialysis. The eGFR calculation may not be applicable to patients at the low and high extremes of body sizes, pregnant women, and vegetarians.   09/07/2014 >60  Final    Comment:    eGFR values <35mL/min/1.73 m2 may be an indication of chronic kidney disease (CKD). Calculated eGFR is useful in patients with stable renal function. The eGFR calculation will not be reliable in acutely ill patients when serum creatinine is changing rapidly. It is not useful in patients on dialysis. The eGFR calculation may not be applicable to patients at the low and high extremes of body sizes, pregnant women, and vegetarians.    GFR, Estimated  Date Value Ref Range Status  11/21/2020 >60 >60 mL/min Final    Comment:    (NOTE) Calculated using the CKD-EPI Creatinine Equation (2021)    eGFR  Date Value Ref Range Status  12/04/2020 115 >59 mL/min/1.73 Final          Passed - Valid encounter within last 6 months    Recent Outpatient Visits           3 days ago Uncontrolled type 2 diabetes mellitus with hyperglycemia [facility])   [facility] Malva Limes, MD   2 months ago Uncontrolled type 2 diabetes mellitus with  hyperglycemia (HCC)  [place], MD   3 months ago Uncontrolled type 2 diabetes mellitus with hyperglycemia [facility])   [facility] Birdie Sons, MD   6 months ago [place], Donald E, MD   8 months ago Type 2 diabetes mellitus with ketoacidosis without coma, without long-term current use of insulin [facility])   [facility] Birdie Sons, MD       Future Appointments             In 2 months Fisher, Kirstie Peri, MD [facility], [place]

## 2021-06-23 LAB — BUN+CREAT
BUN/Creatinine Ratio: 11 (ref 9–20)
BUN: 8 mg/dL (ref 6–24)
Creatinine, Ser: 0.7 mg/dL — ABNORMAL LOW (ref 0.76–1.27)
eGFR: 117 mL/min/{1.73_m2} (ref >59)

## 2021-06-23 LAB — TSH: TSH: 0.91 u[IU]/mL (ref 0.450–4.500)

## 2021-06-23 LAB — LITHIUM LEVEL: Lithium Lvl: 0.5 mmol/L (ref 0.5–1.2)

## 2021-06-25 ENCOUNTER — Other Ambulatory Visit: Payer: Self-pay | Admitting: Family Medicine

## 2021-06-26 ENCOUNTER — Other Ambulatory Visit: Payer: Self-pay | Admitting: Family Medicine

## 2021-06-26 NOTE — Telephone Encounter (Signed)
Requested medication (s) are due for refill today: NO, just filled 06/21/21  Requested medication (s) are on the active medication list: yes, as one time dose with refills  Last refill:  06/21/21  Future visit scheduled: 08/22/21  Notes to clinic:  there is not a protocol to follow for this med, please assess.  Requested Prescriptions  Pending Prescriptions Disp Refills   fluconazole (DIFLUCAN) 150 MG tablet [Pharmacy Med Name: FLUCONAZOLE 150 MG TABLET] 1 tablet 3    Sig: TAKE 1 TABLET BY MOUTH AS ONE DOSE     Off-Protocol Failed - 06/25/2021  6:27 PM      Failed - Medication not assigned to a protocol, review manually.      Passed - Valid encounter within last 12 months    Recent Outpatient Visits           1 week ago Uncontrolled type 2 diabetes mellitus with hyperglycemia [facility])   [facility] Birdie Sons, MD   2 months ago Uncontrolled type 2 diabetes mellitus with hyperglycemia [facility])   [facility] Birdie Sons, MD   3 months ago Uncontrolled type 2 diabetes mellitus with hyperglycemia [facility])   [facility] Birdie Sons, MD   6 months ago [place], Donald E, MD   8 months ago Type 2 diabetes mellitus with ketoacidosis without coma, without long-term current use of insulin [facility])   [facility] Birdie Sons, MD       Future Appointments             In 1 month Fisher, Kirstie Peri, MD [facility], [place]

## 2021-06-29 ENCOUNTER — Other Ambulatory Visit: Payer: Self-pay | Admitting: Family Medicine

## 2021-07-02 MED ORDER — OXYCODONE-ACETAMINOPHEN 7.5-325 MG PO TABS: 1.0000 | ORAL_TABLET | ORAL | 0 refills | Status: DC | PRN

## 2021-07-05 ENCOUNTER — Other Ambulatory Visit: Payer: Self-pay | Admitting: Family Medicine

## 2021-07-05 DIAGNOSIS — F419 Anxiety disorder, unspecified: Secondary | ICD-10-CM

## 2021-07-05 NOTE — Telephone Encounter (Signed)
Requested medication (s) are due for refill today:   Requested medication (s) are on the active medication list: Yes  Last refill:  06/22/21  Future visit scheduled: Yes  Notes to clinic:  See request.    Requested Prescriptions  Pending Prescriptions Disp Refills   ALPRAZolam (XANAX) 0.5 MG tablet [Pharmacy Med Name: ALPRAZOLAM 0.5 MG TABLET] 30 tablet 1    Sig: TAKE 1 TABLET BY MOUTH EVERY 4 HOURS AS NEEDE FOR ANXIETY     Not Delegated - Psychiatry:  Anxiolytics/Hypnotics Failed - 07/05/2021  9:59 AM      Failed - This refill cannot be delegated      Failed - Urine Drug Screen completed in last 360 days      Passed - Valid encounter within last 6 months    Recent Outpatient Visits           2 weeks ago Uncontrolled type 2 diabetes mellitus with hyperglycemia [facility])   [facility] Birdie Sons, MD   2 months ago Uncontrolled type 2 diabetes mellitus with hyperglycemia [facility])   [facility] Birdie Sons, MD   4 months ago Uncontrolled type 2 diabetes mellitus with hyperglycemia [facility])   [facility] Birdie Sons, MD   7 months ago [place], Donald E, MD   9 months ago Type 2 diabetes mellitus with ketoacidosis without coma, without long-term current use of insulin [facility])   [facility] Birdie Sons, MD       Future Appointments             In 1 month Fisher, Kirstie Peri, MD [facility], [place]

## 2021-07-06 NOTE — Telephone Encounter (Signed)
LOV:  06/19/2021  NOV:  08/22/2021   Thanks,   -Mickel Baas

## 2021-07-09 ENCOUNTER — Telehealth: Payer: Self-pay

## 2021-07-09 NOTE — Telephone Encounter (Signed)
Returned call to patient.  Advised to check GoodRx, use co-pay card to see if Viibryd will be affordable.  Otherwise advised the patient to cut Viibryd into half tablet until he is scheduled to see me end of January.  Further medication changes will be made during the evaluation.  Patient agreeable.

## 2021-07-09 NOTE — Telephone Encounter (Signed)
made pt a appt but it had to be next week . he has changed jobs so that is why he had an appt set up for Feb. he states that they insurance will not cover vbiibryd and that the insurance states that they would approved trazodone.

## 2021-07-09 NOTE — Telephone Encounter (Signed)
pt left a message that he wants to try something different and he wanted to speak with you about this.

## 2021-07-09 NOTE — Telephone Encounter (Signed)
We will have jonathan contact this patient to schedule an appointment for tomorrow for medication changes.  We will prefer this patient to have an evaluation prior to making more changes.  Last visit he was advised to come back in 2 weeks and this was the beginning of January.

## 2021-07-10 ENCOUNTER — Other Ambulatory Visit: Payer: Self-pay | Admitting: Family Medicine

## 2021-07-11 ENCOUNTER — Other Ambulatory Visit: Payer: Self-pay | Admitting: Family Medicine

## 2021-07-12 ENCOUNTER — Telehealth: Payer: Self-pay

## 2021-07-12 NOTE — Telephone Encounter (Signed)
Yes we discussed that medication changes will be made at his next visit and he does have one scheduled for 1/31. PLEASE LET PATIENT KNOW.

## 2021-07-12 NOTE — Telephone Encounter (Signed)
pt left a message that he needed to speak with you about the alternation of medications that you two discussed.

## 2021-07-13 ENCOUNTER — Telehealth: Payer: Self-pay

## 2021-07-13 DIAGNOSIS — F411 Generalized anxiety disorder: Secondary | ICD-10-CM

## 2021-07-13 MED ORDER — OXYCODONE-ACETAMINOPHEN 7.5-325 MG PO TABS: 1.0000 | ORAL_TABLET | ORAL | 0 refills | Status: DC | PRN

## 2021-07-13 MED ORDER — VILAZODONE HCL 40 MG PO TABS: 40.0000 mg | ORAL_TABLET | Freq: Every day | ORAL | 0 refills | Status: DC

## 2021-07-13 NOTE — Telephone Encounter (Signed)
I have [facility]d [facility]-30 days.

## 2021-07-13 NOTE — Telephone Encounter (Signed)
pt called please send the [facility]d [facility] for a 30 day supply

## 2021-07-17 ENCOUNTER — Telehealth: Payer: 59 | Admitting: Psychiatry

## 2021-07-23 ENCOUNTER — Ambulatory Visit: Payer: BC Managed Care – PPO | Admitting: Family Medicine

## 2021-07-27 ENCOUNTER — Other Ambulatory Visit: Payer: Self-pay | Admitting: Family Medicine

## 2021-07-27 DIAGNOSIS — E1165 Type 2 diabetes mellitus with hyperglycemia: Secondary | ICD-10-CM

## 2021-07-27 NOTE — Telephone Encounter (Signed)
Please advise refill?  LOV: 06/19/2021 NOV: 08/22/2021 Last refill: 07/13/2021 #60 w/0 refills

## 2021-07-27 NOTE — Telephone Encounter (Signed)
Requested medications are due for refill today.  yes  Requested medications are on the active medications list.  yes  Last refill. 04/20/2021  Future visit scheduled.  yes  Notes to clinic.  Dosage is different between request and med list. Please review.    Requested Prescriptions  Pending Prescriptions Disp Refills   LEVEMIR FLEXTOUCH 100 UNIT/ML FlexTouch Pen [Pharmacy Med Name: LEVEMIR FLEXTOUCH 100 UNIT/ML]  1    Sig: INJECT [place] DAILY     Endocrinology:  Diabetes - Insulins Failed - 07/27/2021  4:25 PM      Failed - HBA1C is between 0 and 7.9 and within 180 days    Hemoglobin A1C  Date Value Ref Range Status  06/19/2021 13.5 (A) 4.0 - 5.6 % Final   Hgb A1c MFr Bld  Date Value Ref Range Status  01/23/2020 10.1 (H) 4.8 - 5.6 % Final    Comment:    (NOTE) Pre diabetes:          5.7%-6.4%  Diabetes:              >6.4%  Glycemic control for   <7.0% adults with diabetes           Passed - Valid encounter within last 6 months    Recent Outpatient Visits           1 month ago Uncontrolled type 2 diabetes mellitus with hyperglycemia [facility])   [facility] Birdie Sons, MD   3 months ago Uncontrolled type 2 diabetes mellitus with hyperglycemia [facility])   [place] Birdie Sons, MD   4 months ago Uncontrolled type 2 diabetes mellitus with hyperglycemia [facility])   [facility] Birdie Sons, MD   7 months ago [place], Donald E, MD   9 months ago Type 2 diabetes mellitus with ketoacidosis without coma, without long-term current use of insulin [facility])   [facility] Birdie Sons, MD       Future Appointments             In 3 weeks Fisher, Kirstie Peri, MD [facility], [place]

## 2021-07-28 ENCOUNTER — Other Ambulatory Visit: Payer: Self-pay | Admitting: Family Medicine

## 2021-07-28 DIAGNOSIS — E1165 Type 2 diabetes mellitus with hyperglycemia: Secondary | ICD-10-CM

## 2021-07-28 MED ORDER — OXYCODONE-ACETAMINOPHEN 7.5-325 MG PO TABS: 1.0000 | ORAL_TABLET | ORAL | 0 refills | Status: DC | PRN

## 2021-07-31 ENCOUNTER — Other Ambulatory Visit: Payer: Self-pay

## 2021-07-31 ENCOUNTER — Ambulatory Visit (INDEPENDENT_AMBULATORY_CARE_PROVIDER_SITE_OTHER): Payer: 59 | Admitting: Licensed Clinical Social Worker

## 2021-07-31 DIAGNOSIS — F331 Major depressive disorder, recurrent, moderate: Secondary | ICD-10-CM | POA: Diagnosis not present

## 2021-07-31 NOTE — Plan of Care (Signed)
°  Problem: Depression CCP Problem  1 Decrease depressive symptoms and improve levels of effective functioning-pt reports a decrease in overall depression symptoms 3 out of 5 sessions documented.  Goal: LTG: Reduce frequency, intensity, and duration of depression symptoms as evidenced by: SSB input needed on appropriate metric Outcome: Not Progressing Goal: STG: Raymond Rose WILL PARTICIPATE IN AT LEAST 80% OF SCHEDULED INDIVIDUAL PSYCHOTHERAPY SESSIONS Outcome: Progressing Intervention: Encourage compliance with prescribed medication regimen Intervention: Encourage family support Intervention: Encourage self-care activities Intervention: REVIEW PLEASE SKILLS (TREAT PHYSICAL ILLNESS, BALANCE EATING, AVOID MOOD-ALTERING SUBSTANCES, BALANCE SLEEP AND GET EXERCISE) WITH Raymond Rose   Problem: Anxiety Disorder CCP Problem  1 Reduce overall frequency, intensity, and duration of the anxiety so that daily functioning is not impaired per pt self report 3 out of 5 sessions documented.   Goal: LTG: Patient will score less than 5 on the Generalized Anxiety Disorder 7 Scale (GAD-7) Outcome: Progressing Goal: STG: Patient will participate in at least 80% of scheduled individual psychotherapy sessions Outcome: Progressing Intervention: Assist with relaxation techniques, as appropriate (deep breathing exercises, meditation, guided imagery)

## 2021-07-31 NOTE — Progress Notes (Signed)
Virtual Visit via Video Note  I connected with Raymond Rose on 07/31/21 at  1:00 PM EST by a video enabled telemedicine application and verified that I am speaking with the correct person using two identifiers.  Location: Patient: home  Provider: remote office [place], [place])   I discussed the limitations of evaluation and management by telemedicine and the availability of in person appointments. The patient expressed understanding and agreed to proceed.  I discussed the assessment and treatment plan with the patient. The patient was provided an opportunity to ask questions and all were answered. The patient agreed with the plan and demonstrated an understanding of the instructions.   The patient was advised to call back or seek an in-person evaluation if the symptoms worsen or if the condition fails to improve as anticipated.  I provided 30 minutes of non-face-to-face time during this encounter.   Viraj Liby R Cap Massi, LCSW   THERAPIST PROGRESS NOTE  Session Time: 1-130p  Participation Level: Active  Behavioral Response: Neat and Well GroomedAlertDepressed  Type of Therapy: Individual Therapy  Treatment Goals addressed:   Problem: Depression CCP Problem  1 Decrease depressive symptoms and improve levels of effective functioning-pt reports a decrease in overall depression symptoms 3 out of 5 sessions documented.   Goal: LTG: Reduce frequency, intensity, and duration of depression symptoms as evidenced by: SSB input needed on appropriate metric  Outcome: Not Progressing  Goal: STG: Raymond Rose WILL PARTICIPATE IN AT LEAST 80% OF SCHEDULED INDIVIDUAL PSYCHOTHERAPY SESSIONS  Outcome: Progressing  Problem: Anxiety Disorder CCP Problem  1 Reduce overall frequency, intensity, and duration of the anxiety so that daily functioning is not impaired per pt self report 3 out of 5 sessions documented.    Goal: LTG: Patient will score less than 5 on the Generalized Anxiety Disorder 7  Scale (GAD-7)  Outcome: Progressing  Goal: STG: Patient will participate in at least 80% of scheduled individual psychotherapy sessions  Outcome: Progressing Interventions:  Intervention: Encourage compliance with prescribed medication regimen  Intervention: Encourage family support  Intervention: Encourage self-care activities  Intervention: REVIEW PLEASE SKILLS (TREAT PHYSICAL ILLNESS, BALANCE EATING, AVOID MOOD-ALTERING SUBSTANCES, BALANCE SLEEP AND GET EXERCISE) WITH Raymond Rose  Intervention: Assist with relaxation techniques, as appropriate (deep breathing exercises, meditation, guided imagery)  Summary: Raymond Rose is a 46 y.o. male who presents with continuing symptoms related to MDD diagnosis. Pt reports that he will feel better for a while and then hit a "low point" where suicidal thoughts are triggered sometimes daily when pt is having an episode.   Allowed pt to explore and express thoughts and feelings associated with recent life situations and external stressors. Pt reports that he feels his medications are not working and that counseling is not working (last counseling session was 02/13/2021).   Pt has not been very compliant with CBT based strategies--may need more frequent visits or DBT program. Pt has started a new job in January and doesn't want his treatment to interfere with his work schedule.   Pt reports that he is continuing with the cyclical vomiting and that he has missed over a week of work at his new job. Pt reports this is why he was let go at his previous job.   Pt states that his disability is pending and that his attorney thinks that they are close to a resolution.    Continued recommendations are as follows: self care behaviors, positive social engagements, focusing on overall work/home/life balance, and focusing on positive physical and emotional wellness.  Suicidal/Homicidal: No  Therapist Response: Pt reporting that he is continuing to  experience significant depression, even with medication compliance. Pt reports that he feels counseling is not working (last appt 02/13/2021).  Continuing to offer unconditional support of pts psychiatric care--if pt requesting referrals, will provide them to pt.Pt articulated he is interested in ketamine or [place]  Plan: Return again in 4 weeks.  Diagnosis: Axis I: MDD, recurrent, mild; GAD    Axis II: No diagnosis    Rachel Bo Ryonna Cimini, LCSW 07/31/2021

## 2021-08-01 ENCOUNTER — Other Ambulatory Visit: Payer: Self-pay

## 2021-08-01 ENCOUNTER — Encounter: Payer: Self-pay | Admitting: Psychiatry

## 2021-08-01 ENCOUNTER — Telehealth (INDEPENDENT_AMBULATORY_CARE_PROVIDER_SITE_OTHER): Payer: 59 | Admitting: Psychiatry

## 2021-08-01 DIAGNOSIS — Z79899 Other long term (current) drug therapy: Secondary | ICD-10-CM

## 2021-08-01 DIAGNOSIS — Z91199 Patient's noncompliance with other medical treatment and regimen due to unspecified reason: Secondary | ICD-10-CM

## 2021-08-01 DIAGNOSIS — F5105 Insomnia due to other mental disorder: Secondary | ICD-10-CM | POA: Diagnosis not present

## 2021-08-01 DIAGNOSIS — F411 Generalized anxiety disorder: Secondary | ICD-10-CM | POA: Diagnosis not present

## 2021-08-01 DIAGNOSIS — F331 Major depressive disorder, recurrent, moderate: Secondary | ICD-10-CM

## 2021-08-01 DIAGNOSIS — F4010 Social phobia, unspecified: Secondary | ICD-10-CM

## 2021-08-01 MED ORDER — LAMOTRIGINE 100 MG PO TABS: 150.0000 mg | ORAL_TABLET | Freq: Every day | ORAL | 1 refills | Status: DC

## 2021-08-01 MED ORDER — VILAZODONE HCL 40 MG PO TABS: 40.0000 mg | ORAL_TABLET | Freq: Every day | ORAL | 1 refills | Status: DC

## 2021-08-01 MED ORDER — ESZOPICLONE 3 MG PO TABS: 3.0000 mg | ORAL_TABLET | Freq: Every day | ORAL | 1 refills | Status: DC

## 2021-08-01 MED ORDER — PRAZOSIN HCL 2 MG PO CAPS: 2.0000 mg | ORAL_CAPSULE | Freq: Every day | ORAL | 1 refills | Status: DC

## 2021-08-01 MED ORDER — LITHIUM CARBONATE 300 MG PO CAPS: 300.0000 mg | ORAL_CAPSULE | ORAL | 1 refills | Status: DC

## 2021-08-01 MED ORDER — HYDROXYZINE PAMOATE 50 MG PO CAPS: 50.0000 mg | ORAL_CAPSULE | Freq: Every day | ORAL | 1 refills | Status: DC | PRN

## 2021-08-01 NOTE — Progress Notes (Signed)
Virtual Visit via Video Note  I connected with Raymond Rose on 08/01/21 at 11:30 AM EST by a video enabled telemedicine application and verified that I am speaking with the correct person using two identifiers.  Location Provider Location : [facility] Patient Location : Car  Participants: Patient , Provider    I discussed the limitations of evaluation and management by telemedicine and the availability of in person appointments. The patient expressed understanding and agreed to proceed.   I discussed the assessment and treatment plan with the patient. The patient was provided an opportunity to ask questions and all were answered. The patient agreed with the plan and demonstrated an understanding of the instructions.   The patient was advised to call back or seek an in-person evaluation if the symptoms worsen or if the condition fails to improve as anticipated.   [place] MD OP Progress Note  08/01/2021 12:47 PM Raymond Rose  MRN:  [id number]  Chief Complaint:  Chief Complaint  Patient presents with   Follow-up 46 year old Caucasian male, has a history of MDD, GAD, social anxiety disorder, insomnia, cyclical vomiting, diabetes mellitus, presented for medication management.   HPI: Raymond Rose is a 46 year old Caucasian male, married, lives in [place], has a history of MDD, GAD, social anxiety disorder, sleep apnea, cyclical vomiting, diabetes mellitus, erectile dysfunction was evaluated by telemedicine today.  Patient's last appointment was on 06/20/2021.  Patient today returns reporting he is currently employed at [employer].  Patient reports he likes his job however he had cyclical vomiting again last week and had to take a week off from work.  He is currently worried that he will not be able to stay at this job due to his cyclical vomiting.  Patient reports last week when he had to take time off from work he felt extremely nauseous. This  also had an impact on his mood.  He  felt depressed.  Patient also reports he also had suicidal thoughts, however his wife was able to help him cope with it.  Patient reports he will not do anything to hurt himself since he cares for his wife.  Patient does have a history of chronic suicidality.  Patient also reports sleep problems the past few weeks.  Does have Lunesta available.  Agreeable to starting melatonin along with the Lunesta.  Patient reports he is compliant on his medications.  He however does not believe the current therapy and medications as helpful for his mood.  He constantly worries about his disability.  Patient also worries about keeping this job.  He has lost several jobs in the past.  Patient reports he is interested in probably getting [place] or ketamine.  He does not believe these medications are beneficial and would like to try alternate treatment options available in the community.  He is interested in getting resources for the same in the community.  Patient denies any side effects to medications.    Visit Diagnosis:    ICD-10-CM   1. MDD (major depressive disorder), recurrent episode, moderate (HCC)  F33.1 hydrOXYzine (VISTARIL) 50 MG capsule    2. GAD (generalized anxiety disorder)  F41.1 Vilazodone HCl (VIIBRYD) 40 MG TABS    3. Social anxiety disorder  F40.10 lithium carbonate 300 MG capsule    4. Insomnia due to mental disorder  F51.05 prazosin (MINIPRESS) 2 MG capsule   mood    5. High risk medication use  Z79.899 lamoTRIgine (LAMICTAL) 100 MG tablet    6. Noncompliance with  treatment regimen  Z91.199 Eszopiclone 3 MG TABS      Past Psychiatric History: Reviewed past psychiatric history from progress note on 04/21/2019.  Past trials of medications like Lexapro, Wellbutrin, Elavil, Effexor, Zyprexa, Seroquel.  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  Family History:  Family History  Problem Relation Age of Onset   Depression Mother    Heart attack Father 44   Arthritis Other    Alcohol abuse Other    Lung cancer Other    Hyperlipidemia Other    CAD Other    Stroke Other    Hypertension Other    Bipolar disorder Other     Social History: Reviewed social history from progress note on 04/21/2019. Social History   Socioeconomic History   Marital status: Married    Spouse name: Not on file   Number of children: Not on file   Years of education: Not on file   Highest education level: Not on file  Occupational History   Occupation: Banking    Comment: [employer]  Tobacco Use   Smoking status: Former    Years: 5.00    Types: Cigarettes    Quit date: 06/17/1997    Years since quitting: 24.1   Smokeless tobacco: Never   Tobacco comments:    started smoking at [age]he 16, and quit at age 64  Vaping Use   Vaping Use: Never used  Substance and Sexual Activity   Alcohol use: Yes    Alcohol/week: 0.0 standard drinks    Comment: occasional alcohol use   Drug use: No   Sexual activity: Not on file  Other Topics Concern   Not on file  Social History Narrative   Not on file   Social Determinants of Health   Financial Resource Strain: Not on file  Food Insecurity: Not on file  Transportation Needs: Not on file  Physical Activity: Not on file  Stress: Not on file  Social Connections: Not on file    Allergies:  Allergies  Allergen Reactions   Bupropion     Extreme anxiety    Morphine Other (See Comments)    Metabolic Disorder Labs: Lab Results  Component Value Date   HGBA1C 13.5 (A) 06/19/2021   MPG 243.17 01/23/2020   No results found for: PROLACTIN Lab Results  Component Value Date   CHOL 140 10/03/2020   TRIG 259 (H) 10/03/2020   HDL 37 (L) 10/03/2020   CHOLHDL 3.8 10/03/2020   LDLCALC 62 10/03/2020   LDLCALC 42 05/24/2019   Lab Results  Component Value Date   TSH 0.910 06/22/2021   TSH 1.590 12/04/2020    Therapeutic Level Labs: Lab Results  Component Value Date   LITHIUM 0.5 06/22/2021   LITHIUM 0.6 12/04/2020   No results found  for: VALPROATE No components found for:  CBMZ  Current Medications: Current Outpatient Medications  Medication Sig Dispense Refill   ALPRAZolam (XANAX) 0.5 MG tablet TAKE 1 TABLET BY MOUTH EVERY 4 HOURS AS NEEDE FOR ANXIETY 30 tablet 1   BD PEN NEEDLE NANO 2ND GEN 32G X 4 MM MISC USE AS DIRECTED 4 TIMES A DAY 100 each 3   blood glucose meter kit and supplies KIT Dispense based on patient and insurance preference. Use up to four times daily as directed. (FOR ICD-9 250.00, 250.01). 1 each 0   Continuous Blood Gluc Sensor (FREESTYLE LIBRE 14 DAY SENSOR) MISC SMARTSIG:1 Each Topical Every 2 Weeks     Dulaglutide 3 MG/0.5ML SOPN Inject 3 mg into the skin once a week. 2 mL 6   Eszopiclone 3 MG TABS Take 1 tablet (3 mg total) by mouth at bedtime. Take immediately before bedtime 30 tablet 1   fluconazole (DIFLUCAN) 150 MG tablet TAKE 1 TABLET BY MOUTH AS ONE DOSE 1 tablet 3   hydrOXYzine (VISTARIL) 50 MG capsule Take 1-2 capsules (50-100 mg total) by mouth daily as needed. 60 capsule 1   insulin aspart (NOVOLOG FLEXPEN) 100 UNIT/ML FlexPen Inject up to 10 units three times daily as directed by physician 15 mL 5   insulin detemir (LEVEMIR FLEXPEN) 100 UNIT/ML FlexPen Inject 46 Units into the skin daily. 15 mL 11   ketoconazole (NIZORAL) 2 % cream APPLY TO AFFECTED AREA ONCE DAILY FOR 7 DAYS AS NEEDED (Patient taking  differently: Apply 1 application topically daily. APPLY TO AFFECTED AREA ONCE DAILY FOR 7 DAYS AS NEEDED) 15 g 3   lamoTRIgine (LAMICTAL) 100 MG tablet Take 1.5 tablets (150 mg total) by mouth daily. 45 tablet 1   lithium carbonate 300 MG capsule Take 1-2 capsules (300-600 mg total) by mouth as directed. START TAKING 1 CAP DAILY AM AND 2 CAPS DAILY PM 90 capsule 1   metFORMIN (GLUCOPHAGE-XR) 500 MG 24 hr tablet TAKE 2 TABLETS BY MOUTH TWICE A DAY 120 tablet 1   omeprazole (PRILOSEC) 40 MG capsule TAKE 1 CAPSULE BY MOUTH EVERY DAY 90 capsule 4   oxyCODONE-acetaminophen (PERCOCET) 7.5-325 MG tablet Take 1 tablet by mouth every 4 (four) hours as needed. 60 tablet 0   prazosin (MINIPRESS) 2 MG capsule Take 1 capsule (2 mg total) by mouth at bedtime. 30 capsule 1   prochlorperazine (COMPAZINE) 10 MG tablet TAKE 1/2 - 1 TABLETS BY MOUTH EVERY 6 (SIX) HOURS AS NEEDED FOR NAUSEA OR VOMITING. 30 tablet 3   rosuvastatin (CRESTOR) 20 MG tablet TAKE 1 TABLET BY MOUTH EVERY DAY 90 tablet 2   sildenafil (VIAGRA) 50 MG tablet Take 1-2 tablets (50-100 mg total) by mouth daily as needed. 90 tablet 3   tamsulosin (FLOMAX) 0.4 MG CAPS capsule Take 0.4 mg by mouth daily.     Vilazodone HCl (VIIBRYD) 40 MG TABS Take 1 tablet (40 mg total) by mouth daily. 30 tablet 1   No current facility-administered medications for this visit.     Musculoskeletal: Strength & Muscle Tone:  UTA Gait & Station:  Seated Patient leans: N/A  Psychiatric Specialty Exam: Review of Systems  Psychiatric/Behavioral:  Positive for dysphoric mood and sleep disturbance. The patient is nervous/anxious.   All other systems reviewed and are negative.  There were no vitals taken for this visit.There is no height or weight on file to calculate BMI.  General Appearance: Casual  Eye Contact:  Fair  Speech:  Clear and Coherent  Volume:  Normal  Mood:  Anxious and Depressed  Affect:  Congruent  Thought Process:  Goal Directed and Descriptions  of Associations: Intact  Orientation:  Full (Time, Place, and Person)  Thought Content: Logical   Suicidal Thoughts:  No at this time , however does have a history of chronic SI  Homicidal Thoughts:  No  Memory:  Immediate;   Fair Recent;   Fair Remote;   Fair  Judgement:  Fair  Insight:  Fair  Psychomotor Activity:  Normal  Concentration:  Concentration: Fair and Attention Span: Fair  Recall:  [employer] of Knowledge: Fair  Language: Fair  Akathisia:  No  Handed:  Right  AIMS (if indicated): not done  Assets:  Communication Skills Desire for Improvement Housing Intimacy Social Support  ADL's:  Intact  Cognition: WNL  Sleep:   Restless   Screenings: GAD-7    Flowsheet Row Video Visit from 03/14/2021 in [facility] Video Visit from 10/18/2020 in [facility] from 03/10/2020 in [facility] Visit from 04/16/2019 in [facility]  Total GAD-7 Score $RemoveBef'2 6 13 20      'qWDgwGjCSe$ PHQ2-9    Flowsheet Row Video Visit from 08/01/2021 in [facility] Office Visit from 06/20/2021 in [facility] Video Visit from 03/14/2021 in [facility] Visit from 03/05/2021 in [facility] Video Visit from 01/23/2021 in [facility]  PHQ-2 Total Score 4 4 0 0 2  PHQ-9 Total Score $RemoveBef'12 14 1 2 12      'VjrxrQofoU$ Flowsheet Row Video Visit from 03/14/2021 in [facility] Video Visit from 01/23/2021 in [facility] from 01/15/2021 in [facility] Low Risk Low Risk Low Risk        Assessment and Plan: Raymond Rose is a 46 year old Caucasian male, employed, lives in [place], has a history of MDD, OSA on CPAP, diabetes mellitus, arthritis was evaluated by telemedicine today.   Patient today continues to report depression, anxiety symptoms and sleep problems along with cyclical vomiting which does have an impact on his mood.  Patient is interested in establishing care with another provider-due to his interest in [place], ketamine. Discussed plan as noted below.  Plan MDD-unstable Lamotrigine 150 mg p.o. daily Lithium 900 mg p.o. daily divided dosage Advised patient to hold the lithium if he has vomiting to avoid toxic levels due to dehydration. Reviewed and discussed lithium-06/22/2021-0.5. Viibryd 40 mg p.o. daily  GAD-unstable Patient will benefit from DBT.  Patient is interested in establishing care with another provider. Provided information for [facility] for DBT. I have communicated with Ms. Christina Hussami -current therapist. Continue hydroxyzine 50-100 mg p.o. nightly  Social anxiety disorder-improving Will continue to benefit from psychotherapy sessions.  Insomnia-unstable Lunesta 3 mg p.o. nightly Discussed adding low-dose melatonin along with the Lunesta. Also discussed adding hydroxyzine 50 to 100 mg p.o. nightly every night. Prazosin 2 mg p.o. nightly  High risk medication use-reviewed and discussed lithium level-06/22/2021-0.5, BUN/creatinine-within normal limits, TSH-within normal limits.   Provided patient information for [facility] for DBT, [facility] for medication management, possible Ketamine, also provided information for [facility] Filled out referral form for [facility] while in session, advised patient to come into the office to fill out a consent form so we could send this referral.  Patient reports he could either  do that or call [facility] for an appointment and could get them to obtain medical records from [place]. Crisis plan discussed with patient.  Patient advised to go to the nearest emergency department, [facility] urgent care if  in a crisis.  Patient also agrees to talk to his wife who is supportive.  We will release this patient since he is interested in establishing care with another provider.  I have provided a 42-month supply of medications.   Collaboration of Care: Collaboration of Care: Medication Management AEB filled out referral form for [facility], patient did provide consent prior to sending this referral., Psychiatrist AEB provided information for [facility], and Referral or follow-up with counselor/therapist AEB provided information for [facility] for DBT-patient agrees to give them a call  Patient/Guardian was advised Release of Information must be obtained prior to any record release in order to collaborate their care with an outside provider. Patient/Guardian was advised if they have not already done so to contact the registration department to sign all necessary forms in order for [place] to release information regarding their care.   Consent: Patient/Guardian gives verbal consent for treatment and assignment of benefits for services provided during this visit. Patient/Guardian expressed understanding and agreed to proceed.   This note was generated in part or whole with voice recognition software. Voice recognition is usually quite accurate but there are transcription errors that can and very often do occur. I apologize for any typographical errors that were not detected and corrected.  Addendun 08/03/2021 12:57 PM I have printed out a letter for patient regarding our discussion in session on 08/01/2021.  Also attached community resources with the letter, staff to make this to patient today.  I have also communicated treatment plan with therapist-Ms. Christina Hussami.  This note was generated in part or whole with voice recognition software. Voice recognition is usually quite accurate but there are transcription errors that can and very often do occur. I  apologize for any typographical errors that were not detected and corrected.       Ursula Alert, MD 08/01/2021, 12:47 PM

## 2021-08-02 ENCOUNTER — Encounter: Payer: Self-pay | Admitting: Psychiatry

## 2021-08-03 ENCOUNTER — Other Ambulatory Visit: Payer: Self-pay | Admitting: Family Medicine

## 2021-08-07 ENCOUNTER — Other Ambulatory Visit: Payer: Self-pay | Admitting: Family Medicine

## 2021-08-08 ENCOUNTER — Ambulatory Visit: Payer: Self-pay | Admitting: *Deleted

## 2021-08-08 ENCOUNTER — Encounter: Payer: Self-pay | Admitting: Family Medicine

## 2021-08-08 NOTE — Telephone Encounter (Signed)
°  Chief Complaint: medication request- cluster headache episode Symptoms: headache-comes and goes- R side of head- 3 days causing vomiting to be worse Frequency: 3 days- off/on Pertinent Negatives: Patient denies fever, stiff neck, eye pain, sore throat, cold symptoms) Disposition: [] ED /[] Urgent Care (no appt availability in office) / [] Appointment(In office/virtual)/ []  [facility]/ [] Home Care/ [] Refused Recommended Disposition /[] [facility]/ []  Follow-up with PCP Additional Notes: Patient states he has hx cluster headache- patient states PCP has been hesitant to treat with prednisone due to his diabetes- but he feels he needs dosing for this episode.

## 2021-08-08 NOTE — Telephone Encounter (Signed)
Summary: cluster headaches need medication   Patient called in to inquire of Dr Caryn Section if he can please refill the predniSONE 10 MG was requested on 08/03/21 for cluster headaches per patient that is the only thing that help with the symptoms Please call Ph# [phone]      Reason for Disposition  [1] Vomiting AND [2] 2 or more times (Exception: similar to previous migraines)  Answer Assessment - Initial Assessment Questions 1. LOCATION: "Where does it hurt?"      Always on R 2. ONSET: "When did the headache start?" (Minutes, hours or days)      Started 3 days ago 3. PATTERN: "Does the pain come and go, or has it been constant since it started?"     Comes and goes- not quick to go away 4. SEVERITY: "How bad is the pain?" and "What does it keep you from doing?"  (e.g., Scale 1-10; mild, moderate, or severe)   - MILD (1-3): doesn't interfere with normal activities    - MODERATE (4-7): interferes with normal activities or awakens from sleep    - SEVERE (8-10): excruciating pain, unable to do any normal activities        10 during headache 5. RECURRENT SYMPTOM: "Have you ever had headaches before?" If Yes, ask: "When was the last time?" and "What happened that time?"      Hx cluster headaches- it has been several years since last severe episode 6. CAUSE: "What do you think is causing the headache?"     Unsure what trigger is 7. MIGRAINE: "Have you been diagnosed with migraine headaches?" If Yes, ask: "Is this headache similar?"      no 8. HEAD INJURY: "Has there been any recent injury to the head?"      no 9. OTHER SYMPTOMS: "Do you have any other symptoms?" (fever, stiff neck, eye pain, sore throat, cold symptoms)     Cyclic vomiting- chronic 10. PREGNANCY: "Is there any chance you are pregnant?" "When was your last menstrual period?"       *No Answer*  Protocols used: Headache-A-AH

## 2021-08-09 MED ORDER — OXYCODONE-ACETAMINOPHEN 7.5-325 MG PO TABS: 1.0000 | ORAL_TABLET | ORAL | 0 refills | Status: DC | PRN

## 2021-08-10 ENCOUNTER — Telehealth: Payer: Self-pay | Admitting: Family Medicine

## 2021-08-10 ENCOUNTER — Telehealth: Payer: Self-pay

## 2021-08-10 NOTE — Telephone Encounter (Signed)
Copied from [place] [id number]. Topic: General - Other >> Aug 10, 2021 10:45 AM [place], Raymond Rose wrote: Reason for CRM: Pts wife was following up on MyChart messages the pt sent, pts wife is wanting Rose doctors note for work, please call back.

## 2021-08-10 NOTE — Telephone Encounter (Signed)
Pts wife called back in response to NT call to request appt for prednisone for cluster headaches. Pt wife declines appt for script for prednisone.

## 2021-08-10 NOTE — Telephone Encounter (Signed)
Summary: cluster headaches need medication   Pt called for an update on the refill request for prednisone. Pt requests call back [phone]     Attempted to call patient to schedule appointment- left message to call office for appointment.

## 2021-08-10 NOTE — Telephone Encounter (Signed)
That's fine

## 2021-08-10 NOTE — Telephone Encounter (Signed)
Call to patient- apologized for not having an answer for him regarding his medication question. Patient was very understanding.Appointment scheduled virtually (since patient will have to be back at work next week) to discus his request. Patient advised letter for work was approved by provider- per office ha can pick that up.

## 2021-08-13 ENCOUNTER — Telehealth (INDEPENDENT_AMBULATORY_CARE_PROVIDER_SITE_OTHER): Payer: 59 | Admitting: Family Medicine

## 2021-08-13 ENCOUNTER — Encounter: Payer: Self-pay | Admitting: Family Medicine

## 2021-08-13 ENCOUNTER — Other Ambulatory Visit: Payer: Self-pay

## 2021-08-13 DIAGNOSIS — G44019 Episodic cluster headache, not intractable: Secondary | ICD-10-CM

## 2021-08-13 MED ORDER — PREDNISONE 10 MG PO TABS: ORAL_TABLET | ORAL | 1 refills | Status: DC

## 2021-08-13 NOTE — Telephone Encounter (Signed)
Patient has an appt today with Dr. Caryn Section, see triage note. KW

## 2021-08-13 NOTE — Progress Notes (Signed)
MyChart Video Visit    Virtual Visit via Video Note   This visit type was conducted due to national recommendations for restrictions regarding the COVID-19 Pandemic (e.g. social distancing) in an effort to limit this patient's exposure and mitigate transmission in our community. This patient is at least at moderate risk for complications without adequate follow up. This format is felt to be most appropriate for this patient at this time. Physical exam was limited by quality of the video and audio technology used for the visit.   Patient location: home Provider location: bfp  I discussed the limitations of evaluation and management by telemedicine and the availability of in person appointments. The patient expressed understanding and agreed to proceed.  Patient: Raymond Rose   DOB: 06-Sep-1975   46 y.o. Male  MRN: [id number] Visit Date: 08/13/2021  Today's healthcare provider: [name], MD   Chief Complaint  Patient presents with   Headache   Subjective    Headache  This is a chronic problem. The current episode started more than 1 year ago. The problem has been gradually worsening. Associated symptoms include nausea and vomiting. Pertinent negatives include no abdominal pain or fever. His past medical history is significant for cluster headaches.   Patient reports that the only medication the helped with his cluster headaches in the past was prednisone.    Medications: Outpatient Medications Prior to Visit  Medication Sig   ALPRAZolam (XANAX) 0.5 MG tablet TAKE 1 TABLET BY MOUTH EVERY 4 HOURS AS NEEDE FOR ANXIETY   BD PEN NEEDLE NANO 2ND GEN 32G X 4 MM MISC USE AS DIRECTED 4 TIMES A DAY   blood glucose meter kit and supplies KIT Dispense based on patient and insurance preference. Use up to four times daily as directed. (FOR ICD-9 250.00, 250.01).   Continuous Blood Gluc Sensor (FREESTYLE LIBRE 14 DAY SENSOR) MISC SMARTSIG:1 Each Topical Every 2 Weeks   Dulaglutide  3 MG/0.5ML SOPN Inject 3 mg into the skin once a week.   Eszopiclone 3 MG TABS Take 1 tablet (3 mg total) by mouth at bedtime. Take immediately before bedtime   fluconazole (DIFLUCAN) 150 MG tablet TAKE 1 TABLET BY MOUTH AS ONE DOSE   hydrOXYzine (VISTARIL) 50 MG capsule Take 1-2 capsules (50-100 mg total) by mouth daily as needed.   insulin aspart (NOVOLOG FLEXPEN) 100 UNIT/ML FlexPen Inject up to 10 units three times daily as directed by physician   insulin detemir (LEVEMIR FLEXPEN) 100 UNIT/ML FlexPen Inject 46 Units into the skin daily.   ketoconazole (NIZORAL) 2 % cream APPLY TO AFFECTED AREA ONCE DAILY FOR 7 DAYS AS NEEDED (Patient taking differently: Apply 1 application topically daily. APPLY TO AFFECTED AREA ONCE DAILY FOR 7 DAYS AS NEEDED)   lamoTRIgine (LAMICTAL) 100 MG tablet Take 1.5 tablets (150 mg total) by mouth daily.   lithium carbonate 300 MG capsule Take 1-2 capsules (300-600 mg total) by mouth as directed. START TAKING 1 CAP DAILY AM AND 2 CAPS DAILY PM   metFORMIN (GLUCOPHAGE-XR) 500 MG 24 hr tablet TAKE 2 TABLETS BY MOUTH TWICE A DAY   omeprazole (PRILOSEC) 40 MG capsule TAKE 1 CAPSULE BY MOUTH EVERY DAY   oxyCODONE-acetaminophen (PERCOCET) 7.5-325 MG tablet Take 1 tablet by mouth every 4 (four) hours as needed.   prazosin (MINIPRESS) 2 MG capsule Take 1 capsule (2 mg total) by mouth at bedtime.   prochlorperazine (COMPAZINE) 10 MG tablet TAKE 1/2 - 1 TABLETS BY MOUTH EVERY 6 (SIX)  HOURS AS NEEDED FOR NAUSEA OR VOMITING.   rosuvastatin (CRESTOR) 20 MG tablet TAKE 1 TABLET BY MOUTH EVERY DAY   sildenafil (VIAGRA) 50 MG tablet Take 1-2 tablets (50-100 mg total) by mouth daily as needed.   tamsulosin (FLOMAX) 0.4 MG CAPS capsule Take 0.4 mg by mouth daily.   Vilazodone HCl (VIIBRYD) 40 MG TABS Take 1 tablet (40 mg total) by mouth daily.   No facility-administered medications prior to visit.    Review of Systems  Constitutional:  Negative for appetite change, chills and  fever.  Respiratory:  Negative for chest tightness, shortness of breath and wheezing.   Cardiovascular:  Negative for chest pain and palpitations.  Gastrointestinal:  Positive for nausea and vomiting. Negative for abdominal pain.  Neurological:  Positive for headaches.     Objective    There were no vitals taken for this visit.   Physical Exam   Awake, alert, oriented x 3. In no apparent distress   Assessment & Plan     1. Episodic cluster headache, not intractable Identical to prior cluster headaches which have always responded well to prednisone taper.  - predniSONE (DELTASONE) 10 MG tablet; TAKE 6 TABLETS FOR 2 DAYS, THEN 5 FOR 2 DAYS, THEN 4 FOR 2 DAYS, THEN 3 FOR 2 DAYS, THEN 2 FOR 2 DAYS, THEN 1 FOR 2 DAYS.  Dispense: 42 tablet; Refill: 1        I discussed the assessment and treatment plan with the patient. The patient was provided an opportunity to ask questions and all were answered. The patient agreed with the plan and demonstrated an understanding of the instructions.   The patient was advised to call back or seek an in-person evaluation if the symptoms worsen or if the condition fails to improve as anticipated.  I provided 7 minutes of non-face-to-face time during this encounter.  The entirety of the information documented in the History of Present Illness, Review of Systems and Physical Exam were personally obtained by me. Portions of this information were initially documented by the CMA and reviewed by me for thoroughness and accuracy.    [name], MD [facility] [phone] (phone) [phone] (fax)  [place]

## 2021-08-16 ENCOUNTER — Other Ambulatory Visit: Payer: Self-pay | Admitting: Family Medicine

## 2021-08-16 DIAGNOSIS — E1110 Type 2 diabetes mellitus with ketoacidosis without coma: Secondary | ICD-10-CM

## 2021-08-16 NOTE — Telephone Encounter (Signed)
Requested Prescriptions  ?Pending Prescriptions Disp Refills  ?? BD PEN NEEDLE NANO 2ND GEN 32G X 4 MM MISC [Pharmacy Med Name: BD NANO 2 GEN PEN NDL 32G 4MM] 100 each 3  ?  Sig: USE AS DIRECTED 4 TIMES A DAY  ?  ? Endocrinology: Diabetes - Testing Supplies Passed - 08/16/2021  2:07 PM  ?  ?  Passed - Valid encounter within last 12 months  ?  Recent Outpatient Visits   ?      ? 3 days ago Episodic cluster headache, not intractable  ? [facility] Birdie Sons, MD  ? 1 month ago Uncontrolled type 2 diabetes mellitus with hyperglycemia ([place])  ? [facility] Birdie Sons, MD  ? 3 months ago Uncontrolled type 2 diabetes mellitus with hyperglycemia ([place])  ? [facility] Birdie Sons, MD  ? 5 months ago Uncontrolled type 2 diabetes mellitus with hyperglycemia ([place])  ? [facility] Birdie Sons, MD  ? 8 months ago Tremor  ? [facility] Caryn Section, Kirstie Peri, MD  ?  ?  ?Future Appointments   ?        ? In 6 days Fisher, Kirstie Peri, MD [facility]  ?  ? ?  ?  ?  ? ? ?

## 2021-08-17 ENCOUNTER — Other Ambulatory Visit: Payer: Self-pay | Admitting: Family Medicine

## 2021-08-20 ENCOUNTER — Other Ambulatory Visit: Payer: Self-pay | Admitting: Family Medicine

## 2021-08-20 DIAGNOSIS — E119 Type 2 diabetes mellitus without complications: Secondary | ICD-10-CM

## 2021-08-21 MED ORDER — OXYCODONE-ACETAMINOPHEN 7.5-325 MG PO TABS: 1.0000 | ORAL_TABLET | ORAL | 0 refills | Status: DC | PRN

## 2021-08-22 ENCOUNTER — Ambulatory Visit: Payer: Self-pay | Admitting: Family Medicine

## 2021-08-23 ENCOUNTER — Telehealth: Payer: Self-pay

## 2021-08-23 NOTE — Telephone Encounter (Signed)
Copied from [place] [id number]. Topic: General - Other ?>> Aug 23, 2021  9:37 AM Camille Bal, Gerlene Burdock wrote: ?Reason for CRM:kpt trying to get Dr's note so he can go back to work tomorrow Please call back ?

## 2021-08-23 NOTE — Telephone Encounter (Signed)
I called and spoke with patient. He states he had another flare up of vomiting and missed work yesterday 08/22/2021, and today 08/23/2021. Patient wants to try to return to work tomorrow, but isn't sure if symptoms have resolved. Patient is requesting a note to return to work tomorrow, or Monday (if Dr. Caryn Section thinks he needs to be out another day). Please advise. Patient is aware that Dr. Caryn Section is out of the office today.  ?

## 2021-08-24 ENCOUNTER — Other Ambulatory Visit: Payer: Self-pay | Admitting: Family Medicine

## 2021-08-24 DIAGNOSIS — F419 Anxiety disorder, unspecified: Secondary | ICD-10-CM

## 2021-08-24 NOTE — Telephone Encounter (Signed)
Patient advised. Work note created and sent to [employer]. ?

## 2021-08-24 NOTE — Telephone Encounter (Signed)
Last refill: 07/10/2021 #30 with 1 refill ? ?Last office visit: 06/19/2021 in office ?Next office visit: 09/03/2021 ?

## 2021-08-24 NOTE — Telephone Encounter (Signed)
That's fine

## 2021-08-30 ENCOUNTER — Other Ambulatory Visit: Payer: Self-pay | Admitting: Family Medicine

## 2021-08-31 NOTE — Telephone Encounter (Signed)
Last refill: 08/21/2021 #60 with 0 refills ? ?Last office visit:04/20/2021-seen for chronic low back pain ?Next office visit: 09/03/2021 ?

## 2021-08-31 NOTE — Addendum Note (Signed)
Addended by: Randal Buba on: 08/31/2021 01:23 PM ? ? Modules accepted: Orders ? ?

## 2021-08-31 NOTE — Telephone Encounter (Signed)
?  Patient states he is out and would like a short supply of oxyCODONE-acetaminophen (PERCOCET) 7.5-325 MG tablet to hold him over until his 09/03/2021 appointment. Patient would like a follow up call today regarding the status ? ? ? ?[id number] [place]  [phone] ?Fax:  [phone] ?  ? ?

## 2021-09-03 ENCOUNTER — Other Ambulatory Visit: Payer: Self-pay

## 2021-09-03 ENCOUNTER — Ambulatory Visit (INDEPENDENT_AMBULATORY_CARE_PROVIDER_SITE_OTHER): Payer: 59 | Admitting: Family Medicine

## 2021-09-03 ENCOUNTER — Encounter: Payer: Self-pay | Admitting: Family Medicine

## 2021-09-03 VITALS — BP 142/87 | HR 82 | Ht 73.0 in | Wt 321.2 lb

## 2021-09-03 DIAGNOSIS — E1165 Type 2 diabetes mellitus with hyperglycemia: Secondary | ICD-10-CM | POA: Diagnosis not present

## 2021-09-03 DIAGNOSIS — G8929 Other chronic pain: Secondary | ICD-10-CM

## 2021-09-03 DIAGNOSIS — R112 Nausea with vomiting, unspecified: Secondary | ICD-10-CM

## 2021-09-03 DIAGNOSIS — M545 Low back pain, unspecified: Secondary | ICD-10-CM

## 2021-09-03 DIAGNOSIS — R197 Diarrhea, unspecified: Secondary | ICD-10-CM

## 2021-09-03 LAB — POCT GLYCOSYLATED HEMOGLOBIN (HGB A1C): Hemoglobin A1C: 10.7 % — AB (ref 4.0–5.6)

## 2021-09-03 MED ORDER — LEVEMIR FLEXPEN 100 UNIT/ML ~~LOC~~ SOPN: 54.0000 [IU] | PEN_INJECTOR | Freq: Every day | SUBCUTANEOUS | 11 refills | Status: DC

## 2021-09-03 MED ORDER — OXYCODONE-ACETAMINOPHEN 7.5-325 MG PO TABS: 1.0000 | ORAL_TABLET | ORAL | 0 refills | Status: DC | PRN

## 2021-09-03 NOTE — Progress Notes (Signed)
?  ?I,Raymond Rose,acting as a scribe for [name], MD.,have documented all relevant documentation on the behalf of [name], MD,as directed by  [name], MD while in the presence of [name], MD. ? ? ?Established patient visit ? ? ?Patient: Raymond Rose   DOB: March 13, 1976   46 y.o. Male  MRN: [id number] ?Visit Date: 09/03/2021 ? ?Today's healthcare provider: [name], MD  ? ?No chief complaint on file. ? ?Subjective  ?  ?HPI  ?Nausea and vomiting lately due to [facility] flare-up.  ?Diabetes Mellitus Type II, Follow-up ? ?Lab Results  ?Component Value Date  ? HGBA1C 13.5 (A) 06/19/2021  ? HGBA1C 11.7 (A) 04/20/2021  ? HGBA1C 10.9 (A) 03/05/2021  ? ?Wt Readings from Last 3 Encounters:  ?06/19/21 (!) 319 lb (144.7 kg)  ?04/20/21 (!) 325 lb (147.4 kg)  ?03/05/21 (!) 339 lb (153.8 kg)  ? ?Last seen for diabetes on 06/19/2021.   ?Management since then includes: Given 3 sample pens of Toujeo that he can substitute for Levemi. He expects his new insurance will cover insulin better. ?He reports excellent compliance with treatment. ?He is not having side effects.  ?Symptoms: ?No fatigue No foot ulcerations  ?No appetite changes Yes nausea  ?No paresthesia of the feet  No polydipsia  ?No polyuria No visual disturbances   ?Yes vomiting   ? ? ?Home blood sugar records: fasting range: 300 in the mornings, 225-250 throughout the day ? ?Episodes of hypoglycemia? No  ?  ?Current insulin regiment: 46 units Levemir daily and 10-12 units Novolog every day.  ?Most Recent Eye Exam: 1 year ago ?Current exercise: golfing once a week ?Current diet habits: diabetic ? ?Pertinent Labs: ?Lab Results  ?Component Value Date  ? CHOL 140 10/03/2020  ? HDL 37 (L) 10/03/2020  ? [place] 62 10/03/2020  ? TRIG 259 (H) 10/03/2020  ? CHOLHDL 3.8 10/03/2020  ? Lab Results  ?Component Value Date  ? NA 135 12/04/2020  ? K 4.7 12/04/2020  ? CREATININE 0.70 (L) 06/22/2021  ? EGFR 117 06/22/2021  ? MICROALBUR 20 03/05/2021  ?   ? ?---------------------------------------------------------------------------------------------------  ?He also report she has been having more episodes of cyclic vomiting syndrome lately. Denies persistent nausea, just paroxymal episodes causing him to be unable to work. Missed two days last week. Does not seem to be related to Trulicity. Had been on other GLP-1s in the past. And GI symptoms did not seem to change at all when started or discontinued.  ? ? ?Medications: ?Outpatient Medications Prior to Visit  ?Medication Sig  ? ALPRAZolam (XANAX) 0.5 MG tablet TAKE 1 TABLET BY MOUTH EVERY 4 HOURS AS NEEDE FOR ANXIETY  ? BD PEN NEEDLE NANO 2ND GEN 32G X 4 MM MISC USE AS DIRECTED 4 TIMES A DAY  ? blood glucose meter kit and supplies KIT Dispense based on patient and insurance preference. Use up to four times daily as directed. (FOR ICD-9 250.00, 250.01).  ? Dulaglutide 3 MG/0.5ML SOPN Inject 3 mg into the skin once a week.  ? Eszopiclone 3 MG TABS Take 1 tablet (3 mg total) by mouth at bedtime. Take immediately before bedtime  ? fluconazole (DIFLUCAN) 150 MG tablet TAKE 1 TABLET BY MOUTH AS ONE DOSE  ? hydrOXYzine (VISTARIL) 50 MG capsule Take 1-2 capsules (50-100 mg total) by mouth daily as needed.  ? insulin aspart (NOVOLOG FLEXPEN) 100 UNIT/ML FlexPen Inject up to 10 units three times daily as directed by physician  ? ketoconazole (NIZORAL) 2 %  cream APPLY TO AFFECTED AREA ONCE DAILY FOR 7 DAYS AS NEEDED (Patient taking differently: Apply 1 application. topically daily. APPLY TO AFFECTED AREA ONCE DAILY FOR 7 DAYS AS NEEDED)  ? lamoTRIgine (LAMICTAL) 100 MG tablet Take 1.5 tablets (150 mg total) by mouth daily.  ? lithium carbonate 300 MG capsule Take 1-2 capsules (300-600 mg total) by mouth as directed. START TAKING 1 CAP DAILY AM AND 2 CAPS DAILY PM  ? metFORMIN (GLUCOPHAGE-XR) 500 MG 24 hr tablet TAKE 2 TABLETS BY MOUTH TWICE A DAY  ? omeprazole (PRILOSEC) 40 MG capsule TAKE 1 CAPSULE BY MOUTH EVERY DAY  ?  prazosin (MINIPRESS) 2 MG capsule Take 1 capsule (2 mg total) by mouth at bedtime.  ? prochlorperazine (COMPAZINE) 10 MG tablet TAKE 1/2 - 1 TABLETS BY MOUTH EVERY 6 (SIX) HOURS AS NEEDED FOR NAUSEA OR VOMITING.  ? rosuvastatin (CRESTOR) 20 MG tablet TAKE 1 TABLET BY MOUTH EVERY DAY  ? sildenafil (VIAGRA) 50 MG tablet Take 1-2 tablets (50-100 mg total) by mouth daily as needed.  ? tamsulosin (FLOMAX) 0.4 MG CAPS capsule Take 0.4 mg by mouth daily.  ? Vilazodone HCl (VIIBRYD) 40 MG TABS Take 1 tablet (40 mg total) by mouth daily.  ? [DISCONTINUED] insulin detemir (LEVEMIR FLEXPEN) 100 UNIT/ML FlexPen Inject 46 Units into the skin daily.  ? [DISCONTINUED] oxyCODONE-acetaminophen (PERCOCET) 7.5-325 MG tablet Take 1 tablet by mouth every 4 (four) hours as needed.  ? Continuous Blood Gluc Sensor (FREESTYLE LIBRE 14 DAY SENSOR) MISC SMARTSIG:1 Each Topical Every 2 Weeks (Patient not taking: Reported on 09/03/2021)  ? [DISCONTINUED] predniSONE (DELTASONE) 10 MG tablet TAKE 6 TABLETS FOR 2 DAYS, THEN 5 FOR 2 DAYS, THEN 4 FOR 2 DAYS, THEN 3 FOR 2 DAYS, THEN 2 FOR 2 DAYS, THEN 1 FOR 2 DAYS.  ? ?No facility-administered medications prior to visit.  ? ? ? ? ?  Objective  ?  ?BP (!) 142/87 (BP Location: Left Arm, Patient Position: Sitting, Cuff Size: Normal)   Pulse 82   Ht $R'6\' 1"'RT$  (1.854 m)   Wt (!) 321 lb 3.2 oz (145.7 kg)   SpO2 98%   BMI 42.38 kg/m?  ? ? ?Physical Exam  ?General appearance: Obese male, cooperative and in no acute distress ?Head: Normocephalic, without obvious abnormality, atraumatic ?Respiratory: Respirations even and unlabored, normal respiratory rate ?Extremities: All extremities are intact.  ?Skin: Skin color, texture, turgor normal. No rashes seen  ?Psych: Appropriate mood and affect. ?Neurologic: Mental status: Alert, oriented to person, place, and time, thought content appropriate.  ? ?Results for orders placed or performed in visit on 09/03/21  ?POCT HgB A1C  ?Result Value Ref Range  ? Hemoglobin  A1C 10.7 (A) 4.0 - 5.6 %  ? HbA1c POC (<> result, manual entry)    ? HbA1c, POC (prediabetic range)    ? HbA1c, POC (controlled diabetic range)    ? ? Assessment & Plan  ?  ? ? ?1. Uncontrolled type 2 diabetes mellitus with hyperglycemia ([place]) ? ?- Urine Albumin-Creatinine with uACR ? ?H1c slightly improved, fasting still uncontrolled, will increase- insulin detemir (LEVEMIR FLEXPEN) 100 UNIT/ML FlexPen; to 54 Units into the skin daily.  Dispense: 15 mL; Refill: 11 ? ?He does have recurrent n/v/d and likely cyclic vomiting syndrome, but this does seem to get any better or worse with GLP-1 use and discontinuation, so will continue current dose of dulaglutide.  ? ?2. Nausea vomiting and diarrhea ?Advise to follow up with GI as this has been  worsening. Work excuse given for 3-16 and 3-17 ? ?3. Chronic low back pain, unspecified back pain laterality, unspecified whether sciatica present ?Refill- oxyCODONE-acetaminophen (PERCOCET) 7.5-325 MG tablet; Take 1 tablet by mouth every 4 (four) hours as needed.  Dispense: 60 tablet; Refill: 0  ?   ? ?The entirety of the information documented in the History of Present Illness, Review of Systems and Physical Exam were personally obtained by me. Portions of this information were initially documented by the CMA and reviewed by me for thoroughness and accuracy.   ? ? ?[name], MD  ?[facility] ?[phone] (phone) ?[phone] (fax) ? ?[facility]  ?

## 2021-09-04 ENCOUNTER — Other Ambulatory Visit: Payer: Self-pay | Admitting: Family Medicine

## 2021-09-04 DIAGNOSIS — N529 Male erectile dysfunction, unspecified: Secondary | ICD-10-CM

## 2021-09-04 LAB — MICROALBUMIN / CREATININE URINE RATIO
Creatinine, Urine: 66 mg/dL
Microalb/Creat Ratio: 10 mg/g creat (ref 0–29)
Microalbumin, Urine: 6.7 ug/mL

## 2021-09-04 LAB — SPECIMEN STATUS REPORT

## 2021-09-05 NOTE — Telephone Encounter (Signed)
Requested medication (s) are due for refill today: Yes ? ?Requested medication (s) are on the active medication list: Yes ? ?Last refill:  02/22/20 ? ?Future visit scheduled: Yes ? ?Notes to clinic:  Unable to refill per protocol, no recent notes in chart about patient using this medication, Rx expired ? ? ? ? ? ?Requested Prescriptions  ?Pending Prescriptions Disp Refills  ? sildenafil (VIAGRA) 50 MG tablet [Pharmacy Med Name: SILDENAFIL 50 MG TABLET] 90 tablet 3  ?  Sig: TAKE 1 TO 2 TABLETS BY MOUTH DAILY AS NEEDED  ?  ? Urology: Erectile Dysfunction Agents Failed - 09/04/2021 10:20 AM  ?  ?  Failed - Last BP in normal range  ?  BP Readings from Last 1 Encounters:  ?09/03/21 (!) 142/87  ?  ?  ?  ?  Passed - AST in normal range and within 360 days  ?  AST  ?Date Value Ref Range Status  ?12/04/2020 31 0 - 40 IU/L Final  ? ?SGOT(AST)  ?Date Value Ref Range Status  ?09/07/2014 29 U/L Final  ?  Comment:  ?  15-41 ?NOTE: New Reference Range ? 08/23/14 ?  ?  ?  ?  ?  Passed - ALT in normal range and within 360 days  ?  ALT  ?Date Value Ref Range Status  ?12/04/2020 40 0 - 44 IU/L Final  ? ?SGPT (ALT)  ?Date Value Ref Range Status  ?09/07/2014 33 U/L Final  ?  Comment:  ?  17-63 ?NOTE: New Reference Range ? 08/23/14 ?  ?  ?  ?  ?  Passed - Valid encounter within last 12 months  ?  Recent Outpatient Visits   ? ?      ? 2 days ago Uncontrolled type 2 diabetes mellitus with hyperglycemia ([place])  ? [facility] Birdie Sons, MD  ? 3 weeks ago Episodic cluster headache, not intractable  ? [facility] Birdie Sons, MD  ? 2 months ago Uncontrolled type 2 diabetes mellitus with hyperglycemia ([place])  ? [facility] Birdie Sons, MD  ? 4 months ago Uncontrolled type 2 diabetes mellitus with hyperglycemia ([place])  ? [facility] Birdie Sons, MD  ? 6 months ago Uncontrolled type 2 diabetes mellitus with hyperglycemia ([place])  ? [facility] Caryn Section, Kirstie Peri, MD  ? ?  ?  ?Future Appointments   ? ?        ? In 2 months Fisher, Kirstie Peri, MD [facility]  ? ?  ? ?  ?  ?  ? ? ? ? ?

## 2021-09-09 ENCOUNTER — Other Ambulatory Visit: Payer: Self-pay | Admitting: Family Medicine

## 2021-09-09 DIAGNOSIS — R112 Nausea with vomiting, unspecified: Secondary | ICD-10-CM

## 2021-09-09 DIAGNOSIS — G44019 Episodic cluster headache, not intractable: Secondary | ICD-10-CM

## 2021-09-11 ENCOUNTER — Other Ambulatory Visit: Payer: Self-pay | Admitting: Family Medicine

## 2021-09-11 ENCOUNTER — Encounter: Payer: Self-pay | Admitting: Family Medicine

## 2021-09-11 ENCOUNTER — Ambulatory Visit: Payer: 59 | Admitting: Licensed Clinical Social Worker

## 2021-09-11 NOTE — Telephone Encounter (Signed)
[place] for work note  09/04/2021 through 09/07/2021 ?

## 2021-09-12 ENCOUNTER — Other Ambulatory Visit: Payer: Self-pay | Admitting: Family Medicine

## 2021-09-12 MED ORDER — OXYCODONE-ACETAMINOPHEN 7.5-325 MG PO TABS: 1.0000 | ORAL_TABLET | ORAL | 0 refills | Status: DC | PRN

## 2021-09-13 ENCOUNTER — Other Ambulatory Visit: Payer: Self-pay | Admitting: Family Medicine

## 2021-09-16 MED ORDER — OXYCODONE-ACETAMINOPHEN 7.5-325 MG PO TABS: 1.0000 | ORAL_TABLET | ORAL | 0 refills | Status: DC | PRN

## 2021-09-18 ENCOUNTER — Other Ambulatory Visit: Payer: Self-pay | Admitting: Family Medicine

## 2021-09-18 NOTE — Telephone Encounter (Signed)
Pt called in states in all out of insulin aspart (NOVOLOG FLEXPEN) 100 UNIT/ML FlexPen . He is asking for short supply until refill comes in.  ?[id number] [place]  [phone] ?Fax:  [phone] ?  ? ?

## 2021-09-19 MED ORDER — NOVOLOG FLEXPEN 100 UNIT/ML ~~LOC~~ SOPN: PEN_INJECTOR | SUBCUTANEOUS | 5 refills | Status: DC

## 2021-09-19 NOTE — Telephone Encounter (Signed)
Requested Prescriptions  ?Pending Prescriptions Disp Refills  ?? insulin aspart (NOVOLOG FLEXPEN) 100 UNIT/ML FlexPen 15 mL 5  ?  Sig: Inject up to 10 units three times daily as directed by physician  ?  ? Endocrinology:  Diabetes - Insulins Failed - 09/18/2021 11:53 AM  ?  ?  Failed - HBA1C is between 0 and 7.9 and within 180 days  ?  Hemoglobin A1C  ?Date Value Ref Range Status  ?09/03/2021 10.7 (A) 4.0 - 5.6 % Final  ? ?Hgb A1c MFr Bld  ?Date Value Ref Range Status  ?01/23/2020 10.1 (H) 4.8 - 5.6 % Final  ?  Comment:  ?  (NOTE) ?Pre diabetes:          5.7%-6.4% ? ?Diabetes:              >6.4% ? ?Glycemic control for   <7.0% ?adults with diabetes ?  ?   ?  ?  Passed - Valid encounter within last 6 months  ?  Recent Outpatient Visits   ?      ? 2 weeks ago Uncontrolled type 2 diabetes mellitus with hyperglycemia ([place])  ? [facility] Birdie Sons, MD  ? 1 month ago Episodic cluster headache, not intractable  ? [facility] Birdie Sons, MD  ? 3 months ago Uncontrolled type 2 diabetes mellitus with hyperglycemia ([place])  ? [facility] Birdie Sons, MD  ? 5 months ago Uncontrolled type 2 diabetes mellitus with hyperglycemia ([place])  ? [facility] Birdie Sons, MD  ? 6 months ago Uncontrolled type 2 diabetes mellitus with hyperglycemia ([place])  ? [facility] Caryn Section, Kirstie Peri, MD  ?  ?  ?Future Appointments   ?        ? In 1 month Fisher, Kirstie Peri, MD [facility]  ?  ? ?  ?  ?  ? ? ?

## 2021-09-24 ENCOUNTER — Encounter: Payer: Self-pay | Admitting: Family Medicine

## 2021-09-26 ENCOUNTER — Other Ambulatory Visit: Payer: Self-pay | Admitting: Family Medicine

## 2021-09-26 MED ORDER — OXYCODONE-ACETAMINOPHEN 7.5-325 MG PO TABS: 1.0000 | ORAL_TABLET | ORAL | 0 refills | Status: DC | PRN

## 2021-09-26 NOTE — Telephone Encounter (Signed)
That's fine, he can have a medical work excuse for those dates.  ?

## 2021-09-27 ENCOUNTER — Telehealth: Payer: Self-pay | Admitting: Family Medicine

## 2021-09-27 NOTE — Telephone Encounter (Signed)
Requested medication (s) are due for refill today:   Provider to review ? ?Requested medication (s) are on the active medication list:   Yes ? ?Future visit scheduled:   Yes ? ? ?Last ordered: 09/26/2021 #60, 0 refills ? ?Returned because it was sent to the wrong pharmacy yesterday.   See attached note.   Non delegated refill  ? ?Requested Prescriptions  ?Pending Prescriptions Disp Refills  ? oxyCODONE-acetaminophen (PERCOCET) 7.5-325 MG tablet 60 tablet 0  ?  Sig: Take 1 tablet by mouth every 4 (four) hours as needed.  ?  ? Not Delegated - Analgesics:  Opioid Agonist Combinations Failed - 09/27/2021  2:42 PM  ?  ?  Failed - This refill cannot be delegated  ?  ?  Failed - Urine Drug Screen completed in last 360 days  ?  ?  Passed - Valid encounter within last 3 months  ?  Recent Outpatient Visits   ? ?      ? 3 weeks ago Uncontrolled type 2 diabetes mellitus with hyperglycemia ([place])  ? [facility] Birdie Sons, MD  ? 1 month ago Episodic cluster headache, not intractable  ? [facility] Birdie Sons, MD  ? 3 months ago Uncontrolled type 2 diabetes mellitus with hyperglycemia ([place])  ? [facility] Birdie Sons, MD  ? 5 months ago Uncontrolled type 2 diabetes mellitus with hyperglycemia ([place])  ? [facility] Birdie Sons, MD  ? 6 months ago Uncontrolled type 2 diabetes mellitus with hyperglycemia ([place])  ? [facility] Caryn Section, Kirstie Peri, MD  ? ?  ?  ?Future Appointments   ? ?        ? In 1 month Fisher, Kirstie Peri, MD [facility]  ? ?  ? ?  ?  ?  ? ?

## 2021-09-27 NOTE — Telephone Encounter (Signed)
Requested medication (s) are due for refill today:   Provider to review ? ?Requested medication (s) are on the active medication list:   Yes ? ?Future visit scheduled:   No ? ? ?Last ordered: 09/26/2021 #60, 0 refills ? ?Non delegated refill (2 requests got entered in case you see another request come through).  ? ?Requested Prescriptions  ?Pending Prescriptions Disp Refills  ? oxyCODONE-acetaminophen (PERCOCET) 7.5-325 MG tablet 60 tablet 0  ?  Sig: Take 1 tablet by mouth every 4 (four) hours as needed.  ?  ? Not Delegated - Analgesics:  Opioid Agonist Combinations Failed - 09/27/2021  2:42 PM  ?  ?  Failed - This refill cannot be delegated  ?  ?  Failed - Urine Drug Screen completed in last 360 days  ?  ?  Passed - Valid encounter within last 3 months  ?  Recent Outpatient Visits   ? ?      ? 3 weeks ago Uncontrolled type 2 diabetes mellitus with hyperglycemia ([place])  ? [facility] Birdie Sons, MD  ? 1 month ago Episodic cluster headache, not intractable  ? [facility] Birdie Sons, MD  ? 3 months ago Uncontrolled type 2 diabetes mellitus with hyperglycemia ([place])  ? [facility] Birdie Sons, MD  ? 5 months ago Uncontrolled type 2 diabetes mellitus with hyperglycemia ([place])  ? [facility] Birdie Sons, MD  ? 6 months ago Uncontrolled type 2 diabetes mellitus with hyperglycemia ([place])  ? [facility] Caryn Section, Kirstie Peri, MD  ? ?  ?  ?Future Appointments   ? ?        ? In 1 month Fisher, Kirstie Peri, MD [facility]  ? ?  ? ?  ?  ?  ? ?

## 2021-09-27 NOTE — Telephone Encounter (Signed)
Medication Problem: oxyCODONE-acetaminophen (PERCOCET) 7.5-325 MG tablet [[id number]]  ? ?Pt called and stated that medication is out at [place] and would like to know if it can be sent to:  ? ?[facility] [address] [place] - [place], [place] [employer] [address] Phone:  [phone]  ?Fax:  [phone]  ?  ? ?

## 2021-09-27 NOTE — Telephone Encounter (Signed)
Pt is calling to request if his oxyCODONE-acetaminophen (PERCOCET) 7.5-325 MG tablet [[id number]] can be sent to  ? ?[facility] [address] [facility] - [place], [place] - [address] AT [address] ?[place] [address] ?Phone: [phone] Fax: [phone] ?Hours: Not open 24 hours ? ?Script was sent to the wrong pharmacy ?

## 2021-09-28 ENCOUNTER — Encounter: Payer: Self-pay | Admitting: Family Medicine

## 2021-09-28 ENCOUNTER — Ambulatory Visit: Payer: Self-pay | Admitting: *Deleted

## 2021-09-28 NOTE — Telephone Encounter (Signed)
Pt stated [facility] does not have the oxyCODONE-acetaminophen (PERCOCET) 7.5-325 MG tablet  so he requested that it be sent to [facility] but they have yet to receive the Rx. Pt requests that the Rx be sent to [facility] asap. ?

## 2021-09-28 NOTE — Telephone Encounter (Signed)
Opened chart to answer question for agent regarding Oxycodone rx.   Pt wanted it sent to [facility] of [facility].  [facility] still doesn't have the rx. ? ?Looking in his chart I see where Dr. Brita Romp has refused the  oxycodone this morning.  There is a note from office staff Jennings Books, CMA asking Dr. Brita Romp to authorize and send it to [facility].  She will cancel the [facility] at [facility] once it's authorized by Dr. Brita Romp. ? ?Had agent send request to practice since it's a non delegated medication and they are waiting on Dr. Nancy Nordmann response.   ?

## 2021-10-01 MED ORDER — OXYCODONE-ACETAMINOPHEN 7.5-325 MG PO TABS: 1.0000 | ORAL_TABLET | ORAL | 0 refills | Status: DC | PRN

## 2021-10-08 ENCOUNTER — Telehealth: Payer: Self-pay

## 2021-10-08 ENCOUNTER — Other Ambulatory Visit: Payer: Self-pay | Admitting: Family Medicine

## 2021-10-08 NOTE — Telephone Encounter (Signed)
called pt back she stated the emailed it to the [email or web address] eamail ?

## 2021-10-08 NOTE — Telephone Encounter (Signed)
pt called asked if you got a ADA paperwork to fill out for him.  He wanted to know if you received and had filled out ?

## 2021-10-08 NOTE — Telephone Encounter (Signed)
I have not received anything for him. ?Also he was released from my care . ?Please let patient know. ?

## 2021-10-08 NOTE — Telephone Encounter (Signed)
OK noted , thank you. ?If anyone has access please let me know. ? ?

## 2021-10-09 ENCOUNTER — Ambulatory Visit (INDEPENDENT_AMBULATORY_CARE_PROVIDER_SITE_OTHER): Payer: 59 | Admitting: Family Medicine

## 2021-10-09 ENCOUNTER — Encounter: Payer: Self-pay | Admitting: Family Medicine

## 2021-10-09 ENCOUNTER — Telehealth: Payer: Self-pay

## 2021-10-09 VITALS — BP 123/85 | HR 95 | Temp 98.0°F | Resp 16 | Ht 73.0 in | Wt 317.6 lb

## 2021-10-09 DIAGNOSIS — Z79899 Other long term (current) drug therapy: Secondary | ICD-10-CM

## 2021-10-09 DIAGNOSIS — F418 Other specified anxiety disorders: Secondary | ICD-10-CM | POA: Diagnosis not present

## 2021-10-09 DIAGNOSIS — F4010 Social phobia, unspecified: Secondary | ICD-10-CM

## 2021-10-09 DIAGNOSIS — Z91199 Patient's noncompliance with other medical treatment and regimen due to unspecified reason: Secondary | ICD-10-CM

## 2021-10-09 DIAGNOSIS — F331 Major depressive disorder, recurrent, moderate: Secondary | ICD-10-CM

## 2021-10-09 DIAGNOSIS — F411 Generalized anxiety disorder: Secondary | ICD-10-CM

## 2021-10-09 DIAGNOSIS — F5105 Insomnia due to other mental disorder: Secondary | ICD-10-CM

## 2021-10-09 MED ORDER — HYDROXYZINE PAMOATE 50 MG PO CAPS: 50.0000 mg | ORAL_CAPSULE | Freq: Every day | ORAL | 0 refills | Status: DC | PRN

## 2021-10-09 MED ORDER — VILAZODONE HCL 40 MG PO TABS: 40.0000 mg | ORAL_TABLET | Freq: Every day | ORAL | 0 refills | Status: DC

## 2021-10-09 MED ORDER — PRAZOSIN HCL 2 MG PO CAPS: 2.0000 mg | ORAL_CAPSULE | Freq: Every day | ORAL | 0 refills | Status: DC

## 2021-10-09 MED ORDER — LAMOTRIGINE 100 MG PO TABS: 150.0000 mg | ORAL_TABLET | Freq: Every day | ORAL | 0 refills | Status: DC

## 2021-10-09 MED ORDER — LITHIUM CARBONATE 300 MG PO CAPS: 300.0000 mg | ORAL_CAPSULE | ORAL | 0 refills | Status: DC

## 2021-10-09 NOTE — Telephone Encounter (Signed)
Medication management - Telephone call from patient questioning if Dr. Shea Evans had received ADA accommodation paperwork he needed to be completed by 10/17/21 for his job.  Patient emailed forms and agreed to send to Dr. Shea Evans for review as patient stated the office he was referred to would not complete the forms since they had not know patient very long.  ?

## 2021-10-09 NOTE — Telephone Encounter (Signed)
Medication management - Telephone call with patient, after speaking with Dr. Shea Evans who did complete requested accommodation paperwork for patient from [employer].  Informed pt he could come by our office to sign a consent for forms to be faxed or the forms could be scanned back to his e-mail [email or web address] and then he could take forms to whomever decides to share.  Patient requested the forms be scanned and emailed back to his personal e-mail and forms sent as requested.  Patient agreed to call back to set up next appointment with Dr. Shea Evans and if anything else needed for his work accommodation.   ?

## 2021-10-09 NOTE — Telephone Encounter (Signed)
Contacted this patient to discuss his concerns. ? ?Based on his discussion with writer on 08/01/2021-patient had discussed with Probation officer that he was not happy with the current medication regimen.  Patient wanted to pursue alternate treatment options at that visit.  It was discussed with patient that day that I am going to release him from my care and he was agreeable. ? ?A letter was printed out which is also available on my chart regarding this. ? ?Patient however today reports that he he will lose his job if Probation officer does not complete his ADA form.  Patient reports he is going to start [place] treatment at [facility] next week.  In the meantime he wants to continue his current medications.  Patient reports he also is agreeable to continue psychotherapy sessions. ? ?Patient reports he would like to return to be under providers care as well as the therapist care at our practice. ? ?Patient provided education about the fact that he needs to be compliant with treatment recommendations to continue to be under our care. ? ?Patient agreeable with plan. ? ?I have completed ADA form.  Dated 10/09/2021 - 04/15/2022. ? ?I will send a message to staff to contact this patient to schedule an appointment for follow-up as soon as possible. ? ?I will go ahead and refill his medications in the meantime. ? ?I will also communicate with Ms. Christina Hussami regarding this patient regarding continuing therapy. ? ?Mr. Beather Arbour to contact patient with completed ADA form. ? ? ?

## 2021-10-09 NOTE — Progress Notes (Signed)
?  ? ?I,Sulibeya S Dimas,acting as a scribe for [name], MD.,have documented all relevant documentation on the behalf of [name], MD,as directed by  [name], MD while in the presence of [name], MD. ? ? ?Established patient visit ? ? ?Patient: Raymond Rose   DOB: 1975/11/08   46 y.o. Male  MRN: [id number] ?Visit Date: 10/09/2021 ? ?Today's healthcare provider: [name], MD  ? ?Chief Complaint  ?Patient presents with  ? Depression  ? ?Subjective  ?  ?HPI  ?Follow up for depression ? ?The patient was last seen for this 2 months ago. ?Changes made at last visit include referral for new treatment at [facility] by Dr. Shea Evans. ? ?Patient requesting forms to be filled out for time off for treatment.  His psychiatrist is anticipating ketamine treatments which will required his absence from work on days of treatment. Is expected to start the first week of May. Treatments are expected be two days a week for the first month, then one day a week for an additional 1-2 months.  ? ? ?  10/09/2021  ?  9:45 AM 08/01/2021  ? 11:44 AM 06/20/2021  ? 11:23 AM 03/14/2021  ?  2:34 PM 03/05/2021  ?  8:41 AM  ?Depression screen PHQ 2/9  ?Decreased Interest 3 1 2  0 0  ?Down, Depressed, Hopeless 3 3 2  0 0  ?PHQ - 2 Score 6 4 4  0 0  ?Altered sleeping 3 2 2  0 0  ?Tired, decreased energy 3 0 2 0 1  ?Change in appetite 2 1 2  0 1  ?Feeling bad or failure about yourself  3 2 2  0 0  ?Trouble concentrating 3 0 0 0 0  ?Moving slowly or fidgety/restless 0 2 0 0 0  ?Suicidal thoughts 3 1 2 1  0  ?PHQ-9 Score 23 12 14 1 2   ?Difficult doing work/chores Very difficult Not difficult at all Very difficult Not difficult at all Not difficult at all  ? ? ? ?----------------------------------------------------------------------------------------- ? ? ?Medications: ?Outpatient Medications Prior to Visit  ?Medication Sig  ? ALPRAZolam (XANAX) 0.5 MG tablet TAKE 1 TABLET BY MOUTH EVERY 4 HOURS AS NEEDE FOR ANXIETY  ? BD PEN NEEDLE  NANO 2ND GEN 32G X 4 MM MISC USE AS DIRECTED 4 TIMES A DAY  ? blood glucose meter kit and supplies KIT Dispense based on patient and insurance preference. Use up to four times daily as directed. (FOR ICD-9 250.00, 250.01).  ? Dulaglutide 3 MG/0.5ML SOPN Inject 3 mg into the skin once a week.  ? Eszopiclone 3 MG TABS Take 1 tablet (3 mg total) by mouth at bedtime. Take immediately before bedtime  ? fluconazole (DIFLUCAN) 150 MG tablet TAKE 1 TABLET BY MOUTH AS ONE DOSE  ? hydrOXYzine (VISTARIL) 50 MG capsule Take 1-2 capsules (50-100 mg total) by mouth daily as needed.  ? insulin aspart (NOVOLOG FLEXPEN) 100 UNIT/ML FlexPen Inject up to 10 units three times daily as directed by physician (Patient taking differently: Inject up to 20 units three times daily as directed by physician)  ? insulin detemir (LEVEMIR FLEXPEN) 100 UNIT/ML FlexPen Inject 54 Units into the skin daily.  ? ketoconazole (NIZORAL) 2 % cream APPLY TO AFFECTED AREA ONCE DAILY FOR 7 DAYS AS NEEDED (Patient taking differently: Apply 1 application. topically daily. APPLY TO AFFECTED AREA ONCE DAILY FOR 7 DAYS AS NEEDED)  ? lamoTRIgine (LAMICTAL) 100 MG tablet Take 1.5 tablets (150 mg total) by mouth daily.  ?  lithium carbonate 300 MG capsule Take 1-2 capsules (300-600 mg total) by mouth as directed. START TAKING 1 CAP DAILY AM AND 2 CAPS DAILY PM  ? metFORMIN (GLUCOPHAGE-XR) 500 MG 24 hr tablet TAKE 2 TABLETS BY MOUTH TWICE A DAY  ? omeprazole (PRILOSEC) 40 MG capsule TAKE 1 CAPSULE BY MOUTH EVERY DAY  ? oxyCODONE-acetaminophen (PERCOCET) 7.5-325 MG tablet Take 1 tablet by mouth every 4 (four) hours as needed.  ? prazosin (MINIPRESS) 2 MG capsule Take 1 capsule (2 mg total) by mouth at bedtime.  ? prochlorperazine (COMPAZINE) 10 MG tablet TAKE 1/2 - 1 TABLETS BY MOUTH EVERY 6 (SIX) HOURS AS NEEDED FOR NAUSEA OR VOMITING.  ? rosuvastatin (CRESTOR) 20 MG tablet TAKE 1 TABLET BY MOUTH EVERY DAY  ? sildenafil (VIAGRA) 50 MG tablet TAKE 1 TO 2 TABLETS BY  MOUTH DAILY AS NEEDED  ? tamsulosin (FLOMAX) 0.4 MG CAPS capsule Take 0.4 mg by mouth daily.  ? Vilazodone HCl (VIIBRYD) 40 MG TABS Take 1 tablet (40 mg total) by mouth daily.  ? [DISCONTINUED] Continuous Blood Gluc Sensor (FREESTYLE LIBRE 14 DAY SENSOR) MISC SMARTSIG:1 Each Topical Every 2 Weeks (Patient not taking: Reported on 09/03/2021)  ? ?No facility-administered medications prior to visit.  ? ? ?Review of Systems  ?Constitutional:  Positive for activity change, appetite change and fatigue.  ?Respiratory:  Negative for shortness of breath.   ?Cardiovascular:  Negative for chest pain and palpitations.  ?Psychiatric/Behavioral:  Positive for decreased concentration, dysphoric mood, sleep disturbance and suicidal ideas. The patient is nervous/anxious.   ? ? ?  Objective  ?  ?BP 123/85 (BP Location: Left Arm, Patient Position: Sitting, Cuff Size: Large)   Pulse 95   Temp 98 ?F (36.7 ?C) (Oral)   Resp 16   Ht $R'6\' 1"'jV$  (1.854 m)   Wt (!) 317 lb 9.6 oz (144.1 kg)   BMI 41.90 kg/m?  ? ? ? ? Assessment & Plan  ?  ? ?1. Depression with anxiety ?Anticipating requiring absence from work 2 days a week for 4 weeks, then 1 day a week for 1-2 months for ketamine treatments at [facility]. Patient left work accomodation forms to be completed for his employers. Patient will need to let me know date that treatments will begin so I can complete forms.  ?25 minutes required reviewing his medical record, counseling patient regarding his conditions and coordination of care related to today's visit.     ?   ? ?The entirety of the information documented in the History of Present Illness, Review of Systems and Physical Exam were personally obtained by me. Portions of this information were initially documented by the CMA and reviewed by me for thoroughness and accuracy.   ? ? ?[name], MD  ?[facility] ?[phone] (phone) ?[phone] (fax) ? ?[facility] ?

## 2021-10-09 NOTE — Telephone Encounter (Signed)
Medication management - Telephone call with patient to inform patient his new provider would need to fill out ADA acccomodation forms for him since they have taken over his care and he would not be returning to see Dr. Shea Evans.  Patient was not in agreement with this as he stated Dr. Shea Evans "is still my provider" and that he was just going to them for [facility] treatment.  Patient stated he felt Dr. Shea Evans should be willing to assist with forms since she has been working with him over the past few years but reminded patient he would not be returning to treatment with Dr. Shea Evans, no current appointments and had been released to other services, which patient stated he still did not agree with and requested to speak to Dr. Shea Evans as patient feel he would still keep Dr. Shea Evans as his psychiatrist even with other treatment being provided by another agency.  Patient requested Dr. Shea Evans call him to discuss and agreed to send message to provider.  ?

## 2021-10-09 NOTE — Telephone Encounter (Signed)
Let me take a look at the form and will make a decision. ?Like I mentioned he was released from my care. ?

## 2021-10-10 ENCOUNTER — Other Ambulatory Visit: Payer: Self-pay | Admitting: Psychiatry

## 2021-10-10 DIAGNOSIS — F330 Major depressive disorder, recurrent, mild: Secondary | ICD-10-CM | POA: Diagnosis not present

## 2021-10-10 DIAGNOSIS — Z91199 Patient's noncompliance with other medical treatment and regimen due to unspecified reason: Secondary | ICD-10-CM

## 2021-10-10 NOTE — Telephone Encounter (Signed)
Medication mangement - Telephone call from patient stating he was unable to open the attachment from the [facility] email sent to him 10/09/21 with Dr. Charlcie Cradle completed [employer] Accommodation paperwork. Agreed to fax forms to his requested home fax number [phone].  Verified this fax number a second time with patient and then faxed a copy of Dr.Eappen's completed forms.  Patient to call back if any other issues with the forms or concerns.  ?

## 2021-10-11 ENCOUNTER — Ambulatory Visit: Payer: 59 | Admitting: Psychiatry

## 2021-10-11 MED ORDER — OXYCODONE-ACETAMINOPHEN 7.5-325 MG PO TABS: 1.0000 | ORAL_TABLET | ORAL | 0 refills | Status: DC | PRN

## 2021-10-12 ENCOUNTER — Encounter: Payer: Self-pay | Admitting: Family Medicine

## 2021-10-12 ENCOUNTER — Telehealth: Payer: Self-pay

## 2021-10-12 ENCOUNTER — Ambulatory Visit: Payer: 59 | Admitting: Family Medicine

## 2021-10-12 ENCOUNTER — Encounter: Payer: Self-pay | Admitting: Psychiatry

## 2021-10-12 ENCOUNTER — Ambulatory Visit (INDEPENDENT_AMBULATORY_CARE_PROVIDER_SITE_OTHER): Payer: 59 | Admitting: Psychiatry

## 2021-10-12 VITALS — BP 106/72 | HR 81 | Temp 97.9°F | Wt 316.6 lb

## 2021-10-12 DIAGNOSIS — F331 Major depressive disorder, recurrent, moderate: Secondary | ICD-10-CM | POA: Diagnosis not present

## 2021-10-12 DIAGNOSIS — F411 Generalized anxiety disorder: Secondary | ICD-10-CM

## 2021-10-12 DIAGNOSIS — Z79899 Other long term (current) drug therapy: Secondary | ICD-10-CM

## 2021-10-12 DIAGNOSIS — F4010 Social phobia, unspecified: Secondary | ICD-10-CM

## 2021-10-12 DIAGNOSIS — F5105 Insomnia due to other mental disorder: Secondary | ICD-10-CM | POA: Diagnosis not present

## 2021-10-12 DIAGNOSIS — F1290 Cannabis use, unspecified, uncomplicated: Secondary | ICD-10-CM | POA: Insufficient documentation

## 2021-10-12 DIAGNOSIS — Z9189 Other specified personal risk factors, not elsewhere classified: Secondary | ICD-10-CM

## 2021-10-12 NOTE — Patient Instructions (Signed)
Please call for EKG - [phone]     Cannabis Use Disorder Cannabis use disorder occurs when marijuana use disrupts a person's daily life or causes health problems. This condition can be dangerous. The health problems this condition can cause include: Long-lasting problems with thinking and learning. These can be permanent in young people. Mental health problems, such as severe anxiety, paranoia, hallucinations, or schizophrenia. Dangerously high blood pressure and heart rate. Breathing problems. Problems with child development during and after pregnancy. People with this condition are also more likely to use other drugs. What are the causes? This condition is caused by using marijuana too much over time. It is not caused by using it only once in a while. Many people with this condition use marijuana because it gives them a feeling of extreme pleasure or relaxation. What increases the risk? This condition is more likely to develop in: Men. People with a family history of cannabis use disorder. People with mental health issues such as depression or post-traumatic stress disorder (PTSD). What are the signs or symptoms? Symptoms of this condition include: Addiction Using greater amounts of marijuana than you want to, or using marijuana for longer than you want to. Craving marijuana. Spending a lot of time getting marijuana, using it, or recovering from its effects. Having problems at work, at school, at home, or in relationships because of marijuana use. Giving up or cutting down on important life activities because of marijuana use. Using marijuana at times when it is dangerous, such as while you are driving a car. Needing more and more marijuana to get the same desired effect (building up a tolerance). Lack of motivation, known as amotivational syndrome, which leads to poor school and work performance. Physical problems A long-lasting cough. Bronchitis. Emphysema. Throat and lung  cancer. Mental problems Psychosis. Anxiety. Trouble sleeping. Increase in violent behavior in young people. Withdrawal problems You may have symptoms when you stop using marijuana. Symptoms include: Irritability or anger. Anxiety or restlessness. Trouble sleeping. Loss of appetite or weight loss. Aches and pains. Shakiness, sweating, or chills. How is this diagnosed? This condition is diagnosed with an assessment. During the assessment, your health care provider will ask about your marijuana use and how it affects your life. You will be diagnosed with the condition if you have had at least two symptoms of this condition within a 12-month period. How severe the condition is depends on how many symptoms you have. If you have two to three symptoms, your condition is mild. If you have four to five symptoms, your condition is moderate. If you have six or more symptoms, your condition is severe. Your health care provider may perform a physical exam or do lab tests to see if you have physical problems resulting from marijuana use. Your health care provider may also screen for drug use and refer you to a mental health professional for evaluation. How is this treated? Treatment for this condition is usually provided by mental health professionals with training in substance use disorders. Your treatment may involve: Counseling. This treatment is also called talk therapy. It is provided by substance use treatment counselors. A counselor can address the reasons you use marijuana and suggest ways to keep you from using it again. The goals of talk therapy are to: Find healthy activities to replace using marijuana. Identify and avoid the things that trigger your marijuana use. Help you learn how to handle cravings. Support groups. Support groups are led by people who have quit using marijuana.   They provide emotional support, advice, and guidance. Medicine. Medicine is used to treat mental health issues  that trigger marijuana use or that result from it. Follow these instructions at home: Lifestyle Make healthy lifestyle choices, such as: Eating a healthy diet. Getting enough exercise. Improving your stress-management skills.  General instructions Take over-the-counter, prescription medicines, and herbal remedies only as told by your health care provider. Check with your health care provider before starting any new medicines. Work with your counselor or group to develop tools to keep you from using marijuana again (relapsing). Learn daily living skills and work skills. Keep all follow-up visits as told by your health care provider. This is important. Where to find more information National Institute on Drug Abuse: www.drugabuse.gov Centers for Disease Control and Prevention: www.cdc.gov Substance Abuse and Mental Health Services Administration: www.samhsa.gov Contact a health care provider if: You are not able to take your medicines as told. Your symptoms get worse. Get help right away if: You have serious thoughts about hurting yourself or others. If you ever feel like you may hurt yourself or others, or have thoughts about taking your own life, get help right away. You can go to your nearest emergency department or call: Your local emergency services (911 in the [place] A suicide crisis helpline, such as the National Suicide Prevention Lifeline at 1-800-273-8255 or 988 in the [place] This is open 24 hours a day in the [place] Summary Cannabis use disorder is when using marijuana disrupts a person's daily life or causes health problems. This condition is caused by using marijuana too much over time. Treatment for this condition is usually provided by mental health professionals with training in substance use disorders. Treatment may involve counseling, support groups, or medicine. This information is not intended to replace advice given to you by your health care provider. Make sure you  discuss any questions you have with your health care provider. Document Revised: 04/27/2021 Document Reviewed: 05/04/2019 Elsevier Patient Education  2023 [employer]  

## 2021-10-12 NOTE — Progress Notes (Signed)
[facility] MD OP Progress Note ? ?10/12/2021 12:59 PM ?[place]  ?MRN:  [id number] ? ?Chief Complaint:  ?Chief Complaint  ?Patient presents with  ? Follow-up: 46 year old Caucasian male who has a history of MDD, GAD, social anxiety disorder, presented for medication management.  ? ?HPI: Raymond Rose is a 46 year old Caucasian male, married, lives in [place], has a history of MDD, GAD, social anxiety disorder, sleep apnea, cyclical vomiting, diabetes mellitus, erectile dysfunction was evaluated in office today. ? ?Patient's last appointment was on 08/01/2021.  At that time patient had discussed he did not believe his medications or any kind of therapy we were providing as beneficial and wanted to pursue alternate treatment.  At that time it was discussed with patient that patient hence will be released from my care and he was agreeable.  Patient however contacted the office on 10/08/2021 reporting that he was confused about the whole thing and that he wanted to continue to follow up with writer.  Patient also wanted ADA form filled out .  Patient hence was advised to schedule this appointment. ? ?Patient today appeared to be alert, oriented to person place time situation. ? ?Patient reported that he has been out of work since the past 3 weeks.  Patient notes he felt like he could not get out of his bed and did not have the motivation to get going and hence he could not go to work.  When writer questioned the patient about what kind of treatment he tried to receive since he was not feeling well for the past 3 weeks and why he did not return to see provider if he was still interested in medication management patient reported that he has been trying to get into starting Ketamine therapy with [facility].  Patient reports he has an appointment scheduled for ketamine therapy on Oct 16, 2021.  Patient reports he is going to get 2 treatments every 2 weeks or so initially. ? ?Patient reports he continues to struggle  with low motivation, feeling sad, excessive sleepiness, low energy and concentration problems.  This has been going on since the past few weeks. ? ?Patient reports he continues to have chronic suicidal thoughts however currently denies any active suicidal thoughts or plan.  ? ?Patient has been noncompliant with psychotherapy sessions however agrees to start therapy sessions again with this therapist.  I have coordinated care with Ms. Christina Hussami, therapist and discussed the same with patient.  Discussed him the need for more intensive therapy sessions given his current worsening symptoms.  Also discussed possible DBT treatment.  Patient agreed to all recommendations. ? ?Patient does report cannabis use, reports he has been using cannabis since the past 2 years.  This was never mentioned in sessions before.  Patient reports his sister who is a nurse advised he could use the cannabis for his vomiting and that is why he started using it.  He currently uses it every single day.  He could not give information about how much he uses however reports he uses it twice a day at least. ? ?Patient reports he continues to be compliant on his current medications.  Denies side effects. ? ? ?Visit Diagnosis:  ?  ICD-10-CM   ?1. MDD (major depressive disorder), recurrent episode, moderate (HCC)  F33.1 Lithium level  ?  BUN+Creat  ?  ?2. GAD (generalized anxiety disorder)  F41.1   ?  ?3. Social anxiety disorder  F40.10   ?  ?4. Insomnia due to mental disorder  F51.05   ? mood  ?  ?5. At risk for prolonged QT interval syndrome  Z91.89 EKG 12-Lead  ?  ?6. High risk medication use  Z79.899 Drugs of Abuse Scr ONLY, 10,WB  ?  Lithium level  ?  BUN+Creat  ?  EKG 12-Lead  ?  ?7. Long term current use of cannabis  F12.90   ?  ? ? ?Past Psychiatric History:  ?Reviewed past psychiatric history from progress note on 04/21/2019.  Past trials of medications like Lexapro, Wellbutrin, Elavil, Effexor, Zyprexa, Seroquel. ? ? ?Past Medical  History:  ?Past Medical History:  ?Diagnosis Date  ? Anxiety   ? Arthritis   ? COVID-19 06/2019  ? Cyclical vomiting   ? Depression   ? OSA on CPAP   ? Vertigo 01/03/2015  ?  ?Past Surgical History:  ?Procedure Laterality Date  ? BONE TUMOR EXCISION  1992  ? left arm  ? CHOLECYSTECTOMY  2010  ? lap Cholecystectomy  ? ct scan of abdomen  06/18/2012  ? with Contrast; 7cm right adrenal mass. 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 CT scan of Brain  12/23/2011  ? without contrast, [facility], Normal  ? TONSILLECTOMY  1985  ? ? ?Family Psychiatric History: Reviewed family psychiatric history from progress note on 04/21/2019. ? ?Family History:  ?Family History  ?Problem Relation Age of Onset  ? Depression Mother   ? Heart attack Father 20  ? Arthritis Other   ? Alcohol abuse Other   ? Lung cancer Other   ? Hyperlipidemia Other   ? CAD Other   ? Stroke Other   ? Hypertension Other   ? Bipolar disorder Other   ? ? ?Social History: Reviewed social history from progress note on 04/21/2019. ?Social History  ? ?Socioeconomic History  ? Marital status: Married  ?  Spouse name: Not on file  ? Number of children: Not on file  ? Years of education: Not on file  ? Highest education level: Not on file  ?Occupational History  ? Occupation: Banking  ?  Comment: [employer]  ?Tobacco Use  ? Smoking status: Former  ?  Years: 5.00  ?  Types: Cigarettes  ?  Quit date: 06/17/1997  ?  Years since quitting: 24.3  ? Smokeless tobacco: Never  ? Tobacco comments:  ?  started smoking at ahe 16, and quit at age 41  ?Vaping Use  ? Vaping Use: Never used  ?Substance and Sexual Activity  ? Alcohol use: Yes  ?  Alcohol/week: 0.0 standard drinks  ?  Comment: occasional alcohol use  ? Drug use: No  ? Sexual activity: Not on file  ?Other Topics Concern  ? Not on file  ?Social History Narrative  ? Not on file  ? ?Social Determinants of Health  ? ?Financial Resource Strain: Not on file  ?Food Insecurity: Not on file  ?Transportation Needs: Not on file   ?Physical Activity: Not on file  ?Stress: Not on file  ?Social Connections: Not on file  ? ? ?Allergies:  ?Allergies  ?Allergen Reactions  ? Bupropion   ?  Extreme anxiety  ? Morphine Other (See Comments)  ? ? ?Metabolic Disorder Labs: ?Lab Results  ?Component Value Date  ? HGBA1C 10.7 (A) 09/03/2021  ? MPG 243.17 01/23/2020  ? ?No results found for: PROLACTIN ?Lab Results  ?Component Value Date  ? CHOL 140 10/03/2020  ? TRIG 259 (H) 10/03/2020  ? HDL 37 (L) 10/03/2020  ? CHOLHDL  3.8 10/03/2020  ? [place] 62 10/03/2020  ? [place] 42 05/24/2019  ? ?Lab Results  ?Component Value Date  ? TSH 0.910 06/22/2021  ? TSH 1.590 12/04/2020  ? ? ?Therapeutic Level Labs: ?Lab Results  ?Component Value Date  ? LITHIUM 0.5 06/22/2021  ? LITHIUM 0.6 12/04/2020  ? ?No results found for: VALPROATE ?No components found for:  CBMZ ? ?Current Medications: ?Current Outpatient Medications  ?Medication Sig Dispense Refill  ? ALPRAZolam (XANAX) 0.5 MG tablet TAKE 1 TABLET BY MOUTH EVERY 4 HOURS AS NEEDE FOR ANXIETY 30 tablet 3  ? BD PEN NEEDLE NANO 2ND GEN 32G X 4 MM MISC USE AS DIRECTED 4 TIMES A DAY 100 each 3  ? blood glucose meter kit and supplies KIT Dispense based on patient and insurance preference. Use up to four times daily as directed. (FOR ICD-9 250.00, 250.01). 1 each 0  ? Dulaglutide 3 MG/0.5ML SOPN Inject 3 mg into the skin once a week. 2 mL 6  ? Eszopiclone 3 MG TABS TAKE 1 TABLET BY MOUTH AT BEDTIME TAKE IMMEDIATELY BEFORE BEDTIME 30 tablet 1  ? fluconazole (DIFLUCAN) 150 MG tablet TAKE 1 TABLET BY MOUTH AS ONE DOSE 1 tablet 3  ? hydrOXYzine (VISTARIL) 50 MG capsule Take 1-2 capsules (50-100 mg total) by mouth daily as needed. 60 capsule 0  ? insulin aspart (NOVOLOG FLEXPEN) 100 UNIT/ML FlexPen Inject up to 10 units three times daily as directed by physician (Patient taking differently: Inject up to 20 units three times daily as directed by physician) 15 mL 5  ? insulin detemir (LEVEMIR FLEXPEN) 100 UNIT/ML FlexPen Inject  54 Units into the skin daily. 15 mL 11  ? ketoconazole (NIZORAL) 2 % cream APPLY TO AFFECTED AREA ONCE DAILY FOR 7 DAYS AS NEEDED (Patient taking differently: Apply 1 application. topically daily. APPLY TO AFF

## 2021-10-12 NOTE — Telephone Encounter (Signed)
Patient called in to inform Dr Caryn Section that he has a deadline for his paperwork and need it ASAP please advise  ?

## 2021-10-12 NOTE — Telephone Encounter (Signed)
Copied from [place] [phone]. Topic: General - Other ?>> Oct 12, 2021 11:08 AM Jodie Echevaria wrote: ?Reason for CRM: Patient called in to inform Dr Caryn Section that his paper work has a deadline to be turned in and he is need them completed ASAP please ?

## 2021-10-12 NOTE — Telephone Encounter (Signed)
Additional information is needed from patient. Tried calling patient. Left message to call back. I also sent my chart message requesting information. See mychart message.  ?

## 2021-10-12 NOTE — Telephone Encounter (Signed)
Form asks for a starting date. Does patient know that start date for the treatments he going to be having? ?

## 2021-10-12 NOTE — Telephone Encounter (Signed)
Form printed and left for Dr. Caryn Section to review.  ?

## 2021-10-15 ENCOUNTER — Telehealth: Payer: Self-pay

## 2021-10-15 NOTE — Telephone Encounter (Signed)
Copied from [place]. Topic: General - Other ?>> Oct 15, 2021  2:38 PM Yvette Rack wrote: ?Reason for CRM: Pt called for a status update on his disability paperwork. Pt stated the deadline is tomorrow (10/16/21). Cb# [phone] ?

## 2021-10-15 NOTE — Telephone Encounter (Signed)
Was completed and sent to medical records to be faxed.  ?

## 2021-10-16 ENCOUNTER — Telehealth: Payer: Self-pay | Admitting: Family Medicine

## 2021-10-16 DIAGNOSIS — F332 Major depressive disorder, recurrent severe without psychotic features: Secondary | ICD-10-CM | POA: Diagnosis not present

## 2021-10-16 NOTE — Telephone Encounter (Signed)
Pts wife called in to follow up on this paperwork request, please advise.  ?

## 2021-10-16 NOTE — Telephone Encounter (Signed)
Has paperwork been faxed? Patients wife called requesting an update and confirmation that paperwork has been faxed. Deadline for paperwork to be submitted was today. Please call and update patient on status of fax.  ?

## 2021-10-16 NOTE — Telephone Encounter (Signed)
Left detailed message on voice message system (OK per DPR).  ?

## 2021-10-18 DIAGNOSIS — F332 Major depressive disorder, recurrent severe without psychotic features: Secondary | ICD-10-CM | POA: Diagnosis not present

## 2021-10-19 ENCOUNTER — Other Ambulatory Visit: Payer: Self-pay | Admitting: Family Medicine

## 2021-10-22 ENCOUNTER — Other Ambulatory Visit: Payer: Self-pay | Admitting: Family Medicine

## 2021-10-22 MED ORDER — NOVOLOG FLEXPEN 100 UNIT/ML ~~LOC~~ SOPN: PEN_INJECTOR | SUBCUTANEOUS | 5 refills | Status: DC

## 2021-10-22 MED ORDER — OXYCODONE-ACETAMINOPHEN 7.5-325 MG PO TABS: 1.0000 | ORAL_TABLET | ORAL | 0 refills | Status: DC | PRN

## 2021-10-23 DIAGNOSIS — F332 Major depressive disorder, recurrent severe without psychotic features: Secondary | ICD-10-CM | POA: Diagnosis not present

## 2021-10-24 ENCOUNTER — Telehealth (INDEPENDENT_AMBULATORY_CARE_PROVIDER_SITE_OTHER): Payer: 59 | Admitting: Family Medicine

## 2021-10-24 DIAGNOSIS — R1115 Cyclical vomiting syndrome unrelated to migraine: Secondary | ICD-10-CM | POA: Diagnosis not present

## 2021-10-24 DIAGNOSIS — F330 Major depressive disorder, recurrent, mild: Secondary | ICD-10-CM

## 2021-10-24 NOTE — Progress Notes (Signed)
? ? ?I,[name],acting as a scribe for [name], MD.,have documented all relevant documentation on the behalf of [name], MD,as directed by  [name], MD while in the presence of [name], MD. ? ?MyChart Video Visit ? ? ? ?Virtual Visit via Video Note  ? ?This visit type was conducted due to national recommendations for restrictions regarding the COVID-19 Pandemic (e.g. social distancing) in an effort to limit this patient's exposure and mitigate transmission in our community. This patient is at least at moderate risk for complications without adequate follow up. This format is felt to be most appropriate for this patient at this time. Physical exam was limited by quality of the video and audio technology used for the visit.  ? ?Patient location: home  ?Provider location: [facility]  ? ?I discussed the limitations of evaluation and management by telemedicine and the availability of in person appointments. The patient expressed understanding and agreed to proceed. ? ?Patient: Raymond Rose   DOB: 1975-09-21   46 y.o. Male  MRN: [id number] ?Visit Date: 10/24/2021 ? ?Today's healthcare provider: [name], MD  ? ?No chief complaint on file. ? ?Subjective  ?  ? ?Patient recently started Ketamine treatments for major depression and had work accomodation to miss 1-2 days a week until after treatments are completed July 25. However he also has several history of cyclic vomiting syndrome which has been much more persistently lately. He discussed with his employer who recommended going out on STD for N/V and ketamine treatments through July 25th. He was previously followed by [facility] in [place] for cyclic vomiting but has not been seen there for several years and the doctor he was seeing is no longer there. He would like to establish with another gastroenterologist.   ? ? ? ?Medications: ?Outpatient Medications Prior to Visit  ?Medication Sig  ? ALPRAZolam (XANAX) 0.5 MG tablet TAKE 1 TABLET  BY MOUTH EVERY 4 HOURS AS NEEDE FOR ANXIETY  ? BD PEN NEEDLE NANO 2ND GEN 32G X 4 MM MISC USE AS DIRECTED 4 TIMES A DAY  ? blood glucose meter kit and supplies KIT Dispense based on patient and insurance preference. Use up to four times daily as directed. (FOR ICD-9 250.00, 250.01).  ? Dulaglutide 3 MG/0.5ML SOPN Inject 3 mg into the skin once a week.  ? Eszopiclone 3 MG TABS TAKE 1 TABLET BY MOUTH AT BEDTIME TAKE IMMEDIATELY BEFORE BEDTIME  ? fluconazole (DIFLUCAN) 150 MG tablet TAKE 1 TABLET BY MOUTH AS ONE DOSE  ? hydrOXYzine (VISTARIL) 50 MG capsule Take 1-2 capsules (50-100 mg total) by mouth daily as needed.  ? insulin aspart (NOVOLOG FLEXPEN) 100 UNIT/ML FlexPen Inject up to 20 units three times daily as directed by physician  ? insulin detemir (LEVEMIR FLEXPEN) 100 UNIT/ML FlexPen Inject 54 Units into the skin daily.  ? ketoconazole (NIZORAL) 2 % cream APPLY TO AFFECTED AREA ONCE DAILY FOR 7 DAYS AS NEEDED (Patient taking differently: Apply 1 application. topically daily. APPLY TO AFFECTED AREA ONCE DAILY FOR 7 DAYS AS NEEDED)  ? lamoTRIgine (LAMICTAL) 100 MG tablet Take 1.5 tablets (150 mg total) by mouth daily.  ? lithium carbonate 300 MG capsule Take 1-2 capsules (300-600 mg total) by mouth as directed. START TAKING 1 CAP DAILY AM AND 2 CAPS DAILY PM  ? metFORMIN (GLUCOPHAGE-XR) 500 MG 24 hr tablet TAKE 2 TABLETS BY MOUTH TWICE A DAY  ? omeprazole (PRILOSEC) 40 MG capsule TAKE 1 CAPSULE BY MOUTH EVERY DAY  ?  oxyCODONE-acetaminophen (PERCOCET) 7.5-325 MG tablet Take 1 tablet by mouth every 4 (four) hours as needed.  ? prazosin (MINIPRESS) 2 MG capsule Take 1 capsule (2 mg total) by mouth at bedtime.  ? prochlorperazine (COMPAZINE) 10 MG tablet TAKE 1/2 - 1 TABLETS BY MOUTH EVERY 6 (SIX) HOURS AS NEEDED FOR NAUSEA OR VOMITING.  ? rosuvastatin (CRESTOR) 20 MG tablet TAKE 1 TABLET BY MOUTH EVERY DAY  ? sildenafil (VIAGRA) 50 MG tablet TAKE 1 TO 2 TABLETS BY MOUTH DAILY AS NEEDED  ? tamsulosin (FLOMAX) 0.4 MG  CAPS capsule Take 0.4 mg by mouth daily.  ? Vilazodone HCl (VIIBRYD) 40 MG TABS Take 1 tablet (40 mg total) by mouth daily.  ? ?No facility-administered medications prior to visit.  ? ? ? ? ? ? Objective  ?  ?There were no vitals taken for this visit. ? ? ? ? ?Physical Exam  ? ?General appearance: Obese male, cooperative and in no acute distress ?Head: Normocephalic, without obvious abnormality, atraumatic ?Respiratory: Respirations even and unlabored, normal respiratory rate ?Extremities: All extremities are intact.  ?Skin: Skin color, texture, turgor normal. No rashes seen  ?Psych: Appropriate mood and affect. ?Neurologic: Mental status: Alert, oriented to person, place, and time, thought content appropriate.  ? ? Assessment & Plan  ?  ? ?1. MDD (major depressive disorder), recurrent episode, mild ([place]) ?Recently started 12 week treatment course of ketamine  ? ?2. Cyclic vomiting syndrome ?Has not GI follow up in a few years and symptoms worsening lately. Refer back to [facility] ? ?Anticipate medical leave through at least 7-25 to complete ketamine treatments for major depression and evaluation and management of worsening chronic nausea and vomiting. Patient will send forms vit Mychart ?  ? ?I discussed the assessment and treatment plan with the patient. The patient was provided an opportunity to ask questions and all were answered. The patient agreed with the plan and demonstrated an understanding of the instructions. ?  ?The patient was advised to call back or seek an in-person evaluation if the symptoms worsen or if the condition fails to improve as anticipated. ? ?I provided 8 minutes of non-face-to-face time during this encounter. ? ?The entirety of the information documented in the History of Present Illness, Review of Systems and Physical Exam were personally obtained by me. Portions of this information were initially documented by the CMA and reviewed by me for thoroughness and accuracy.   ? ?[name],  MD ?[facility] ?[phone] (phone) ?[phone] (fax) ? ?[facility]  ?

## 2021-10-25 ENCOUNTER — Encounter: Payer: Self-pay | Admitting: Family Medicine

## 2021-10-25 DIAGNOSIS — F332 Major depressive disorder, recurrent severe without psychotic features: Secondary | ICD-10-CM | POA: Diagnosis not present

## 2021-10-30 ENCOUNTER — Other Ambulatory Visit: Payer: Self-pay | Admitting: Family Medicine

## 2021-10-30 DIAGNOSIS — F332 Major depressive disorder, recurrent severe without psychotic features: Secondary | ICD-10-CM | POA: Diagnosis not present

## 2021-10-31 ENCOUNTER — Telehealth: Payer: Self-pay

## 2021-10-31 MED ORDER — OXYCODONE-ACETAMINOPHEN 7.5-325 MG PO TABS: 1.0000 | ORAL_TABLET | ORAL | 0 refills | Status: DC | PRN

## 2021-10-31 NOTE — Telephone Encounter (Signed)
Pt informed paperwork is not ready yet and to give [place] up to 10 business days if needed for this.  Pt agreeable and expressed appreciation. Advised we will make him aware when completed.   ?

## 2021-10-31 NOTE — Telephone Encounter (Signed)
Copied from [place] [id number]. Topic: General - Other ?>> Oct 31, 2021 12:19 PM Bayard Beaver wrote: ?Reason for CRM:pt called in regarding disability paperwork he requested may 11. Please ref note in mychart om May 11. Please call back ?

## 2021-11-01 DIAGNOSIS — F332 Major depressive disorder, recurrent severe without psychotic features: Secondary | ICD-10-CM | POA: Diagnosis not present

## 2021-11-05 ENCOUNTER — Telehealth: Payer: Self-pay

## 2021-11-05 NOTE — Telephone Encounter (Signed)
Patient called to get an update on his disability forms that were attached to a my chart message 10/25/21. Pt also called to get update on referral to GI. Pt was given [facility] phone number to schedule OV. Please advise pt.

## 2021-11-06 DIAGNOSIS — F332 Major depressive disorder, recurrent severe without psychotic features: Secondary | ICD-10-CM | POA: Diagnosis not present

## 2021-11-06 NOTE — Telephone Encounter (Signed)
Please give the patient a call .  Please advise

## 2021-11-07 NOTE — Telephone Encounter (Signed)
Please advise whether disability paperwork has been completed.

## 2021-11-08 DIAGNOSIS — F332 Major depressive disorder, recurrent severe without psychotic features: Secondary | ICD-10-CM | POA: Diagnosis not present

## 2021-11-08 NOTE — Telephone Encounter (Signed)
Pt's spouse called in to follow up on disability paperwork?

## 2021-11-08 NOTE — Telephone Encounter (Signed)
Disability forms will be ready for him a his visit tomorrow. GI referral is in progress. They requested additional medication records and am in the process of sending requested information.

## 2021-11-08 NOTE — Progress Notes (Signed)
I,[name],acting as a scribe for [name], MD.,have documented all relevant documentation on the behalf of [name], MD,as directed by  [name], MD while in the presence of [name], MD.   Established patient visit   Patient: Raymond Rose   DOB: September 30, 1975   46 y.o. Male  MRN: [id number] Visit Date: 11/09/2021  Today's healthcare provider: [name], MD   Chief Complaint  Patient presents with   Diabetes   Hand Pain   Subjective    HPI  Diabetes Mellitus Type II, Follow-up  Lab Results  Component Value Date   HGBA1C 11.2 (A) 11/09/2021   HGBA1C 10.7 (A) 09/03/2021   HGBA1C 13.5 (A) 06/19/2021   Wt Readings from Last 3 Encounters:  11/09/21 (!) 308 lb (139.7 kg)  10/09/21 (!) 317 lb 9.6 oz (144.1 kg)  09/03/21 (!) 321 lb 3.2 oz (145.7 kg)   Last seen for diabetes 2 months ago.  Management since then includes increasing- insulin detemir (LEVEMIR FLEXPEN) 100 UNIT/ML FlexPen; to 54 Units into the skin daily.   He reports good compliance with treatment. He is not having side effects.  Symptoms: No fatigue No foot ulcerations  Yes appetite changes Yes nausea  No paresthesia of the feet  No polydipsia  No polyuria No visual disturbances   Yes vomiting     Home blood sugar records: fasting range: 250-300  Episodes of hypoglycemia? No    Current insulin regiment: Novolog sliding scale and Levemir 54 units daily Most Recent Eye Exam: 12/04/2020 Current exercise: none Current diet habits: in general, an "unhealthy" diet  Pertinent Labs: Lab Results  Component Value Date   CHOL 140 10/03/2020   HDL 37 (L) 10/03/2020   LDLCALC 62 10/03/2020   TRIG 259 (H) 10/03/2020   CHOLHDL 3.8 10/03/2020   Lab Results  Component Value Date   NA 135 12/04/2020   K 4.7 12/04/2020   CREATININE 0.70 (L) 06/22/2021   EGFR 117 06/22/2021   MICROALBUR 20 03/05/2021   LABMICR 6.7 09/03/2021      ---------------------------------------------------------------------------------------------------   Hand pain: Patient complains of pain in the right hand. Pain started 3 days ago when he felt a pop around the third MCP while carrying groceries. He had moderate amount of pain and swelling and applied ice to the back of his hand. Swelling has greatly improved but still painful.   Medications: Outpatient Medications Prior to Visit  Medication Sig   ALPRAZolam (XANAX) 0.5 MG tablet TAKE 1 TABLET BY MOUTH EVERY 4 HOURS AS NEEDE FOR ANXIETY   BD PEN NEEDLE NANO 2ND GEN 32G X 4 MM MISC USE AS DIRECTED 4 TIMES A DAY   blood glucose meter kit and supplies KIT Dispense based on patient and insurance preference. Use up to four times daily as directed. (FOR ICD-9 250.00, 250.01).   Dulaglutide 3 MG/0.5ML SOPN Inject 3 mg into the skin once a week.   Eszopiclone 3 MG TABS TAKE 1 TABLET BY MOUTH AT BEDTIME TAKE IMMEDIATELY BEFORE BEDTIME   fluconazole (DIFLUCAN) 150 MG tablet TAKE 1 TABLET BY MOUTH AS ONE DOSE   hydrOXYzine (VISTARIL) 50 MG capsule Take 1-2 capsules (50-100 mg total) by mouth daily as needed.   insulin aspart (NOVOLOG FLEXPEN) 100 UNIT/ML FlexPen Inject up to 20 units three times daily as directed by physician   insulin detemir (LEVEMIR FLEXPEN) 100 UNIT/ML FlexPen Inject 54 Units into the skin daily.   ketoconazole (NIZORAL) 2 % cream APPLY  TO AFFECTED AREA ONCE DAILY FOR 7 DAYS AS NEEDED (Patient taking differently: Apply 1 application. topically daily. APPLY TO AFFECTED AREA ONCE DAILY FOR 7 DAYS AS NEEDED)   lamoTRIgine (LAMICTAL) 100 MG tablet Take 1.5 tablets (150 mg total) by mouth daily.   lithium carbonate 300 MG capsule Take 1-2 capsules (300-600 mg total) by mouth as directed. START TAKING 1 CAP DAILY AM AND 2 CAPS DAILY PM   metFORMIN (GLUCOPHAGE-XR) 500 MG 24 hr tablet TAKE 2 TABLETS BY MOUTH TWICE A DAY   omeprazole (PRILOSEC) 40 MG capsule TAKE 1 CAPSULE BY MOUTH EVERY  DAY   oxyCODONE-acetaminophen (PERCOCET) 7.5-325 MG tablet Take 1 tablet by mouth every 4 (four) hours as needed.   prazosin (MINIPRESS) 2 MG capsule Take 1 capsule (2 mg total) by mouth at bedtime.   prochlorperazine (COMPAZINE) 10 MG tablet TAKE 1/2 - 1 TABLETS BY MOUTH EVERY 6 (SIX) HOURS AS NEEDED FOR NAUSEA OR VOMITING.   rosuvastatin (CRESTOR) 20 MG tablet TAKE 1 TABLET BY MOUTH EVERY DAY   sildenafil (VIAGRA) 50 MG tablet TAKE 1 TO 2 TABLETS BY MOUTH DAILY AS NEEDED   tamsulosin (FLOMAX) 0.4 MG CAPS capsule Take 0.4 mg by mouth daily.   Vilazodone HCl (VIIBRYD) 40 MG TABS Take 1 tablet (40 mg total) by mouth daily.   No facility-administered medications prior to visit.    Review of Systems  Constitutional:  Negative for appetite change, chills and fever.  Respiratory:  Negative for chest tightness, shortness of breath and wheezing.   Cardiovascular:  Negative for chest pain and palpitations.  Gastrointestinal:  Positive for nausea and vomiting. Negative for abdominal pain.  Musculoskeletal:  Positive for arthralgias (right hand pain).      Objective    BP 120/72 (BP Location: Right Arm)   Pulse 90   Temp 97.9 F (36.6 C) (Oral)   Resp 16   Wt (!) 308 lb (139.7 kg)   SpO2 99% Comment: room air  BMI 40.64 kg/m    Physical Exam   General appearance: Severely obese male, cooperative and in no acute distress Head: Normocephalic, without obvious abnormality, atraumatic Respiratory: Respirations even and unlabored, normal respiratory rate Extremities: All extremities are intact.  Skin: Skin color, texture, turgor normal. No rashes seen  Psych: Appropriate mood and affect. Neurologic: Mental status: Alert, oriented to person, place, and time, thought content appropriate.  FROM of MCPs of right hand. Slightly tender and swollen dorsum of third MCP.     Results for orders placed or performed in visit on 11/09/21  POCT HgB A1C  Result Value Ref Range   Hemoglobin A1C 11.2  (A) 4.0 - 5.6 %   Est. average glucose Bld gHb Est-mCnc 275     Assessment & Plan     1. Uncontrolled type 2 diabetes mellitus with hyperglycemia (HCC) Will add pioglitazone (ACTOS) 30 MG tablet; Take 1 tablet (30 mg total) by mouth daily.  Dispense: 30 tablet; Refill: 2  2. Sprain, MCP, hand, right, initial encounter Seems to healing appropriately. Advised to let me know if not continuing to improve or not much better next week.   3. Recurrent major depressive disorder, in partial remission ([place]) Now on third week of ketamine treatments which he feels is going well. Is currently out on disability for ketamine treatments in cyclic vomiting as below.   4. Cyclic vomiting syndrome Referral back to [facility] is place. Have tried stopping GLP-1 agonis on several occasions in the  past which  made no difference. Suspect a significant psychiatric component to this. Have completed disability forms.      The entirety of the information documented in the History of Present Illness, Review of Systems and Physical Exam were personally obtained by me. Portions of this information were initially documented by the CMA and reviewed by me for thoroughness and accuracy.     [name], MD  [facility] [phone] (phone) [phone] (fax)  [place]

## 2021-11-09 ENCOUNTER — Encounter: Payer: Self-pay | Admitting: Family Medicine

## 2021-11-09 ENCOUNTER — Ambulatory Visit (INDEPENDENT_AMBULATORY_CARE_PROVIDER_SITE_OTHER): Payer: 59 | Admitting: Family Medicine

## 2021-11-09 VITALS — BP 120/72 | HR 90 | Temp 97.9°F | Resp 16 | Wt 308.0 lb

## 2021-11-09 DIAGNOSIS — S63659A Sprain of metacarpophalangeal joint of unspecified finger, initial encounter: Secondary | ICD-10-CM

## 2021-11-09 DIAGNOSIS — E1165 Type 2 diabetes mellitus with hyperglycemia: Secondary | ICD-10-CM | POA: Diagnosis not present

## 2021-11-09 DIAGNOSIS — R1115 Cyclical vomiting syndrome unrelated to migraine: Secondary | ICD-10-CM | POA: Diagnosis not present

## 2021-11-09 DIAGNOSIS — F3341 Major depressive disorder, recurrent, in partial remission: Secondary | ICD-10-CM

## 2021-11-09 LAB — POCT GLYCOSYLATED HEMOGLOBIN (HGB A1C)
Est. average glucose Bld gHb Est-mCnc: 275
Hemoglobin A1C: 11.2 % — AB (ref 4.0–5.6)

## 2021-11-09 MED ORDER — PIOGLITAZONE HCL 30 MG PO TABS: 30.0000 mg | ORAL_TABLET | Freq: Every day | ORAL | 2 refills | Status: DC

## 2021-11-09 NOTE — Telephone Encounter (Signed)
Pt seen in office this morning and aware.

## 2021-11-13 ENCOUNTER — Telehealth: Payer: Self-pay | Admitting: Family Medicine

## 2021-11-13 ENCOUNTER — Other Ambulatory Visit: Payer: Self-pay | Admitting: Family Medicine

## 2021-11-13 DIAGNOSIS — F332 Major depressive disorder, recurrent severe without psychotic features: Secondary | ICD-10-CM | POA: Diagnosis not present

## 2021-11-13 NOTE — Telephone Encounter (Signed)
Pt is calling regarding referral to [facility]. GI sent a fax requesting further information. Please advise CB- [phone]

## 2021-11-14 NOTE — Telephone Encounter (Signed)
I don't have any additional information regarding the ketamine treatments. They need to contact the patient's psychiatrist if they need those records. I don't have any contact information for his psychiatrist. You probably need to get that from patient.

## 2021-11-14 NOTE — Telephone Encounter (Signed)
Attempted to reach Reba to inform and office was already closed for the day. Unable to leave message.  Also called pt and LM informing him of Dr. Maralyn Sago response.

## 2021-11-14 NOTE — Telephone Encounter (Signed)
Did you receive a fax from [place]?

## 2021-11-14 NOTE — Telephone Encounter (Signed)
Reba called requesting additional notes, because the referring document states that the patient receives treatment for ketamine. She says they need notes for this.   Best contact: [phone], requesting a call back from the office. Please advise

## 2021-11-15 DIAGNOSIS — F332 Major depressive disorder, recurrent severe without psychotic features: Secondary | ICD-10-CM | POA: Diagnosis not present

## 2021-11-16 MED ORDER — OXYCODONE-ACETAMINOPHEN 7.5-325 MG PO TABS: 1.0000 | ORAL_TABLET | ORAL | 0 refills | Status: DC | PRN

## 2021-11-18 ENCOUNTER — Other Ambulatory Visit: Payer: Self-pay | Admitting: Psychiatry

## 2021-11-18 ENCOUNTER — Other Ambulatory Visit: Payer: Self-pay | Admitting: Family Medicine

## 2021-11-18 DIAGNOSIS — Z79899 Other long term (current) drug therapy: Secondary | ICD-10-CM

## 2021-11-19 DIAGNOSIS — F332 Major depressive disorder, recurrent severe without psychotic features: Secondary | ICD-10-CM | POA: Diagnosis not present

## 2021-11-21 DIAGNOSIS — F332 Major depressive disorder, recurrent severe without psychotic features: Secondary | ICD-10-CM | POA: Diagnosis not present

## 2021-11-22 ENCOUNTER — Telehealth: Payer: Self-pay | Admitting: Psychiatry

## 2021-11-22 NOTE — Telephone Encounter (Signed)
Patient called to cancel both therapy and psychiatry appointments scheduled for June. Stated he had found another provider.

## 2021-11-23 ENCOUNTER — Other Ambulatory Visit: Payer: Self-pay | Admitting: Family Medicine

## 2021-11-25 MED ORDER — OXYCODONE-ACETAMINOPHEN 7.5-325 MG PO TABS: 1.0000 | ORAL_TABLET | ORAL | 0 refills | Status: DC | PRN

## 2021-11-26 ENCOUNTER — Ambulatory Visit: Payer: Self-pay

## 2021-11-26 DIAGNOSIS — F332 Major depressive disorder, recurrent severe without psychotic features: Secondary | ICD-10-CM | POA: Diagnosis not present

## 2021-11-26 DIAGNOSIS — F411 Generalized anxiety disorder: Secondary | ICD-10-CM

## 2021-11-26 MED ORDER — VILAZODONE HCL 40 MG PO TABS: 40.0000 mg | ORAL_TABLET | Freq: Every day | ORAL | 0 refills | Status: DC

## 2021-11-26 NOTE — Telephone Encounter (Signed)
Patient called, left VM to return the call to the office to discuss refill request with a nurse.     Summary: requestiing med viibryd   Pt caled in requesting refill of Vilazodone HCl (VIIBRYD) 40 MG TABS. He states left on bad terms with previous prescribing Dr so unable to request it there and he trying to avoid a having a depressed episode and is getting other treatment for depression also. Pharmacy is [facility], [place] [address] AT [address]  Phone: [phone]  Fax: [phone]

## 2021-11-26 NOTE — Telephone Encounter (Signed)
Requesting med Viibryd. Please see annotation.

## 2021-11-26 NOTE — Telephone Encounter (Signed)
  Chief Complaint: Out of needed medication Symptoms:  Frequency: out of medication for 3 days Pertinent Negatives: Patient denies  Disposition: '[]'$ ED /'[]'$ Urgent Care (no appt availability in office) / '[]'$ Appointment(In office/virtual)/ '[]'$  [facility]/ '[]'$ Home Care/ '[]'$ Refused Recommended Disposition /'[]'$ [facility]/ '[x]'$  Follow-up with PCP Additional Notes: Pt is out of his medication for the past 3 days. Pt is starting with a new psychiatrist in 2 weeks  Bonne Dolores at [facility] psychiatry. Pt needs enough medication called in to get to upcoming appt.   [facility] into [facility]  - see below.    Summary: requestiing med viibryd   Pt caled in requesting refill of Vilazodone HCl (VIIBRYD) 40 MG TABS. He states left on bad terms with previous prescribing Dr so unable to request it there and he trying to avoid a having a depressed episode and is getting other treatment for depression also. Pharmacy is [facility], [place] [address]  Phone: [phone]  Fax: [phone]      Reason for Disposition  [1] Prescription refill request for ESSENTIAL medicine (i.e., likelihood of harm to patient if not taken) AND [2] triager unable to refill per department policy  Answer Assessment - Initial Assessment Questions 1. DRUG NAME: "What medicine do you need to have refilled?"     Vilazodone '40mg'$  2. REFILLS REMAINING: "How many refills are remaining?" (Note: The label on the medicine or pill bottle will show how many refills are remaining. If there are no refills remaining, then a renewal may be needed.)     0 - out of medication for 3 days 3. EXPIRATION DATE: "What is the expiration date?" (Note: The label states when the prescription will expire, and thus can no longer be refilled.)      4. PRESCRIBING HCP: "Who prescribed it?" Reason: If prescribed by specialist, call should be referred to that group.     Was prescribed by  psychiatrist  5. SYMPTOMS: "Do you have any symptoms?"      6. PREGNANCY: "Is there any chance that you are pregnant?" "When was your last menstrual period?"     na  Protocols used: Medication Refill and Renewal Call-A-AH

## 2021-11-26 NOTE — Addendum Note (Signed)
Addended by: Birdie Sons on: 11/26/2021 01:58 PM   Modules accepted: Orders

## 2021-11-28 ENCOUNTER — Encounter: Payer: Self-pay | Admitting: Psychiatry

## 2021-11-28 ENCOUNTER — Telehealth: Payer: Self-pay | Admitting: Psychiatry

## 2021-11-28 DIAGNOSIS — F331 Major depressive disorder, recurrent, moderate: Secondary | ICD-10-CM

## 2021-11-28 DIAGNOSIS — Z91199 Patient's noncompliance with other medical treatment and regimen due to unspecified reason: Secondary | ICD-10-CM

## 2021-11-28 DIAGNOSIS — F5105 Insomnia due to other mental disorder: Secondary | ICD-10-CM

## 2021-11-28 MED ORDER — HYDROXYZINE PAMOATE 50 MG PO CAPS: 50.0000 mg | ORAL_CAPSULE | Freq: Every day | ORAL | 0 refills | Status: DC | PRN

## 2021-11-28 MED ORDER — PRAZOSIN HCL 2 MG PO CAPS: 2.0000 mg | ORAL_CAPSULE | Freq: Every day | ORAL | 0 refills | Status: DC

## 2021-11-28 NOTE — Telephone Encounter (Signed)
Patient has 1 refill pending on Lunesta. Patient has enough supplies of lithium to last until mid July. A prescription for Lamictal was provided beginning of June for 30-day supply. I will send prazosin and hydroxyzine for 30-day supply. It looks like Viibryd was already renewed by his primary care provider.  Will print out a letter releasing patient from my care since he is not interested in continuing treatment here at our practice.

## 2021-11-28 NOTE — Telephone Encounter (Signed)
Letter will be sent out certified mail

## 2021-11-29 DIAGNOSIS — F332 Major depressive disorder, recurrent severe without psychotic features: Secondary | ICD-10-CM | POA: Diagnosis not present

## 2021-11-30 ENCOUNTER — Ambulatory Visit: Payer: 59 | Admitting: Licensed Clinical Social Worker

## 2021-12-03 ENCOUNTER — Other Ambulatory Visit: Payer: Self-pay | Admitting: Family Medicine

## 2021-12-03 ENCOUNTER — Telehealth: Payer: 59 | Admitting: Psychiatry

## 2021-12-03 DIAGNOSIS — F411 Generalized anxiety disorder: Secondary | ICD-10-CM | POA: Diagnosis not present

## 2021-12-03 DIAGNOSIS — F515 Nightmare disorder: Secondary | ICD-10-CM | POA: Diagnosis not present

## 2021-12-03 DIAGNOSIS — G4700 Insomnia, unspecified: Secondary | ICD-10-CM | POA: Diagnosis not present

## 2021-12-03 DIAGNOSIS — F341 Dysthymic disorder: Secondary | ICD-10-CM | POA: Diagnosis not present

## 2021-12-04 DIAGNOSIS — F332 Major depressive disorder, recurrent severe without psychotic features: Secondary | ICD-10-CM | POA: Diagnosis not present

## 2021-12-05 ENCOUNTER — Other Ambulatory Visit: Payer: Self-pay | Admitting: Family Medicine

## 2021-12-05 ENCOUNTER — Telehealth: Payer: Self-pay

## 2021-12-05 NOTE — Telephone Encounter (Signed)
Copied from [place] [id number]. Topic: General - Other >> Dec 05, 2021  2:09 PM Everette C wrote: Reason for CRM: The patient has called for an update on their previously requested oxyCODONE-acetaminophen (PERCOCET) 7.5-325 MG tablet [[id number]]   Please contact the patient further when possible

## 2021-12-06 DIAGNOSIS — F332 Major depressive disorder, recurrent severe without psychotic features: Secondary | ICD-10-CM | POA: Diagnosis not present

## 2021-12-06 MED ORDER — OXYCODONE-ACETAMINOPHEN 7.5-325 MG PO TABS: 1.0000 | ORAL_TABLET | ORAL | 0 refills | Status: DC | PRN

## 2021-12-14 ENCOUNTER — Other Ambulatory Visit: Payer: Self-pay | Admitting: Family Medicine

## 2021-12-16 MED ORDER — OXYCODONE-ACETAMINOPHEN 7.5-325 MG PO TABS: 1.0000 | ORAL_TABLET | ORAL | 0 refills | Status: DC | PRN

## 2021-12-20 DIAGNOSIS — F332 Major depressive disorder, recurrent severe without psychotic features: Secondary | ICD-10-CM | POA: Diagnosis not present

## 2021-12-24 ENCOUNTER — Encounter: Payer: Self-pay | Admitting: Family Medicine

## 2021-12-24 ENCOUNTER — Telehealth (INDEPENDENT_AMBULATORY_CARE_PROVIDER_SITE_OTHER): Payer: 59 | Admitting: Family Medicine

## 2021-12-24 DIAGNOSIS — F330 Major depressive disorder, recurrent, mild: Secondary | ICD-10-CM | POA: Diagnosis not present

## 2021-12-24 DIAGNOSIS — R1115 Cyclical vomiting syndrome unrelated to migraine: Secondary | ICD-10-CM | POA: Diagnosis not present

## 2021-12-24 NOTE — Progress Notes (Signed)
MyChart Video Visit    Virtual Visit via Video Note   This visit type was conducted due to national recommendations for restrictions regarding the COVID-19 Pandemic (e.g. social distancing) in an effort to limit this patient's exposure and mitigate transmission in our community. This patient is at least at moderate risk for complications without adequate follow up. This format is felt to be most appropriate for this patient at this time. Physical exam was limited by quality of the video and audio technology used for the visit.   Patient location: home Provider location: [facility]  I discussed the limitations of evaluation and management by telemedicine and the availability of in person appointments. The patient expressed understanding and agreed to proceed.  Patient: Raymond Rose   DOB: 10-30-1975   46 y.o. Male  MRN: [id number] Visit Date: 12/24/2021  Today's healthcare provider: [name], MD   Chief Complaint  Patient presents with   Follow-up   Cyclic vomiting syndrome   Subjective    HPI  Patient is being seen today in regards to STD paperwork. Forms were sent today via mychart  Follow up for Cyclic vomiting syndrome:  The patient was last seen for this on 11/09/2021.   Changes made at last visit include none. Disability forms were completed during that visit. He was referred to [facility], but the soonest appointment they had was 02/14/2022. Patient reports that his short term disability ends on 01/08/2022.    He continues to have nearly continuous nausea with several episodes of vomiting most days. Has been worse since depression has worsened over the last several months.   He is also followed by psychiatry for major depression and started twice a week ketamine treatments in May. He expects this will change to monthly maintenance treatments in the future. Is tolerating treatments fairly well.     -----------------------------------------------------------------------------------------    Medications: Outpatient Medications Prior to Visit  Medication Sig   ALPRAZolam (XANAX) 0.5 MG tablet TAKE 1 TABLET BY MOUTH EVERY 4 HOURS AS NEEDE FOR ANXIETY   BD PEN NEEDLE NANO 2ND GEN 32G X 4 MM MISC USE AS DIRECTED 4 TIMES A DAY   blood glucose meter kit and supplies KIT Dispense based on patient and insurance preference. Use up to four times daily as directed. (FOR ICD-9 250.00, 250.01).   Dulaglutide 3 MG/0.5ML SOPN Inject 3 mg into the skin once a week.   Eszopiclone 3 MG TABS TAKE 1 TABLET BY MOUTH AT BEDTIME TAKE IMMEDIATELY BEFORE BEDTIME   hydrOXYzine (VISTARIL) 50 MG capsule Take 1-2 capsules (50-100 mg total) by mouth daily as needed.   insulin aspart (NOVOLOG FLEXPEN) 100 UNIT/ML FlexPen Inject up to 20 units three times daily as directed by physician   insulin detemir (LEVEMIR FLEXPEN) 100 UNIT/ML FlexPen Inject 54 Units into the skin daily.   ketoconazole (NIZORAL) 2 % cream APPLY TO AFFECTED AREA ONCE DAILY FOR 7 DAYS AS NEEDED (Patient taking differently: Apply 1 application  topically daily. APPLY TO AFFECTED AREA ONCE DAILY FOR 7 DAYS AS NEEDED)   lamoTRIgine (LAMICTAL) 100 MG tablet TAKE 1.5 TABLETS (150MG TOTAL) BY MOUTH DAILY   lithium carbonate 300 MG capsule Take 1-2 capsules (300-600 mg total) by mouth as directed. START TAKING 1 CAP DAILY AM AND 2 CAPS DAILY PM   metFORMIN (GLUCOPHAGE-XR) 500 MG 24 hr tablet TAKE 2 TABLETS BY MOUTH TWICE A DAY   omeprazole (PRILOSEC) 40 MG capsule TAKE 1 CAPSULE BY MOUTH EVERY DAY  oxyCODONE-acetaminophen (PERCOCET) 7.5-325 MG tablet Take 1 tablet by mouth every 4 (four) hours as needed.   pioglitazone (ACTOS) 30 MG tablet Take 1 tablet (30 mg total) by mouth daily.   prazosin (MINIPRESS) 2 MG capsule Take 1 capsule (2 mg total) by mouth at bedtime.   prochlorperazine (COMPAZINE) 10 MG tablet TAKE 1/2 - 1 TABLETS BY MOUTH EVERY 6  (SIX) HOURS AS NEEDED FOR NAUSEA OR VOMITING.   rosuvastatin (CRESTOR) 20 MG tablet TAKE 1 TABLET BY MOUTH EVERY DAY   sildenafil (VIAGRA) 50 MG tablet TAKE 1 TO 2 TABLETS BY MOUTH DAILY AS NEEDED   tamsulosin (FLOMAX) 0.4 MG CAPS capsule Take 0.4 mg by mouth daily.   Vilazodone HCl (VIIBRYD) 40 MG TABS Take 1 tablet (40 mg total) by mouth daily.   fluconazole (DIFLUCAN) 150 MG tablet TAKE 1 TABLET BY MOUTH AS ONE DOSE (Patient not taking: Reported on 12/24/2021)   No facility-administered medications prior to visit.    Review of Systems  Constitutional:  Negative for appetite change, chills and fever.  Respiratory:  Negative for chest tightness, shortness of breath and wheezing.   Cardiovascular:  Negative for chest pain and palpitations.  Gastrointestinal:  Positive for nausea and vomiting. Negative for abdominal pain.       Objective    There were no vitals taken for this visit.     Physical Exam   Awake, alert, oriented x 3. In no apparent distress    Assessment & Plan      1. Cyclic vomiting syndrome Longstanding, but worsening over the last 4-5 months. May be exacerbated by underlying anxiety and depression. Has not had follow up with GI for several years and needs to re-establish for further evaluation. Requires evaluation at tertiary care center but first available appointment is August 31st at [facility] He is unable to perform any meaningful work at this time due to recurrent nausea and vomiting.   2. MDD (major depressive disorder), recurrent episode, mild (HCC) Continues twice weekly ketamine treatments per psychiatry which will continue for the foreseeable future.      I discussed the assessment and treatment plan with the patient. The patient was provided an opportunity to ask questions and all were answered. The patient agreed with the plan and demonstrated an understanding of the instructions.   The patient was advised to call back or seek an in-person evaluation  if the symptoms worsen or if the condition fails to improve as anticipated.  I provided 12 minutes of non-face-to-face time during this encounter.  The entirety of the information documented in the History of Present Illness, Review of Systems and Physical Exam were personally obtained by me. Portions of this information were initially documented by the CMA and reviewed by me for thoroughness and accuracy.    [name], MD [facility] [phone] (phone) [phone] (fax)  [place]

## 2021-12-25 DIAGNOSIS — F332 Major depressive disorder, recurrent severe without psychotic features: Secondary | ICD-10-CM | POA: Diagnosis not present

## 2021-12-26 ENCOUNTER — Other Ambulatory Visit: Payer: Self-pay | Admitting: Family Medicine

## 2021-12-26 NOTE — Telephone Encounter (Signed)
Last refill: 12/16/2021 #60 with 0 refills  Last office visit: 12/24/2021 Next office visit:  02/11/2022

## 2021-12-27 ENCOUNTER — Other Ambulatory Visit: Payer: Self-pay | Admitting: Psychiatry

## 2021-12-27 ENCOUNTER — Telehealth: Payer: Self-pay | Admitting: Family Medicine

## 2021-12-27 ENCOUNTER — Other Ambulatory Visit: Payer: Self-pay | Admitting: Family Medicine

## 2021-12-27 DIAGNOSIS — Z79899 Other long term (current) drug therapy: Secondary | ICD-10-CM

## 2021-12-27 MED ORDER — OXYCODONE-ACETAMINOPHEN 7.5-325 MG PO TABS: 1.0000 | ORAL_TABLET | ORAL | 0 refills | Status: DC | PRN

## 2021-12-27 NOTE — Telephone Encounter (Signed)
Referral Request - Has patient seen PCP for this complaint? Yes.   (Per pt)  Referral for which specialty: Urology  Preferred provider/office: [facility] phone [phone] email [email or web address]   Reason for referral: n/a

## 2021-12-28 NOTE — Telephone Encounter (Signed)
Need more information. What is the medical reason for referral to urology.

## 2021-12-28 NOTE — Telephone Encounter (Signed)
Pt is wanting referral for a surgical consult for excess skin in groin area causing skin problems and infection. 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  He is to see Dr. Mayer Camel who he states does this type of procedure at [facility].

## 2022-01-02 DIAGNOSIS — F341 Dysthymic disorder: Secondary | ICD-10-CM | POA: Diagnosis not present

## 2022-01-02 DIAGNOSIS — F411 Generalized anxiety disorder: Secondary | ICD-10-CM | POA: Diagnosis not present

## 2022-01-02 DIAGNOSIS — G4700 Insomnia, unspecified: Secondary | ICD-10-CM | POA: Diagnosis not present

## 2022-01-02 DIAGNOSIS — F515 Nightmare disorder: Secondary | ICD-10-CM | POA: Diagnosis not present

## 2022-01-03 ENCOUNTER — Other Ambulatory Visit: Payer: Self-pay | Admitting: Family Medicine

## 2022-01-03 DIAGNOSIS — F332 Major depressive disorder, recurrent severe without psychotic features: Secondary | ICD-10-CM | POA: Diagnosis not present

## 2022-01-04 MED ORDER — OXYCODONE-ACETAMINOPHEN 7.5-325 MG PO TABS: 1.0000 | ORAL_TABLET | ORAL | 0 refills | Status: DC | PRN

## 2022-01-08 ENCOUNTER — Other Ambulatory Visit: Payer: Self-pay | Admitting: Family Medicine

## 2022-01-08 ENCOUNTER — Other Ambulatory Visit: Payer: Self-pay

## 2022-01-08 DIAGNOSIS — E1165 Type 2 diabetes mellitus with hyperglycemia: Secondary | ICD-10-CM

## 2022-01-08 DIAGNOSIS — R112 Nausea with vomiting, unspecified: Secondary | ICD-10-CM

## 2022-01-08 DIAGNOSIS — E782 Mixed hyperlipidemia: Secondary | ICD-10-CM

## 2022-01-08 MED ORDER — ROSUVASTATIN CALCIUM 20 MG PO TABS: 20.0000 mg | ORAL_TABLET | Freq: Every day | ORAL | 4 refills | Status: DC

## 2022-01-08 MED ORDER — LEVEMIR FLEXPEN 100 UNIT/ML ~~LOC~~ SOPN: 54.0000 [IU] | PEN_INJECTOR | Freq: Every day | SUBCUTANEOUS | 3 refills | Status: DC

## 2022-01-08 MED ORDER — PROCHLORPERAZINE MALEATE 10 MG PO TABS: ORAL_TABLET | ORAL | 3 refills | Status: DC

## 2022-01-08 NOTE — Telephone Encounter (Signed)
[facility] faxed refill request for the following medications:  prochlorperazine (COMPAZINE) 10 MG tablet   rosuvastatin (CRESTOR) 20 MG tablet   insulin detemir (LEVEMIR FLEXPEN) 100 UNIT/ML FlexPen   Please advise.

## 2022-01-08 NOTE — Addendum Note (Signed)
Addended by: Julieta Bellini on: 01/08/2022 10:07 AM   Modules accepted: Orders

## 2022-01-08 NOTE — Telephone Encounter (Signed)
Rx's sent to pharmacy.  

## 2022-01-11 ENCOUNTER — Other Ambulatory Visit: Payer: Self-pay | Admitting: Physician Assistant

## 2022-01-11 ENCOUNTER — Other Ambulatory Visit: Payer: Self-pay | Admitting: Family Medicine

## 2022-01-11 DIAGNOSIS — E1110 Type 2 diabetes mellitus with ketoacidosis without coma: Secondary | ICD-10-CM

## 2022-01-11 DIAGNOSIS — N390 Urinary tract infection, site not specified: Secondary | ICD-10-CM

## 2022-01-11 NOTE — Progress Notes (Signed)
Pt called and informed.

## 2022-01-11 NOTE — Telephone Encounter (Signed)
T called today asking for the status of the urology referral.  [phone]

## 2022-01-11 NOTE — Telephone Encounter (Signed)
Raymond Rose, this has been sitting a while.  Could you send referral for pt?

## 2022-01-11 NOTE — Progress Notes (Unsigned)
sent 

## 2022-01-14 ENCOUNTER — Other Ambulatory Visit: Payer: Self-pay | Admitting: Family Medicine

## 2022-01-14 NOTE — Telephone Encounter (Signed)
Referral sent and pt informed.

## 2022-01-15 NOTE — Telephone Encounter (Signed)
Pt state he just called [facility] and they still do not have a referral.   Please advise.

## 2022-01-15 NOTE — Telephone Encounter (Signed)
Last refill: 01/04/2022 qty 60 tablets with 0 refills  Last office visit: 12/24/2021 (video visit for other health issues) Next office visit: 02/11/2022

## 2022-01-15 NOTE — Telephone Encounter (Signed)
Raymond Rose, would you check on this please?  Thank you!

## 2022-01-16 DIAGNOSIS — F332 Major depressive disorder, recurrent severe without psychotic features: Secondary | ICD-10-CM | POA: Diagnosis not present

## 2022-01-16 MED ORDER — OXYCODONE-ACETAMINOPHEN 7.5-325 MG PO TABS
1.0000 | ORAL_TABLET | ORAL | 0 refills | Status: DC | PRN
Start: 2022-01-16 — End: 2022-01-24

## 2022-01-18 DIAGNOSIS — E8729 Other acidosis: Secondary | ICD-10-CM | POA: Diagnosis not present

## 2022-01-18 DIAGNOSIS — Z794 Long term (current) use of insulin: Secondary | ICD-10-CM | POA: Diagnosis not present

## 2022-01-18 DIAGNOSIS — F17290 Nicotine dependence, other tobacco product, uncomplicated: Secondary | ICD-10-CM | POA: Diagnosis not present

## 2022-01-18 DIAGNOSIS — R112 Nausea with vomiting, unspecified: Secondary | ICD-10-CM | POA: Diagnosis not present

## 2022-01-18 DIAGNOSIS — E1165 Type 2 diabetes mellitus with hyperglycemia: Secondary | ICD-10-CM | POA: Diagnosis not present

## 2022-01-18 DIAGNOSIS — F1290 Cannabis use, unspecified, uncomplicated: Secondary | ICD-10-CM | POA: Diagnosis not present

## 2022-01-18 DIAGNOSIS — R1115 Cyclical vomiting syndrome unrelated to migraine: Secondary | ICD-10-CM | POA: Diagnosis not present

## 2022-01-21 NOTE — Addendum Note (Signed)
Addended byMatilde Sprang on: 01/21/2022 12:09 PM   Modules accepted: Orders

## 2022-01-21 NOTE — Addendum Note (Signed)
Addended by: Matilde Sprang on: 01/21/2022 12:11 PM   Modules accepted: Orders

## 2022-01-21 NOTE — Telephone Encounter (Signed)
Pt called saying [facility] has sent [place] a message and they are waiting on a response before they send him the medication.  He said he is running very low.  He said the insurance told him the office needs to call them.

## 2022-01-21 NOTE — Telephone Encounter (Signed)
Referral Request - Has patient seen PCP for this complaint? Yes Patient waitng on status of referral since 08/01 *If NO, is insurance requiring patient see PCP for this issue before PCP can refer them? Referral for which specialty: Urology Preferred provider/office: [facility].  Reason for referral: Pt is wanting referral for a surgical consult for excess skin in groin area causing skin problems and infection. 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  He is to see Dr. Mayer Camel who he states does this type of procedure at [facility].

## 2022-01-21 NOTE — Telephone Encounter (Addendum)
[place] called and spoke to [facility], Patient Advocate about the message from the patient on the medications sent on 01/08/22, Compazine, Crestor, Levemir flexpen. She transferred me to [facility], Provider Care Advocate who says Levemir Flexpen requires a PA, patient may pay out of pocket at local pharmacy in the meantime and there will be a reimbursement the cost. Compazine was shipped a 1 month supply on 01/09/22 and delivered on 01/12/22 in or at the mailbox, he will need to call the member services to request a refill. Rosuvastatin covered with estimated cost of $25. They did not receive it, so it will need to be resent. She transferred me to the PA team, spoke Gurpreet, CCRD to check on the status of the PA since it's been 8 days and still in process. She says the Levemir Flexpen is waiting on criteria questions to be answered by Dr. Caryn Section, faxed to [phone] to fax back to [phone] case ID: [id number].   Patient called to advise to call the pharmacy member services for a refill on prochlorperazine (Compazine) 10 mg, no answer, mailbox is full.

## 2022-01-22 ENCOUNTER — Telehealth: Payer: Self-pay | Admitting: *Deleted

## 2022-01-22 NOTE — Telephone Encounter (Signed)
Copied from [place] [id number]. Topic: General - Other >> Jan 21, 2022 11:20 AM Cyndi Bender wrote: Reason for CRM: Pt requests that someone from the clinical staff call [facility] @ [phone] regarding referral.

## 2022-01-24 ENCOUNTER — Other Ambulatory Visit: Payer: Self-pay | Admitting: Family Medicine

## 2022-01-24 ENCOUNTER — Other Ambulatory Visit: Payer: Self-pay | Admitting: Psychiatry

## 2022-01-24 DIAGNOSIS — F331 Major depressive disorder, recurrent, moderate: Secondary | ICD-10-CM

## 2022-01-27 MED ORDER — OXYCODONE-ACETAMINOPHEN 7.5-325 MG PO TABS
1.0000 | ORAL_TABLET | ORAL | 0 refills | Status: DC | PRN
Start: 2022-01-27 — End: 2022-02-08

## 2022-01-30 ENCOUNTER — Telehealth: Payer: Self-pay

## 2022-01-30 NOTE — Telephone Encounter (Signed)
Dr. Caryn Section, please advise  Pt has called 3 times asking about his referral to [facility]/Dr. Mayer Camel. Janna sent referral in your absence.  I called their office and they are saying they still have not received referral. I inquired as to what I needed to send over so I could get it done myself and they are needed an office note with dx and documentation for buried penis but I cannot find anything in pt notes.  Do you happen to remember ever seeing pt for this or does he actually need an appt?

## 2022-01-30 NOTE — Telephone Encounter (Signed)
Copied from [place] [id number]. Topic: Referral - Status >> Jan 30, 2022 10:50 AM Cyndi Bender wrote: Reason for CRM: Pt requests that the clinical staff call [facility] @ [phone] regarding referral

## 2022-01-30 NOTE — Telephone Encounter (Signed)
[facility] is requiring documentation from office visit documenting medical necessity. He is also overdue for diabetes follow up. He needs to schedule appt. He can have a SameDay appointment next week.

## 2022-01-31 NOTE — Telephone Encounter (Signed)
Spoke with patient, rescheduled his appt for 8/22.

## 2022-02-05 ENCOUNTER — Encounter: Payer: Self-pay | Admitting: Family Medicine

## 2022-02-05 ENCOUNTER — Ambulatory Visit (INDEPENDENT_AMBULATORY_CARE_PROVIDER_SITE_OTHER): Payer: Commercial Managed Care - HMO | Admitting: Family Medicine

## 2022-02-05 VITALS — BP 110/80 | HR 80 | Temp 98.0°F | Wt 296.0 lb

## 2022-02-05 DIAGNOSIS — E1110 Type 2 diabetes mellitus with ketoacidosis without coma: Secondary | ICD-10-CM | POA: Diagnosis not present

## 2022-02-05 DIAGNOSIS — F1190 Opioid use, unspecified, uncomplicated: Secondary | ICD-10-CM | POA: Diagnosis not present

## 2022-02-05 DIAGNOSIS — Q5564 Hidden penis: Secondary | ICD-10-CM | POA: Diagnosis not present

## 2022-02-05 LAB — POCT GLYCOSYLATED HEMOGLOBIN (HGB A1C)
Est. average glucose Bld gHb Est-mCnc: 263
Hemoglobin A1C: 10.8 % — AB (ref 4.0–5.6)

## 2022-02-05 MED ORDER — TIRZEPATIDE 2.5 MG/0.5ML ~~LOC~~ SOAJ: 2.5000 mg | SUBCUTANEOUS | 0 refills | Status: DC

## 2022-02-05 NOTE — Progress Notes (Signed)
I,Raymond Rose,acting as a scribe for Raymond Huh, MD.,have documented all relevant documentation on the behalf of Raymond Huh, MD,as directed by  Raymond Huh, MD while in the presence of Raymond Huh, MD.    Established patient visit   Patient: Raymond Rose   DOB: Dec 30, 1975   46 y.o. Male  MRN: [id number] Visit Date: 02/05/2022  Today's healthcare provider: [name], MD   Chief Complaint  Patient presents with   Follow-up   Diabetes   Subjective    HPI  Follow up ER visit  Patient was seen in ER for nausea and vomiting, and Diabetes Mellitus on 01/18/2022. Labs were ordered showing  very mild ketoacidosis. He was given IV fluids and insulin while in the ER.  Phenergan and Toradol was also administered. He reports good compliance with treatment. He reports this condition is Unchanged.  -----------------------------------------------------------------------------------------   Diabetes Mellitus Type II, Follow-up  Lab Results  Component Value Date   HGBA1C 10.8 (A) 02/05/2022   HGBA1C 11.2 (A) 11/09/2021   HGBA1C 10.7 (A) 09/03/2021   Wt Readings from Last 3 Encounters:  02/05/22 296 lb (134.3 kg)  11/09/21 (!) 308 lb (139.7 kg)  10/09/21 (!) 317 lb 9.6 oz (144.1 kg)   Last seen for diabetes 11/09/2021.  Management since then includes adding pioglitazone (ACTOS) 30 MG tablet; Take 1 tablet (30 mg total) by mouth daily. He reports good compliance with treatment. He is not having side effects.  Symptoms: Yes fatigue No foot ulcerations  No appetite changes Yes nausea  No paresthesia of the feet  Yes polydipsia  Yes polyuria No visual disturbances   Yes vomiting     Home blood sugar records: fasting range: low 200's  Episodes of hypoglycemia? No    Current insulin regiment: Novolog 20 units three times a day, Levemir 54 units daily Most Recent Eye Exam: >1 year ago Current exercise: none Current diet habits: in general, an  "unhealthy" diet  Pertinent Labs: Lab Results  Component Value Date   CHOL 140 10/03/2020   HDL 37 (L) 10/03/2020   LDLCALC 62 10/03/2020   TRIG 259 (H) 10/03/2020   CHOLHDL 3.8 10/03/2020   Lab Results  Component Value Date   NA 135 12/04/2020   K 4.7 12/04/2020   CREATININE 0.70 (L) 06/22/2021   EGFR 117 06/22/2021   MICROALBUR 20 03/05/2021   LABMICR 6.7 09/03/2021     ---------------------------------------------------------------------------------------------------   Urinary problems: Patient is requesting a referral to [facility] urology. He has difficulty urinated due to penile shaft being buried in surround skin of lower abdomen. Causes frequent skin irritation.   Medications: Outpatient Medications Prior to Visit  Medication Sig   ALPRAZolam (XANAX) 0.5 MG tablet TAKE 1 TABLET BY MOUTH EVERY 4 HOURS AS NEEDE FOR ANXIETY   BD PEN NEEDLE NANO 2ND GEN 32G X 4 MM MISC USE AS DIRECTED 4 TIMES A DAY   blood glucose meter kit and supplies KIT Dispense based on patient and insurance preference. Use up to four times daily as directed. (FOR ICD-9 250.00, 250.01).   Dulaglutide 3 MG/0.5ML SOPN Inject 3 mg into the skin once a week.   Eszopiclone 3 MG TABS TAKE 1 TABLET BY MOUTH AT BEDTIME TAKE IMMEDIATELY BEFORE BEDTIME   fluconazole (DIFLUCAN) 150 MG tablet TAKE 1 TABLET BY MOUTH AS ONE DOSE   hydrOXYzine (VISTARIL) 50 MG capsule Take 1-2 capsules (50-100 mg total) by mouth daily as needed.   insulin aspart (NOVOLOG FLEXPEN)  100 UNIT/ML FlexPen Inject up to 20 units three times daily as directed by physician   insulin detemir (LEVEMIR FLEXPEN) 100 UNIT/ML FlexPen Inject 54 Units into the skin daily.   ketoconazole (NIZORAL) 2 % cream APPLY TO AFFECTED AREA ONCE DAILY FOR 7 DAYS AS NEEDED (Patient taking differently: Apply 1 application  topically daily. APPLY TO AFFECTED AREA ONCE DAILY FOR 7 DAYS AS NEEDED)   lamoTRIgine (LAMICTAL) 100 MG tablet TAKE 1.5 TABLETS (150MG TOTAL) BY MOUTH  DAILY   lithium carbonate 300 MG capsule Take 1-2 capsules (300-600 mg total) by mouth as directed. START TAKING 1 CAP DAILY AM AND 2 CAPS DAILY PM   metFORMIN (GLUCOPHAGE-XR) 500 MG 24 hr tablet TAKE 2 TABLETS BY MOUTH TWICE A DAY   omeprazole (PRILOSEC) 40 MG capsule TAKE 1 CAPSULE BY MOUTH EVERY DAY   oxyCODONE-acetaminophen (PERCOCET) 7.5-325 MG tablet Take 1 tablet by mouth every 4 (four) hours as needed.   pioglitazone (ACTOS) 30 MG tablet Take 1 tablet (30 mg total) by mouth daily.   prazosin (MINIPRESS) 2 MG capsule Take 1 capsule (2 mg total) by mouth at bedtime.   prochlorperazine (COMPAZINE) 10 MG tablet TAKE 1/2 - 1 TABLETS BY MOUTH EVERY 6 (SIX) HOURS AS NEEDED FOR NAUSEA OR VOMITING.   rosuvastatin (CRESTOR) 20 MG tablet Take 1 tablet (20 mg total) by mouth daily.   sildenafil (VIAGRA) 50 MG tablet TAKE 1 TO 2 TABLETS BY MOUTH DAILY AS NEEDED   tamsulosin (FLOMAX) 0.4 MG CAPS capsule Take 0.4 mg by mouth daily.   Vilazodone HCl (VIIBRYD) 40 MG TABS Take 1 tablet (40 mg total) by mouth daily.   No facility-administered medications prior to visit.    Review of Systems  Constitutional:  Positive for fatigue. Negative for appetite change, chills and fever.  Respiratory:  Negative for chest tightness, shortness of breath and wheezing.   Cardiovascular:  Negative for chest pain and palpitations.  Gastrointestinal:  Positive for nausea and vomiting. Negative for abdominal pain.  Endocrine: Positive for polydipsia and polyuria.       Objective    BP 110/80 (BP Location: Left Arm, Patient Position: Sitting, Cuff Size: Large)   Pulse 80   Temp 98 F (36.7 C) (Oral)   Wt 296 lb (134.3 kg)   BMI 39.05 kg/m    Physical Exam    General appearance: Obese male, cooperative and in no acute distress Head: Normocephalic, without obvious abnormality, atraumatic Respiratory: Respirations even and unlabored, normal respiratory rate Extremities: All extremities are intact.  Skin:  Skin color, texture, turgor normal. No rashes seen  Psych: Appropriate mood and affect. GU: Excessive lower abdominal skin around penis which is completely engulfed in surrounding irritated skin folds. Mental status: Alert, oriented to person, place, and time, thought content appropriate.   Results for orders placed or performed in visit on 02/05/22  POCT HgB A1C  Result Value Ref Range   Hemoglobin A1C 10.8 (A) 4.0 - 5.6 %   Est. average glucose Bld gHb Est-mCnc 263     Assessment & Plan     1. Type 2 diabetes mellitus with ketoacidosis without coma, without long-term current use of insulin (HCC) Uncontrolled. He does have cyclic vomiting syndrome which did not improved when we had him stop Trulicity in the past. He is now back on 65m Trulicity with minimal improvement in his sugars. He would likely [facility] efficac[facility] and less likely to cause nausea in my  experience. Will start with 2.54m weekly and titrate up to 540mwithin a month if covered by his insurance.   2. Chronic, continuous use of opioids Stable opioid use which remains effective for his otherwise incapacitating back pain.  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 Refer Dr. FiOtho Najjarrology who patient has researched and is known to treat this condition.      The entirety of the information documented in the History of Present Illness, Review of Systems and Physical Exam were personally obtained by me. Portions of this information were initially documented by the CMA and reviewed by me for thoroughness and accuracy.     DoLelon HuhMD  [phone]) [phone])  [place]

## 2022-02-06 LAB — PAIN MGT SCRN (14 DRUGS), UR
Amphetamine Scrn, Ur: NEGATIVE ng/mL
BARBITURATE SCREEN URINE: NEGATIVE ng/mL
BENZODIAZEPINE SCREEN, URINE: NEGATIVE ng/mL
Buprenorphine, Urine: NEGATIVE ng/mL
CANNABINOIDS UR QL SCN: POSITIVE ng/mL — AB
Cocaine (Metab) Scrn, Ur: NEGATIVE ng/mL
Creatinine(Crt), U: 208.3 mg/dL (ref 20.0–300.0)
Fentanyl, Urine: NEGATIVE pg/mL
Meperidine Screen, Urine: NEGATIVE ng/mL
Methadone Screen, Urine: NEGATIVE ng/mL
OXYCODONE+OXYMORPHONE UR QL SCN: NEGATIVE ng/mL
Opiate Scrn, Ur: NEGATIVE ng/mL
Ph of Urine: 5.3 (ref 4.5–8.9)
Phencyclidine Qn, Ur: NEGATIVE ng/mL
Propoxyphene Scrn, Ur: NEGATIVE ng/mL
Tramadol Screen, Urine: NEGATIVE ng/mL

## 2022-02-07 ENCOUNTER — Other Ambulatory Visit: Payer: Self-pay | Admitting: Family Medicine

## 2022-02-07 LAB — HM DIABETES EYE EXAM

## 2022-02-08 ENCOUNTER — Other Ambulatory Visit: Payer: Self-pay | Admitting: Family Medicine

## 2022-02-08 NOTE — Telephone Encounter (Signed)
Requested medication (s) are due for refill today: no  Requested medication (s) are on the active medication list: yes  Last refill:  01/27/22 #60/0  Future visit scheduled: no  Notes to clinic:  Unable to refill per protocol, cannot delegate.    Requested Prescriptions  Pending Prescriptions Disp Refills   oxyCODONE-acetaminophen (PERCOCET) 7.5-325 MG tablet 60 tablet 0    Sig: Take 1 tablet by mouth every 4 (four) hours as needed.     Not Delegated - Analgesics:  Opioid Agonist Combinations Failed - 02/08/2022 11:59 AM      Failed - This refill cannot be delegated      Failed - Urine Drug Screen completed in last 360 days      Passed - Valid encounter within last 3 months    Recent Outpatient Visits           3 days ago Type 2 diabetes mellitus with ketoacidosis without coma, without long-term current use of insulin [facility])   [facility] Birdie Sons, MD   1 month ago Cyclic vomiting syndrome   [facility] Birdie Sons, MD   3 months ago Uncontrolled type 2 diabetes mellitus with hyperglycemia [facility])   [facility] Birdie Sons, MD   3 months ago MDD (major depressive disorder), recurrent episode, mild [facility])   [facility] Birdie Sons, MD   4 months ago Depression with anxiety   [facility] Caryn Section, Kirstie Peri, MD

## 2022-02-08 NOTE — Telephone Encounter (Signed)
Medication Refill - Medication: oxyCODONE-acetaminophen (PERCOCET) 7.5-325 MG tablet   Has the patient contacted their pharmacy? yes (Agent: If no, request that the patient contact the pharmacy for the refill. If patient does not wish to contact the pharmacy document the reason why and proceed with request.) (Agent: If yes, when and what did the pharmacy advise?)denied because states already requested on 08/13. Patient says he normally request this every 10 days  Preferred Pharmacy (with phone number or street name):  [facility] [address] [facility] - [place], [place] [employer] AT [address] Phone:  [phone]  Fax:  [phone]     Has the patient been seen for an appointment in the last year OR does the patient have an upcoming appointment? yes  Agent: Please be advised that RX refills may take up to 3 business days. We ask that you follow-up with your pharmacy.

## 2022-02-09 MED ORDER — OXYCODONE-ACETAMINOPHEN 7.5-325 MG PO TABS
1.0000 | ORAL_TABLET | ORAL | 0 refills | Status: DC | PRN
Start: 2022-02-09 — End: 2022-02-21

## 2022-02-11 ENCOUNTER — Ambulatory Visit: Payer: 59 | Admitting: Family Medicine

## 2022-02-19 ENCOUNTER — Telehealth (INDEPENDENT_AMBULATORY_CARE_PROVIDER_SITE_OTHER): Payer: Commercial Managed Care - HMO | Admitting: Family Medicine

## 2022-02-19 DIAGNOSIS — B86 Scabies: Secondary | ICD-10-CM

## 2022-02-19 MED ORDER — PERMETHRIN 5 % EX CREA: 1.0000 | TOPICAL_CREAM | Freq: Once | CUTANEOUS | 1 refills | Status: AC

## 2022-02-19 NOTE — Progress Notes (Signed)
I,[name],acting as a scribe for [name], MD.,have documented all relevant documentation on the behalf of [name], MD,as directed by  [name], MD while in the presence of [name], MD.  MyChart Video Visit    Virtual Visit via Video Note   This format is felt to be most appropriate for this patient at this time. Physical exam was limited by quality of the video and audio technology used for the visit.   Patient location: [facility]  Provider location: home   I discussed the limitations of evaluation and management by telemedicine and the availability of in person appointments. The patient expressed understanding and agreed to proceed.  Patient: Raymond Rose   DOB: Dec 27, 1975   46 y.o. Male  MRN: [id number] Visit Date: 02/19/2022  Today's healthcare provider: [name], MD   No chief complaint on file.  Subjective    Patient reports large cluster of blisters on arm/left and moving onto hand,shoulder and forehead.  Onset 1 week ago. Wife has same in similar areas. Reports redness, severe itching and mild burning. No drainage.  Applying Cortisone ointment with no relief.  Concerned it may be scabies. Started on left forearm then spread to fingers and forearm. S   Medications: Outpatient Medications Prior to Visit  Medication Sig   ALPRAZolam (XANAX) 0.5 MG tablet TAKE 1 TABLET BY MOUTH EVERY 4 HOURS AS NEEDE FOR ANXIETY   BD PEN NEEDLE NANO 2ND GEN 32G X 4 MM MISC USE AS DIRECTED 4 TIMES A DAY   blood glucose meter kit and supplies KIT Dispense based on patient and insurance preference. Use up to four times daily as directed. (FOR ICD-9 250.00, 250.01).   Eszopiclone 3 MG TABS TAKE 1 TABLET BY MOUTH AT BEDTIME TAKE IMMEDIATELY BEFORE BEDTIME   fluconazole (DIFLUCAN) 150 MG tablet TAKE 1 TABLET BY MOUTH AS ONE DOSE   hydrOXYzine (VISTARIL) 50 MG capsule Take 1-2 capsules (50-100 mg total) by mouth daily as needed.   insulin aspart (NOVOLOG  FLEXPEN) 100 UNIT/ML FlexPen Inject up to 20 units three times daily as directed by physician   insulin detemir (LEVEMIR FLEXPEN) 100 UNIT/ML FlexPen Inject 54 Units into the skin daily.   ketoconazole (NIZORAL) 2 % cream APPLY TO AFFECTED AREA ONCE DAILY FOR 7 DAYS AS NEEDED (Patient taking differently: Apply 1 application  topically daily. APPLY TO AFFECTED AREA ONCE DAILY FOR 7 DAYS AS NEEDED)   lamoTRIgine (LAMICTAL) 100 MG tablet TAKE 1.5 TABLETS (150MG TOTAL) BY MOUTH DAILY   lithium carbonate 300 MG capsule Take 1-2 capsules (300-600 mg total) by mouth as directed. START TAKING 1 CAP DAILY AM AND 2 CAPS DAILY PM   metFORMIN (GLUCOPHAGE-XR) 500 MG 24 hr tablet TAKE 2 TABLETS BY MOUTH TWICE A DAY   omeprazole (PRILOSEC) 40 MG capsule TAKE 1 CAPSULE BY MOUTH EVERY DAY   oxyCODONE-acetaminophen (PERCOCET) 7.5-325 MG tablet Take 1 tablet by mouth every 4 (four) hours as needed.   pioglitazone (ACTOS) 30 MG tablet Take 1 tablet (30 mg total) by mouth daily.   prazosin (MINIPRESS) 2 MG capsule Take 1 capsule (2 mg total) by mouth at bedtime.   prochlorperazine (COMPAZINE) 10 MG tablet TAKE 1/2 - 1 TABLETS BY MOUTH EVERY 6 (SIX) HOURS AS NEEDED FOR NAUSEA OR VOMITING.   rosuvastatin (CRESTOR) 20 MG tablet Take 1 tablet (20 mg total) by mouth daily.   sildenafil (VIAGRA) 50 MG tablet TAKE 1 TO 2 TABLETS BY MOUTH DAILY AS NEEDED  tamsulosin (FLOMAX) 0.4 MG CAPS capsule Take 0.4 mg by mouth daily.   tirzepatide [facility]) 2.5 MG/0.5ML Pen Inject 2.5 mg into the skin once a week.   Vilazodone HCl (VIIBRYD) 40 MG TABS Take 1 tablet (40 mg total) by mouth daily.   No facility-administered medications prior to visit.    Review of Systems     Objective    There were no vitals taken for this visit.     Physical Exam   Scattered raised lesions arms, chest and interdigital area of both hands c/w scabies.    Assessment & Plan     1. Scabies  - permethrin (ELIMITE) 5 % cream; Apply 1  Application topically once for 1 dose. RinRinse off after eight hours. Repeat in 14 days if neededse off after eight hours. May repeat in 14 days if needed  Dispense: 60 g; Refill: 1      I discussed the assessment and treatment plan with the patient. The patient was provided an opportunity to ask questions and all were answered. The patient agreed with the plan and demonstrated an understanding of the instructions.   The patient was advised to call back or seek an in-person evaluation if the symptoms worsen or if the condition fails to improve as anticipated.  I provided 12 minutes of non-face-to-face time during this encounter.  The entirety of the information documented in the History of Present Illness, Review of Systems and Physical Exam were personally obtained by me. Portions of this information were initially documented by the CMA and reviewed by me for thoroughness and accuracy.    [name], MD [facility] [phone] (phone) [phone] (fax)  [place]

## 2022-02-21 ENCOUNTER — Other Ambulatory Visit: Payer: Self-pay | Admitting: Family Medicine

## 2022-02-21 ENCOUNTER — Encounter: Payer: Self-pay | Admitting: Family Medicine

## 2022-02-24 ENCOUNTER — Other Ambulatory Visit: Payer: Self-pay | Admitting: Family Medicine

## 2022-02-24 NOTE — Telephone Encounter (Signed)
{  Template Exceptions:1}  

## 2022-02-25 MED ORDER — OXYCODONE-ACETAMINOPHEN 7.5-325 MG PO TABS: 1.0000 | ORAL_TABLET | ORAL | 0 refills | Status: DC | PRN

## 2022-02-28 ENCOUNTER — Other Ambulatory Visit: Payer: Self-pay | Admitting: Family Medicine

## 2022-02-28 DIAGNOSIS — F419 Anxiety disorder, unspecified: Secondary | ICD-10-CM

## 2022-03-01 ENCOUNTER — Other Ambulatory Visit: Payer: Self-pay | Admitting: Family Medicine

## 2022-03-06 ENCOUNTER — Encounter: Payer: Self-pay | Admitting: Family Medicine

## 2022-03-06 ENCOUNTER — Telehealth: Payer: Self-pay | Admitting: Family Medicine

## 2022-03-06 DIAGNOSIS — R112 Nausea with vomiting, unspecified: Secondary | ICD-10-CM

## 2022-03-06 DIAGNOSIS — F419 Anxiety disorder, unspecified: Secondary | ICD-10-CM

## 2022-03-06 MED ORDER — ALPRAZOLAM 0.5 MG PO TABS: ORAL_TABLET | ORAL | 0 refills | Status: DC

## 2022-03-06 MED ORDER — PROCHLORPERAZINE MALEATE 10 MG PO TABS: ORAL_TABLET | ORAL | 3 refills | Status: DC

## 2022-03-06 NOTE — Telephone Encounter (Signed)
Patient brother had a heart attack and he left his medication. Patient would like PCP to send a 3 day supply of xanax and phenergan to the below pharmacy.    [facility] [address] [place] [address]  [phone]

## 2022-03-06 NOTE — Telephone Encounter (Signed)
Please review. Phenergan is not listed on active medication list.

## 2022-03-06 NOTE — Telephone Encounter (Signed)
Pt and his spouse called back in to follow up on request to have supply sent in to pharmacy below. Spouse says that pt is in need of this medication as soon as possible and would like a call back for further assistance.      [phone] pt.   [phone] pt's spouse - [name]

## 2022-03-08 ENCOUNTER — Other Ambulatory Visit: Payer: Self-pay | Admitting: Family Medicine

## 2022-03-09 MED ORDER — OXYCODONE-ACETAMINOPHEN 7.5-325 MG PO TABS: 1.0000 | ORAL_TABLET | ORAL | 0 refills | Status: DC | PRN

## 2022-03-12 ENCOUNTER — Ambulatory Visit: Payer: Commercial Managed Care - HMO | Admitting: Family Medicine

## 2022-03-18 ENCOUNTER — Other Ambulatory Visit: Payer: Self-pay | Admitting: Family Medicine

## 2022-03-20 ENCOUNTER — Other Ambulatory Visit: Payer: Self-pay | Admitting: Family Medicine

## 2022-03-20 ENCOUNTER — Telehealth: Payer: Self-pay | Admitting: Family Medicine

## 2022-03-20 NOTE — Telephone Encounter (Signed)
The patient called in stating because he change insurance to [place] his health insurance requires a prior authorization for his insulin detemir (LEVEMIR FLEXPEN) 100 UNIT/ML FlexPen. Please assist patient further

## 2022-03-21 MED ORDER — OXYCODONE-ACETAMINOPHEN 7.5-325 MG PO TABS: 1.0000 | ORAL_TABLET | ORAL | 0 refills | Status: DC | PRN

## 2022-03-21 NOTE — Telephone Encounter (Signed)
Haven't seen a PA. Pharmacy needs to send info for PA

## 2022-03-22 ENCOUNTER — Other Ambulatory Visit: Payer: Self-pay | Admitting: Family Medicine

## 2022-03-22 MED ORDER — LANTUS SOLOSTAR 100 UNIT/ML ~~LOC~~ SOPN: 54.0000 [IU] | PEN_INJECTOR | Freq: Every day | SUBCUTANEOUS | 5 refills | Status: DC

## 2022-03-22 NOTE — Telephone Encounter (Signed)
Just spoke with this patient. I let him know that I am unable to find his current insurance information but will work on a PA, if noone else does, when we have that. He said he will upload it to his chart. Reached out to pharmacy also and they said they will fax over a PA for him again.

## 2022-03-22 NOTE — Telephone Encounter (Signed)
Prior auth started via covermy meds. I received the following message:  The drug you are requesting prior authorization for is not covered. Please select an alternative drug from the choices provided and send in a new prescription for that drug. You may also select "Complete PA" to obtain a PA for the originally requested drug.  LANTUS SOLOSTAR PEN 3ML 5'S 100U/ML  TOUJEO SOLOSTAR PEN 1.5ML 3'S 300U/ML  TOUJEO MAX SOLOSTAR PEN 3ML2S 300U/ML  Complete PA   Patient's insurance is requesting that the medication be changed.

## 2022-03-22 NOTE — Telephone Encounter (Signed)
Pt called in very upset checking status of PA  Pt states he was suppose to receive a call back on 10-04  Advised pt of provider's message below  Pt states pharmacy has sent over PA information twice and "someone better find it"  Pt states he is out of insulin and requesting a cb

## 2022-03-22 NOTE — Telephone Encounter (Signed)
Just called pharmacy, they are faxing a PA request. I tried to do a PA online with the insurance we have but it is not his updated plan and will not go through.

## 2022-03-23 ENCOUNTER — Other Ambulatory Visit: Payer: Self-pay | Admitting: Psychiatry

## 2022-03-23 DIAGNOSIS — F411 Generalized anxiety disorder: Secondary | ICD-10-CM

## 2022-03-28 MED ORDER — INSULIN LISPRO (1 UNIT DIAL) 100 UNIT/ML (KWIKPEN): PEN_INJECTOR | SUBCUTANEOUS | 4 refills | Status: DC

## 2022-03-28 NOTE — Telephone Encounter (Signed)
Raymond Rose, CMA to Me       03/28/22  4:56 PM  Patient is requesting that his insulin aspart (NOVOLOG FLEXPEN) 100 UNIT/ML FlexPen be changed to Humalog due to it being cheaper per month with insurance coverage. [id number] [place] [place]

## 2022-03-28 NOTE — Addendum Note (Signed)
Addended by: Birdie Sons on: 03/28/2022 05:17 PM   Modules accepted: Orders

## 2022-04-01 ENCOUNTER — Other Ambulatory Visit: Payer: Self-pay | Admitting: Family Medicine

## 2022-04-02 MED ORDER — OXYCODONE-ACETAMINOPHEN 7.5-325 MG PO TABS: 1.0000 | ORAL_TABLET | ORAL | 0 refills | Status: DC | PRN

## 2022-04-10 ENCOUNTER — Other Ambulatory Visit: Payer: Self-pay | Admitting: Family Medicine

## 2022-04-11 ENCOUNTER — Other Ambulatory Visit: Payer: Self-pay | Admitting: Family Medicine

## 2022-04-13 MED ORDER — OXYCODONE-ACETAMINOPHEN 7.5-325 MG PO TABS: 1.0000 | ORAL_TABLET | ORAL | 0 refills | Status: DC | PRN

## 2022-04-15 ENCOUNTER — Other Ambulatory Visit: Payer: Self-pay | Admitting: Family Medicine

## 2022-04-15 DIAGNOSIS — F419 Anxiety disorder, unspecified: Secondary | ICD-10-CM

## 2022-04-23 ENCOUNTER — Other Ambulatory Visit: Payer: Self-pay | Admitting: Family Medicine

## 2022-04-24 MED ORDER — OXYCODONE-ACETAMINOPHEN 7.5-325 MG PO TABS: 1.0000 | ORAL_TABLET | ORAL | 0 refills | Status: DC | PRN

## 2022-05-06 ENCOUNTER — Other Ambulatory Visit: Payer: Self-pay | Admitting: Family Medicine

## 2022-05-06 DIAGNOSIS — E1165 Type 2 diabetes mellitus with hyperglycemia: Secondary | ICD-10-CM

## 2022-05-08 MED ORDER — OXYCODONE-ACETAMINOPHEN 7.5-325 MG PO TABS: 1.0000 | ORAL_TABLET | ORAL | 0 refills | Status: DC | PRN

## 2022-05-14 ENCOUNTER — Telehealth: Payer: Self-pay

## 2022-05-14 DIAGNOSIS — E1110 Type 2 diabetes mellitus with ketoacidosis without coma: Secondary | ICD-10-CM

## 2022-05-14 NOTE — Telephone Encounter (Signed)
Copied from [place] #[id number]. Topic: General - Other >> May 14, 2022  9:17 AM Leitha Schuller wrote: Pt states new diabetic medication prescribed is not covered by his insurance  Pt inquiring if provider wants him to start back on previous medication  Please fu w/ pt

## 2022-05-14 NOTE — Telephone Encounter (Signed)
Patient reports insulin medication is good. His question is on the Trulicity. Dr.Fisher put him on the [facility] but Jannett Celestine is not cover so he is not sure if Dr, Caryn Section wants him back on Trulicity or on something else.

## 2022-05-15 NOTE — Telephone Encounter (Signed)
Would go be to Trulicity for the time being. How did he do with the samples of M[place]? If he did well with it then we can try again after the new when insurance formularies changes.   Will send in prescription to start back on Trulicity.Marland KitchenMarland KitchenMarland KitchenMarland Kitchen when was last dose of Trulicity that he has taken... if more the 2 weeks then will need to start back at lower dose.

## 2022-05-16 MED ORDER — TRULICITY 1.5 MG/0.5ML ~~LOC~~ SOAJ: 1.5000 mg | SUBCUTANEOUS | 4 refills | Status: DC

## 2022-05-16 NOTE — Telephone Encounter (Signed)
LMOVM for pt to call

## 2022-05-22 ENCOUNTER — Other Ambulatory Visit: Payer: Self-pay | Admitting: Family Medicine

## 2022-05-22 ENCOUNTER — Encounter: Payer: Self-pay | Admitting: Family Medicine

## 2022-05-22 DIAGNOSIS — F419 Anxiety disorder, unspecified: Secondary | ICD-10-CM

## 2022-05-24 ENCOUNTER — Other Ambulatory Visit: Payer: Self-pay | Admitting: Family Medicine

## 2022-05-24 MED ORDER — ALPRAZOLAM 0.5 MG PO TABS: ORAL_TABLET | ORAL | 3 refills | Status: DC

## 2022-05-24 MED ORDER — OXYCODONE-ACETAMINOPHEN 7.5-325 MG PO TABS: 1.0000 | ORAL_TABLET | ORAL | 0 refills | Status: DC | PRN

## 2022-06-02 ENCOUNTER — Other Ambulatory Visit: Payer: Self-pay | Admitting: Family Medicine

## 2022-06-02 DIAGNOSIS — E1110 Type 2 diabetes mellitus with ketoacidosis without coma: Secondary | ICD-10-CM

## 2022-06-03 ENCOUNTER — Other Ambulatory Visit: Payer: Self-pay | Admitting: Family Medicine

## 2022-06-04 ENCOUNTER — Other Ambulatory Visit: Payer: Self-pay | Admitting: Family Medicine

## 2022-06-04 ENCOUNTER — Encounter: Payer: Self-pay | Admitting: Family Medicine

## 2022-06-04 MED ORDER — OXYCODONE-ACETAMINOPHEN 7.5-325 MG PO TABS: 1.0000 | ORAL_TABLET | ORAL | 0 refills | Status: DC | PRN

## 2022-06-04 NOTE — Telephone Encounter (Signed)
Requested Prescriptions  Pending Prescriptions Disp Refills   BD PEN NEEDLE NANO 2ND GEN 32G X 4 MM MISC [Pharmacy Med Name: BD NANO 2 GEN PEN NDL 32G 4MM] 400 each 0    Sig: USE AS DIRECTED 4 TIMES A DAY     Endocrinology: Diabetes - Testing Supplies Passed - 06/02/2022  2:36 PM      Passed - Valid encounter within last 12 months    Recent Outpatient Visits           3 months ago [place], Donald E, MD   3 months ago Type 2 diabetes mellitus with ketoacidosis without coma, without long-term current use of insulin [facility])   [facility] Birdie Sons, MD   5 months ago Cyclic vomiting syndrome   [facility] Birdie Sons, MD   6 months ago Uncontrolled type 2 diabetes mellitus with hyperglycemia [facility])   [facility] Birdie Sons, MD   7 months ago MDD (major depressive disorder), recurrent episode, mild [facility])   [facility] Caryn Section, Kirstie Peri, MD

## 2022-06-04 NOTE — Telephone Encounter (Signed)
Requested medication (s) are due for refill today -provider review   Requested medication (s) are on the active medication list -yes  Future visit scheduled -yes  Last refill: 05/24/22 #60  Notes to clinic: Lat request denied- needs appointment- patient has scheduled - requesting extension- non delegated Rx  Requested Prescriptions  Pending Prescriptions Disp Refills   oxyCODONE-acetaminophen (PERCOCET) 7.5-325 MG tablet 60 tablet 0    Sig: Take 1 tablet by mouth every 4 (four) hours as needed. PATIENT NEEDS TO SCHEDULE OFFICE VISIT FOR FOLLOW UP     Not Delegated - Analgesics:  Opioid Agonist Combinations Failed - 06/04/2022  1:33 PM      Failed - This refill cannot be delegated      Failed - Urine Drug Screen completed in last 360 days      Failed - Valid encounter within last 3 months    Recent Outpatient Visits           3 months ago [place], Donald E, MD   3 months ago Type 2 diabetes mellitus with ketoacidosis without coma, without long-term current use of insulin ([place])   [facility] Birdie Sons, MD   5 months ago Cyclic vomiting syndrome   [facility] Birdie Sons, MD   6 months ago Uncontrolled type 2 diabetes mellitus with hyperglycemia [facility])   [facility] Birdie Sons, MD   7 months ago MDD (major depressive disorder), recurrent episode, mild [facility])   [facility] Birdie Sons, MD       Future Appointments             In 2 weeks Fisher, Kirstie Peri, MD [facility], [facility]            Refused Prescriptions Disp Refills   oxyCODONE-acetaminophen (PERCOCET) 7.5-325 MG tablet 60 tablet 0    Sig: Take 1 tablet by mouth every 4 (four) hours as needed. PATIENT NEEDS TO SCHEDULE OFFICE VISIT FOR FOLLOW UP     Not Delegated - Analgesics:  Opioid Agonist Combinations Failed - 06/04/2022  1:33 PM      Failed - This refill cannot be delegated       Failed - Urine Drug Screen completed in last 360 days      Failed - Valid encounter within last 3 months    Recent Outpatient Visits           3 months ago [place], Donald E, MD   3 months ago Type 2 diabetes mellitus with ketoacidosis without coma, without long-term current use of insulin ([place])   [facility] Birdie Sons, MD   5 months ago Cyclic vomiting syndrome   [facility] Birdie Sons, MD   6 months ago Uncontrolled type 2 diabetes mellitus with hyperglycemia [facility])   [facility] Birdie Sons, MD   7 months ago MDD (major depressive disorder), recurrent episode, mild [facility])   [place], Kirstie Peri, MD       Future Appointments             In 2 weeks Fisher, Kirstie Peri, [facility]               Requested Prescriptions  Pending Prescriptions Disp Refills   oxyCODONE-acetaminophen (PERCOCET) 7.5-325 MG tablet 60 tablet 0    Sig: Take 1 tablet by mouth every 4 (  four) hours as needed. PATIENT NEEDS TO SCHEDULE OFFICE VISIT FOR FOLLOW UP     Not Delegated - Analgesics:  Opioid Agonist Combinations Failed - 06/04/2022  1:33 PM      Failed - This refill cannot be delegated      Failed - Urine Drug Screen completed in last 360 days      Failed - Valid encounter within last 3 months    Recent Outpatient Visits           3 months ago [place], Donald E, MD   3 months ago Type 2 diabetes mellitus with ketoacidosis without coma, without long-term current use of insulin ([place])   [facility] Birdie Sons, MD   5 months ago Cyclic vomiting syndrome   [facility] Birdie Sons, MD   6 months ago Uncontrolled type 2 diabetes mellitus with hyperglycemia [facility]   [facility] Birdie Sons, MD   7 months ago MDD (major depressive disorder), recurrent  episode, mild [facility])   [facility] Birdie Sons, MD       Future Appointments             In 2 weeks Fisher, Kirstie Peri, MD [facility], [facility]            Refused Prescriptions Disp Refills   oxyCODONE-acetaminophen (PERCOCET) 7.5-325 MG tablet 60 tablet 0    Sig: Take 1 tablet by mouth every 4 (four) hours as needed. PATIENT NEEDS TO SCHEDULE OFFICE VISIT FOR FOLLOW UP     Not Delegated - Analgesics:  Opioid Agonist Combinations Failed - 06/04/2022  1:33 PM      Failed - This refill cannot be delegated      Failed - Urine Drug Screen completed in last 360 days      Failed - Valid encounter within last 3 months    Recent Outpatient Visits           3 months ago [place], Donald E, MD   3 months ago Type 2 diabetes mellitus with ketoacidosis without coma, without long-term current use of insulin [facility])   [facility] Birdie Sons, MD   5 months ago Cyclic vomiting syndrome   [facility] Birdie Sons, MD   6 months ago Uncontrolled type 2 diabetes mellitus with hyperglycemia [facility])   [facility] Birdie Sons, MD   7 months ago MDD (major depressive disorder), recurrent episode, mild [facility])   [place], Kirstie Peri, MD       Future Appointments             In 2 weeks Fisher, Kirstie Peri, MD [facility], [facility]

## 2022-06-04 NOTE — Addendum Note (Signed)
Addended by: Valli Glance F on: 06/04/2022 01:33 PM   Modules accepted: Orders

## 2022-06-04 NOTE — Telephone Encounter (Signed)
Pt is completely out of his current supply, has scheduled the next available and is requesting refills for the interim of time until his appt.

## 2022-06-06 ENCOUNTER — Other Ambulatory Visit: Payer: Self-pay | Admitting: Family Medicine

## 2022-06-06 NOTE — Telephone Encounter (Signed)
Copied from [place] [id number]. Topic: General - Other >> Jun 06, 2022  2:14 PM Everette C wrote: Reason for CRM: Medication Refill - Medication: oxyCODONE-acetaminophen (PERCOCET) 7.5-325 MG tablet [[id number]]  Has the patient contacted their pharmacy? Yes.   (Agent: If no, request that the patient contact the pharmacy for the refill. If patient does not wish to contact the pharmacy document the reason why and proceed with request.) (Agent: If yes, when and what did the pharmacy advise?)  Preferred Pharmacy (with phone number or street name): [place], [address] [facility] AT [address] [place] [address] Phone: [phone] Fax: [phone] Hours: Not open 24 hours   Has the patient been seen for an appointment in the last year OR does the patient have an upcoming appointment? Yes.    Agent: Please be advised that RX refills may take up to 3 business days. We ask that you follow-up with your pharmacy.

## 2022-06-07 MED ORDER — OXYCODONE-ACETAMINOPHEN 7.5-325 MG PO TABS: 1.0000 | ORAL_TABLET | ORAL | 0 refills | Status: DC | PRN

## 2022-06-07 NOTE — Telephone Encounter (Signed)
Requested medication (s) are due for refill today: yes  Requested medication (s) are on the active medication list: yes  Last refill:  06/04/20--sent to a different pharmacy  Future visit scheduled: yes   Notes to clinic:  [facility] [place] [facility] Request for refill to be [facility] on [address].     Requested Prescriptions  Pending Prescriptions Disp Refills   oxyCODONE-acetaminophen (PERCOCET) 7.5-325 MG tablet 60 tablet 0    Sig: Take 1 tablet by mouth every 4 (four) hours as needed. PATIENT NEEDS OFFICE VISIT FOR FOLLOW UP     Not Delegated - Analgesics:  Opioid Agonist Combinations Failed - 06/06/2022  2:48 PM      Failed - This refill cannot be delegated      Failed - Urine Drug Screen completed in last 360 days      Failed - Valid encounter within last 3 months    Recent Outpatient Visits           3 months ago [place], Donald E, MD   4 months ago Type 2 diabetes mellitus with ketoacidosis without coma, without long-term current use of insulin [facility])   [facility] Birdie Sons, MD   5 months ago Cyclic vomiting syndrome   [facility] Birdie Sons, MD   7 months ago Uncontrolled type 2 diabetes mellitus with hyperglycemia [facility])   [facility] Birdie Sons, MD   7 months ago MDD (major depressive disorder), recurrent episode, mild [facility]   [place], Kirstie Peri, MD       Future Appointments             In 2 weeks Fisher, Kirstie Peri, MD [facility] [place]

## 2022-06-11 ENCOUNTER — Encounter: Payer: Self-pay | Admitting: Family Medicine

## 2022-06-13 NOTE — Telephone Encounter (Signed)
Copied from [place] [id number]. Topic: General - Other >> Jun 13, 2022 12:05 PM Chapman Fitch wrote: Reason for CRM: Pt sent mychart message needing a out of work note for 12.20.23- 12.28.23/ he stated that Dr. Caryn Section usually writes these due to an on going condition he is treated for / please advise asap/ pt plans to return to work tomorrow

## 2022-06-13 NOTE — Telephone Encounter (Signed)
That's fine, he can have a work excuse for those days

## 2022-06-14 ENCOUNTER — Encounter: Payer: Self-pay | Admitting: Family Medicine

## 2022-06-14 NOTE — Telephone Encounter (Signed)
Patient is calling back to check on his work note. Called over to practice per Arbie Cookey she will get the note ready and patient will have it in his mychart.

## 2022-06-18 NOTE — Telephone Encounter (Signed)
Pt's wife called saying they cannot find the letter in his mychart and she cannot come by today.  Marlana Salvage wants to know if it can be emailed to him .  CB#  [phone]

## 2022-06-19 ENCOUNTER — Other Ambulatory Visit: Payer: Self-pay | Admitting: Family Medicine

## 2022-06-21 ENCOUNTER — Other Ambulatory Visit: Payer: Self-pay | Admitting: Family Medicine

## 2022-06-21 ENCOUNTER — Ambulatory Visit: Payer: BC Managed Care – PPO | Admitting: Family Medicine

## 2022-06-21 ENCOUNTER — Encounter: Payer: Self-pay | Admitting: Family Medicine

## 2022-06-21 VITALS — BP 117/72 | HR 96 | Ht 73.0 in | Wt 309.4 lb

## 2022-06-21 DIAGNOSIS — R112 Nausea with vomiting, unspecified: Secondary | ICD-10-CM

## 2022-06-21 DIAGNOSIS — E1165 Type 2 diabetes mellitus with hyperglycemia: Secondary | ICD-10-CM | POA: Diagnosis not present

## 2022-06-21 DIAGNOSIS — R197 Diarrhea, unspecified: Secondary | ICD-10-CM

## 2022-06-21 DIAGNOSIS — Z1159 Encounter for screening for other viral diseases: Secondary | ICD-10-CM | POA: Diagnosis not present

## 2022-06-21 DIAGNOSIS — E782 Mixed hyperlipidemia: Secondary | ICD-10-CM

## 2022-06-21 DIAGNOSIS — R3911 Hesitancy of micturition: Secondary | ICD-10-CM

## 2022-06-21 DIAGNOSIS — E119 Type 2 diabetes mellitus without complications: Secondary | ICD-10-CM | POA: Diagnosis not present

## 2022-06-21 DIAGNOSIS — R350 Frequency of micturition: Secondary | ICD-10-CM

## 2022-06-21 DIAGNOSIS — R1115 Cyclical vomiting syndrome unrelated to migraine: Secondary | ICD-10-CM

## 2022-06-21 DIAGNOSIS — F3341 Major depressive disorder, recurrent, in partial remission: Secondary | ICD-10-CM

## 2022-06-21 LAB — POCT URINALYSIS DIPSTICK
Bilirubin, UA: NEGATIVE
Blood, UA: NEGATIVE
Glucose, UA: POSITIVE — AB
Ketones, UA: NEGATIVE
Leukocytes, UA: NEGATIVE
Nitrite, UA: NEGATIVE
Protein, UA: POSITIVE — AB
Spec Grav, UA: 1.015 (ref 1.010–1.025)
Urobilinogen, UA: 0.2 E.U./dL
pH, UA: 6.5 (ref 5.0–8.0)

## 2022-06-21 MED ORDER — OXYCODONE-ACETAMINOPHEN 7.5-325 MG PO TABS: 1.0000 | ORAL_TABLET | ORAL | 0 refills | Status: DC | PRN

## 2022-06-21 MED ORDER — TIRZEPATIDE 2.5 MG/0.5ML ~~LOC~~ SOAJ: 2.5000 mg | SUBCUTANEOUS | 0 refills | Status: DC

## 2022-06-21 MED ORDER — FINASTERIDE 5 MG PO TABS: 5.0000 mg | ORAL_TABLET | Freq: Every day | ORAL | 3 refills | Status: DC

## 2022-06-21 NOTE — Progress Notes (Signed)
I,Sha'taria Tyson,acting as a Education administrator for [name], MD.,have documented all relevant documentation on the behalf of [name], MD,as directed by  [name], MD while in the presence of [name], MD.   Established patient visit   Patient: Raymond Rose   DOB: Jul 06, 1975   47 y.o. Male  MRN: [id number] Visit Date: 06/21/2022  Today's healthcare provider: [name], MD   Chief Complaint  Patient presents with   Diabetes   Subjective    Urinary Frequency  This is a new problem. The current episode started 1 to 4 weeks ago (2 weeks). The problem occurs every urination. The problem has been gradually worsening. The quality of the pain is described as aching (dull). There has been no fever. He is Sexually active. There is No history of pyelonephritis. Associated symptoms include frequency, nausea, sweats and vomiting. Pertinent negatives include no chills. He has tried nothing for the symptoms.  He did take Flomax in the past for similar symptoms which he some trouble with dizziness.   Diabetes Mellitus Type II, Follow-up  Lab Results  Component Value Date   HGBA1C 10.8 (A) 02/05/2022   HGBA1C 11.2 (A) 11/09/2021   HGBA1C 10.7 (A) 09/03/2021   Wt Readings from Last 3 Encounters:  02/05/22 296 lb (134.3 kg)  11/09/21 (!) 308 lb (139.7 kg)  10/09/21 (!) 317 lb 9.6 oz (144.1 kg)   Last seen for diabetes 4 months ago.   Symptoms: No fatigue No foot ulcerations  No appetite changes Yes nausea  No paresthesia of the feet  Yes polydipsia  Yes polyuria No visual disturbances   Yes vomiting     Home blood sugar records:  250s in fasting and up to 400 at other times.   Episodes of hypoglycemia? No        Current insulin regiment: humalog inject 12-20 units QAC; Basaglar kwikpen inject 54 units daily Most Recent Eye Exam: 6 months ago  Pertinent Labs: Lab Results  Component Value Date   CHOL 140 10/03/2020   HDL 37 (L) 10/03/2020   LDLCALC 62  10/03/2020   TRIG 259 (H) 10/03/2020   CHOLHDL 3.8 10/03/2020   Lab Results  Component Value Date   NA 135 12/04/2020   K 4.7 12/04/2020   CREATININE 0.70 (L) 06/22/2021   EGFR 117 06/22/2021   MICROALBUR 20 03/05/2021   LABMICR 6.7 09/03/2021     ------------------------------------------------------------------------------------------------ Follow up for back and hip pain  Changes made at last visit include no changes.  He reports excellent compliance with treatment. He feels that condition is Unchanged. He is not having side effects.   -----------------------------------------------------------------------------------------   Medications: Outpatient Medications Prior to Visit  Medication Sig   ALPRAZolam (XANAX) 0.5 MG tablet TAKE 1 TABLET BY MOUTH EVERY 4 HOURS AS NEEDE FOR ANXIETY   BD PEN NEEDLE NANO 2ND GEN 32G X 4 MM MISC USE AS DIRECTED 4 TIMES A DAY   blood glucose meter kit and supplies KIT Dispense based on patient and insurance preference. Use up to four times daily as directed. (FOR ICD-9 250.00, 250.01).   Dulaglutide (TRULICITY) 1.5 YQ/0.3KV SOPN Inject 1.5 mg into the skin once a week.   Eszopiclone 3 MG TABS TAKE 1 TABLET BY MOUTH AT BEDTIME TAKE IMMEDIATELY BEFORE BEDTIME   fluconazole (DIFLUCAN) 150 MG tablet TAKE 1 TABLET BY MOUTH AS ONE DOSE   hydrOXYzine (VISTARIL) 50 MG capsule Take 1-2 capsules (50-100 mg total) by mouth daily as needed. (Patient  not taking: Reported on 02/19/2022)   Insulin Glargine (BASAGLAR KWIKPEN) 100 UNIT/ML Inject 54 Units into the skin daily.   insulin lispro (HUMALOG KWIKPEN) 100 UNIT/ML KwikPen Inject up to 20 units three times daily before meals as directed   ketoconazole (NIZORAL) 2 % cream APPLY TO AFFECTED AREA ONCE DAILY FOR 7 DAYS AS NEEDED (Patient taking differently: Apply 1 application  topically daily. APPLY TO AFFECTED AREA ONCE DAILY FOR 7 DAYS AS NEEDED)   lamoTRIgine (LAMICTAL) 100 MG tablet TAKE 1.5 TABLETS ('150MG'$   TOTAL) BY MOUTH DAILY   lithium carbonate 300 MG capsule Take 1-2 capsules (300-600 mg total) by mouth as directed. START TAKING 1 CAP DAILY AM AND 2 CAPS DAILY PM (Patient not taking: Reported on 02/19/2022)   metFORMIN (GLUCOPHAGE-XR) 500 MG 24 hr tablet TAKE 2 TABLETS BY MOUTH TWICE A DAY   omeprazole (PRILOSEC) 40 MG capsule TAKE 1 CAPSULE BY MOUTH EVERY DAY   oxyCODONE-acetaminophen (PERCOCET) 7.5-325 MG tablet Take 1 tablet by mouth every 4 (four) hours as needed. PATIENT NEEDS OFFICE VISIT FOR FOLLOW UP   pioglitazone (ACTOS) 30 MG tablet TAKE 1 TABLET BY MOUTH EVERY DAY   prazosin (MINIPRESS) 2 MG capsule Take 1 capsule (2 mg total) by mouth at bedtime.   prochlorperazine (COMPAZINE) 10 MG tablet TAKE 1/2 - 1 TABLETS BY MOUTH EVERY 6 (SIX) HOURS AS NEEDED FOR NAUSEA OR VOMITING.   rosuvastatin (CRESTOR) 20 MG tablet Take 1 tablet (20 mg total) by mouth daily.   sildenafil (VIAGRA) 50 MG tablet TAKE 1 TO 2 TABLETS BY MOUTH DAILY AS NEEDED   tamsulosin (FLOMAX) 0.4 MG CAPS capsule Take 0.4 mg by mouth daily.   Vilazodone HCl (VIIBRYD) 40 MG TABS Take 1 tablet (40 mg total) by mouth daily.   No facility-administered medications prior to visit.    Review of Systems  Constitutional:  Negative for appetite change, chills and fever.  Respiratory:  Negative for chest tightness, shortness of breath and wheezing.   Cardiovascular:  Negative for chest pain and palpitations.  Gastrointestinal:  Positive for nausea and vomiting. Negative for abdominal pain.  Genitourinary:  Positive for frequency.      Objective    BP 117/72 (BP Location: Left Arm, Patient Position: Sitting, Cuff Size: Large)   Pulse 96   Ht '6\' 1"'$  (1.854 m)   Wt (!) 309 lb 6.4 oz (140.3 kg)   SpO2 100%   BMI 40.82 kg/m   Physical Exam    General: Appearance:    Severely obese male in no acute distress  Eyes:    PERRL, conjunctiva/corneas clear, EOM's intact       Lungs:     Clear to auscultation bilaterally,  respirations unlabored  Heart:    Normal heart rate. Normal rhythm. No murmurs, rubs, or gallops.    MS:   All extremities are intact.    Neurologic:   Awake, alert, oriented x 3. No apparent focal neurological defect.        Results for orders placed or performed in visit on 06/21/22  POCT Urinalysis Dipstick  Result Value Ref Range   Color, UA     Clarity, UA     Glucose, UA Positive (A) Negative   Bilirubin, UA negative    Ketones, UA negative    Spec Grav, UA 1.015 1.010 - 1.025   Blood, UA negative    pH, UA 6.5 5.0 - 8.0   Protein, UA Positive (A) Negative   Urobilinogen, UA 0.2 0.2  or 1.0 E.U./dL   Nitrite, UA negative    Leukocytes, UA Negative Negative   Appearance     Odor      Assessment & Plan     1. Urinary hesitancy Symptoms suspicious for BPH, previously responded to Flomax but did not tolerate. Will try  finasteride (PROSCAR) 5 MG tablet; Take 1 tablet (5 mg total) by mouth daily.  Dispense: 30 tablet; Refill: 3  2. Urinary frequency Likely combination of BPH  and hyperglycemia.   3. Uncontrolled type 2 diabetes mellitus with hyperglycemia ([place]) He continues on Trulicity which initially improved glycemia control a bit, but does not tolerate higher doses due to GI effects. Will try change to [facility]  - Urine Microalbumin w/creat. ratio - HgB A1c - CBC - Comprehensive metabolic panel  4. Recurrent major depressive disorder, in partial remission ([place]) Continues to follow up with psychiatry   5. Obesity, Class III, BMI 40-49.9 (morbid obesity) ([place])  6. Hyperlipidemia, mixed He is tolerating rosuvastatin well with no adverse effects.   - Lipid panel  7. Need for hepatitis C screening test  - Hepatitis C Antibody  8. Cyclic vomiting syndrome Continue follow up GI at [facility]  refill promethazine (PHENERGAN) 25 MG tablet; Take 25 mg by mouth every 6 (six) hours as needed for nausea or vomiting.Contienu        The entirety of the information  documented in the History of Present Illness, Review of Systems and Physical Exam were personally obtained by me. Portions of this information were initially documented by the CMA and reviewed by me for thoroughness and accuracy.     [name], MD  [facility] [phone] (phone) [phone] (fax)  [place]

## 2022-06-21 NOTE — Patient Instructions (Signed)
Please review the attached list of medications and notify my office if there are any errors.   If Raymond Rose is approved, you can take it along with the Trulicity for the first few weeks until your current supply of Trulicity runs out, then stop the Trulicity

## 2022-06-22 LAB — COMPREHENSIVE METABOLIC PANEL
ALT: 16 IU/L (ref 0–44)
AST: 11 IU/L (ref 0–40)
Albumin/Globulin Ratio: 2 (ref 1.2–2.2)
Albumin: 4.4 g/dL (ref 4.1–5.1)
Alkaline Phosphatase: 83 IU/L (ref 44–121)
BUN/Creatinine Ratio: 19 (ref 9–20)
BUN: 14 mg/dL (ref 6–24)
Bilirubin Total: 0.3 mg/dL (ref 0.0–1.2)
CO2: 22 mmol/L (ref 20–29)
Calcium: 9.4 mg/dL (ref 8.7–10.2)
Chloride: 106 mmol/L (ref 96–106)
Creatinine, Ser: 0.72 mg/dL — ABNORMAL LOW (ref 0.76–1.27)
Globulin, Total: 2.2 g/dL (ref 1.5–4.5)
Glucose: 284 mg/dL — ABNORMAL HIGH (ref 70–99)
Potassium: 4.8 mmol/L (ref 3.5–5.2)
Sodium: 145 mmol/L — ABNORMAL HIGH (ref 134–144)
Total Protein: 6.6 g/dL (ref 6.0–8.5)
eGFR: 116 mL/min/{1.73_m2} (ref >59)

## 2022-06-22 LAB — LIPID PANEL
Chol/HDL Ratio: 3.3 ratio (ref 0.0–5.0)
Cholesterol, Total: 168 mg/dL (ref 100–199)
HDL: 51 mg/dL (ref >39)
LDL Chol Calc (NIH): 91 mg/dL (ref 0–99)
Triglycerides: 150 mg/dL — ABNORMAL HIGH (ref 0–149)
VLDL Cholesterol Cal: 26 mg/dL (ref 5–40)

## 2022-06-22 LAB — CBC
Hematocrit: 43.9 % (ref 37.5–51.0)
Hemoglobin: 14.2 g/dL (ref 13.0–17.7)
MCH: 28.6 pg (ref 26.6–33.0)
MCHC: 32.3 g/dL (ref 31.5–35.7)
MCV: 88 fL (ref 79–97)
Platelets: 228 10*3/uL (ref 150–450)
RBC: 4.97 x10E6/uL (ref 4.14–5.80)
RDW: 13.1 % (ref 11.6–15.4)
WBC: 7 10*3/uL (ref 3.4–10.8)

## 2022-06-22 LAB — MICROALBUMIN / CREATININE URINE RATIO
Creatinine, Urine: 84.5 mg/dL
Microalb/Creat Ratio: 7 mg/g creat (ref 0–29)
Microalbumin, Urine: 5.7 ug/mL

## 2022-06-22 LAB — HEMOGLOBIN A1C
Est. average glucose Bld gHb Est-mCnc: 194 mg/dL
Hgb A1c MFr Bld: 8.4 % — ABNORMAL HIGH (ref 4.8–5.6)

## 2022-06-22 LAB — HEPATITIS C ANTIBODY: Hep C Virus Ab: NONREACTIVE

## 2022-07-01 ENCOUNTER — Other Ambulatory Visit: Payer: Self-pay | Admitting: Family Medicine

## 2022-07-01 MED ORDER — OXYCODONE-ACETAMINOPHEN 7.5-325 MG PO TABS: 1.0000 | ORAL_TABLET | ORAL | 0 refills | Status: DC | PRN

## 2022-07-02 ENCOUNTER — Other Ambulatory Visit: Payer: Self-pay | Admitting: Family Medicine

## 2022-07-02 DIAGNOSIS — E1165 Type 2 diabetes mellitus with hyperglycemia: Secondary | ICD-10-CM

## 2022-07-02 MED ORDER — NIRMATRELVIR/RITONAVIR (PAXLOVID)TABLET: 3.0000 | ORAL_TABLET | Freq: Two times a day (BID) | ORAL | 0 refills | Status: AC

## 2022-07-07 ENCOUNTER — Other Ambulatory Visit: Payer: Self-pay | Admitting: Family Medicine

## 2022-07-09 NOTE — Telephone Encounter (Signed)
Requested medications are due for refill today.  unsure  Requested medications are on the active medications list.  yes  Last refill. 03/28/2022 30 mL 4 rf  Future visit scheduled.   no  Notes to clinic.  Pharmacy comment: Alternative Requested:NOT ON INSURANCE FORMULARY.     Requested Prescriptions  Pending Prescriptions Disp Refills   insulin lispro (HUMALOG) 100 UNIT/ML KwikPen [Pharmacy Med Name: INSULIN LISPRO 100 UNIT/ML PEN]  4    Sig: Inject up to 20 units three times daily before meals as directed     Endocrinology:  Diabetes - Insulins Failed - 07/07/2022  4:36 PM      Failed - HBA1C is between 0 and 7.9 and within 180 days    Hgb A1c MFr Bld  Date Value Ref Range Status  06/21/2022 8.4 (H) 4.8 - 5.6 % Final    Comment:             Prediabetes: 5.7 - 6.4          Diabetes: >6.4          Glycemic control for adults with diabetes: <7.0          Passed - Valid encounter within last 6 months    Recent Outpatient Visits           2 weeks ago Urinary hesitancy   [place], Donald E, MD   4 months ago [place], Donald E, MD   5 months ago Type 2 diabetes mellitus with ketoacidosis without coma, without long-term current use of insulin [facility])   [place] Birdie Sons, MD   6 months ago Cyclic vomiting syndrome   [facility] Birdie Sons, MD   8 months ago Uncontrolled type 2 diabetes mellitus with hyperglycemia [facility])   [place] [facility] Caryn Section, Kirstie Peri, MD

## 2022-07-10 ENCOUNTER — Other Ambulatory Visit: Payer: Self-pay | Admitting: Family Medicine

## 2022-07-10 MED ORDER — NOVOLOG FLEXPEN 100 UNIT/ML ~~LOC~~ SOPN: PEN_INJECTOR | SUBCUTANEOUS | 11 refills | Status: DC

## 2022-07-10 NOTE — Addendum Note (Signed)
Addended by: Valli Glance F on: 07/10/2022 09:12 AM   Modules accepted: Orders

## 2022-07-13 ENCOUNTER — Other Ambulatory Visit: Payer: Self-pay | Admitting: Family Medicine

## 2022-07-13 MED ORDER — OXYCODONE-ACETAMINOPHEN 7.5-325 MG PO TABS: 1.0000 | ORAL_TABLET | ORAL | 0 refills | Status: DC | PRN

## 2022-07-14 MED ORDER — TIRZEPATIDE 2.5 MG/0.5ML ~~LOC~~ SOAJ: 2.5000 mg | SUBCUTANEOUS | 0 refills | Status: DC

## 2022-07-14 NOTE — Addendum Note (Signed)
Addended by: Birdie Sons on: 07/14/2022 04:51 PM   Modules accepted: Orders

## 2022-07-20 ENCOUNTER — Telehealth: Payer: Self-pay | Admitting: Family Medicine

## 2022-07-20 DIAGNOSIS — E1165 Type 2 diabetes mellitus with hyperglycemia: Secondary | ICD-10-CM

## 2022-07-20 NOTE — Telephone Encounter (Signed)
Please check with patient to make sure he was able to get [facility] prescription filled. We received fax last week that it was approved.

## 2022-07-22 ENCOUNTER — Other Ambulatory Visit: Payer: Self-pay | Admitting: Family Medicine

## 2022-07-22 NOTE — Telephone Encounter (Signed)
LVM advising rx approved and verifying if patient able to pick up. CRM created. Ok for [facility] to verify if patient picked up rx per Dr. Caryn Section

## 2022-07-23 NOTE — Telephone Encounter (Signed)
Requested medication (s) are due for refill today: review  Requested medication (s) are on the active medication list: yes  Last refill:  04/11/22 #1/3 RF  Future visit scheduled: yes  Notes to clinic:  Unable to refill per protocol, medication not assigned to the refill protocol.    Requested Prescriptions  Pending Prescriptions Disp Refills   fluconazole (DIFLUCAN) 150 MG tablet [Pharmacy Med Name: FLUCONAZOLE 150 MG TABLET] 1 tablet 3    Sig: TAKE 1 TABLET BY MOUTH AS ONE DOSE     Off-Protocol Failed - 07/22/2022 11:04 AM      Failed - Medication not assigned to a protocol, review manually.      Passed - Valid encounter within last 12 months    Recent Outpatient Visits           1 month ago Urinary hesitancy   [place], Donald E, MD   5 months ago [place], Donald E, MD   5 months ago Type 2 diabetes mellitus with ketoacidosis without coma, without long-term current use of insulin [facility])   [place], Donald E, MD   7 months ago Cyclic vomiting syndrome   [facility] Birdie Sons, MD   8 months ago Uncontrolled type 2 diabetes mellitus with hyperglycemia [facility])   [place], Donald E, MD       Future Appointments             In 2 weeks [place], Kirstie Peri, MD [facility], [facility]

## 2022-07-24 ENCOUNTER — Other Ambulatory Visit: Payer: Self-pay | Admitting: Family Medicine

## 2022-07-24 MED ORDER — OXYCODONE-ACETAMINOPHEN 7.5-325 MG PO TABS: 1.0000 | ORAL_TABLET | ORAL | 0 refills | Status: DC | PRN

## 2022-07-25 DIAGNOSIS — M9903 Segmental and somatic dysfunction of lumbar region: Secondary | ICD-10-CM | POA: Diagnosis not present

## 2022-07-25 DIAGNOSIS — M6249 Contracture of muscle, multiple sites: Secondary | ICD-10-CM | POA: Diagnosis not present

## 2022-07-25 DIAGNOSIS — M9902 Segmental and somatic dysfunction of thoracic region: Secondary | ICD-10-CM | POA: Diagnosis not present

## 2022-07-25 DIAGNOSIS — R293 Abnormal posture: Secondary | ICD-10-CM | POA: Diagnosis not present

## 2022-07-25 DIAGNOSIS — M62830 Muscle spasm of back: Secondary | ICD-10-CM | POA: Diagnosis not present

## 2022-07-25 DIAGNOSIS — M9906 Segmental and somatic dysfunction of lower extremity: Secondary | ICD-10-CM | POA: Diagnosis not present

## 2022-07-26 DIAGNOSIS — M9906 Segmental and somatic dysfunction of lower extremity: Secondary | ICD-10-CM | POA: Diagnosis not present

## 2022-07-26 DIAGNOSIS — M9902 Segmental and somatic dysfunction of thoracic region: Secondary | ICD-10-CM | POA: Diagnosis not present

## 2022-07-26 DIAGNOSIS — M62830 Muscle spasm of back: Secondary | ICD-10-CM | POA: Diagnosis not present

## 2022-07-26 DIAGNOSIS — M9903 Segmental and somatic dysfunction of lumbar region: Secondary | ICD-10-CM | POA: Diagnosis not present

## 2022-07-26 DIAGNOSIS — R293 Abnormal posture: Secondary | ICD-10-CM | POA: Diagnosis not present

## 2022-07-26 DIAGNOSIS — M6249 Contracture of muscle, multiple sites: Secondary | ICD-10-CM | POA: Diagnosis not present

## 2022-07-29 DIAGNOSIS — M62830 Muscle spasm of back: Secondary | ICD-10-CM | POA: Diagnosis not present

## 2022-07-29 DIAGNOSIS — M9906 Segmental and somatic dysfunction of lower extremity: Secondary | ICD-10-CM | POA: Diagnosis not present

## 2022-07-29 DIAGNOSIS — M6249 Contracture of muscle, multiple sites: Secondary | ICD-10-CM | POA: Diagnosis not present

## 2022-07-29 DIAGNOSIS — R293 Abnormal posture: Secondary | ICD-10-CM | POA: Diagnosis not present

## 2022-07-29 DIAGNOSIS — M9902 Segmental and somatic dysfunction of thoracic region: Secondary | ICD-10-CM | POA: Diagnosis not present

## 2022-07-29 DIAGNOSIS — M9903 Segmental and somatic dysfunction of lumbar region: Secondary | ICD-10-CM | POA: Diagnosis not present

## 2022-07-31 ENCOUNTER — Other Ambulatory Visit: Payer: Self-pay

## 2022-07-31 DIAGNOSIS — M9902 Segmental and somatic dysfunction of thoracic region: Secondary | ICD-10-CM | POA: Diagnosis not present

## 2022-07-31 DIAGNOSIS — E1165 Type 2 diabetes mellitus with hyperglycemia: Secondary | ICD-10-CM

## 2022-07-31 DIAGNOSIS — R293 Abnormal posture: Secondary | ICD-10-CM | POA: Diagnosis not present

## 2022-07-31 DIAGNOSIS — M6249 Contracture of muscle, multiple sites: Secondary | ICD-10-CM | POA: Diagnosis not present

## 2022-07-31 DIAGNOSIS — M9906 Segmental and somatic dysfunction of lower extremity: Secondary | ICD-10-CM | POA: Diagnosis not present

## 2022-07-31 DIAGNOSIS — M62830 Muscle spasm of back: Secondary | ICD-10-CM | POA: Diagnosis not present

## 2022-07-31 DIAGNOSIS — M9903 Segmental and somatic dysfunction of lumbar region: Secondary | ICD-10-CM | POA: Diagnosis not present

## 2022-07-31 NOTE — Telephone Encounter (Signed)
Patient called in , still waiting on the monjauro to be [facility] [place] since the it was approved. Please call back wit status

## 2022-08-02 ENCOUNTER — Telehealth (INDEPENDENT_AMBULATORY_CARE_PROVIDER_SITE_OTHER): Payer: 59 | Admitting: Family Medicine

## 2022-08-02 DIAGNOSIS — R1115 Cyclical vomiting syndrome unrelated to migraine: Secondary | ICD-10-CM | POA: Diagnosis not present

## 2022-08-02 NOTE — Progress Notes (Signed)
Argentina Ponder De[place],acting as a scribe for [name], MD.,have documented all relevant documentation on the behalf of [name], MD,as directed by  [name], MD while in the presence of [name], MD.   MyChart Video Visit    Virtual Visit via Video Note   This format is felt to be most appropriate for this patient at this time. Physical exam was limited by quality of the video and audio technology used for the visit.   Patient location: home Provider location: office  I discussed the limitations of evaluation and management by telemedicine and the availability of in person appointments. The patient expressed understanding and agreed to proceed.  Patient: Raymond Rose   DOB: 1975/09/17   47 y.o. Male  MRN: [id number] Visit Date: 08/02/2022  Today's healthcare provider: [name], MD   No chief complaint on file.  Subjective    HPI  Patient is a 47 year old male who presents via video visit.  He states he is about to go to court regarding his disability and his attorney would like an updated letter on his status.   Patient states he has had cyclic vomiting, depression and mental health issues for about 20 years.  He reports it became more severe about 4 years ago.  He states he vomits several times a day and states at times he is unable to get the the facilities.  He has worked on and off but states he can not keep a job due to his illness.  He was employed from November 2023 to January 2023.  He states he started a new job on 07/29/2021 but could not go back after an incident where he got sick at work and could not get to the bathroom.    Court date march 1st. Needs letter stating that continue to follow up with me and specialists not improving, not ablet to work for last two years. Had similar around early 2021  Medications: Outpatient Medications Prior to Visit  Medication Sig   ALPRAZolam (XANAX) 0.5 MG tablet TAKE 1 TABLET BY MOUTH EVERY 4 HOURS AS  NEEDE FOR ANXIETY   BD PEN NEEDLE NANO 2ND GEN 32G X 4 MM MISC USE AS DIRECTED 4 TIMES A DAY   blood glucose meter kit and supplies KIT Dispense based on patient and insurance preference. Use up to four times daily as directed. (FOR ICD-9 250.00, 250.01).   Eszopiclone 3 MG TABS TAKE 1 TABLET BY MOUTH AT BEDTIME TAKE IMMEDIATELY BEFORE BEDTIME   finasteride (PROSCAR) 5 MG tablet Take 1 tablet (5 mg total) by mouth daily.   fluconazole (DIFLUCAN) 150 MG tablet TAKE 1 TABLET BY MOUTH AS ONE DOSE   hydrOXYzine (VISTARIL) 50 MG capsule Take 1-2 capsules (50-100 mg total) by mouth daily as needed. (Patient not taking: Reported on 02/19/2022)   insulin aspart (NOVOLOG FLEXPEN) 100 UNIT/ML FlexPen Inject up to 20 units three times daily before meals as directed   Insulin Glargine (BASAGLAR KWIKPEN) 100 UNIT/ML Inject 54 Units into the skin daily.   ketoconazole (NIZORAL) 2 % cream APPLY TO AFFECTED AREA ONCE DAILY FOR 7 DAYS AS NEEDED (Patient taking differently: Apply 1 application  topically daily. APPLY TO AFFECTED AREA ONCE DAILY FOR 7 DAYS AS NEEDED)   lamoTRIgine (LAMICTAL) 100 MG tablet TAKE 1.5 TABLETS ('150MG'$  TOTAL) BY MOUTH DAILY   lithium carbonate 300 MG capsule Take 1-2 capsules (300-600 mg total) by mouth as directed. START TAKING 1 CAP DAILY AM AND 2  CAPS DAILY PM (Patient not taking: Reported on 02/19/2022)   metFORMIN (GLUCOPHAGE-XR) 500 MG 24 hr tablet TAKE 2 TABLETS BY MOUTH TWICE A DAY   omeprazole (PRILOSEC) 40 MG capsule TAKE 1 CAPSULE BY MOUTH EVERY DAY   oxyCODONE-acetaminophen (PERCOCET) 7.5-325 MG tablet Take 1 tablet by mouth every 4 (four) hours as needed.   pioglitazone (ACTOS) 30 MG tablet TAKE 1 TABLET BY MOUTH EVERY DAY   prazosin (MINIPRESS) 2 MG capsule Take 1 capsule (2 mg total) by mouth at bedtime.   prochlorperazine (COMPAZINE) 10 MG tablet TAKE 1/2 - 1 TABLETS BY MOUTH EVERY 6 (SIX) HOURS AS NEEDED FOR NAUSEA OR VOMITING.   promethazine (PHENERGAN) 25 MG tablet Take 25 mg  by mouth every 6 (six) hours as needed for nausea or vomiting.   rosuvastatin (CRESTOR) 20 MG tablet Take 1 tablet (20 mg total) by mouth daily.   sildenafil (VIAGRA) 50 MG tablet TAKE 1 TO 2 TABLETS BY MOUTH DAILY AS NEEDED   tirzepatide (MOUNJARO) 2.5 MG/0.5ML Pen Inject 2.5 mg into the skin once a week.   Vilazodone HCl (VIIBRYD) 40 MG TABS Take 1 tablet (40 mg total) by mouth daily.   No facility-administered medications prior to visit.       Objective    There were no vitals taken for this visit.   Physical Exam  Awake, alert, oriented x 3. In no apparent distress    Assessment & Plan     1. Cyclic vomiting syndrome Updated letter for his legal representation regarding his chronic conditions and inability to maintain employment due to this condition.      I discussed the assessment and treatment plan with the patient. The patient was provided an opportunity to ask questions and all were answered. The patient agreed with the plan and demonstrated an understanding of the instructions.   The patient was advised to call back or seek an in-person evaluation if the symptoms worsen or if the condition fails to improve as anticipated.  I provided 15 minutes of non-face-to-face time during this encounter.  The entirety of the information documented in the History of Present Illness, Review of Systems and Physical Exam were personally obtained by me. Portions of this information were initially documented by the CMA and reviewed by me for thoroughness and accuracy.    [name], MD [place] [phone] (phone) [phone] (fax)  [place]

## 2022-08-03 ENCOUNTER — Other Ambulatory Visit: Payer: Self-pay | Admitting: Family Medicine

## 2022-08-05 ENCOUNTER — Telehealth: Payer: 59 | Admitting: Family Medicine

## 2022-08-05 DIAGNOSIS — M9903 Segmental and somatic dysfunction of lumbar region: Secondary | ICD-10-CM | POA: Diagnosis not present

## 2022-08-05 DIAGNOSIS — M9906 Segmental and somatic dysfunction of lower extremity: Secondary | ICD-10-CM | POA: Diagnosis not present

## 2022-08-05 DIAGNOSIS — R293 Abnormal posture: Secondary | ICD-10-CM | POA: Diagnosis not present

## 2022-08-05 DIAGNOSIS — M6249 Contracture of muscle, multiple sites: Secondary | ICD-10-CM | POA: Diagnosis not present

## 2022-08-05 DIAGNOSIS — M9902 Segmental and somatic dysfunction of thoracic region: Secondary | ICD-10-CM | POA: Diagnosis not present

## 2022-08-05 DIAGNOSIS — M62830 Muscle spasm of back: Secondary | ICD-10-CM | POA: Diagnosis not present

## 2022-08-05 MED ORDER — OXYCODONE-ACETAMINOPHEN 7.5-325 MG PO TABS: 1.0000 | ORAL_TABLET | ORAL | 0 refills | Status: DC | PRN

## 2022-08-06 ENCOUNTER — Encounter: Payer: Self-pay | Admitting: Family Medicine

## 2022-08-07 DIAGNOSIS — M9903 Segmental and somatic dysfunction of lumbar region: Secondary | ICD-10-CM | POA: Diagnosis not present

## 2022-08-07 DIAGNOSIS — M62830 Muscle spasm of back: Secondary | ICD-10-CM | POA: Diagnosis not present

## 2022-08-07 DIAGNOSIS — M6249 Contracture of muscle, multiple sites: Secondary | ICD-10-CM | POA: Diagnosis not present

## 2022-08-07 DIAGNOSIS — M9902 Segmental and somatic dysfunction of thoracic region: Secondary | ICD-10-CM | POA: Diagnosis not present

## 2022-08-07 DIAGNOSIS — M9906 Segmental and somatic dysfunction of lower extremity: Secondary | ICD-10-CM | POA: Diagnosis not present

## 2022-08-07 DIAGNOSIS — R293 Abnormal posture: Secondary | ICD-10-CM | POA: Diagnosis not present

## 2022-08-07 NOTE — Telephone Encounter (Signed)
Letter is done and I left at my workstation. Can leave at the front desk for him to pick up.

## 2022-08-07 NOTE — Telephone Encounter (Signed)
Patient advised.

## 2022-08-09 ENCOUNTER — Ambulatory Visit: Payer: Self-pay | Admitting: Family Medicine

## 2022-08-12 DIAGNOSIS — M9903 Segmental and somatic dysfunction of lumbar region: Secondary | ICD-10-CM | POA: Diagnosis not present

## 2022-08-12 DIAGNOSIS — R293 Abnormal posture: Secondary | ICD-10-CM | POA: Diagnosis not present

## 2022-08-12 DIAGNOSIS — M9906 Segmental and somatic dysfunction of lower extremity: Secondary | ICD-10-CM | POA: Diagnosis not present

## 2022-08-12 DIAGNOSIS — M9902 Segmental and somatic dysfunction of thoracic region: Secondary | ICD-10-CM | POA: Diagnosis not present

## 2022-08-12 DIAGNOSIS — M62830 Muscle spasm of back: Secondary | ICD-10-CM | POA: Diagnosis not present

## 2022-08-12 DIAGNOSIS — M6249 Contracture of muscle, multiple sites: Secondary | ICD-10-CM | POA: Diagnosis not present

## 2022-08-16 ENCOUNTER — Other Ambulatory Visit: Payer: Self-pay | Admitting: Family Medicine

## 2022-08-19 DIAGNOSIS — M9903 Segmental and somatic dysfunction of lumbar region: Secondary | ICD-10-CM | POA: Diagnosis not present

## 2022-08-19 DIAGNOSIS — M62830 Muscle spasm of back: Secondary | ICD-10-CM | POA: Diagnosis not present

## 2022-08-19 DIAGNOSIS — R293 Abnormal posture: Secondary | ICD-10-CM | POA: Diagnosis not present

## 2022-08-19 DIAGNOSIS — M9906 Segmental and somatic dysfunction of lower extremity: Secondary | ICD-10-CM | POA: Diagnosis not present

## 2022-08-19 DIAGNOSIS — M6249 Contracture of muscle, multiple sites: Secondary | ICD-10-CM | POA: Diagnosis not present

## 2022-08-19 DIAGNOSIS — M9902 Segmental and somatic dysfunction of thoracic region: Secondary | ICD-10-CM | POA: Diagnosis not present

## 2022-08-19 MED ORDER — OXYCODONE-ACETAMINOPHEN 7.5-325 MG PO TABS: 1.0000 | ORAL_TABLET | ORAL | 0 refills | Status: DC | PRN

## 2022-08-29 ENCOUNTER — Other Ambulatory Visit: Payer: Self-pay | Admitting: Family Medicine

## 2022-08-30 MED ORDER — OXYCODONE-ACETAMINOPHEN 7.5-325 MG PO TABS: 1.0000 | ORAL_TABLET | ORAL | 0 refills | Status: DC | PRN

## 2022-09-02 DIAGNOSIS — M9906 Segmental and somatic dysfunction of lower extremity: Secondary | ICD-10-CM | POA: Diagnosis not present

## 2022-09-02 DIAGNOSIS — R293 Abnormal posture: Secondary | ICD-10-CM | POA: Diagnosis not present

## 2022-09-02 DIAGNOSIS — M9902 Segmental and somatic dysfunction of thoracic region: Secondary | ICD-10-CM | POA: Diagnosis not present

## 2022-09-02 DIAGNOSIS — M62830 Muscle spasm of back: Secondary | ICD-10-CM | POA: Diagnosis not present

## 2022-09-02 DIAGNOSIS — M9903 Segmental and somatic dysfunction of lumbar region: Secondary | ICD-10-CM | POA: Diagnosis not present

## 2022-09-02 DIAGNOSIS — M6249 Contracture of muscle, multiple sites: Secondary | ICD-10-CM | POA: Diagnosis not present

## 2022-09-05 ENCOUNTER — Other Ambulatory Visit: Payer: Self-pay | Admitting: Family Medicine

## 2022-09-05 DIAGNOSIS — E119 Type 2 diabetes mellitus without complications: Secondary | ICD-10-CM

## 2022-09-09 ENCOUNTER — Other Ambulatory Visit: Payer: Self-pay | Admitting: Family Medicine

## 2022-09-09 DIAGNOSIS — M6249 Contracture of muscle, multiple sites: Secondary | ICD-10-CM | POA: Diagnosis not present

## 2022-09-09 DIAGNOSIS — M9902 Segmental and somatic dysfunction of thoracic region: Secondary | ICD-10-CM | POA: Diagnosis not present

## 2022-09-09 DIAGNOSIS — M62830 Muscle spasm of back: Secondary | ICD-10-CM | POA: Diagnosis not present

## 2022-09-09 DIAGNOSIS — M9906 Segmental and somatic dysfunction of lower extremity: Secondary | ICD-10-CM | POA: Diagnosis not present

## 2022-09-09 DIAGNOSIS — R293 Abnormal posture: Secondary | ICD-10-CM | POA: Diagnosis not present

## 2022-09-09 DIAGNOSIS — M9903 Segmental and somatic dysfunction of lumbar region: Secondary | ICD-10-CM | POA: Diagnosis not present

## 2022-09-11 ENCOUNTER — Other Ambulatory Visit: Payer: Self-pay | Admitting: Family Medicine

## 2022-09-12 DIAGNOSIS — F515 Nightmare disorder: Secondary | ICD-10-CM | POA: Diagnosis not present

## 2022-09-12 DIAGNOSIS — G4700 Insomnia, unspecified: Secondary | ICD-10-CM | POA: Diagnosis not present

## 2022-09-12 DIAGNOSIS — F411 Generalized anxiety disorder: Secondary | ICD-10-CM | POA: Diagnosis not present

## 2022-09-12 DIAGNOSIS — F341 Dysthymic disorder: Secondary | ICD-10-CM | POA: Diagnosis not present

## 2022-09-12 MED ORDER — OXYCODONE-ACETAMINOPHEN 7.5-325 MG PO TABS: 1.0000 | ORAL_TABLET | ORAL | 0 refills | Status: DC | PRN

## 2022-09-21 ENCOUNTER — Other Ambulatory Visit: Payer: Self-pay | Admitting: Family Medicine

## 2022-09-23 MED ORDER — OXYCODONE-ACETAMINOPHEN 7.5-325 MG PO TABS: 1.0000 | ORAL_TABLET | ORAL | 0 refills | Status: DC | PRN

## 2022-09-30 ENCOUNTER — Telehealth: Payer: Self-pay

## 2022-09-30 ENCOUNTER — Other Ambulatory Visit: Payer: Self-pay

## 2022-09-30 DIAGNOSIS — E1165 Type 2 diabetes mellitus with hyperglycemia: Secondary | ICD-10-CM

## 2022-09-30 NOTE — Telephone Encounter (Signed)
Patient will need new Rx sent to pharmacy.  He never started the meds after we got the PA approved.  He now has a new insurance and will the process started over.

## 2022-09-30 NOTE — Telephone Encounter (Signed)
Copied from CRM [id number]. Topic: General - Other >> Sep 30, 2022  1:28 PM Ja-Kwan M wrote: Reason for CRM: Pt called for an update on the prior authorization for tirzepatide [facility]) 2.5 MG/0.5ML Pen.  Pt requests call back. Cb#[phone]

## 2022-10-01 MED ORDER — TIRZEPATIDE 2.5 MG/0.5ML ~~LOC~~ SOAJ: 2.5000 mg | SUBCUTANEOUS | 0 refills | Status: DC

## 2022-10-02 ENCOUNTER — Other Ambulatory Visit: Payer: Self-pay | Admitting: Family Medicine

## 2022-10-03 MED ORDER — OXYCODONE-ACETAMINOPHEN 7.5-325 MG PO TABS: 1.0000 | ORAL_TABLET | ORAL | 0 refills | Status: DC | PRN

## 2022-10-08 ENCOUNTER — Other Ambulatory Visit: Payer: Self-pay | Admitting: Family Medicine

## 2022-10-08 DIAGNOSIS — E782 Mixed hyperlipidemia: Secondary | ICD-10-CM

## 2022-10-11 ENCOUNTER — Ambulatory Visit: Payer: 59 | Admitting: Family Medicine

## 2022-10-11 VITALS — BP 106/71 | HR 90 | Temp 97.7°F | Ht 73.0 in | Wt 310.0 lb

## 2022-10-11 DIAGNOSIS — Z794 Long term (current) use of insulin: Secondary | ICD-10-CM

## 2022-10-11 DIAGNOSIS — F609 Personality disorder, unspecified: Secondary | ICD-10-CM

## 2022-10-11 DIAGNOSIS — R1115 Cyclical vomiting syndrome unrelated to migraine: Secondary | ICD-10-CM | POA: Diagnosis not present

## 2022-10-11 DIAGNOSIS — E1165 Type 2 diabetes mellitus with hyperglycemia: Secondary | ICD-10-CM | POA: Diagnosis not present

## 2022-10-11 DIAGNOSIS — G894 Chronic pain syndrome: Secondary | ICD-10-CM | POA: Diagnosis not present

## 2022-10-11 DIAGNOSIS — F331 Major depressive disorder, recurrent, moderate: Secondary | ICD-10-CM | POA: Diagnosis not present

## 2022-10-11 DIAGNOSIS — Z8249 Family history of ischemic heart disease and other diseases of the circulatory system: Secondary | ICD-10-CM

## 2022-10-11 DIAGNOSIS — M545 Low back pain, unspecified: Secondary | ICD-10-CM

## 2022-10-11 DIAGNOSIS — G8929 Other chronic pain: Secondary | ICD-10-CM

## 2022-10-11 LAB — POCT GLYCOSYLATED HEMOGLOBIN (HGB A1C)
Est. average glucose Bld gHb Est-mCnc: 194
Hemoglobin A1C: 8.4 % — AB (ref 4.0–5.6)

## 2022-10-11 MED ORDER — TIRZEPATIDE 2.5 MG/0.5ML ~~LOC~~ SOAJ: 2.5000 mg | SUBCUTANEOUS | 0 refills | Status: DC

## 2022-10-11 MED ORDER — CEPHALEXIN 500 MG PO CAPS: 500.0000 mg | ORAL_CAPSULE | Freq: Four times a day (QID) | ORAL | 0 refills | Status: AC

## 2022-10-11 NOTE — Progress Notes (Signed)
Established patient visit   Patient: Raymond Rose   DOB: [birth year]   47 y.o. Male  MRN: [id number] Visit Date: 10/11/2022  Today's healthcare provider: Mila Merry, MD    Subjective    HPI Here today to follow up diabetes, chronic back pain, depression, and cyclic vomiting syndrome. Also reports that he has sore on right 3rd finger for a few months that forms a painful boil which he pops, but lesion never heals and reforms boil.   He is now off of Trulicity, as well as some of his psychiatric medications and reports his GI have not improved at all. He was prescribed Mounjaro  in January which was approved, but he change insurance to [place] around that time and is still waiting for approval. He continues on 54 units Basaglar daily.  Wt Readings from Last 3 Encounters:  10/11/22 (!) 310 lb (140.6 kg)  06/21/22 (!) 309 lb 6.4 oz (140.3 kg)  02/05/22 296 lb (134.3 kg)     Medications: Outpatient Medications Prior to Visit  Medication Sig   ALPRAZolam (XANAX) 0.5 MG tablet TAKE 1 TABLET BY MOUTH EVERY 4 HOURS AS NEEDE FOR ANXIETY   BD PEN NEEDLE NANO 2ND GEN 32G X 4 MM MISC USE AS DIRECTED 4 TIMES A DAY   blood glucose meter kit and supplies KIT Dispense based on patient and insurance preference. Use up to four times daily as directed. (FOR ICD-9 250.00, 250.01).   finasteride (PROSCAR) 5 MG tablet Take 1 tablet (5 mg total) by mouth daily.   fluconazole (DIFLUCAN) 150 MG tablet TAKE 1 TABLET BY MOUTH AS ONE DOSE   insulin aspart (NOVOLOG FLEXPEN) 100 UNIT/ML FlexPen Inject up to 20 units three times daily before meals as directed   Insulin Glargine (BASAGLAR KWIKPEN) 100 UNIT/ML Inject 54 Units into the skin daily.   ketoconazole (NIZORAL) 2 % cream APPLY TO AFFECTED AREA ONCE DAILY FOR 7 DAYS AS NEEDED (Patient taking differently: Apply 1 application  topically daily. APPLY TO AFFECTED AREA ONCE DAILY FOR 7 DAYS AS NEEDED)   lamoTRIgine (LAMICTAL) 100 MG tablet TAKE 1.5  TABLETS (150MG  TOTAL) BY MOUTH DAILY   metFORMIN (GLUCOPHAGE-XR) 500 MG 24 hr tablet TAKE 2 TABLETS BY MOUTH TWICE A DAY   omeprazole (PRILOSEC) 40 MG capsule TAKE 1 CAPSULE BY MOUTH EVERY DAY   oxyCODONE-acetaminophen (PERCOCET) 7.5-325 MG tablet Take 1 tablet by mouth every 4 (four) hours as needed.   pioglitazone (ACTOS) 30 MG tablet TAKE 1 TABLET BY MOUTH EVERY DAY   prazosin (MINIPRESS) 2 MG capsule Take 1 capsule (2 mg total) by mouth at bedtime.   prochlorperazine (COMPAZINE) 10 MG tablet TAKE 1/2 - 1 TABLETS BY MOUTH EVERY 6 (SIX) HOURS AS NEEDED FOR NAUSEA OR VOMITING.   promethazine (PHENERGAN) 25 MG tablet Take 25 mg by mouth every 6 (six) hours as needed for nausea or vomiting.   rosuvastatin (CRESTOR) 20 MG tablet TAKE 1 TABLET BY MOUTH EVERY DAY   sildenafil (VIAGRA) 50 MG tablet TAKE 1 TO 2 TABLETS BY MOUTH DAILY AS NEEDED   Vilazodone HCl (VIIBRYD) 40 MG TABS Take 1 tablet (40 mg total) by mouth daily.   Eszopiclone 3 MG TABS TAKE 1 TABLET BY MOUTH AT BEDTIME TAKE IMMEDIATELY BEFORE BEDTIME (Patient not taking: Reported on 10/11/2022)   hydrOXYzine (VISTARIL) 50 MG capsule Take 1-2 capsules (50-100 mg total) by mouth daily as needed. (Patient not taking: Reported on 10/11/2022)   lithium carbonate 300 MG  capsule Take 1-2 capsules (300-600 mg total) by mouth as directed. START TAKING 1 CAP DAILY AM AND 2 CAPS DAILY PM (Patient not taking: Reported on 10/11/2022)   tirzepatide [facility]) 2.5 MG/0.5ML Pen Inject 2.5 mg into the skin once a week. (Patient not taking: Reported on 10/11/2022)   No facility-administered medications prior to visit.    Review of Systems  Constitutional:  Negative for appetite change, chills and fever.  Respiratory:  Negative for chest tightness, shortness of breath and wheezing.   Cardiovascular:  Negative for chest pain and palpitations.  Gastrointestinal:  Negative for abdominal pain, nausea and vomiting.       Objective    BP 106/71 (BP Location:  Right Arm, Patient Position: Sitting, Cuff Size: Normal)   Pulse 90   Temp 97.7 F (36.5 C)   Ht 6\' 1"  (1.854 m)   Wt (!) 310 lb (140.6 kg)   SpO2 97%   BMI 40.90 kg/m    Physical Exam  General appearance: Severely obese male, cooperative and in no acute distress Head: Normocephalic, without obvious abnormality, atraumatic Respiratory: Respirations even and unlabored, normal respiratory rate Extremities: All extremities are intact. Paronychia of right 3rd finger.  Skin: Skin color, texture, turgor normal. No rashes seen  Psych: Appropriate mood and affect. Neurologic: Mental status: Alert, oriented to person, place, and time, thought content appropriate.   Results for orders placed or performed in visit on 10/11/22  POCT HgB A1C  Result Value Ref Range   Hemoglobin A1C 8.4 (A) 4.0 - 5.6 %   Est. average glucose Bld gHb Est-mCnc 194     Assessment & Plan     1. Uncontrolled type 2 diabetes mellitus with hyperglycemia (HCC) Continue insuline, previously on Trulicity which did not control sugars. Chronic GI symptoms have not changed since stopping Trulicity. [place] was initially approved, but never fill as his insurance changed before prescription was dispensed, and PA has not yet gone through [employer].   - tirzepatide [facility]) 2.5 MG/0.5ML Pen; Inject 2.5 mg into the skin once a week.  Dispense: 2 mL; Refill: 0  2. Cyclic vomiting syndrome Continue follow up GI  3. Chronic pain syndrome   4. Chronic low back pain, unspecified back pain laterality, unspecified whether sciatica present Continue current medication regiment.   5. Personality disorder, unspecified (HCC)   6. MDD (major depressive disorder), recurrent episode, moderate (HCC) Continue psychiatry follow up  7. Family history of premature coronary artery disease Multiple cardiac risk factors including several first degree relatives that developed premature cardiac disease.  - Ambulatory referral to  Cardiology  8. Paronychia - cephALEXin (KEFLEX) 500 MG capsule; Take 1 capsule (500 mg total) by mouth 4 (four) times daily for 14 days.  Dispense: 56 capsule; Refill: 0         Mila Merry, MD  [facility] [phone] (phone) [phone] (fax)  [facility]

## 2022-10-11 NOTE — Patient Instructions (Signed)
.   Please review the attached list of medications and notify my office if there are any errors.   . Please bring all of your medications to every appointment so we can make sure that our medication list is the same as yours.   

## 2022-10-14 ENCOUNTER — Other Ambulatory Visit: Payer: Self-pay | Admitting: Family Medicine

## 2022-10-15 ENCOUNTER — Other Ambulatory Visit: Payer: Self-pay | Admitting: Family Medicine

## 2022-10-17 MED ORDER — OXYCODONE-ACETAMINOPHEN 7.5-325 MG PO TABS: 1.0000 | ORAL_TABLET | ORAL | 0 refills | Status: DC | PRN

## 2022-10-24 ENCOUNTER — Other Ambulatory Visit: Payer: Self-pay | Admitting: Family Medicine

## 2022-10-25 ENCOUNTER — Other Ambulatory Visit: Payer: Self-pay | Admitting: Family Medicine

## 2022-10-25 ENCOUNTER — Telehealth: Payer: Self-pay | Admitting: Family Medicine

## 2022-10-25 NOTE — Telephone Encounter (Signed)
Patient is calling to ask why his   - oxyCODONE-acetaminophen (PERCOCET) 7.5-325 MG tablet was denied?  Please advise CB- [phone]

## 2022-10-26 ENCOUNTER — Other Ambulatory Visit: Payer: Self-pay | Admitting: Family Medicine

## 2022-10-27 MED ORDER — OXYCODONE-ACETAMINOPHEN 7.5-325 MG PO TABS: 1.0000 | ORAL_TABLET | ORAL | 0 refills | Status: DC | PRN

## 2022-11-05 ENCOUNTER — Other Ambulatory Visit: Payer: Self-pay | Admitting: Family Medicine

## 2022-11-06 MED ORDER — OXYCODONE-ACETAMINOPHEN 7.5-325 MG PO TABS: 1.0000 | ORAL_TABLET | ORAL | 0 refills | Status: DC | PRN

## 2022-11-11 ENCOUNTER — Other Ambulatory Visit: Payer: Self-pay | Admitting: Family Medicine

## 2022-11-12 ENCOUNTER — Other Ambulatory Visit: Payer: Self-pay | Admitting: Family Medicine

## 2022-11-13 ENCOUNTER — Telehealth: Payer: Self-pay | Admitting: Family Medicine

## 2022-11-13 NOTE — Telephone Encounter (Signed)
Requested medication (s) are due for refill today: see below  Requested medication (s) are on the active medication list: yes  Last refill:  12/27/21 #90/4  Future visit scheduled: yes  Notes to clinic:  Pharmacy comment: Alternative Requested:PRIOR AUYTH.      Requested Prescriptions  Pending Prescriptions Disp Refills   omeprazole (PRILOSEC) 40 MG capsule [Pharmacy Med Name: OMEPRAZOLE DR 40 MG CAPSULE] 90 capsule 4    Sig: TAKE 1 CAPSULE BY MOUTH EVERY DAY     Gastroenterology: Proton Pump Inhibitors Passed - 11/12/2022 12:57 PM      Passed - Valid encounter within last 12 months    Recent Outpatient Visits           1 month ago Uncontrolled type 2 diabetes mellitus with hyperglycemia (HCC)   [place] [facility] Malva Limes, MD   3 months ago Cyclic vomiting syndrome   [facility] Malva Limes, MD   4 months ago Urinary hesitancy   [place] [facility] Malva Limes, MD   8 months ago Scabies   [place] [facility] Malva Limes, MD   9 months ago Type 2 diabetes mellitus with ketoacidosis without coma, without long-term current use of insulin (HCC)   [place] [facility] Malva Limes, MD       Future Appointments             In 2 months Gollan, Tollie Pizza, MD [place] [facility] [place]   In 2 months Fisher, Demetrios Isaacs, MD [facility]

## 2022-11-13 NOTE — Telephone Encounter (Signed)
PA STARTED.  

## 2022-11-13 NOTE — Telephone Encounter (Signed)
Pt called for an update on the prior authorization for tirzepatide [facility]) 2.5 MG/0.5ML Pen.  Pt requests call back. Cb#[phone]

## 2022-11-13 NOTE — Telephone Encounter (Signed)
PA has been started

## 2022-11-13 NOTE — Telephone Encounter (Signed)
PA started

## 2022-11-15 NOTE — Telephone Encounter (Signed)
PA for Greggory Keen was denied Faxed documentation to appeal since patient has tried some of the formularies Trulicity, Victoza 30 mg Currently on Actos 30 mg, Metformin 500 mg 2 tabs twice a day, Basaglar 100 unit/ml, novolog 100 unit/ml.

## 2022-11-17 ENCOUNTER — Other Ambulatory Visit: Payer: Self-pay | Admitting: Family Medicine

## 2022-11-18 DIAGNOSIS — F331 Major depressive disorder, recurrent, moderate: Secondary | ICD-10-CM | POA: Diagnosis not present

## 2022-11-19 MED ORDER — OXYCODONE-ACETAMINOPHEN 7.5-325 MG PO TABS: 1.0000 | ORAL_TABLET | ORAL | 0 refills | Status: DC | PRN

## 2022-11-20 ENCOUNTER — Other Ambulatory Visit: Payer: Self-pay | Admitting: Family Medicine

## 2022-11-20 DIAGNOSIS — R112 Nausea with vomiting, unspecified: Secondary | ICD-10-CM

## 2022-11-20 DIAGNOSIS — R350 Frequency of micturition: Secondary | ICD-10-CM

## 2022-11-20 DIAGNOSIS — R3911 Hesitancy of micturition: Secondary | ICD-10-CM

## 2022-11-21 ENCOUNTER — Other Ambulatory Visit: Payer: Self-pay | Admitting: Family Medicine

## 2022-11-21 DIAGNOSIS — F419 Anxiety disorder, unspecified: Secondary | ICD-10-CM

## 2022-11-21 NOTE — Telephone Encounter (Signed)
Requested medication (s) are due for refill today: yes  Requested medication (s) are on the active medication list: yes  Last refill:  05/24/22 #30/3  Future visit scheduled: yes  Notes to clinic:  Unable to refill per protocol, cannot delegate.    Requested Prescriptions  Pending Prescriptions Disp Refills   ALPRAZolam (XANAX) 0.5 MG tablet [Pharmacy Med Name: ALPRAZOLAM 0.5 MG TABLET] 30 tablet     Sig: TAKE 1 TABLET BY MOUTH EVERY 4 HOURS AS NEEDED FOR ANXIETY     Not Delegated - Psychiatry: Anxiolytics/Hypnotics 2 Failed - 11/21/2022  1:15 PM      Failed - This refill cannot be delegated      Failed - Urine Drug Screen completed in last 360 days      Passed - Patient is not pregnant      Passed - Valid encounter within last 6 months    Recent Outpatient Visits           1 month ago Uncontrolled type 2 diabetes mellitus with hyperglycemia (HCC)   [place] [facility] Malva Limes, MD   3 months ago Cyclic vomiting syndrome   [place] [facility] Malva Limes, MD   5 months ago Urinary hesitancy   [facility] Malva Limes, MD   9 months ago Scabies   [place] [facility] Malva Limes, MD   9 months ago Type 2 diabetes mellitus with ketoacidosis without coma, without long-term current use of insulin (HCC)   [place] [facility] Malva Limes, MD       Future Appointments             In 2 months Gollan, Tollie Pizza, MD [place] [facility] [place]   In 2 months Fisher, Demetrios Isaacs, MD [facility], [facility]

## 2022-11-22 NOTE — Telephone Encounter (Signed)
Dena from [place] reports that patient needs to sign a form to request appeal. Patient advised. Will fax forms and notes to appeals once patient signs form. For [employer].

## 2022-11-26 NOTE — Telephone Encounter (Signed)
Patient has signed Community education officer request. Forms and notes to [employer]. Fax # [phone].

## 2022-11-28 ENCOUNTER — Other Ambulatory Visit: Payer: Self-pay | Admitting: Family Medicine

## 2022-12-01 MED ORDER — OXYCODONE-ACETAMINOPHEN 7.5-325 MG PO TABS: 1.0000 | ORAL_TABLET | ORAL | 0 refills | Status: DC | PRN

## 2022-12-11 DIAGNOSIS — F341 Dysthymic disorder: Secondary | ICD-10-CM | POA: Diagnosis not present

## 2022-12-11 DIAGNOSIS — F515 Nightmare disorder: Secondary | ICD-10-CM | POA: Diagnosis not present

## 2022-12-11 DIAGNOSIS — G4700 Insomnia, unspecified: Secondary | ICD-10-CM | POA: Diagnosis not present

## 2022-12-11 DIAGNOSIS — F411 Generalized anxiety disorder: Secondary | ICD-10-CM | POA: Diagnosis not present

## 2022-12-13 ENCOUNTER — Other Ambulatory Visit: Payer: Self-pay | Admitting: Family Medicine

## 2022-12-13 NOTE — Telephone Encounter (Signed)
Requested medication (s) are due for refill today - provider review   Requested medication (s) are on the active medication list -yes  Future visit scheduled -yes  Last refill: 07/24/22 #1 3RF  Notes to clinic: off protocol- provider review   Requested Prescriptions  Pending Prescriptions Disp Refills   fluconazole (DIFLUCAN) 150 MG tablet [Pharmacy Med Name: FLUCONAZOLE 150 MG TABLET] 1 tablet 3    Sig: TAKE 1 TABLET BY MOUTH AS ONE DOSE     Off-Protocol Failed - 12/13/2022 12:40 PM      Failed - Medication not assigned to a protocol, review manually.      Passed - Valid encounter within last 12 months    Recent Outpatient Visits           2 months ago Uncontrolled type 2 diabetes mellitus with hyperglycemia (HCC)   [place] [facility] Malva Limes, MD   4 months ago Cyclic vomiting syndrome   [facility] Malva Limes, MD   5 months ago Urinary hesitancy   [place] [facility] Malva Limes, MD   9 months ago Scabies   [place] [facility] Malva Limes, MD   10 months ago Type 2 diabetes mellitus with ketoacidosis without coma, without long-term current use of insulin (HCC)   [place] [facility] Malva Limes, MD       Future Appointments             In 1 month Gollan, Tollie Pizza, MD [place] [facility] [place]   In 1 month Sherrie Mustache, Demetrios Isaacs, MD [facility], [facility]               Requested Prescriptions  Pending Prescriptions Disp Refills   fluconazole (DIFLUCAN) 150 MG tablet [Pharmacy Med Name: FLUCONAZOLE 150 MG TABLET] 1 tablet 3    Sig: TAKE 1 TABLET BY MOUTH AS ONE DOSE     Off-Protocol Failed - 12/13/2022 12:40 PM      Failed - Medication not assigned to a protocol, review manually.      Passed - Valid encounter within last 12 months    Recent Outpatient Visits           2 months ago Uncontrolled type 2  diabetes mellitus with hyperglycemia (HCC)   [place] [facility] Malva Limes, MD   4 months ago Cyclic vomiting syndrome   [facility] Malva Limes, MD   5 months ago Urinary hesitancy   [place] [facility] Malva Limes, MD   9 months ago Scabies   [place] [facility] Malva Limes, MD   10 months ago Type 2 diabetes mellitus with ketoacidosis without coma, without long-term current use of insulin (HCC)   [place] [facility] Malva Limes, MD       Future Appointments             In 1 month Gollan, Tollie Pizza, MD [place] [facility] [place]   In 1 month Fisher, Demetrios Isaacs, MD [facility], [facility]

## 2022-12-14 ENCOUNTER — Other Ambulatory Visit: Payer: Self-pay | Admitting: Family Medicine

## 2022-12-14 DIAGNOSIS — E1110 Type 2 diabetes mellitus with ketoacidosis without coma: Secondary | ICD-10-CM

## 2022-12-16 ENCOUNTER — Other Ambulatory Visit: Payer: Self-pay | Admitting: Family Medicine

## 2022-12-17 MED ORDER — OXYCODONE-ACETAMINOPHEN 7.5-325 MG PO TABS: 1.0000 | ORAL_TABLET | ORAL | 0 refills | Status: DC | PRN

## 2022-12-22 ENCOUNTER — Other Ambulatory Visit: Payer: Self-pay | Admitting: Family Medicine

## 2022-12-22 DIAGNOSIS — E1165 Type 2 diabetes mellitus with hyperglycemia: Secondary | ICD-10-CM

## 2022-12-23 NOTE — Telephone Encounter (Signed)
Requested Prescriptions  Pending Prescriptions Disp Refills   pioglitazone (ACTOS) 30 MG tablet [Pharmacy Med Name: PIOGLITAZONE HCL 30 MG TABLET] 30 tablet 5    Sig: TAKE 1 TABLET BY MOUTH EVERY DAY     Endocrinology:  Diabetes - Glitazones - pioglitazone Failed - 12/22/2022  8:46 AM      Failed - HBA1C is between 0 and 7.9 and within 180 days    Hemoglobin A1C  Date Value Ref Range Status  10/11/2022 8.4 (A) 4.0 - 5.6 % Final   Hgb A1c MFr Bld  Date Value Ref Range Status  06/21/2022 8.4 (H) 4.8 - 5.6 % Final    Comment:             Prediabetes: 5.7 - 6.4          Diabetes: >6.4          Glycemic control for adults with diabetes: <7.0          Passed - Valid encounter within last 6 months    Recent Outpatient Visits           2 months ago Uncontrolled type 2 diabetes mellitus with hyperglycemia (HCC)   [place] [facility] Malva Limes, MD   4 months ago Cyclic vomiting syndrome   [facility] Malva Limes, MD   6 months ago Urinary hesitancy   [place] [facility] Malva Limes, MD   10 months ago Scabies   [place] [facility] Malva Limes, MD   10 months ago Type 2 diabetes mellitus with ketoacidosis without coma, without long-term current use of insulin (HCC)   [place] [facility] Sherrie Mustache, Demetrios Isaacs, MD       Future Appointments             In 1 month Gollan, Tollie Pizza, MD  [facility] [place]   In 1 month Fisher, Demetrios Isaacs, MD [facility], [facility]

## 2022-12-25 ENCOUNTER — Other Ambulatory Visit: Payer: Self-pay | Admitting: Family Medicine

## 2022-12-26 ENCOUNTER — Other Ambulatory Visit: Payer: Self-pay | Admitting: Family Medicine

## 2022-12-28 MED ORDER — OXYCODONE-ACETAMINOPHEN 7.5-325 MG PO TABS: 1.0000 | ORAL_TABLET | ORAL | 0 refills | Status: DC | PRN

## 2023-01-03 ENCOUNTER — Encounter: Payer: Self-pay | Admitting: Family Medicine

## 2023-01-03 ENCOUNTER — Other Ambulatory Visit: Payer: Self-pay | Admitting: Family Medicine

## 2023-01-03 DIAGNOSIS — E1165 Type 2 diabetes mellitus with hyperglycemia: Secondary | ICD-10-CM

## 2023-01-03 MED ORDER — TIRZEPATIDE 2.5 MG/0.5ML ~~LOC~~ SOAJ
2.5000 mg | SUBCUTANEOUS | 0 refills | Status: DC
Start: 2023-01-03 — End: 2023-06-19

## 2023-01-09 ENCOUNTER — Other Ambulatory Visit: Payer: Self-pay | Admitting: Family Medicine

## 2023-01-13 MED ORDER — OXYCODONE-ACETAMINOPHEN 7.5-325 MG PO TABS: 1.0000 | ORAL_TABLET | ORAL | 0 refills | Status: DC | PRN

## 2023-01-20 ENCOUNTER — Other Ambulatory Visit: Payer: Self-pay | Admitting: Family Medicine

## 2023-01-22 ENCOUNTER — Other Ambulatory Visit: Payer: Self-pay | Admitting: Family Medicine

## 2023-01-24 MED ORDER — OXYCODONE-ACETAMINOPHEN 7.5-325 MG PO TABS: 1.0000 | ORAL_TABLET | ORAL | 0 refills | Status: DC | PRN

## 2023-01-26 NOTE — Progress Notes (Unsigned)
Cardiology Office Note  Date:  01/27/2023   ID:  Raymond Rose Pulse, DOB Mar 15, 1976, MRN [id number]  PCP:  Malva Limes, MD   Chief Complaint  Patient presents with   New Patient (Initial Visit)    Referred for evaluation of family history of premature coronary artery disease.  No personal cardiac history.      HPI:  [name] is a 47 year old male with past medical history of Diabetes Morbid obesity Nausea vomiting on disability Depression Former smoker, stopped at age 28 Who presents by referral from Dr. Sherrie Mustache for consultation of his family history of cardiac disease  On discussion today, he reports strong family history of cardiac disease Both father and his brother with premature coronary disease in their early 20s Both were longtime smokers  Denies significant chest pain concerning for angina, no significant shortness of breath on exertion No regular exercise program  On disability for cyclical nausea vomiting Through this disorder has lost 100 pounds  Weight down 440, now down to 304  Lab work reviewed 8.4, trending downward Total cholesterol 168, LDL 91  Script for Mounjaro by Dr. Sherrie Mustache, has not started yet  EKG personally reviewed by myself on todays visit EKG Interpretation Date/Time:  Monday January 27 2023 15:54:59 EDT Ventricular Rate:  70 PR Interval:  154 QRS Duration:  78 QT Interval:  378 QTC Calculation: 408 R Axis:   17  Text Interpretation: Normal sinus rhythm Normal ECG When compared with ECG of 06-Feb-2020 11:33, Premature ventricular complexes are no longer Present Confirmed by Julien Nordmann [id number]) on 01/27/2023 4:05:15 PM    PMH:   has a past medical history of Anxiety, Arthritis, COVID-19 (06/2019), COVID-19 virus infection (07/03/2019), Cyclical vomiting, Depression, OSA on CPAP, and Vertigo (01/03/2015).  PSH:    Past Surgical History:  Procedure Laterality Date   BONE TUMOR EXCISION  1992   left arm    CHOLECYSTECTOMY  2010   lap Cholecystectomy   ct scan of abdomen  06/18/2012   with Contrast; 7cm right adrenal mass. 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 Use up to four times daily as directed. (FOR ICD-9 250.00, 250.01). 1 each 0   finasteride (PROSCAR) 5 MG tablet TAKE 1 TABLET (5 MG TOTAL) BY MOUTH DAILY. 30 tablet 3   fluconazole (DIFLUCAN) 150 MG tablet TAKE 1 TABLET BY MOUTH AS ONE DOSE 1 tablet 3   insulin aspart (NOVOLOG FLEXPEN) 100 UNIT/ML FlexPen Inject up to 20 units three times daily before meals as directed 15 mL 11   Insulin Glargine (BASAGLAR KWIKPEN) 100 UNIT/ML Inject 54 Units into the skin daily. 30 mL 5   ketoconazole (NIZORAL) 2 % cream APPLY TO AFFECTED AREA ONCE DAILY FOR 7 DAYS AS NEEDED (Patient taking differently: Apply 1 application  topically daily. APPLY TO AFFECTED AREA ONCE DAILY FOR 7 DAYS AS NEEDED) 15 g 3   lamoTRIgine (LAMICTAL) 100 MG tablet TAKE 1.5 TABLETS (150MG  TOTAL) BY MOUTH  DAILY 45 tablet 0   metFORMIN (GLUCOPHAGE-XR) 500 MG 24 hr tablet TAKE 2 TABLETS BY MOUTH TWICE A DAY 120 tablet 12   omeprazole (PRILOSEC) 40 MG capsule TAKE 1 CAPSULE BY MOUTH EVERY DAY 90 capsule 4   oxyCODONE-acetaminophen (PERCOCET) 7.5-325 MG tablet Take 1 tablet by mouth every 4 (four) hours as needed. 60 tablet 0   pioglitazone (ACTOS) 30 MG tablet TAKE 1 TABLET BY MOUTH EVERY DAY 30 tablet 5   prazosin (MINIPRESS) 2 MG capsule Take 1 capsule (2 mg total) by mouth at bedtime. 30 capsule 0   prochlorperazine (COMPAZINE) 10 MG  tablet TAKE 1/2 - 1 TABLETS BY MOUTH EVERY 6 (SIX) HOURS AS NEEDED FOR NAUSEA OR VOMITING. 90 tablet 2   promethazine (PHENERGAN) 25 MG tablet Take 25 mg by mouth every 6 (six) hours as needed for nausea or vomiting.     rosuvastatin (CRESTOR) 20 MG tablet TAKE 1 TABLET BY MOUTH EVERY DAY 90 tablet 4   sildenafil (VIAGRA) 50 MG tablet TAKE 1 TO 2 TABLETS BY MOUTH DAILY AS NEEDED 90 tablet 3   Vilazodone HCl (VIIBRYD) 40 MG TABS Take 1 tablet (40 mg total) by mouth daily. 30 tablet 0   Eszopiclone 3 MG TABS TAKE 1 TABLET BY MOUTH AT BEDTIME TAKE IMMEDIATELY BEFORE BEDTIME (Patient not taking: Reported on 10/11/2022) 30 tablet 1   hydrOXYzine (VISTARIL) 50 MG capsule Take 1-2 capsules (50-100 mg total) by mouth daily as needed. (Patient not taking: Reported on 10/11/2022) 60 capsule 0   lithium carbonate 300 MG capsule Take 1-2 capsules (300-600 mg total) by mouth as directed. START TAKING 1 CAP DAILY AM AND 2 CAPS DAILY PM (Patient not taking: Reported on 10/11/2022) 90 capsule 0   tirzepatide (MOUNJARO) 2.5 MG/0.5ML Pen Inject 2.5 mg into the skin once a week. (Patient not taking: Reported on 01/27/2023) 2 mL 0   No current facility-administered medications for this visit.     Allergies:   Bupropion and Morphine   Social History:  The patient  reports that he quit smoking about 25 years ago. His smoking use included cigarettes. He started smoking about 30 years ago. He has never used smokeless tobacco. He reports current alcohol use. He reports that he does not use drugs.   Family History:   family history includes Alcohol abuse in an other family member; Arthritis in an other family member; Bipolar disorder in an other family member; CAD in an other family member; Depression in his mother; Heart attack in his brother; Heart attack (age of onset: 4) in his father; Hyperlipidemia in an other family member; Hypertension in an other family member; Lung cancer in an other family member; Stroke in an other  family member.    Review of Systems: Review of Systems  Constitutional: Negative.   HENT: Negative.    Respiratory: Negative.    Cardiovascular: Negative.   Gastrointestinal: Negative.   Musculoskeletal: Negative.   Neurological: Negative.   Psychiatric/Behavioral: Negative.    All other systems reviewed and are negative.    PHYSICAL EXAM: VS:  BP 120/82 (BP Location: Left Arm, Patient Position: Sitting, Cuff Size: Large)   Pulse 70   Ht 6\' 1"  (1.854 m)   Wt (!) 304 lb 3.2 oz (138 kg)   SpO2 98%   BMI 40.13 kg/m  , BMI Body mass index is 40.13 kg/m. GEN: Well nourished, well developed, in no acute distress HEENT: normal Neck: no JVD, carotid bruits, or masses Cardiac: RRR; no  murmurs, rubs, or gallops,no edema  Respiratory:  clear to auscultation bilaterally, normal work of breathing GI: soft, nontender, nondistended, + BS MS: no deformity or atrophy Skin: warm and dry, no rash Neuro:  Strength and sensation are intact Psych: euthymic mood, full affect  Recent Labs: 06/21/2022: ALT 16; BUN 14; Creatinine, Ser 0.72; Hemoglobin 14.2; Platelets 228; Potassium 4.8; Sodium 145   Lipid Panel Lab Results  Component Value Date   CHOL 168 06/21/2022   HDL 51 06/21/2022   LDLCALC 91 06/21/2022   TRIG 150 (H) 06/21/2022      Wt Readings from Last 3 Encounters:  01/27/23 (!) 304 lb 3.2 oz (138 kg)  10/11/22 (!) 310 lb (140.6 kg)  06/21/22 (!) 309 lb 6.4 oz (140.3 kg)     ASSESSMENT AND PLAN:  Problem List Items Addressed This Visit       Cardiology Problems   Hyperlipidemia, mixed - Primary     Other   Diabetes mellitus (HCC)   Other Visit Diagnoses     Family history of cardiac disorder       Relevant Orders   EKG 12-Lead (Completed)      Family history premature cardiac disease Discussed cardiac risk factors including smoking, diabetes, hyperlipidemia Stop smoking at age 80, A1c improving previously 13 out of down to 8 range Cholesterol improved on  Crestor 20 Prior CT scan abdomen pelvis done age 40 showing no coronary calcification mid to distal vessels, no significant aortic atherosclerosis extending down to the femoral arteries -Discussed that calcium scoring could be done, information provided, he will call us if he would like this performed Stressed importance of further weight loss, management of A1c  Hyperlipidemia Continue Crestor 20 daily  Diabetes type 2 with no complications A1c slowly improving from 13 down to 8 Starting [facility]   Total encounter time more than 60 minutes  Greater than 50% was spent in counseling and coordination of care with the patient  Patient seen in consultation for Dr. Sherrie Mustache and will be referred back to his office for ongoing care of the issues detailed above  Signed, Dossie Arbour, M.D., Ph.D. [facility] [place], [place] [phone]

## 2023-01-27 ENCOUNTER — Ambulatory Visit: Payer: 59 | Attending: Cardiovascular Disease | Admitting: Cardiovascular Disease

## 2023-01-27 ENCOUNTER — Encounter: Payer: Self-pay | Admitting: Cardiovascular Disease

## 2023-01-27 VITALS — BP 120/82 | HR 70 | Ht 73.0 in | Wt 304.2 lb

## 2023-01-27 DIAGNOSIS — Z8249 Family history of ischemic heart disease and other diseases of the circulatory system: Secondary | ICD-10-CM | POA: Diagnosis not present

## 2023-01-27 DIAGNOSIS — Z794 Long term (current) use of insulin: Secondary | ICD-10-CM

## 2023-01-27 DIAGNOSIS — E782 Mixed hyperlipidemia: Secondary | ICD-10-CM

## 2023-01-27 DIAGNOSIS — E119 Type 2 diabetes mellitus without complications: Secondary | ICD-10-CM

## 2023-01-27 NOTE — Patient Instructions (Addendum)
Medication Instructions:  No changes  If you need a refill on your cardiac medications before your next appointment, please call your pharmacy.   Lab work: No new labs needed  Testing/Procedures: CT coronary calcium score.   - $99 out of pocket cost at the time of your test - Call [phone] to schedule at your convenience.  [facility] [address] , [place] [address]   Coronary CalciumScan A coronary calcium scan is an imaging test used to look for deposits of calcium and other fatty materials (plaques) in the inner lining of the blood vessels of the heart (coronary arteries). These deposits of calcium and plaques can partly clog and narrow the coronary arteries without producing any symptoms or warning signs. This puts a person at risk for a heart attack. This test can detect these deposits before symptoms develop. Tell a health care provider about: Any allergies you have. All medicines you are taking, including vitamins, herbs, eye drops, creams, and over-the-counter medicines. Any problems you or family members have had with anesthetic medicines. Any blood disorders you have. Any surgeries you have had. Any medical conditions you have. Whether you are pregnant or may be pregnant. What are the risks? Generally, this is a safe procedure. However, problems may occur, including: Harm to a pregnant woman and her unborn baby. This test involves the use of radiation. Radiation exposure can be dangerous to a pregnant woman and her unborn baby. If you are pregnant, you generally should not have this procedure done. Slight increase in the risk of cancer. This is because of the radiation involved in the test. What happens before the procedure? No preparation is needed for this procedure. What happens during the procedure? You will undress and remove any jewelry around your neck or chest. You will put on a hospital gown. Sticky  electrodes will be placed on your chest. The electrodes will be connected to an electrocardiogram (ECG) machine to record a tracing of the electrical activity of your heart. A CT scanner will take pictures of your heart. During this time, you will be asked to lie still and hold your breath for 2-3 seconds while a picture of your heart is being taken. The procedure may vary among health care providers and hospitals. What happens after the procedure? You can get dressed. You can return to your normal activities. It is up to you to get the results of your test. Ask your health care provider, or the department that is doing the test, when your results will be ready. Summary A coronary calcium scan is an imaging test used to look for deposits of calcium and other fatty materials (plaques) in the inner lining of the blood vessels of the heart (coronary arteries). Generally, this is a safe procedure. Tell your health care provider if you are pregnant or may be pregnant. No preparation is needed for this procedure. A CT scanner will take pictures of your heart. You can return to your normal activities after the scan is done. This information is not intended to replace advice given to you by your health care provider. Make sure you discuss any questions you have with your health care provider. Document Released: 11/30/2007 Document Revised: 04/22/2016 Document Reviewed: 04/22/2016 Elsevier Interactive Patient Education  2017 Elsevier Inc.  Follow-Up: At [facility], you and your health needs are our priority.  As part of our continuing mission to provide you with exceptional heart care, we have created designated Provider Care   Teams.  These Care Teams include your primary Cardiologist (physician) and Advanced Practice Providers (APPs -  Physician Assistants and Nurse Practitioners) who all work together to provide you with the care you need, when you need it.  You will need a follow up appointment as  needed  Providers on your designated Care Team:   Christopher Berge, NP Ryan Dunn, PA-C Cadence Furth, PA-C  COVID-19 Vaccine Information can be found at: [email or web address] For questions related to vaccine distribution or appointments, please email [email or web address] or call [phone].   

## 2023-01-31 ENCOUNTER — Ambulatory Visit: Payer: 59 | Admitting: Family Medicine

## 2023-02-03 ENCOUNTER — Other Ambulatory Visit: Payer: Self-pay | Admitting: Family Medicine

## 2023-02-04 ENCOUNTER — Other Ambulatory Visit: Payer: Self-pay | Admitting: Family Medicine

## 2023-02-07 MED ORDER — OXYCODONE-ACETAMINOPHEN 7.5-325 MG PO TABS: 1.0000 | ORAL_TABLET | ORAL | 0 refills | Status: DC | PRN

## 2023-02-08 ENCOUNTER — Other Ambulatory Visit: Payer: Self-pay | Admitting: Family Medicine

## 2023-02-08 DIAGNOSIS — F419 Anxiety disorder, unspecified: Secondary | ICD-10-CM

## 2023-02-17 ENCOUNTER — Other Ambulatory Visit: Payer: Self-pay | Admitting: Family Medicine

## 2023-02-17 MED ORDER — OXYCODONE-ACETAMINOPHEN 7.5-325 MG PO TABS: 1.0000 | ORAL_TABLET | ORAL | 0 refills | Status: DC | PRN

## 2023-02-19 ENCOUNTER — Ambulatory Visit: Payer: 59 | Admitting: Family Medicine

## 2023-02-26 ENCOUNTER — Encounter: Payer: Self-pay | Admitting: Family Medicine

## 2023-02-26 ENCOUNTER — Ambulatory Visit (INDEPENDENT_AMBULATORY_CARE_PROVIDER_SITE_OTHER): Payer: No Typology Code available for payment source | Admitting: Family Medicine

## 2023-02-26 VITALS — BP 130/84 | HR 73 | Temp 98.4°F | Ht 73.0 in | Wt 301.0 lb

## 2023-02-26 DIAGNOSIS — M67441 Ganglion, right hand: Secondary | ICD-10-CM

## 2023-02-26 DIAGNOSIS — N529 Male erectile dysfunction, unspecified: Secondary | ICD-10-CM | POA: Diagnosis not present

## 2023-02-26 DIAGNOSIS — G894 Chronic pain syndrome: Secondary | ICD-10-CM

## 2023-02-26 DIAGNOSIS — G8929 Other chronic pain: Secondary | ICD-10-CM | POA: Diagnosis not present

## 2023-02-26 DIAGNOSIS — M545 Low back pain, unspecified: Secondary | ICD-10-CM | POA: Diagnosis not present

## 2023-02-26 DIAGNOSIS — E1165 Type 2 diabetes mellitus with hyperglycemia: Secondary | ICD-10-CM | POA: Diagnosis not present

## 2023-02-26 DIAGNOSIS — F1190 Opioid use, unspecified, uncomplicated: Secondary | ICD-10-CM | POA: Diagnosis not present

## 2023-02-26 DIAGNOSIS — Z794 Long term (current) use of insulin: Secondary | ICD-10-CM

## 2023-02-26 DIAGNOSIS — Z23 Encounter for immunization: Secondary | ICD-10-CM

## 2023-02-26 LAB — POCT GLYCOSYLATED HEMOGLOBIN (HGB A1C): Hemoglobin A1C: 9.6 % — AB (ref 4.0–5.6)

## 2023-02-26 MED ORDER — PREGABALIN 75 MG PO CAPS: 75.0000 mg | ORAL_CAPSULE | Freq: Two times a day (BID) | ORAL | 3 refills | Status: DC

## 2023-02-26 MED ORDER — SILDENAFIL CITRATE 50 MG PO TABS
50.0000 mg | ORAL_TABLET | Freq: Every day | ORAL | 5 refills | Status: AC | PRN
Start: 2023-02-26 — End: ?

## 2023-02-27 ENCOUNTER — Other Ambulatory Visit: Payer: Self-pay | Admitting: Family Medicine

## 2023-02-28 ENCOUNTER — Other Ambulatory Visit: Payer: Self-pay | Admitting: Family Medicine

## 2023-02-28 ENCOUNTER — Ambulatory Visit: Payer: Self-pay | Admitting: *Deleted

## 2023-02-28 DIAGNOSIS — R112 Nausea with vomiting, unspecified: Secondary | ICD-10-CM

## 2023-02-28 DIAGNOSIS — G44019 Episodic cluster headache, not intractable: Secondary | ICD-10-CM

## 2023-02-28 LAB — PAIN MGT SCRN (14 DRUGS), UR
Amphetamine Scrn, Ur: NEGATIVE ng/mL
BARBITURATE SCREEN URINE: NEGATIVE ng/mL
BENZODIAZEPINE SCREEN, URINE: NEGATIVE ng/mL
Buprenorphine, Urine: NEGATIVE ng/mL
CANNABINOIDS UR QL SCN: POSITIVE ng/mL — AB
Cocaine (Metab) Scrn, Ur: NEGATIVE ng/mL
Creatinine(Crt), U: 161.3 mg/dL (ref 20.0–300.0)
Fentanyl, Urine: NEGATIVE pg/mL
Meperidine Screen, Urine: NEGATIVE ng/mL
Methadone Screen, Urine: NEGATIVE ng/mL
OXYCODONE+OXYMORPHONE UR QL SCN: POSITIVE ng/mL — AB
Opiate Scrn, Ur: NEGATIVE ng/mL
Ph of Urine: 5.5 (ref 4.5–8.9)
Phencyclidine Qn, Ur: NEGATIVE ng/mL
Propoxyphene Scrn, Ur: NEGATIVE ng/mL
Tramadol Screen, Urine: NEGATIVE ng/mL

## 2023-02-28 MED ORDER — PREDNISONE 10 MG PO TABS: ORAL_TABLET | ORAL | 1 refills | Status: DC

## 2023-02-28 NOTE — Telephone Encounter (Signed)
Summary: cluster headache   Cluster headache would like a past medication   prednisone        Called patient [phone] to review sx of cluster headaches and medication request. No answer, LVMTCB [phone].

## 2023-02-28 NOTE — Telephone Encounter (Signed)
Chief Complaint: Medication Question  Symptoms: Cluster headache  Disposition: [] ED /[] Urgent Care (no appt availability in office) / [] Appointment(In office/virtual)/ []  [facility]/ [] Home Care/ [] Refused Recommended Disposition /[] [facility]/ [x]  Follow-up with PCP Additional Notes: Patient states he was seen in office on 02/26/23 and he discussed getting a Rx for prednisone to treat cluster headaches along with 2 other medications. Patient states he received 2 out of the 3 Rxs and wanted to know if PCP could sent the prednisone to the [facility] in [place] as well.  Advised patient I would forward message to PCP for additional recommendations.  Reason for Disposition  [1] Caller has NON-URGENT medicine question about med that PCP prescribed AND [2] triager unable to answer question  Answer Assessment - Initial Assessment Questions 1. NAME of MEDICINE: "What medicine(s) are you calling about?"     Prednisone 2. QUESTION: "What is your question?" (e.g., double dose of medicine, side effect)     I was suppose to be prescribed prednisone  3. PRESCRIBER: "Who prescribed the medicine?" Reason: if prescribed by specialist, call should be referred to that group.     Dr. Sherrie Mustache 4. SYMPTOMS: "Do you have any symptoms?" If Yes, ask: "What symptoms are you having?"  "How bad are the symptoms (e.g., mild, moderate, severe)     Cluster headaches  Protocols used: Medication Question Call-A-AH

## 2023-03-02 MED ORDER — OXYCODONE-ACETAMINOPHEN 7.5-325 MG PO TABS: 1.0000 | ORAL_TABLET | ORAL | 0 refills | Status: DC | PRN

## 2023-03-03 ENCOUNTER — Encounter: Payer: Self-pay | Admitting: Family Medicine

## 2023-03-03 NOTE — Telephone Encounter (Signed)
Requested medications are due for refill today.  yes  Requested medications are on the active medications list.  yes  Last refill. 11/21/2022 #90 2 rf  Future visit scheduled.   yes  Notes to clinic.  Refill not delegated.    Requested Prescriptions  Pending Prescriptions Disp Refills   prochlorperazine (COMPAZINE) 10 MG tablet [Pharmacy Med Name: PROCHLORPERAZINE 10 MG TAB] 90 tablet 2    Sig: TAKE 1/2 - 1 TABLETS BY MOUTH EVERY 6 (SIX) HOURS AS NEEDED FOR NAUSEA OR VOMITING.     Not Delegated - Gastroenterology: Antiemetics Failed - 02/28/2023  2:52 PM      Failed - This refill cannot be delegated      Passed - Valid encounter within last 6 months    Recent Outpatient Visits           5 days ago Uncontrolled type 2 diabetes mellitus with hyperglycemia [facility])   [place] [facility] Malva Limes, MD   4 months ago Uncontrolled type 2 diabetes mellitus with hyperglycemia [facility])   [place] [facility] Malva Limes, MD   7 months ago Cyclic vomiting syndrome   [facility] Malva Limes, MD   8 months ago Urinary hesitancy   [place] [facility] Malva Limes, MD   1 year ago Scabies   [facility] Malva Limes, MD       Future Appointments             In 2 months Fisher, Demetrios Isaacs, MD [facility]
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
## 2023-03-10 ENCOUNTER — Other Ambulatory Visit: Payer: Self-pay | Admitting: Family Medicine

## 2023-03-10 DIAGNOSIS — E782 Mixed hyperlipidemia: Secondary | ICD-10-CM

## 2023-03-11 ENCOUNTER — Other Ambulatory Visit: Payer: Self-pay | Admitting: Family Medicine

## 2023-03-11 DIAGNOSIS — E782 Mixed hyperlipidemia: Secondary | ICD-10-CM

## 2023-03-11 NOTE — Telephone Encounter (Signed)
Medication Refill - Medication: Crestor 20 mg ,100 mg  Has the patient contacted their pharmacy? Yes.   (Agent: If no, request that the patient contact the pharmacy for the refill. If patient does not wish to contact the pharmacy document the reason why and proceed with request.) (Agent: If yes, when and what did the pharmacy advise?)  Preferred Pharmacy (with phone number or street name): [facility] Has the patient been seen for an appointment in the last year OR does the patient have an upcoming appointment? Yes.    Agent: Please be advised that RX refills may take up to 3 business days. We ask that you follow-up with your pharmacy.

## 2023-03-11 NOTE — Telephone Encounter (Signed)
Called pharmacy  - rx was refilled Sept 3 and picked up sept 5th. Pt should have enough medication to last until Oct 5th. Insurance will not process until Oct 5th.

## 2023-03-11 NOTE — Telephone Encounter (Signed)
Requested Prescriptions  Refused Prescriptions Disp Refills   rosuvastatin (CRESTOR) 20 MG tablet 90 tablet 4    Sig: Take 1 tablet (20 mg total) by mouth daily.     Cardiovascular:  Antilipid - Statins 2 Failed - 03/11/2023  8:50 AM      Failed - Cr in normal range and within 360 days    Creatinine  Date Value Ref Range Status  09/07/2014 0.68 mg/dL Final    Comment:    4.09-8.11 NOTE: New Reference Range  08/23/14   09/07/2014 0.68 mg/dL Final    Comment:    9.14-7.82 NOTE: New Reference Range  08/23/14    Creatinine, Ser  Date Value Ref Range Status  06/21/2022 0.72 (L) 0.76 - 1.27 mg/dL Final   Creatinine, POC  Date Value Ref Range Status  02/14/2016 n/a mg/dL Final         Failed - Lipid Panel in normal range within the last 12 months    Cholesterol, Total  Date Value Ref Range Status  06/21/2022 168 100 - 199 mg/dL Final   LDL Chol Calc (NIH)  Date Value Ref Range Status  06/21/2022 91 0 - 99 mg/dL Final   HDL  Date Value Ref Range Status  06/21/2022 51 >39 mg/dL Final   Triglycerides  Date Value Ref Range Status  06/21/2022 150 (H) 0 - 149 mg/dL Final         Passed - Patient is not pregnant      Passed - Valid encounter within last 12 months    Recent Outpatient Visits           1 week ago Uncontrolled type 2 diabetes mellitus with hyperglycemia (HCC)   [place] [facility] Malva Limes, MD   5 months ago Uncontrolled type 2 diabetes mellitus with hyperglycemia [facility])   [place] [facility] Malva Limes, MD   7 months ago Cyclic vomiting syndrome   [facility] Malva Limes, MD   8 months ago Urinary hesitancy   [place] [facility] Malva Limes, MD   1 year ago Scabies   [place] [facility] Malva Limes, MD       Future Appointments             In 2 months Fisher, Demetrios Isaacs, MD [facility]

## 2023-03-13 ENCOUNTER — Other Ambulatory Visit: Payer: Self-pay | Admitting: Family Medicine

## 2023-03-13 MED ORDER — OXYCODONE-ACETAMINOPHEN 7.5-325 MG PO TABS: 1.0000 | ORAL_TABLET | ORAL | 0 refills | Status: DC | PRN

## 2023-03-14 DIAGNOSIS — R2231 Localized swelling, mass and lump, right upper limb: Secondary | ICD-10-CM | POA: Diagnosis not present

## 2023-03-18 ENCOUNTER — Other Ambulatory Visit: Payer: Self-pay | Admitting: Family Medicine

## 2023-03-18 DIAGNOSIS — R3911 Hesitancy of micturition: Secondary | ICD-10-CM

## 2023-03-18 DIAGNOSIS — R350 Frequency of micturition: Secondary | ICD-10-CM

## 2023-03-18 NOTE — Telephone Encounter (Signed)
Requested medication (s) are due for refill today: Yes  Requested medication (s) are on the active medication list: Yes  Last refill:  11/21/22 #30, 2RF  Future visit scheduled: Yes  Notes to clinic:  Unable to refill per protocol due to failed labs, no updated results. PSA lab missing     Requested Prescriptions  Pending Prescriptions Disp Refills   finasteride (PROSCAR) 5 MG tablet [Pharmacy Med Name: FINASTERIDE 5 MG TABLET] 30 tablet 3    Sig: TAKE 1 TABLET (5 MG TOTAL) BY MOUTH DAILY.     Urology: 5-alpha Reductase Inhibitors Failed - 03/18/2023  1:43 AM      Failed - PSA in normal range and within 360 days    No results found for: "LABPSA", "PSA", "PSA1", "ULTRAPSA"       Passed - Valid encounter within last 12 months    Recent Outpatient Visits           2 weeks ago Uncontrolled type 2 diabetes mellitus with hyperglycemia [facility])   [place] [facility] Malva Limes, MD   5 months ago Uncontrolled type 2 diabetes mellitus with hyperglycemia [facility])   [place] [facility] Malva Limes, MD   7 months ago Cyclic vomiting syndrome   [place] [facility] Malva Limes, MD   9 months ago Urinary hesitancy   [place] [facility] Malva Limes, MD   1 year ago Scabies   [place] [facility] Malva Limes, MD       Future Appointments             In 2 months Fisher, Demetrios Isaacs, MD [facility]

## 2023-03-19 ENCOUNTER — Other Ambulatory Visit: Payer: Self-pay | Admitting: Family Medicine

## 2023-03-19 DIAGNOSIS — F419 Anxiety disorder, unspecified: Secondary | ICD-10-CM

## 2023-03-22 ENCOUNTER — Other Ambulatory Visit: Payer: Self-pay | Admitting: Family Medicine

## 2023-03-24 DIAGNOSIS — H5213 Myopia, bilateral: Secondary | ICD-10-CM | POA: Diagnosis not present

## 2023-03-24 LAB — HM DIABETES EYE EXAM

## 2023-03-24 MED ORDER — OXYCODONE-ACETAMINOPHEN 7.5-325 MG PO TABS: 1.0000 | ORAL_TABLET | ORAL | 0 refills | Status: DC | PRN

## 2023-04-01 ENCOUNTER — Other Ambulatory Visit: Payer: Self-pay | Admitting: Family Medicine

## 2023-04-02 NOTE — Telephone Encounter (Signed)
Requested Prescriptions  Pending Prescriptions Disp Refills   Insulin Glargine (BASAGLAR KWIKPEN) 100 UNIT/ML [Pharmacy Med Name: BASAGLAR 100 UNIT/ML KWIKPEN] 30 mL 0    Sig: INJECT 54 UNITS INTO THE SKIN DAILY.     Endocrinology:  Diabetes - Insulins Failed - 04/01/2023  4:26 PM      Failed - HBA1C is between 0 and 7.9 and within 180 days    Hemoglobin A1C  Date Value Ref Range Status  02/26/2023 9.6 (A) 4.0 - 5.6 % Final   Hgb A1c MFr Bld  Date Value Ref Range Status  06/21/2022 8.4 (H) 4.8 - 5.6 % Final    Comment:             Prediabetes: 5.7 - 6.4          Diabetes: >6.4          Glycemic control for adults with diabetes: <7.0          Passed - Valid encounter within last 6 months    Recent Outpatient Visits           1 month ago Uncontrolled type 2 diabetes mellitus with hyperglycemia (HCC)   [place] [facility] Malva Limes, MD   5 months ago Uncontrolled type 2 diabetes mellitus with hyperglycemia [facility])   [place] [facility] Malva Limes, MD   8 months ago Cyclic vomiting syndrome   [facility] Malva Limes, MD   9 months ago Urinary hesitancy   [place] [facility] Malva Limes, MD   1 year ago Scabies   [place] [facility] Malva Limes, MD       Future Appointments             In 1 month Fisher, Demetrios Isaacs, MD [facility]

## 2023-04-08 ENCOUNTER — Other Ambulatory Visit: Payer: Self-pay | Admitting: Family Medicine

## 2023-04-09 MED ORDER — OXYCODONE-ACETAMINOPHEN 7.5-325 MG PO TABS: 1.0000 | ORAL_TABLET | ORAL | 0 refills | Status: DC | PRN

## 2023-04-18 ENCOUNTER — Ambulatory Visit (INDEPENDENT_AMBULATORY_CARE_PROVIDER_SITE_OTHER): Payer: No Typology Code available for payment source | Admitting: Family Medicine

## 2023-04-18 ENCOUNTER — Encounter: Payer: Self-pay | Admitting: Family Medicine

## 2023-04-18 ENCOUNTER — Other Ambulatory Visit: Payer: Self-pay | Admitting: Family Medicine

## 2023-04-18 VITALS — BP 122/77 | HR 83 | Ht 73.0 in | Wt 295.4 lb

## 2023-04-18 DIAGNOSIS — E119 Type 2 diabetes mellitus without complications: Secondary | ICD-10-CM

## 2023-04-18 DIAGNOSIS — Z7985 Long-term (current) use of injectable non-insulin antidiabetic drugs: Secondary | ICD-10-CM

## 2023-04-18 DIAGNOSIS — R3911 Hesitancy of micturition: Secondary | ICD-10-CM | POA: Diagnosis not present

## 2023-04-18 MED ORDER — ALFUZOSIN HCL ER 10 MG PO TB24: 10.0000 mg | ORAL_TABLET | Freq: Every day | ORAL | 2 refills | Status: DC

## 2023-04-18 NOTE — Progress Notes (Signed)
Established patient visit   Patient: Raymond Rose   DOB: 11-04-1975   47 y.o. Male  MRN: [id number] Visit Date: 04/18/2023  Today's healthcare provider: Mila Merry, MD   Chief Complaint  Patient presents with   Diabetes   Subjective    Discussed the use of AI scribe software for clinical note transcription with the patient, who gave verbal consent to proceed.  History of Present Illness   The patient, with a history of diabetes and benign prostatic hyperplasia (BPH), presents with worsening urinary symptoms despite finasteride therapy. He reports difficulty initiating a stream and a sensation of incomplete bladder emptying, particularly at night. He denies pain or discomfort during urination.  He was previously seen by Dr. Angela Adam and reports he did prostate exam and no other work up. Was tried on tamsulosin at that time, which was not effective.  has been on finasteride since it was prescribed by me in June with no improvement.   In addition, the patient is on Mounjaro 2.35for diabetes management. He reports persistent hyperglycemia with morning blood sugars in the 240-260 range, but has noticed some weight loss. He has been tolerating the medication well, with no significant side effects beyond his usual gastrointestinal symptoms. He has 4 doses left, but is due for a refill but his copay is going to be much higher with next fill and is wondering if he can get samples.       Medications: Outpatient Medications Prior to Visit  Medication Sig Note   ALPRAZolam (XANAX) 0.5 MG tablet TAKE 1 TABLET BY MOUTH EVERY 4 HOURS AS NEEDED FOR ANXIETY    BD PEN NEEDLE NANO 2ND GEN 32G X 4 MM MISC USE AS DIRECTED 4 TIMES A DAY    blood glucose meter kit and supplies KIT Dispense based on patient and insurance preference. Use up to four times daily as directed. (FOR ICD-9 250.00, 250.01).    finasteride (PROSCAR) 5 MG tablet TAKE 1 TABLET (5 MG TOTAL) BY MOUTH DAILY.    fluconazole  (DIFLUCAN) 150 MG tablet TAKE 1 TABLET BY MOUTH AS ONE DOSE    insulin aspart (NOVOLOG FLEXPEN) 100 UNIT/ML FlexPen Inject up to 20 units three times daily before meals as directed    Insulin Glargine (BASAGLAR KWIKPEN) 100 UNIT/ML INJECT 54 UNITS INTO THE SKIN DAILY.    lamoTRIgine (LAMICTAL) 100 MG tablet TAKE 1.5 TABLETS (150MG  TOTAL) BY MOUTH DAILY    metFORMIN (GLUCOPHAGE-XR) 500 MG 24 hr tablet TAKE 2 TABLETS BY MOUTH TWICE A DAY    omeprazole (PRILOSEC) 40 MG capsule TAKE 1 CAPSULE BY MOUTH EVERY DAY    oxyCODONE-acetaminophen (PERCOCET) 7.5-325 MG tablet Take 1 tablet by mouth every 4 (four) hours as needed.    pioglitazone (ACTOS) 30 MG tablet TAKE 1 TABLET BY MOUTH EVERY DAY    pregabalin (LYRICA) 75 MG capsule Take 1 capsule (75 mg total) by mouth 2 (two) times daily.    prochlorperazine (COMPAZINE) 10 MG tablet TAKE 1/2 - 1 TABLETS BY MOUTH EVERY 6 (SIX) HOURS AS NEEDED FOR NAUSEA OR VOMITING.    promethazine (PHENERGAN) 25 MG tablet Take 25 mg by mouth every 6 (six) hours as needed for nausea or vomiting.    rosuvastatin (CRESTOR) 20 MG tablet TAKE 1 TABLET BY MOUTH EVERY DAY    sildenafil (VIAGRA) 50 MG tablet Take 1 tablet (50 mg total) by mouth daily as needed for erectile dysfunction.    tirzepatide [facility]) 2.5 MG/0.5ML Pen Inject  2.5 mg into the skin once a week.    Vilazodone HCl (VIIBRYD) 40 MG TABS Take 1 tablet (40 mg total) by mouth daily.    No facility-administered medications prior to visit.   Review of Systems  Constitutional:  Negative for appetite change, chills and fever.  Respiratory:  Negative for chest tightness, shortness of breath and wheezing.   Cardiovascular:  Negative for chest pain and palpitations.  Gastrointestinal:  Negative for abdominal pain, nausea and vomiting.       Objective    BP 122/77   Pulse 83   Ht 6\' 1"  (1.854 m)   Wt 295 lb 6.4 oz (134 kg)   SpO2 100%   BMI 38.97 kg/m   Physical Exam  General appearance: Mildly obese  male, cooperative and in no acute distress Head: Normocephalic, without obvious abnormality, atraumatic Respiratory: Respirations even and unlabored, normal respiratory rate Extremities: All extremities are intact.  Skin: Skin color, texture, turgor normal. No rashes seen  Psych: Appropriate mood and affect. Neurologic: Mental status: Alert, oriented to person, place, and time, thought content appropriate.   Assessment & Plan        Type 2 Diabetes Mellitus On Mounjaro 2.5mg . He has chronic nausea, vomiting and diarrhea, which is unchanged since starting Mounjaro. Has not noticed much change in sugars but is losing weight.  -finish last four pens of Mounjaro 2.5mg  daily. -Check A1C and other routine labs. - He will need samples for the last 4-5 weeks of the year. Consider going up to 5mg  if A1c is not better.   Urinary hesitancy Treated earlier this year with tamsulosin prescribed by Dr. Sheppard Penton by patient report. Patient states no work up done besides prostate exam.  Difficulty with urine flow despite being on finasteride for approximately 6 months. No longer on tamsulosin which was ineffective. No pain reported, but increased frequency and incomplete emptying noted. -Continue finasteride. -Add Uroxatral to regimen to improve urine flow. -Referral to urology for further evaluation and management.         Mila Merry, MD  [facility] [phone] (phone) [phone] (fax)  [facility]

## 2023-04-19 LAB — COMPREHENSIVE METABOLIC PANEL
ALT: 17 [IU]/L (ref 0–44)
AST: 14 [IU]/L (ref 0–40)
Albumin: 4.7 g/dL (ref 4.1–5.1)
Alkaline Phosphatase: 103 [IU]/L (ref 44–121)
BUN/Creatinine Ratio: 10 (ref 9–20)
BUN: 9 mg/dL (ref 6–24)
Bilirubin Total: 0.5 mg/dL (ref 0.0–1.2)
CO2: 25 mmol/L (ref 20–29)
Calcium: 10.1 mg/dL (ref 8.7–10.2)
Chloride: 99 mmol/L (ref 96–106)
Creatinine, Ser: 0.86 mg/dL (ref 0.76–1.27)
Globulin, Total: 2.7 g/dL (ref 1.5–4.5)
Glucose: 239 mg/dL — ABNORMAL HIGH (ref 70–99)
Potassium: 4.4 mmol/L (ref 3.5–5.2)
Sodium: 139 mmol/L (ref 134–144)
Total Protein: 7.4 g/dL (ref 6.0–8.5)
eGFR: 109 mL/min/{1.73_m2} (ref >59)

## 2023-04-19 LAB — LIPID PANEL
Chol/HDL Ratio: 4.7 ratio (ref 0.0–5.0)
Cholesterol, Total: 173 mg/dL (ref 100–199)
HDL: 37 mg/dL — ABNORMAL LOW (ref >39)
LDL Chol Calc (NIH): 90 mg/dL (ref 0–99)
Triglycerides: 274 mg/dL — ABNORMAL HIGH (ref 0–149)
VLDL Cholesterol Cal: 46 mg/dL — ABNORMAL HIGH (ref 5–40)

## 2023-04-19 LAB — HEMOGLOBIN A1C
Est. average glucose Bld gHb Est-mCnc: 197 mg/dL
Hgb A1c MFr Bld: 8.5 % — ABNORMAL HIGH (ref 4.8–5.6)

## 2023-04-20 MED ORDER — OXYCODONE-ACETAMINOPHEN 7.5-325 MG PO TABS: 1.0000 | ORAL_TABLET | ORAL | 0 refills | Status: DC | PRN

## 2023-04-23 ENCOUNTER — Telehealth: Payer: Self-pay | Admitting: Family Medicine

## 2023-05-02 ENCOUNTER — Encounter: Payer: Self-pay | Admitting: Family Medicine

## 2023-05-02 ENCOUNTER — Other Ambulatory Visit: Payer: Self-pay | Admitting: Family Medicine

## 2023-05-02 NOTE — Telephone Encounter (Signed)
error 

## 2023-05-04 MED ORDER — OXYCODONE-ACETAMINOPHEN 7.5-325 MG PO TABS: 1.0000 | ORAL_TABLET | ORAL | 0 refills | Status: DC | PRN

## 2023-05-09 ENCOUNTER — Other Ambulatory Visit: Payer: Self-pay | Admitting: Family Medicine

## 2023-05-09 DIAGNOSIS — R112 Nausea with vomiting, unspecified: Secondary | ICD-10-CM

## 2023-05-12 ENCOUNTER — Ambulatory Visit: Payer: No Typology Code available for payment source | Admitting: Urology

## 2023-05-13 ENCOUNTER — Other Ambulatory Visit: Payer: Self-pay | Admitting: Family Medicine

## 2023-05-14 MED ORDER — OXYCODONE-ACETAMINOPHEN 7.5-325 MG PO TABS: 1.0000 | ORAL_TABLET | ORAL | 0 refills | Status: DC | PRN

## 2023-05-26 ENCOUNTER — Other Ambulatory Visit: Payer: Self-pay | Admitting: Family Medicine

## 2023-05-27 MED ORDER — OXYCODONE-ACETAMINOPHEN 7.5-325 MG PO TABS: 1.0000 | ORAL_TABLET | ORAL | 0 refills | Status: DC | PRN

## 2023-05-28 ENCOUNTER — Ambulatory Visit: Payer: No Typology Code available for payment source | Admitting: Family Medicine

## 2023-06-04 ENCOUNTER — Other Ambulatory Visit: Payer: Self-pay | Admitting: Family Medicine

## 2023-06-04 NOTE — Telephone Encounter (Signed)
Requested medication (s) are due for refill today: yes  Requested medication (s) are on the active medication list: yes  Last refill:  04/02/23 #30  Future visit scheduled: no  Notes to clinic:  abnormal lab work-    Requested Prescriptions  Pending Prescriptions Disp Refills   Insulin Glargine (BASAGLAR KWIKPEN) 100 UNIT/ML [Pharmacy Med Name: BASAGLAR 100 UNIT/ML KWIKPEN]  1    Sig: INJECT 54 UNITS INTO THE SKIN DAILY.     Endocrinology:  Diabetes - Insulins Failed - 06/04/2023 10:59 AM      Failed - HBA1C is between 0 and 7.9 and within 180 days    Hgb A1c MFr Bld  Date Value Ref Range Status  04/18/2023 8.5 (H) 4.8 - 5.6 % Final    Comment:             Prediabetes: 5.7 - 6.4          Diabetes: >6.4          Glycemic control for adults with diabetes: <7.0          Passed - Valid encounter within last 6 months    Recent Outpatient Visits           1 month ago Urinary hesitancy   [place] [facility] Malva Limes, MD   3 months ago Uncontrolled type 2 diabetes mellitus with hyperglycemia [facility])   [place] [facility] Malva Limes, MD   7 months ago Uncontrolled type 2 diabetes mellitus with hyperglycemia [facility])   [place] [facility] Malva Limes, MD   10 months ago Cyclic vomiting syndrome   [facility] Malva Limes, MD   11 months ago Urinary hesitancy   [place] Malva Limes, MD

## 2023-06-05 ENCOUNTER — Other Ambulatory Visit: Payer: Self-pay | Admitting: Family Medicine

## 2023-06-06 MED ORDER — OXYCODONE-ACETAMINOPHEN 7.5-325 MG PO TABS: 1.0000 | ORAL_TABLET | ORAL | 0 refills | Status: DC | PRN

## 2023-06-11 ENCOUNTER — Other Ambulatory Visit: Payer: Self-pay | Admitting: Family Medicine

## 2023-06-11 DIAGNOSIS — E1165 Type 2 diabetes mellitus with hyperglycemia: Secondary | ICD-10-CM

## 2023-06-15 ENCOUNTER — Other Ambulatory Visit: Payer: Self-pay | Admitting: Family Medicine

## 2023-06-17 MED ORDER — OXYCODONE-ACETAMINOPHEN 7.5-325 MG PO TABS: 1.0000 | ORAL_TABLET | ORAL | 0 refills | Status: DC | PRN

## 2023-06-19 ENCOUNTER — Other Ambulatory Visit: Payer: Self-pay | Admitting: Family Medicine

## 2023-06-19 DIAGNOSIS — E119 Type 2 diabetes mellitus without complications: Secondary | ICD-10-CM

## 2023-06-19 MED ORDER — TIRZEPATIDE 5 MG/0.5ML ~~LOC~~ SOAJ: 5.0000 mg | SUBCUTANEOUS | 2 refills | Status: DC

## 2023-06-25 DIAGNOSIS — G4700 Insomnia, unspecified: Secondary | ICD-10-CM | POA: Diagnosis not present

## 2023-06-25 DIAGNOSIS — F331 Major depressive disorder, recurrent, moderate: Secondary | ICD-10-CM | POA: Diagnosis not present

## 2023-06-25 DIAGNOSIS — F411 Generalized anxiety disorder: Secondary | ICD-10-CM | POA: Diagnosis not present

## 2023-06-30 ENCOUNTER — Other Ambulatory Visit: Payer: Self-pay | Admitting: Family Medicine

## 2023-07-01 MED ORDER — OXYCODONE-ACETAMINOPHEN 7.5-325 MG PO TABS: 1.0000 | ORAL_TABLET | ORAL | 0 refills | Status: DC | PRN

## 2023-07-02 ENCOUNTER — Ambulatory Visit: Payer: No Typology Code available for payment source | Admitting: Urology

## 2023-07-04 ENCOUNTER — Other Ambulatory Visit: Payer: Self-pay | Admitting: Family Medicine

## 2023-07-04 DIAGNOSIS — E1110 Type 2 diabetes mellitus with ketoacidosis without coma: Secondary | ICD-10-CM

## 2023-07-04 NOTE — Telephone Encounter (Signed)
Requested Prescriptions  Pending Prescriptions Disp Refills   BD PEN NEEDLE NANO 2ND GEN 32G X 4 MM MISC [Pharmacy Med Name: BD NANO 2 GEN PEN NDL 32G 4MM] 100 each 3    Sig: USE AS DIRECTED 4 TIMES A DAY     Endocrinology: Diabetes - Testing Supplies Passed - 07/04/2023  4:43 PM      Passed - Valid encounter within last 12 months    Recent Outpatient Visits           2 months ago Urinary hesitancy   [place] [facility] Malva Limes, MD   4 months ago Uncontrolled type 2 diabetes mellitus with hyperglycemia [facility])   [place] [facility] Malva Limes, MD   8 months ago Uncontrolled type 2 diabetes mellitus with hyperglycemia [facility])   [facility] Malva Limes, MD   11 months ago Cyclic vomiting syndrome   [facility] Malva Limes, MD   1 year ago Urinary hesitancy   [place] [facility] Malva Limes, MD       Future Appointments             In 3 weeks Stoioff, Verna Czech, MD [facility] [place]

## 2023-07-11 ENCOUNTER — Other Ambulatory Visit: Payer: Self-pay | Admitting: Family Medicine

## 2023-07-12 ENCOUNTER — Other Ambulatory Visit: Payer: Self-pay | Admitting: Family Medicine

## 2023-07-12 DIAGNOSIS — R3911 Hesitancy of micturition: Secondary | ICD-10-CM

## 2023-07-12 MED ORDER — OXYCODONE-ACETAMINOPHEN 7.5-325 MG PO TABS: 1.0000 | ORAL_TABLET | ORAL | 0 refills | Status: DC | PRN

## 2023-07-14 NOTE — Telephone Encounter (Signed)
Requested medications are due for refill today.  yes  Requested medications are on the active medications list.  yes  Last refill. 04/18/2023 #30 2rf  Future visit scheduled.   no  Notes to clinic.  Missing labs.     Requested Prescriptions  Pending Prescriptions Disp Refills   alfuzosin (UROXATRAL) 10 MG 24 hr tablet [Pharmacy Med Name: ALFUZOSIN HCL ER 10 MG TABLET] 30 tablet 2    Sig: Take 1 tablet (10 mg total) by mouth daily with breakfast.     Urology: Alpha-Adrenergic Blocker 2 Failed - 07/14/2023  3:10 PM      Failed - PSA in normal range and within 360 days    No results found for: "LABPSA", "PSA", "PSA1", "ULTRAPSA"       Passed - Cr in normal range and within 360 days    Creatinine  Date Value Ref Range Status  09/07/2014 0.68 mg/dL Final    Comment:    1.61-0.96 NOTE: New Reference Range  08/23/14   09/07/2014 0.68 mg/dL Final    Comment:    0.45-4.09 NOTE: New Reference Range  08/23/14    Creatinine, Ser  Date Value Ref Range Status  04/18/2023 0.86 0.76 - 1.27 mg/dL Final   Creatinine, POC  Date Value Ref Range Status  02/14/2016 n/a mg/dL Final         Passed - ALT in normal range and within 360 days    ALT  Date Value Ref Range Status  04/18/2023 17 0 - 44 IU/L Final   SGPT (ALT)  Date Value Ref Range Status  09/07/2014 33 U/L Final    Comment:    17-63 NOTE: New Reference Range  08/23/14          Passed - AST in normal range and within 360 days    AST  Date Value Ref Range Status  04/18/2023 14 0 - 40 IU/L Final   SGOT(AST)  Date Value Ref Range Status  09/07/2014 29 U/L Final    Comment:    15-41 NOTE: New Reference Range  08/23/14          Passed - Last BP in normal range    BP Readings from Last 1 Encounters:  04/18/23 122/77         Passed - Valid encounter within last 12 months    Recent Outpatient Visits           2 months ago Urinary hesitancy   [place] [facility] Malva Limes, MD    4 months ago Uncontrolled type 2 diabetes mellitus with hyperglycemia [facility])   [place] [facility] Malva Limes, MD   9 months ago Uncontrolled type 2 diabetes mellitus with hyperglycemia [facility])   [place] [facility] Malva Limes, MD   11 months ago Cyclic vomiting syndrome   [facility] Malva Limes, MD   1 year ago Urinary hesitancy   [place] [facility] Malva Limes, MD       Future Appointments             In 2 weeks Stoioff, Verna Czech, MD [facility] [place]

## 2023-07-21 ENCOUNTER — Other Ambulatory Visit: Payer: Self-pay | Admitting: Family Medicine

## 2023-07-21 DIAGNOSIS — M19011 Primary osteoarthritis, right shoulder: Secondary | ICD-10-CM | POA: Diagnosis not present

## 2023-07-23 ENCOUNTER — Other Ambulatory Visit: Payer: Self-pay | Admitting: Family Medicine

## 2023-07-25 ENCOUNTER — Other Ambulatory Visit: Payer: Self-pay | Admitting: Family Medicine

## 2023-07-26 MED ORDER — OXYCODONE-ACETAMINOPHEN 7.5-325 MG PO TABS: 1.0000 | ORAL_TABLET | ORAL | 0 refills | Status: DC | PRN

## 2023-07-30 ENCOUNTER — Encounter: Payer: Self-pay | Admitting: Urology

## 2023-07-30 ENCOUNTER — Other Ambulatory Visit: Payer: Self-pay | Admitting: Family Medicine

## 2023-07-30 ENCOUNTER — Ambulatory Visit: Payer: Self-pay | Admitting: Urology

## 2023-08-01 ENCOUNTER — Other Ambulatory Visit: Payer: Self-pay | Admitting: Family Medicine

## 2023-08-04 MED ORDER — OXYCODONE-ACETAMINOPHEN 7.5-325 MG PO TABS: 1.0000 | ORAL_TABLET | ORAL | 0 refills | Status: DC | PRN

## 2023-08-04 NOTE — Telephone Encounter (Signed)
Patient scheduled  a follow up appointment with PCP for 08/06/2023. Patient states he has been out for 4 days and would like a short supply, patient states he was unaware he was due for appointment.

## 2023-08-04 NOTE — Telephone Encounter (Signed)
He is due for office visit for follow up diabetes. Needs to schedule this within next few weeks before we can approve prescription refill.

## 2023-08-06 ENCOUNTER — Telehealth: Payer: Self-pay

## 2023-08-06 ENCOUNTER — Encounter: Payer: Self-pay | Admitting: Family Medicine

## 2023-08-06 ENCOUNTER — Ambulatory Visit: Payer: No Typology Code available for payment source | Admitting: Family Medicine

## 2023-08-06 VITALS — BP 127/80 | HR 71 | Resp 16 | Ht 73.0 in | Wt 296.0 lb

## 2023-08-06 DIAGNOSIS — F3341 Major depressive disorder, recurrent, in partial remission: Secondary | ICD-10-CM

## 2023-08-06 DIAGNOSIS — R1115 Cyclical vomiting syndrome unrelated to migraine: Secondary | ICD-10-CM | POA: Diagnosis not present

## 2023-08-06 DIAGNOSIS — E1169 Type 2 diabetes mellitus with other specified complication: Secondary | ICD-10-CM

## 2023-08-06 DIAGNOSIS — M545 Low back pain, unspecified: Secondary | ICD-10-CM | POA: Diagnosis not present

## 2023-08-06 DIAGNOSIS — G8929 Other chronic pain: Secondary | ICD-10-CM | POA: Diagnosis not present

## 2023-08-06 DIAGNOSIS — Z794 Long term (current) use of insulin: Secondary | ICD-10-CM | POA: Diagnosis not present

## 2023-08-06 DIAGNOSIS — E119 Type 2 diabetes mellitus without complications: Secondary | ICD-10-CM

## 2023-08-06 LAB — POCT GLYCOSYLATED HEMOGLOBIN (HGB A1C)
Est. average glucose Bld gHb Est-mCnc: 220
Hemoglobin A1C: 9.3 % — AB (ref 4.0–5.6)

## 2023-08-06 MED ORDER — OZEMPIC (0.25 OR 0.5 MG/DOSE) 2 MG/3ML ~~LOC~~ SOPN: PEN_INJECTOR | SUBCUTANEOUS | Status: DC

## 2023-08-06 MED ORDER — OXYCODONE-ACETAMINOPHEN 7.5-325 MG PO TABS: 1.0000 | ORAL_TABLET | ORAL | 0 refills | Status: DC | PRN

## 2023-08-06 NOTE — Telephone Encounter (Signed)
 Patient was identified as falling into the [place] Measure - Diabetes.   Patient was: Appointment scheduled with primary care provider in the next 30 days.

## 2023-08-06 NOTE — Patient Instructions (Signed)
 Marland Kitchen  Please review the attached list of medications and notify my office if there are any errors.   . Please bring all of your medications to every appointment so we can make sure that our medication list is the same as yours.

## 2023-08-06 NOTE — Progress Notes (Signed)
Established patient visit   Patient: Raymond Rose   DOB: 1975/10/26   47 y.o. Male  MRN: [id number] Visit Date: 08/06/2023  Today's healthcare provider: Mila Merry, MD   Chief Complaint  Patient presents with   Medical Management of Chronic Issues    T2DM. Anxiety, Depression, pain meds refill   Subjective    HPI  Follow up type 2 diabetes since prescribed Mounjaro in September. He took the starting 2.5mg  dose for 2 months which he tolerated fairly well, but insurance did not cover refill so has not taken for the last. He took Trulicity in the past but stopped due to lack of control if diabetes and GI side effects. He states he checked with his insurance and Greggory Keen is no longer covered, but Ozempic is.   Lab Results  Component Value Date   HGBA1C 9.3 (A) 08/06/2023   HGBA1C 8.5 (H) 04/18/2023   HGBA1C 9.6 (A) 02/26/2023     Medications: Outpatient Medications Prior to Visit  Medication Sig   alfuzosin (UROXATRAL) 10 MG 24 hr tablet TAKE 1 TABLET (10 MG TOTAL) BY MOUTH DAILY WITH BREAKFAST.   ALPRAZolam (XANAX) 0.5 MG tablet TAKE 1 TABLET BY MOUTH EVERY 4 HOURS AS NEEDED FOR ANXIETY   ARIPiprazole (ABILIFY) 2 MG tablet Take 2 mg by mouth daily.   BD PEN NEEDLE NANO 2ND GEN 32G X 4 MM MISC USE AS DIRECTED 4 TIMES A DAY   blood glucose meter kit and supplies KIT Dispense based on patient and insurance preference. Use up to four times daily as directed. (FOR ICD-9 250.00, 250.01).   finasteride (PROSCAR) 5 MG tablet TAKE 1 TABLET (5 MG TOTAL) BY MOUTH DAILY.   fluconazole (DIFLUCAN) 150 MG tablet TAKE 1 TABLET BY MOUTH AS ONE DOSE   insulin aspart (NOVOLOG FLEXPEN) 100 UNIT/ML FlexPen Inject up to 20 units three times daily before meals as directed   Insulin Glargine (BASAGLAR KWIKPEN) 100 UNIT/ML INJECT 54 UNITS INTO THE SKIN DAILY.   lamoTRIgine (LAMICTAL) 100 MG tablet TAKE 1.5 TABLETS (150MG  TOTAL) BY MOUTH DAILY (Patient taking differently: No sig reported)    metFORMIN (GLUCOPHAGE-XR) 500 MG 24 hr tablet TAKE 2 TABLETS BY MOUTH TWICE A DAY   omeprazole (PRILOSEC) 40 MG capsule TAKE 1 CAPSULE BY MOUTH EVERY DAY   pioglitazone (ACTOS) 30 MG tablet TAKE 1 TABLET BY MOUTH EVERY DAY   pregabalin (LYRICA) 75 MG capsule Take 1 capsule (75 mg total) by mouth 2 (two) times daily.   prochlorperazine (COMPAZINE) 10 MG tablet TAKE 1/2 - 1 TABLETS BY MOUTH EVERY 6 (SIX) HOURS AS NEEDED FOR NAUSEA OR VOMITING.   promethazine (PHENERGAN) 25 MG tablet Take 25 mg by mouth every 6 (six) hours as needed for nausea or vomiting.   rosuvastatin (CRESTOR) 20 MG tablet TAKE 1 TABLET BY MOUTH EVERY DAY   sildenafil (VIAGRA) 50 MG tablet Take 1 tablet (50 mg total) by mouth daily as needed for erectile dysfunction.   Vilazodone HCl (VIIBRYD) 40 MG TABS Take 1 tablet (40 mg total) by mouth daily.   [DISCONTINUED] oxyCODONE-acetaminophen (PERCOCET) 7.5-325 MG tablet Take 1 tablet by mouth every 4 (four) hours as needed.   [DISCONTINUED] tirzepatide [facility]) 5 MG/0.5ML Pen Inject 5 mg into the skin once a week.   No facility-administered medications prior to visit.    Review of Systems     Objective    BP 127/80 (BP Location: Left Arm, Patient Position: Sitting, Cuff Size: Large)  Pulse 71   Resp 16   Ht 6\' 1"  (1.854 m)   Wt 296 lb (134.3 kg)   BMI 39.05 kg/m    Physical Exam   General appearance: Mildly obese male, cooperative and in no acute distress Head: Normocephalic, without obvious abnormality, atraumatic Respiratory: Respirations even and unlabored, normal respiratory rate Extremities: All extremities are intact.  Skin: Skin color, texture, turgor normal. No rashes seen  Psych: Appropriate mood and affect. Neurologic: Mental status: Alert, oriented to person, place, and time, thought content appropriate.   Results for orders placed or performed in visit on 08/06/23  POCT glycosylated hemoglobin (Hb A1C)  Result Value Ref Range   Hemoglobin A1C  9.3 (A) 4.0 - 5.6 %   Est. average glucose Bld gHb Est-mCnc 220     Assessment & Plan     1. Type 2 diabetes mellitus without complication, without long-term current use of insulin (HCC) (Primary) Did well with starting dose of Mounjaro but is apparently no longer on insurance formulary  Will try samples Semaglutide,0.25 or 0.5MG /DOS, (OZEMPIC, 0.25 OR 0.5 MG/DOSE,) 2 MG/3ML SOPN; Inject 0.25mg  weekly for 4 weeks, then increase to 0.5 mg weekly if tolerating.  He reports that he cannot afford either medication until he meets his deductible. Will try samples for now and get prescription copay card if deductible not met in a few months.   2. Obesity, Class III, BMI 40-49.9 (morbid obesity) (HCC) Will hopefully by able to titrate GLP-1 up to dose that will help lose weight.   3. Cyclic vomiting syndrome Stable even when on GLP-1. Will titrate up cautiously.   4. MDD (major depressive disorder), recurrent, in partial remission (HCC) Continue current meds and follow up with psychiatry.   5. Chronic low back pain, unspecified back pain laterality, unspecified whether sciatica present Stable on current pain management. Refill - oxyCODONE-acetaminophen (PERCOCET) 7.5-325 MG tablet; Take 1 tablet by mouth every 4 (four) hours as needed.  Dispense: 60 tablet; Refill: 0           Mila Merry, MD  [facility] [phone] (phone) [phone] (fax)  [place] [facility]

## 2023-08-08 ENCOUNTER — Other Ambulatory Visit: Payer: Self-pay | Admitting: Family Medicine

## 2023-08-11 ENCOUNTER — Emergency Department
Admission: EM | Admit: 2023-08-11 | Discharge: 2023-08-11 | Disposition: A | Discharge status: Home / Self Care | Payer: No Typology Code available for payment source | Attending: Emergency Medicine | Admitting: Emergency Medicine

## 2023-08-11 ENCOUNTER — Other Ambulatory Visit: Payer: Self-pay

## 2023-08-11 DIAGNOSIS — R1013 Epigastric pain: Secondary | ICD-10-CM | POA: Insufficient documentation

## 2023-08-11 DIAGNOSIS — R1115 Cyclical vomiting syndrome unrelated to migraine: Secondary | ICD-10-CM | POA: Insufficient documentation

## 2023-08-11 DIAGNOSIS — R112 Nausea with vomiting, unspecified: Secondary | ICD-10-CM | POA: Diagnosis present

## 2023-08-11 LAB — CBC WITH DIFFERENTIAL/PLATELET
Abs Immature Granulocytes: 0.02 10*3/uL (ref 0.00–0.07)
Basophils Absolute: 0 10*3/uL (ref 0.0–0.1)
Basophils Relative: 0 %
Eosinophils Absolute: 0 10*3/uL (ref 0.0–0.5)
Eosinophils Relative: 0 %
HCT: 47.9 % (ref 39.0–52.0)
Hemoglobin: 15.7 g/dL (ref 13.0–17.0)
Immature Granulocytes: 0 %
Lymphocytes Relative: 15 %
Lymphs Abs: 1.3 10*3/uL (ref 0.7–4.0)
MCH: 28.8 pg (ref 26.0–34.0)
MCHC: 32.8 g/dL (ref 30.0–36.0)
MCV: 87.7 fL (ref 80.0–100.0)
Monocytes Absolute: 0.4 10*3/uL (ref 0.1–1.0)
Monocytes Relative: 4 %
Neutro Abs: 6.6 10*3/uL (ref 1.7–7.7)
Neutrophils Relative %: 81 %
Platelets: 235 10*3/uL (ref 150–400)
RBC: 5.46 MIL/uL (ref 4.22–5.81)
RDW: 13.5 % (ref 11.5–15.5)
WBC: 8.3 10*3/uL (ref 4.0–10.5)
nRBC: 0 % (ref 0.0–0.2)

## 2023-08-11 LAB — COMPREHENSIVE METABOLIC PANEL
ALT: 28 U/L (ref 0–44)
AST: 29 U/L (ref 15–41)
Albumin: 4.9 g/dL (ref 3.5–5.0)
Alkaline Phosphatase: 58 U/L (ref 38–126)
Anion gap: 16 — ABNORMAL HIGH (ref 5–15)
BUN: 16 mg/dL (ref 6–20)
CO2: 25 mmol/L (ref 22–32)
Calcium: 10.2 mg/dL (ref 8.9–10.3)
Chloride: 99 mmol/L (ref 98–111)
Creatinine, Ser: 1.03 mg/dL (ref 0.61–1.24)
GFR, Estimated: >60 mL/min (ref >60)
Glucose, Bld: 203 mg/dL — ABNORMAL HIGH (ref 70–99)
Potassium: 4.6 mmol/L (ref 3.5–5.1)
Sodium: 140 mmol/L (ref 135–145)
Total Bilirubin: 1.3 mg/dL — ABNORMAL HIGH (ref 0.0–1.2)
Total Protein: 8.5 g/dL — ABNORMAL HIGH (ref 6.5–8.1)

## 2023-08-11 MED ORDER — ONDANSETRON HCL 4 MG/2ML IJ SOLN: 4.0000 mg | Freq: Once | INTRAMUSCULAR | Status: DC

## 2023-08-11 MED ORDER — SODIUM CHLORIDE 0.9 % IV SOLN
25.0000 mg | Freq: Once | INTRAVENOUS | Status: AC
  Administered 2023-08-11: 25 mg via INTRAVENOUS
  Filled 2023-08-11: qty 1

## 2023-08-11 MED ORDER — SODIUM CHLORIDE 0.9 % IV SOLN: Freq: Once | INTRAVENOUS | Status: AC

## 2023-08-11 NOTE — ED Provider Notes (Signed)
 [facility] Provider Note    Event Date/Time   First MD Initiated Contact with Patient 08/11/23 1206     (approximate)   History   Emesis   HPI  Raymond Rose is a 48 y.o. male with a history of cyclic vomiting syndrome presents with complaints of nausea vomiting epigastric discomfort which is relatively typical of his cyclical vomiting syndrome.  He reports worse with stress and his wife recently had cancer scare.     Physical Exam   Triage Vital Signs: ED Triage Vitals [08/11/23 1150]  Encounter Vitals Group     BP (!) 143/88     Systolic BP Percentile      Diastolic BP Percentile      Pulse Rate (!) 118     Resp 18     Temp 98.2 F (36.8 C)     Temp Source Oral     SpO2 100 %     Weight 129.3 kg (285 lb)     Height 1.854 m (6\' 1" )     Head Circumference      Peak Flow      Pain Score 0     Pain Loc      Pain Education      Exclude from Growth Chart     Most recent vital signs: Vitals:   08/11/23 1150  BP: (!) 143/88  Pulse: (!) 118  Resp: 18  Temp: 98.2 F (36.8 C)  SpO2: 100%     General: Awake, no distress.  CV:  Good peripheral perfusion.  Resp:  Normal effort.  Abd:  No distention. Other:     ED Results / Procedures / Treatments   Labs (all labs ordered are listed, but only abnormal results are displayed) Labs Reviewed  COMPREHENSIVE METABOLIC PANEL - Abnormal; Notable for the following components:      Result Value   Glucose, Bld 203 (*)    Total Protein 8.5 (*)    Total Bilirubin 1.3 (*)    Anion gap 16 (*)    All other components within normal limits  CBC WITH DIFFERENTIAL/PLATELET     EKG     RADIOLOGY     PROCEDURES:  Critical Care performed:   Procedures   MEDICATIONS ORDERED IN ED: Medications  0.9 %  sodium chloride infusion ( Intravenous New Bag/Given 08/11/23 1234)  promethazine (PHENERGAN) 25 mg in sodium chloride 0.9 % 50 mL IVPB (0 mg Intravenous Stopped 08/11/23 1323)      IMPRESSION / MDM / ASSESSMENT AND PLAN / ED COURSE  I reviewed the triage vital signs and the nursing notes. Patient's presentation is most consistent with severe exacerbation of chronic illness.  Patient with a history of cyclical vomiting presents with nausea vomiting epigastric discomfort.  He reports he smokes marijuana to help with this and does not think that is the cause of his syndrome.  Lab work reviewed and is overall reassuring he does feel dehydrated  Will treat with IV fluids, IV Phenergan and reevaluate  Lab work reviewed and is overall reassuring.  Mildly elevated anion gap likely related to dehydration  ----------------------------------------- 1:34 PM on 08/11/2023 ----------------------------------------- Patient feeling improved, no further vomiting after treatment, heart rate has improved to less than 100, proper for discharge at this time, he agrees with this plan.      FINAL CLINICAL IMPRESSION(S) / ED DIAGNOSES   Final diagnoses:  Cyclical vomiting     Rx / DC Orders   ED  Discharge Orders     None        Note:  This document was prepared using Dragon voice recognition software and may include unintentional dictation errors.   Jene Every, MD 08/11/23 [phone]

## 2023-08-11 NOTE — ED Triage Notes (Addendum)
 Pt states emesis since Friday, pt states HX of cyclic vomiting, pt does endorses he smokes marijuana daily. NAD noted. Pt states not able to keep fluid down. VSS.   Pt does endorse after triage was complete that he states he started Ozempic this past Thursday, pt states taking Mejaro for 3 months prior tot hat but switched because of insurance issues.

## 2023-08-12 ENCOUNTER — Telehealth: Payer: Self-pay

## 2023-08-12 NOTE — Transitions of Care (Post Inpatient/ED Visit) (Signed)
 08/12/2023  Name: Raymond Rose MRN: [id number] DOB: 08-09-1975  Today's TOC FU Call Status: Today's TOC FU Call Status:: Successful TOC FU Call Completed TOC FU Call Complete Date: 08/12/23 Patient's Name and Date of Birth confirmed.  Transition Care Management Follow-up Telephone Call Date of Discharge: 08/11/23 Discharge Facility: [facility]) Type of Discharge: Emergency Department Reason for ED Visit: Other: (vomiting) How have you been since you were released from the hospital?: Better Any questions or concerns?: No  Items Reviewed: Did you receive and understand the discharge instructions provided?: Yes Medications obtained,verified, and reconciled?: Yes (Medications Reviewed) Any new allergies since your discharge?: No Dietary orders reviewed?: Yes Do you have support at home?: Yes People in Home: spouse  Medications Reviewed Today: Medications Reviewed Today     Reviewed by Karena Addison, LPN (Licensed Practical Nurse) on 08/12/23 at 1417  Med List Status: <None>   Medication Order Taking? Sig Documenting Provider Last Dose Status Informant  alfuzosin (UROXATRAL) 10 MG 24 hr tablet [id number] No TAKE 1 TABLET (10 MG TOTAL) BY MOUTH DAILY WITH BREAKFAST. Malva Limes, MD Taking Active   ALPRAZolam Prudy Feeler) 0.5 MG tablet [id number] No TAKE 1 TABLET BY MOUTH EVERY 4 HOURS AS NEEDED FOR ANXIETY Malva Limes, MD Taking Active   ARIPiprazole (ABILIFY) 2 MG tablet [id number] No Take 2 mg by mouth daily. [provider] Taking Active   BD PEN NEEDLE NANO 2ND GEN 32G X 4 MM MISC [id number] No USE AS DIRECTED 4 TIMES A DAY Malva Limes, MD Taking Active   blood glucose meter kit and supplies KIT [id number] No Dispense based on patient and insurance preference. Use up to four times daily as directed. (FOR ICD-9 250.00, 250.01). Darlin Drop, DO Taking Active   finasteride (PROSCAR) 5 MG tablet [id number] No TAKE 1 TABLET (5 MG  TOTAL) BY MOUTH DAILY. Malva Limes, MD Taking Active   fluconazole (DIFLUCAN) 150 MG tablet [id number] No TAKE 1 TABLET BY MOUTH AS ONE DOSE Malva Limes, MD Taking Active   insulin aspart (NOVOLOG FLEXPEN) 100 UNIT/ML FlexPen [id number] No Inject up to 20 units three times daily before meals as directed Malva Limes, MD Taking Active   Insulin Glargine [facility] KWIKPEN) 100 UNIT/ML [id number]  INJECT 54 UNITS INTO THE SKIN DAILY. Malva Limes, MD  Active   lamoTRIgine (LAMICTAL) 100 MG tablet [id number] No TAKE 1.5 TABLETS (150MG  TOTAL) BY MOUTH DAILY  Patient taking differently: No sig reported   Jomarie Longs, MD Taking Active Self  metFORMIN (GLUCOPHAGE-XR) 500 MG 24 hr tablet [id number] No TAKE 2 TABLETS BY MOUTH TWICE A DAY Malva Limes, MD Taking Active   omeprazole (PRILOSEC) 40 MG capsule [id number] No TAKE 1 CAPSULE BY MOUTH EVERY DAY Malva Limes, MD Taking Active   oxyCODONE-acetaminophen (PERCOCET) 7.5-325 MG tablet [id number]  Take 1 tablet by mouth every 4 (four) hours as needed. Malva Limes, MD  Active   pioglitazone (ACTOS) 30 MG tablet [id number] No TAKE 1 TABLET BY MOUTH EVERY DAY Malva Limes, MD Taking Active   pregabalin (LYRICA) 75 MG capsule [id number] No Take 1 capsule (75 mg total) by mouth 2 (two) times daily. Malva Limes, MD Taking Active   prochlorperazine (COMPAZINE) 10 MG tablet [id number] No TAKE 1/2 - 1 TABLETS BY MOUTH EVERY 6 (SIX) HOURS AS NEEDED FOR NAUSEA OR VOMITING. Malva Limes, MD Taking Active   promethazine (PHENERGAN) 25 MG tablet [id number] No  Take 25 mg by mouth every 6 (six) hours as needed for nausea or vomiting. [provider] Taking Active   rosuvastatin (CRESTOR) 20 MG tablet [id number] No TAKE 1 TABLET BY MOUTH EVERY DAY Fisher, Demetrios Isaacs, MD Taking Active   Semaglutide,0.25 or 0.5MG /DOS, (OZEMPIC, 0.25 OR 0.5 MG/DOSE,) 2 MG/3ML SOPN [id number]  Inject 0.25mg  weekly for 4 weeks, then increase to 0.5 mg  weekly if tolerating Malva Limes, MD  Active   sildenafil (VIAGRA) 50 MG tablet [id number] No Take 1 tablet (50 mg total) by mouth daily as needed for erectile dysfunction. Malva Limes, MD Taking Active   Vilazodone HCl (VIIBRYD) 40 MG TABS [id number] No Take 1 tablet (40 mg total) by mouth daily. Malva Limes, MD Taking Active             Home Care and Equipment/Supplies: Were Home Health Services Ordered?: NA Any new equipment or medical supplies ordered?: NA  Functional Questionnaire: Do you need assistance with bathing/showering or dressing?: No Do you need assistance with meal preparation?: No Do you need assistance with eating?: No Do you have difficulty maintaining continence: No Do you need assistance with getting out of bed/getting out of a chair/moving?: No Do you have difficulty managing or taking your medications?: No  Follow up appointments reviewed: PCP Follow-up appointment confirmed?: Yes Date of PCP follow-up appointment?: 08/14/23 Follow-up Provider: [facility] Follow-up appointment confirmed?: NA Do you need transportation to your follow-up appointment?: No Do you understand care options if your condition(s) worsen?: Yes-patient verbalized understanding    SIGNATURE Karena Addison, LPN [facility] Nurse Health Advisor Direct Dial [phone]

## 2023-08-13 DIAGNOSIS — Z7984 Long term (current) use of oral hypoglycemic drugs: Secondary | ICD-10-CM | POA: Diagnosis not present

## 2023-08-13 DIAGNOSIS — R10816 Epigastric abdominal tenderness: Secondary | ICD-10-CM | POA: Diagnosis not present

## 2023-08-13 DIAGNOSIS — Z79899 Other long term (current) drug therapy: Secondary | ICD-10-CM | POA: Diagnosis not present

## 2023-08-13 DIAGNOSIS — R1115 Cyclical vomiting syndrome unrelated to migraine: Secondary | ICD-10-CM | POA: Diagnosis not present

## 2023-08-13 DIAGNOSIS — E785 Hyperlipidemia, unspecified: Secondary | ICD-10-CM | POA: Diagnosis not present

## 2023-08-13 DIAGNOSIS — E119 Type 2 diabetes mellitus without complications: Secondary | ICD-10-CM | POA: Diagnosis not present

## 2023-08-13 DIAGNOSIS — F32A Depression, unspecified: Secondary | ICD-10-CM | POA: Diagnosis not present

## 2023-08-13 DIAGNOSIS — E1165 Type 2 diabetes mellitus with hyperglycemia: Secondary | ICD-10-CM | POA: Diagnosis not present

## 2023-08-13 DIAGNOSIS — Z794 Long term (current) use of insulin: Secondary | ICD-10-CM | POA: Diagnosis not present

## 2023-08-14 ENCOUNTER — Telehealth: Payer: Self-pay | Admitting: Family Medicine

## 2023-08-14 ENCOUNTER — Telehealth: Payer: Self-pay

## 2023-08-14 ENCOUNTER — Inpatient Hospital Stay: Payer: No Typology Code available for payment source | Admitting: Physician Assistant

## 2023-08-14 NOTE — Telephone Encounter (Signed)
 Copied from CRM [id number]. Topic: Appointments - Scheduling Inquiry for Clinic >> Aug 14, 2023 10:04 AM Shon Hale wrote: Reason for CRM: Patient requesting hospital f/u with Dr. Sherrie Mustache. Patient seen at [facility] ER yesterday 08/13/2023. No availability until end of march with Dr. Sherrie Mustache. Patient wanting appointment with Dr. Sherrie Mustache if possible.

## 2023-08-14 NOTE — Telephone Encounter (Signed)
 He can have 11:20 slot on the 11th or the 2:40 slot on 14th

## 2023-08-14 NOTE — Transitions of Care (Post Inpatient/ED Visit) (Signed)
   08/14/2023  Name: Raymond Rose MRN: [id number] DOB: [birth year]  Today's TOC FU Call Status: Today's TOC FU Call Status:: Unsuccessful Call (1st Attempt) TOC FU Call Complete Date: 08/14/23  Attempted to reach the patient regarding the most recent Inpatient/ED visit.  Follow Up Plan: Additional outreach attempts will be made to reach the patient to complete the Transitions of Care (Post Inpatient/ED visit) call.   Signature Karena Addison, LPN [facility] Nurse Health Advisor Direct Dial [phone]

## 2023-08-16 ENCOUNTER — Other Ambulatory Visit: Payer: Self-pay | Admitting: Family Medicine

## 2023-08-16 DIAGNOSIS — G8929 Other chronic pain: Secondary | ICD-10-CM

## 2023-08-17 MED ORDER — OXYCODONE-ACETAMINOPHEN 7.5-325 MG PO TABS
1.0000 | ORAL_TABLET | ORAL | 0 refills | Status: DC | PRN
Start: 2023-08-17 — End: 2023-08-29

## 2023-08-19 NOTE — Transitions of Care (Post Inpatient/ED Visit) (Unsigned)
   08/19/2023  Name: Raymond Rose MRN: [id number] DOB: [birth year]  Today's TOC FU Call Status: Today's TOC FU Call Status:: Unsuccessful Call (2nd Attempt) Unsuccessful Call (2nd Attempt) Date: 08/19/23 [facility] FU Call Complete Date: 08/14/23  Attempted to reach the patient regarding the most recent Inpatient/ED visit.  Follow Up Plan: Additional outreach attempts will be made to reach the patient to complete the Transitions of Care (Post Inpatient/ED visit) call.   Signature Karena Addison, LPN [facility] Nurse Health Advisor Direct Dial [phone]

## 2023-08-20 ENCOUNTER — Other Ambulatory Visit: Payer: Self-pay | Admitting: Family Medicine

## 2023-08-20 DIAGNOSIS — F331 Major depressive disorder, recurrent, moderate: Secondary | ICD-10-CM | POA: Diagnosis not present

## 2023-08-20 DIAGNOSIS — G4700 Insomnia, unspecified: Secondary | ICD-10-CM | POA: Diagnosis not present

## 2023-08-20 DIAGNOSIS — F411 Generalized anxiety disorder: Secondary | ICD-10-CM | POA: Diagnosis not present

## 2023-08-20 NOTE — Transitions of Care (Post Inpatient/ED Visit) (Signed)
   08/20/2023  Name: Raymond Rose MRN: [id number] DOB: 11-01-1975  Today's TOC FU Call Status: Today's TOC FU Call Status:: Unsuccessful Call (3rd Attempt) Unsuccessful Call (2nd Attempt) Date: 08/19/23 Unsuccessful Call (3rd Attempt) Date: 08/20/23 [facility] FU Call Complete Date: 08/14/23  Attempted to reach the patient regarding the most recent Inpatient/ED visit.  Follow Up Plan: No further outreach attempts will be made at this time. We have been unable to contact the patient.  Signature Karena Addison, LPN [facility] Nurse Health Advisor Direct Dial [phone]

## 2023-08-21 NOTE — Telephone Encounter (Signed)
 Requested medication (s) are due for refill today- unsure  Requested medication (s) are on the active medication list -yes  Future visit scheduled -yes  Last refill: 03/21/23 #1 3RF  Notes to clinic: off protocol- provider review   Requested Prescriptions  Pending Prescriptions Disp Refills   fluconazole (DIFLUCAN) 150 MG tablet [Pharmacy Med Name: FLUCONAZOLE 150 MG TABLET] 1 tablet 3    Sig: TAKE 1 TABLET BY MOUTH AS ONE DOSE     Off-Protocol Failed - 08/21/2023  8:44 AM      Failed - Medication not assigned to a protocol, review manually.      Passed - Valid encounter within last 12 months    Recent Outpatient Visits           4 months ago Urinary hesitancy   [place] [facility] Malva Limes, MD   5 months ago Uncontrolled type 2 diabetes mellitus with hyperglycemia [facility])   [place] [facility] Malva Limes, MD   10 months ago Uncontrolled type 2 diabetes mellitus with hyperglycemia [facility])   [place] [facility] Malva Limes, MD   1 year ago Cyclic vomiting syndrome   [place] [facility] Malva Limes, MD   1 year ago Urinary hesitancy   [place] [facility] Malva Limes, MD       Future Appointments             In 2 months Fisher, Demetrios Isaacs, MD [facility]               Requested Prescriptions  Pending Prescriptions Disp Refills   fluconazole (DIFLUCAN) 150 MG tablet [Pharmacy Med Name: FLUCONAZOLE 150 MG TABLET] 1 tablet 3    Sig: TAKE 1 TABLET BY MOUTH AS ONE DOSE     Off-Protocol Failed - 08/21/2023  8:44 AM      Failed - Medication not assigned to a protocol, review manually.      Passed - Valid encounter within last 12 months    Recent Outpatient Visits           4 months ago Urinary hesitancy   [place] [facility] Malva Limes, MD   5 months ago Uncontrolled type 2 diabetes mellitus with  hyperglycemia [facility])   [place] [facility] Malva Limes, MD   10 months ago Uncontrolled type 2 diabetes mellitus with hyperglycemia [facility])   [place] [facility] Malva Limes, MD   1 year ago Cyclic vomiting syndrome   [facility] Malva Limes, MD   1 year ago Urinary hesitancy    [facility] Malva Limes, MD       Future Appointments             In 2 months Fisher, Demetrios Isaacs, MD [facility], [facility]

## 2023-08-26 ENCOUNTER — Inpatient Hospital Stay: Payer: No Typology Code available for payment source | Admitting: Family Medicine

## 2023-08-26 DIAGNOSIS — M5414 Radiculopathy, thoracic region: Secondary | ICD-10-CM | POA: Diagnosis not present

## 2023-08-26 DIAGNOSIS — M9902 Segmental and somatic dysfunction of thoracic region: Secondary | ICD-10-CM | POA: Diagnosis not present

## 2023-08-26 DIAGNOSIS — M9903 Segmental and somatic dysfunction of lumbar region: Secondary | ICD-10-CM | POA: Diagnosis not present

## 2023-08-26 DIAGNOSIS — M62830 Muscle spasm of back: Secondary | ICD-10-CM | POA: Diagnosis not present

## 2023-08-29 ENCOUNTER — Other Ambulatory Visit: Payer: Self-pay | Admitting: Family Medicine

## 2023-08-29 DIAGNOSIS — M545 Low back pain, unspecified: Secondary | ICD-10-CM

## 2023-09-03 DIAGNOSIS — M9903 Segmental and somatic dysfunction of lumbar region: Secondary | ICD-10-CM | POA: Diagnosis not present

## 2023-09-03 DIAGNOSIS — M62830 Muscle spasm of back: Secondary | ICD-10-CM | POA: Diagnosis not present

## 2023-09-03 DIAGNOSIS — M5414 Radiculopathy, thoracic region: Secondary | ICD-10-CM | POA: Diagnosis not present

## 2023-09-03 DIAGNOSIS — M9902 Segmental and somatic dysfunction of thoracic region: Secondary | ICD-10-CM | POA: Diagnosis not present

## 2023-09-03 MED ORDER — OXYCODONE-ACETAMINOPHEN 7.5-325 MG PO TABS: 1.0000 | ORAL_TABLET | ORAL | 0 refills | Status: DC | PRN

## 2023-09-08 ENCOUNTER — Other Ambulatory Visit: Payer: Self-pay | Admitting: Family Medicine

## 2023-09-08 ENCOUNTER — Inpatient Hospital Stay: Admitting: Family Medicine

## 2023-09-08 DIAGNOSIS — M62830 Muscle spasm of back: Secondary | ICD-10-CM | POA: Diagnosis not present

## 2023-09-08 DIAGNOSIS — M9902 Segmental and somatic dysfunction of thoracic region: Secondary | ICD-10-CM | POA: Diagnosis not present

## 2023-09-08 DIAGNOSIS — M9903 Segmental and somatic dysfunction of lumbar region: Secondary | ICD-10-CM | POA: Diagnosis not present

## 2023-09-08 DIAGNOSIS — M5414 Radiculopathy, thoracic region: Secondary | ICD-10-CM | POA: Diagnosis not present

## 2023-09-08 DIAGNOSIS — E119 Type 2 diabetes mellitus without complications: Secondary | ICD-10-CM

## 2023-09-10 ENCOUNTER — Other Ambulatory Visit: Payer: Self-pay | Admitting: Family Medicine

## 2023-09-10 DIAGNOSIS — G894 Chronic pain syndrome: Secondary | ICD-10-CM

## 2023-09-10 DIAGNOSIS — F419 Anxiety disorder, unspecified: Secondary | ICD-10-CM

## 2023-09-11 ENCOUNTER — Other Ambulatory Visit: Payer: Self-pay | Admitting: Family Medicine

## 2023-09-11 DIAGNOSIS — G44019 Episodic cluster headache, not intractable: Secondary | ICD-10-CM

## 2023-09-12 NOTE — Telephone Encounter (Signed)
 Requested medication (s) are due for refill today - no  Requested medication (s) are on the active medication list -no  Future visit scheduled -yes  Last refill: unknown  Notes to clinic: non delegated Rx, no longer on current medication list   Requested Prescriptions  Pending Prescriptions Disp Refills   predniSONE (DELTASONE) 10 MG tablet [Pharmacy Med Name: PREDNISONE 10 MG TABLET] 42 tablet 1    Sig: TAKE 6 TABLETS FOR 2 DAYS, THEN 5 FOR 2 DAYS, THEN 4 FOR 2 DAYS, THEN 3 FOR 2 DAYS, THEN 2 FOR 2 DAYS, THEN 1 FOR 2 DAYS.     Not Delegated - Endocrinology:  Oral Corticosteroids Failed - 09/12/2023 12:28 PM      Failed - This refill cannot be delegated      Failed - Manual Review: Eye exam for IOP if prolonged treatment      Failed - Glucose (serum) in normal range and within 180 days    Glucose  Date Value Ref Range Status  09/07/2014 149 (H) mg/dL Final    Comment:    40-98 NOTE: New Reference Range  08/23/14   09/07/2014 149 (H) mg/dL Final    Comment:    11-91 NOTE: New Reference Range  08/23/14   05/02/2014 156 mg/dL Final   Glucose, Bld  Date Value Ref Range Status  08/11/2023 203 (H) 70 - 99 mg/dL Final    Comment:    Glucose reference range applies only to samples taken after fasting for at least 8 hours.   Glucose-Capillary  Date Value Ref Range Status  11/20/2020 356 (H) 70 - 99 mg/dL Final    Comment:    Glucose reference range applies only to samples taken after fasting for at least 8 hours.         Failed - Last BP in normal range    BP Readings from Last 1 Encounters:  08/11/23 (!) 143/88         Failed - Bone Mineral Density or Dexa Scan completed in the last 2 years      Passed - K in normal range and within 180 days    Potassium  Date Value Ref Range Status  08/11/2023 4.6 3.5 - 5.1 mmol/L Final  09/07/2014 4.0 mmol/L Final    Comment:    3.5-5.1 NOTE: New Reference Range  08/23/14   09/07/2014 4.0 mmol/L Final    Comment:     3.5-5.1 NOTE: New Reference Range  08/23/14          Passed - Na in normal range and within 180 days    Sodium  Date Value Ref Range Status  08/11/2023 140 135 - 145 mmol/L Final  04/18/2023 139 134 - 144 mmol/L Final  09/07/2014 139 mmol/L Final    Comment:    135-145 NOTE: New Reference Range  08/23/14   09/07/2014 139 mmol/L Final    Comment:    135-145 NOTE: New Reference Range  08/23/14          Passed - Valid encounter within last 6 months    Recent Outpatient Visits           1 month ago Type 2 diabetes mellitus without complication, without long-term current use of insulin (HCC)   [place] [facility] Malva Limes, MD       Future Appointments             In 1 month Fisher, Demetrios Isaacs, MD [facility], [facility]               Requested Prescriptions  Pending Prescriptions Disp Refills   predniSONE (DELTASONE) 10 MG tablet [Pharmacy Med Name: PREDNISONE 10 MG TABLET] 42 tablet 1    Sig: TAKE 6 TABLETS FOR 2 DAYS, THEN 5 FOR 2 DAYS, THEN 4 FOR 2 DAYS, THEN 3 FOR 2 DAYS, THEN 2 FOR 2 DAYS, THEN 1 FOR 2 DAYS.     Not Delegated - Endocrinology:  Oral Corticosteroids Failed - 09/12/2023 12:28 PM      Failed - This refill cannot be delegated      Failed - Manual Review: Eye exam for IOP if prolonged treatment      Failed - Glucose (serum) in normal range and within 180 days    Glucose  Date Value Ref Range Status  09/07/2014 149 (H) mg/dL Final    Comment:    16-10 NOTE: New Reference Range  08/23/14   09/07/2014 149 (H) mg/dL Final    Comment:    96-04 NOTE: New Reference Range  08/23/14   05/02/2014 156 mg/dL Final   Glucose, Bld  Date Value Ref Range Status  08/11/2023 203 (H) 70 - 99 mg/dL Final    Comment:    Glucose reference range applies only to samples taken after fasting for at least 8 hours.   Glucose-Capillary  Date Value Ref Range Status  11/20/2020 356 (H) 70 - 99 mg/dL Final     Comment:    Glucose reference range applies only to samples taken after fasting for at least 8 hours.         Failed - Last BP in normal range    BP Readings from Last 1 Encounters:  08/11/23 (!) 143/88         Failed - Bone Mineral Density or Dexa Scan completed in the last 2 years      Passed - K in normal range and within 180 days    Potassium  Date Value Ref Range Status  08/11/2023 4.6 3.5 - 5.1 mmol/L Final  09/07/2014 4.0 mmol/L Final    Comment:    3.5-5.1 NOTE: New Reference Range  08/23/14   09/07/2014 4.0 mmol/L Final    Comment:    3.5-5.1 NOTE: New Reference Range  08/23/14          Passed - Na in normal range and within 180 days    Sodium  Date Value Ref Range Status  08/11/2023 140 135 - 145 mmol/L Final  04/18/2023 139 134 - 144 mmol/L Final  09/07/2014 139 mmol/L Final    Comment:    135-145 NOTE: New Reference Range  08/23/14   09/07/2014 139 mmol/L Final    Comment:    135-145 NOTE: New Reference Range  08/23/14          Passed - Valid encounter within last 6 months    Recent Outpatient Visits           1 month ago Type 2 diabetes mellitus without complication, without long-term current use of insulin (HCC)   [place] [facility] Malva Limes, MD       Future Appointments             In 1 month Fisher, Demetrios Isaacs, MD [facility], [facility]

## 2023-09-16 ENCOUNTER — Ambulatory Visit (INDEPENDENT_AMBULATORY_CARE_PROVIDER_SITE_OTHER): Admitting: Family Medicine

## 2023-09-16 ENCOUNTER — Encounter: Payer: Self-pay | Admitting: Family Medicine

## 2023-09-16 ENCOUNTER — Other Ambulatory Visit: Payer: Self-pay | Admitting: Family Medicine

## 2023-09-16 VITALS — BP 130/84 | HR 81 | Ht 73.0 in | Wt 292.0 lb

## 2023-09-16 DIAGNOSIS — R1011 Right upper quadrant pain: Secondary | ICD-10-CM

## 2023-09-16 DIAGNOSIS — M9902 Segmental and somatic dysfunction of thoracic region: Secondary | ICD-10-CM | POA: Diagnosis not present

## 2023-09-16 DIAGNOSIS — M9903 Segmental and somatic dysfunction of lumbar region: Secondary | ICD-10-CM | POA: Diagnosis not present

## 2023-09-16 DIAGNOSIS — M62830 Muscle spasm of back: Secondary | ICD-10-CM | POA: Diagnosis not present

## 2023-09-16 DIAGNOSIS — R339 Retention of urine, unspecified: Secondary | ICD-10-CM

## 2023-09-16 DIAGNOSIS — R102 Pelvic and perineal pain unspecified side: Secondary | ICD-10-CM

## 2023-09-16 DIAGNOSIS — M5414 Radiculopathy, thoracic region: Secondary | ICD-10-CM | POA: Diagnosis not present

## 2023-09-16 DIAGNOSIS — Z125 Encounter for screening for malignant neoplasm of prostate: Secondary | ICD-10-CM

## 2023-09-16 DIAGNOSIS — N390 Urinary tract infection, site not specified: Secondary | ICD-10-CM

## 2023-09-16 DIAGNOSIS — Z7985 Long-term (current) use of injectable non-insulin antidiabetic drugs: Secondary | ICD-10-CM | POA: Diagnosis not present

## 2023-09-16 DIAGNOSIS — M545 Low back pain, unspecified: Secondary | ICD-10-CM

## 2023-09-16 DIAGNOSIS — E66813 Obesity, class 3: Secondary | ICD-10-CM

## 2023-09-16 DIAGNOSIS — E119 Type 2 diabetes mellitus without complications: Secondary | ICD-10-CM

## 2023-09-16 DIAGNOSIS — R1115 Cyclical vomiting syndrome unrelated to migraine: Secondary | ICD-10-CM

## 2023-09-16 DIAGNOSIS — E278 Other specified disorders of adrenal gland: Secondary | ICD-10-CM

## 2023-09-16 LAB — POCT URINALYSIS DIPSTICK
Blood, UA: NEGATIVE
Glucose, UA: POSITIVE — AB
Ketones, UA: NEGATIVE
Leukocytes, UA: NEGATIVE
Nitrite, UA: NEGATIVE
Protein, UA: NEGATIVE
Spec Grav, UA: 1.02 (ref 1.010–1.025)
Urobilinogen, UA: 0.2 U/dL
pH, UA: 6.5 (ref 5.0–8.0)

## 2023-09-16 MED ORDER — CIPROFLOXACIN HCL 500 MG PO TABS: 500.0000 mg | ORAL_TABLET | Freq: Two times a day (BID) | ORAL | 0 refills | Status: DC

## 2023-09-16 NOTE — Patient Instructions (Signed)
 Please review the attached list of medications and notify my office if there are any errors.   Go to [facility] [employer] [facility]) Imaging at [facility] for your Xrays (phone no. [phone])

## 2023-09-16 NOTE — Progress Notes (Signed)
 Established patient visit   Patient: Raymond Rose   DOB: 28-Sep-1975   47 y.o. Male  MRN: [id number] Visit Date: 09/16/2023  Today's healthcare provider: Mila Merry, MD   Chief Complaint  Patient presents with   Cystitis    Onset 1 week , pressure in center lower abdomen, pressure when peeing, pt reports no pain or burning, feels like he is unable to fully void   Subjective    Discussed the use of AI scribe software for clinical note transcription with the patient, who gave verbal consent to proceed.  History of Present Illness   Raymond Rose is a 48 year old male with BPH and diabetes who presents with bladder discomfort and left rib pain.  He has been experiencing bladder discomfort for the past week, describing it as uncomfortable rather than painful. Urination does not alleviate the sensation. He has a history of benign prostatic hyperplasia (BPH) and is currently taking finasteride and alfuzosin, but these medications have not provided significant relief. He feels he cannot fully empty his bladder unless he has a bowel movement.  He reports pain under his left rib for two to three weeks, describing it as internal and not resembling a pulled muscle. The pain is tender to touch but does not restrict movement. No back pain or blood in urine is noted.  Regarding diabetes management, he has been on Ozempic at a dose of 0.25 mg for about a month, which has helped lower his blood sugar levels to between 180 and 210 mg/dL. However, he experiences nausea every morning and has visited the emergency room twice in the past month, attributing these visits to his usual [facility] rather than the medication.  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       Medications: Outpatient Medications Prior to Visit  Medication Sig   alfuzosin (UROXATRAL) 10 MG 24 hr tablet TAKE 1 TABLET (10  MG TOTAL) BY MOUTH DAILY WITH BREAKFAST.   ALPRAZolam (XANAX) 0.5 MG tablet TAKE 1 TABLET BY MOUTH EVERY 4 HOURS AS NEEDED FOR ANXIETY   ARIPiprazole (ABILIFY) 2 MG tablet Take 2 mg by mouth daily.   BD PEN NEEDLE NANO 2ND GEN 32G X 4 MM MISC USE AS DIRECTED 4 TIMES A DAY   blood glucose meter kit and supplies KIT Dispense based on patient and insurance preference. Use up to four times daily as directed. (FOR ICD-9 250.00, 250.01).   finasteride (PROSCAR) 5 MG tablet TAKE 1 TABLET (5 MG TOTAL) BY MOUTH DAILY.   fluconazole (DIFLUCAN) 150 MG tablet TAKE 1 TABLET BY MOUTH AS ONE DOSE   insulin aspart (NOVOLOG FLEXPEN) 100 UNIT/ML FlexPen Inject up to 20 units three times daily before meals as directed   Insulin Glargine (BASAGLAR KWIKPEN) 100 UNIT/ML INJECT 54 UNITS INTO THE SKIN DAILY.   lamoTRIgine (LAMICTAL) 100 MG tablet TAKE 1.5 TABLETS (150MG  TOTAL) BY MOUTH DAILY (Patient taking differently: No sig reported)   metFORMIN (GLUCOPHAGE-XR) 500 MG 24 hr tablet TAKE 2 TABLETS BY MOUTH TWICE A DAY   omeprazole (PRILOSEC) 40 MG capsule TAKE 1 CAPSULE BY MOUTH EVERY DAY   oxyCODONE-acetaminophen (PERCOCET) 7.5-325 MG tablet Take 1 tablet by mouth every 4 (four) hours as needed.   pioglitazone (ACTOS) 30 MG tablet TAKE 1 TABLET BY MOUTH EVERY DAY   pregabalin (LYRICA) 75 MG capsule TAKE 1 CAPSULE BY  MOUTH TWICE A DAY   prochlorperazine (COMPAZINE) 10 MG tablet TAKE 1/2 - 1 TABLETS BY MOUTH EVERY 6 (SIX) HOURS AS NEEDED FOR NAUSEA OR VOMITING.   promethazine (PHENERGAN) 25 MG tablet Take 25 mg by mouth every 6 (six) hours as needed for nausea or vomiting.   rosuvastatin (CRESTOR) 20 MG tablet TAKE 1 TABLET BY MOUTH EVERY DAY   Semaglutide,0.25 or 0.5MG /DOS, (OZEMPIC, 0.25 OR 0.5 MG/DOSE,) 2 MG/3ML SOPN Inject 0.25mg  weekly for 4 weeks, then increase to 0.5 mg weekly if tolerating   sildenafil (VIAGRA) 50 MG tablet Take 1 tablet (50 mg total) by mouth daily as needed for erectile dysfunction.    Vilazodone HCl (VIIBRYD) 40 MG TABS Take 1 tablet (40 mg total) by mouth daily.   No facility-administered medications prior to visit.   Review of Systems     Objective    BP 130/84   Pulse 81   Ht 6\' 1"  (1.854 m)   Wt 292 lb (132.5 kg)   SpO2 100%   BMI 38.52 kg/m   Physical Exam   General: Appearance:    Mildly obese male in no acute distress  Eyes:    PERRL, conjunctiva/corneas clear, EOM's intact       Lungs:     Clear to auscultation bilaterally, respirations unlabored  Heart:    Normal heart rate. Normal rhythm. No murmurs, rubs, or gallops.    Abd:   Obese, moderate suprapubic and RUQ tenderness, no masses.   Neurologic:   Awake, alert, oriented x 3. No apparent focal neurological defect.          Results for orders placed or performed in visit on 09/16/23  POCT urinalysis dipstick  Result Value Ref Range   Color, UA dark yellow    Clarity, UA clear    Glucose, UA Positive (A) Negative   Bilirubin, UA small    Ketones, UA N    Spec Grav, UA 1.020 1.010 - 1.025   Blood, UA N    pH, UA 6.5 5.0 - 8.0   Protein, UA Negative Negative   Urobilinogen, UA 0.2 0.2 or 1.0 E.U./dL   Nitrite, UA N    Leukocytes, UA Negative Negative   Appearance     Odor      Assessment & Plan       Bladder discomfort Reports bladder discomfort for the past week, described as uncomfortable rather than painful. The discomfort is distinct from previous BPH symptoms. Urinalysis showed no infection; culture pending. Discomfort may be related to the prostate or other urological issues. Initiating antibiotic therapy as a precaution. - Send urine for culture - Check PSA levels - Order KUB, consider pelvic and RUQ ultrasounds.  - Start cipro to cover therapy  Benign Prostatic Hyperplasia (BPH) Currently on finasteride and alfuzosin with no significant improvement. Bladder discomfort may be related to BPH, requiring further evaluation. Coordination with urologist is necessary due to  time-sensitive symptoms. - Continue finasteride and alfuzosin - He is in process of scheduling appointment with urologist  Left upper quadrant pain Reports pain under the left rib for 2-3 weeks, unrelated to injury or muscle strain. Tender upon examination, possibly related to the adrenal mass or other abdominal issues. No recent CT scan; x-ray ordered for further assessment. 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 Potential growth may contribute to left upper quadrant pain. Further assessment needed. - Consider  CT scan if needed  Type 2 Diabetes Mellitus On Ozempic at 0.25 mg. Blood sugar levels improved to 180-210 mg/dL. Experienced initial nausea, attributed to usual symptoms. ER visits last month possibly related to diabetes management. - Continue Ozempic at 0.25 mg - Monitor blood sugar levels         Mila Merry, MD  [facility] [phone] (phone) [phone] (fax)  [facility]

## 2023-09-17 ENCOUNTER — Ambulatory Visit
Admission: RE | Admit: 2023-09-17 | Discharge: 2023-09-17 | Disposition: A | Discharge status: Home / Self Care | Source: Ambulatory Visit | Attending: Family Medicine | Admitting: Family Medicine

## 2023-09-17 DIAGNOSIS — R339 Retention of urine, unspecified: Secondary | ICD-10-CM | POA: Diagnosis not present

## 2023-09-17 DIAGNOSIS — Z125 Encounter for screening for malignant neoplasm of prostate: Secondary | ICD-10-CM | POA: Diagnosis not present

## 2023-09-18 ENCOUNTER — Encounter: Payer: Self-pay | Admitting: Family Medicine

## 2023-09-18 LAB — URINE CULTURE

## 2023-09-18 LAB — PSA TOTAL (REFLEX TO FREE): Prostate Specific Ag, Serum: 0.3 ng/mL (ref 0.0–4.0)

## 2023-09-18 LAB — SPECIMEN STATUS REPORT

## 2023-09-18 MED ORDER — OXYCODONE-ACETAMINOPHEN 7.5-325 MG PO TABS: 1.0000 | ORAL_TABLET | ORAL | 0 refills | Status: DC | PRN

## 2023-09-22 ENCOUNTER — Other Ambulatory Visit: Payer: Self-pay | Admitting: Family Medicine

## 2023-09-22 DIAGNOSIS — R339 Retention of urine, unspecified: Secondary | ICD-10-CM

## 2023-09-22 MED ORDER — CIPROFLOXACIN HCL 500 MG PO TABS: 500.0000 mg | ORAL_TABLET | Freq: Two times a day (BID) | ORAL | 0 refills | Status: AC

## 2023-09-25 DIAGNOSIS — M62830 Muscle spasm of back: Secondary | ICD-10-CM | POA: Diagnosis not present

## 2023-09-25 DIAGNOSIS — M5414 Radiculopathy, thoracic region: Secondary | ICD-10-CM | POA: Diagnosis not present

## 2023-09-25 DIAGNOSIS — M9902 Segmental and somatic dysfunction of thoracic region: Secondary | ICD-10-CM | POA: Diagnosis not present

## 2023-09-25 DIAGNOSIS — M9903 Segmental and somatic dysfunction of lumbar region: Secondary | ICD-10-CM | POA: Diagnosis not present

## 2023-09-29 DIAGNOSIS — M62830 Muscle spasm of back: Secondary | ICD-10-CM | POA: Diagnosis not present

## 2023-09-29 DIAGNOSIS — M5414 Radiculopathy, thoracic region: Secondary | ICD-10-CM | POA: Diagnosis not present

## 2023-09-29 DIAGNOSIS — M9902 Segmental and somatic dysfunction of thoracic region: Secondary | ICD-10-CM | POA: Diagnosis not present

## 2023-09-29 DIAGNOSIS — M9903 Segmental and somatic dysfunction of lumbar region: Secondary | ICD-10-CM | POA: Diagnosis not present

## 2023-10-01 ENCOUNTER — Other Ambulatory Visit: Payer: Self-pay | Admitting: Family Medicine

## 2023-10-01 DIAGNOSIS — M545 Low back pain, unspecified: Secondary | ICD-10-CM

## 2023-10-02 DIAGNOSIS — M62830 Muscle spasm of back: Secondary | ICD-10-CM | POA: Diagnosis not present

## 2023-10-02 DIAGNOSIS — M9903 Segmental and somatic dysfunction of lumbar region: Secondary | ICD-10-CM | POA: Diagnosis not present

## 2023-10-02 DIAGNOSIS — M5414 Radiculopathy, thoracic region: Secondary | ICD-10-CM | POA: Diagnosis not present

## 2023-10-02 DIAGNOSIS — M9902 Segmental and somatic dysfunction of thoracic region: Secondary | ICD-10-CM | POA: Diagnosis not present

## 2023-10-03 ENCOUNTER — Other Ambulatory Visit: Payer: Self-pay | Admitting: Family Medicine

## 2023-10-03 DIAGNOSIS — M545 Low back pain, unspecified: Secondary | ICD-10-CM

## 2023-10-05 MED ORDER — OXYCODONE-ACETAMINOPHEN 7.5-325 MG PO TABS
1.0000 | ORAL_TABLET | ORAL | 0 refills | Status: DC | PRN
Start: 2023-10-05 — End: 2023-10-15

## 2023-10-09 ENCOUNTER — Other Ambulatory Visit: Payer: Self-pay | Admitting: Family Medicine

## 2023-10-09 DIAGNOSIS — M9902 Segmental and somatic dysfunction of thoracic region: Secondary | ICD-10-CM | POA: Diagnosis not present

## 2023-10-09 DIAGNOSIS — M5414 Radiculopathy, thoracic region: Secondary | ICD-10-CM | POA: Diagnosis not present

## 2023-10-09 DIAGNOSIS — M9903 Segmental and somatic dysfunction of lumbar region: Secondary | ICD-10-CM | POA: Diagnosis not present

## 2023-10-09 DIAGNOSIS — M62830 Muscle spasm of back: Secondary | ICD-10-CM | POA: Diagnosis not present

## 2023-10-10 DIAGNOSIS — M5414 Radiculopathy, thoracic region: Secondary | ICD-10-CM | POA: Diagnosis not present

## 2023-10-10 DIAGNOSIS — M9903 Segmental and somatic dysfunction of lumbar region: Secondary | ICD-10-CM | POA: Diagnosis not present

## 2023-10-10 DIAGNOSIS — M62830 Muscle spasm of back: Secondary | ICD-10-CM | POA: Diagnosis not present

## 2023-10-10 DIAGNOSIS — M9902 Segmental and somatic dysfunction of thoracic region: Secondary | ICD-10-CM | POA: Diagnosis not present

## 2023-10-10 NOTE — Telephone Encounter (Signed)
 Requested medications are due for refill today.  unsure  Requested medications are on the active medications list.  yes  Last refill. 08/22/2023 1 3 rf  Future visit scheduled.   yes  Notes to clinic.  Medication not assigned to a protocol. Please review for refill.    Requested Prescriptions  Pending Prescriptions Disp Refills   fluconazole  (DIFLUCAN ) 150 MG tablet [Pharmacy Med Name: FLUCONAZOLE  150 MG TABLET] 1 tablet 3    Sig: TAKE 1 TABLET BY MOUTH AS ONE DOSE     Off-Protocol Failed - 10/10/2023 11:20 AM      Failed - Medication not assigned to a protocol, review manually.      Passed - Valid encounter within last 12 months    Recent Outpatient Visits           3 weeks ago Urinary tract infection without hematuria, site unspecified   [facility] Lamon Pillow, MD   2 months ago Type 2 diabetes mellitus without complication, without long-term current use of insulin  [facility])   [facility] Lamon Pillow, MD       Future Appointments             In 2 weeks Fisher, Erlinda Haws, MD [facility]

## 2023-10-13 DIAGNOSIS — M5414 Radiculopathy, thoracic region: Secondary | ICD-10-CM | POA: Diagnosis not present

## 2023-10-13 DIAGNOSIS — M9902 Segmental and somatic dysfunction of thoracic region: Secondary | ICD-10-CM | POA: Diagnosis not present

## 2023-10-13 DIAGNOSIS — M9903 Segmental and somatic dysfunction of lumbar region: Secondary | ICD-10-CM | POA: Diagnosis not present

## 2023-10-13 DIAGNOSIS — M62830 Muscle spasm of back: Secondary | ICD-10-CM | POA: Diagnosis not present

## 2023-10-15 ENCOUNTER — Other Ambulatory Visit: Payer: Self-pay | Admitting: Family Medicine

## 2023-10-15 ENCOUNTER — Telehealth: Payer: Self-pay | Admitting: Family Medicine

## 2023-10-15 ENCOUNTER — Ambulatory Visit: Payer: Self-pay | Admitting: *Deleted

## 2023-10-15 DIAGNOSIS — M9902 Segmental and somatic dysfunction of thoracic region: Secondary | ICD-10-CM | POA: Diagnosis not present

## 2023-10-15 DIAGNOSIS — M9903 Segmental and somatic dysfunction of lumbar region: Secondary | ICD-10-CM | POA: Diagnosis not present

## 2023-10-15 DIAGNOSIS — G8929 Other chronic pain: Secondary | ICD-10-CM

## 2023-10-15 DIAGNOSIS — M5414 Radiculopathy, thoracic region: Secondary | ICD-10-CM | POA: Diagnosis not present

## 2023-10-15 DIAGNOSIS — M62830 Muscle spasm of back: Secondary | ICD-10-CM | POA: Diagnosis not present

## 2023-10-15 NOTE — Telephone Encounter (Signed)
  Chief Complaint: increased symptoms after antibiotic-Ciprofloxacin  HCl 500 mg   Symptoms: Patient reports he still has urinary pain after treatment for UTI and has developed increased internal pressure at prostate.  Frequency: OV 09/16/23 Pertinent Negatives: Patient denies fever Disposition: [] ED /[] Urgent Care (no appt availability in office) / [x] Appointment(In office/virtual)/ []  [place] [facility]/ [] Home Care/ [] Refused Recommended Disposition /[] [facility]/ []  Follow-up with PCP Additional Notes: Call to [facility]- [place]- PCP does not have open appointment- but she canoffer to schedule him at [facility]- she is call patient back.    Copied from CRM [id number]. Topic: Clinical - Red Word Triage >> Oct 15, 2023  9:33 AM Donald Frost wrote: Red Word that prompted transfer to Nurse Triage: The Patient called in stating he finished a course of meds for a prostate infection but he feels worse. He wants to be seen sooner but because he is hurting and doing worse I will transfer him to [facility] Reason for Disposition  [1] Taking antibiotic > 24 hours for UTI AND [2] flank or lower back pain worsening  Answer Assessment - Initial Assessment Questions 1. MAIN SYMPTOM: "What is the main symptom you are concerned about?" (e.g., painful urination, urine frequency)     Pain in bowels- pressure/pain 2. BETTER-SAME-WORSE: "Are you getting better, staying the same, or getting worse compared to how you felt at your last visit to the doctor (most recent medical visit)?"     Still having same symptoms plus additional symptoms 3. PAIN: "How bad is the pain?"  (e.g., Scale 1-10; mild, moderate, or severe)   - MILD (1-3): complains slightly about urination hurting   - MODERATE (4-7): interferes with normal activities     - SEVERE (8-10): excruciating, unwilling or unable to urinate because of the pain      Moderate 4/10 4. FEVER: "Do you have a fever?" If Yes, ask: "What is it, how was it measured, and when did  it start?"     no 5. OTHER SYMPTOMS: "Do you have any other symptoms?" (e.g., blood in the urine, flank pain, scrotal pain or swelling)     fatigue 6. DIAGNOSIS: "When was the UTI diagnosed?" "By whom?" "Was it a kidney infection, bladder infection or both?"     PCP 7. ANTIBIOTIC: "What antibiotic(s) are you taking?" "How many times per day?"     Ciprofloxacin  HCl 500 mg  8. ANTIBIOTIC - START DATE: "When did you start taking the antibiotic?"     Finished antibiotic few days ago  Protocols used: Urinary Tract Infection on Antibiotic Follow-up Call - [facility]

## 2023-10-17 ENCOUNTER — Encounter: Payer: Self-pay | Admitting: Family Medicine

## 2023-10-17 ENCOUNTER — Ambulatory Visit (INDEPENDENT_AMBULATORY_CARE_PROVIDER_SITE_OTHER): Admitting: Family Medicine

## 2023-10-17 VITALS — BP 116/80 | HR 110 | Resp 16 | Ht 73.0 in | Wt 289.0 lb

## 2023-10-17 DIAGNOSIS — E119 Type 2 diabetes mellitus without complications: Secondary | ICD-10-CM

## 2023-10-17 DIAGNOSIS — R300 Dysuria: Secondary | ICD-10-CM

## 2023-10-17 DIAGNOSIS — G894 Chronic pain syndrome: Secondary | ICD-10-CM

## 2023-10-17 DIAGNOSIS — R339 Retention of urine, unspecified: Secondary | ICD-10-CM

## 2023-10-17 DIAGNOSIS — Q5564 Hidden penis: Secondary | ICD-10-CM | POA: Diagnosis not present

## 2023-10-17 DIAGNOSIS — N390 Urinary tract infection, site not specified: Secondary | ICD-10-CM | POA: Diagnosis not present

## 2023-10-17 DIAGNOSIS — N9489 Other specified conditions associated with female genital organs and menstrual cycle: Secondary | ICD-10-CM

## 2023-10-17 LAB — POCT URINALYSIS DIPSTICK
Bilirubin, UA: NEGATIVE
Blood, UA: NEGATIVE
Glucose, UA: POSITIVE — AB
Ketones, UA: POSITIVE
Leukocytes, UA: NEGATIVE
Nitrite, UA: NEGATIVE
Protein, UA: NEGATIVE
Spec Grav, UA: 1.02 (ref 1.010–1.025)
Urobilinogen, UA: 0.2 U/dL
pH, UA: 5 (ref 5.0–8.0)

## 2023-10-17 MED ORDER — TIRZEPATIDE 5 MG/0.5ML ~~LOC~~ SOAJ
5.0000 mg | SUBCUTANEOUS | Status: DC
Start: 2023-10-17 — End: 2023-12-07

## 2023-10-17 MED ORDER — PREGABALIN 150 MG PO CAPS
150.0000 mg | ORAL_CAPSULE | Freq: Two times a day (BID) | ORAL | 2 refills | Status: DC
Start: 2023-10-17 — End: 2024-03-10

## 2023-10-17 MED ORDER — OXYCODONE-ACETAMINOPHEN 7.5-325 MG PO TABS: 1.0000 | ORAL_TABLET | ORAL | 0 refills | Status: DC | PRN

## 2023-10-17 NOTE — Progress Notes (Signed)
 Established patient visit   Patient: Raymond Rose   DOB: 05/23/1976   47 y.o. Male  MRN: [id number] Visit Date: 10/17/2023  Today's healthcare provider: Jeralene Mom, MD   Chief Complaint  Patient presents with   Dysuria   Subjective    Discussed the use of AI scribe software for clinical note transcription with the patient, who gave verbal consent to proceed.  History of Present Illness   Raymond Rose is a 48 year old male with benign prostatic hyperplasia who presents with urinary and bowel symptoms.  He has ongoing issues with emptying his bladder and a sensation of pressure in the pelvic area. He describes a feeling of incomplete bowel evacuation, which persists even after a bowel movement. There is mild discomfort and a sensation of pressure, but no significant pain with bowel movements. He has a history of benign prostatic hyperplasia and has been taking alfuzosin  and finasteride  for a couple of years.  He recently completed a course of Cipro  for GBS UTI. He was previously by followed Dr. Francesco Inks who has retired. 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   He reports significant fatigue, having spent most of the last week sleeping, which is unusual for him as he typically has difficulty sleeping.  He is currently taking Lyrica  75 mg twice a day for fibromyalgia but reports no relief from symptoms, describing a feeling of being 'beat up all the time'. He has been seeing a chiropractor and receiving massages without improvement.  He has diabetes and mentions a recent change in insurance allowing him to resume Mounjaro , as Ozempic  exacerbated his stomach issues. He has not taken Ozempic  for two to three weeks and plans to pick up a prescription for Mounjaro  5 mg.       Medications: Outpatient Medications Prior  to Visit  Medication Sig   alfuzosin  (UROXATRAL ) 10 MG 24 hr tablet TAKE 1 TABLET (10 MG TOTAL) BY MOUTH DAILY WITH BREAKFAST.   ALPRAZolam  (XANAX ) 0.5 MG tablet TAKE 1 TABLET BY MOUTH EVERY 4 HOURS AS NEEDED FOR ANXIETY   ARIPiprazole (ABILIFY) 2 MG tablet Take 2 mg by mouth daily.   BD PEN NEEDLE NANO 2ND GEN 32G X 4 MM MISC USE AS DIRECTED 4 TIMES A DAY   blood glucose meter kit and supplies KIT Dispense based on patient and insurance preference. Use up to four times daily as directed. (FOR ICD-9 250.00, 250.01).   finasteride  (PROSCAR ) 5 MG tablet TAKE 1 TABLET (5 MG TOTAL) BY MOUTH DAILY.   fluconazole  (DIFLUCAN ) 150 MG tablet TAKE 1 TABLET BY MOUTH AS ONE DOSE   insulin  aspart (NOVOLOG  FLEXPEN) 100 UNIT/ML FlexPen Inject up to 20 units three times daily before meals as directed   Insulin  Glargine (BASAGLAR  KWIKPEN) 100 UNIT/ML INJECT 54 UNITS INTO THE SKIN DAILY.   lamoTRIgine  (LAMICTAL ) 100 MG tablet TAKE 1.5 TABLETS (150MG  TOTAL) BY MOUTH DAILY (Patient taking differently: No sig reported)   metFORMIN  (GLUCOPHAGE -XR) 500 MG 24 hr tablet TAKE 2 TABLETS BY MOUTH TWICE A DAY   omeprazole  (PRILOSEC) 40 MG capsule TAKE 1 CAPSULE BY MOUTH EVERY DAY   oxyCODONE -acetaminophen  (PERCOCET) 7.5-325 MG tablet Take 1 tablet by mouth every 4 (four) hours as needed.   pioglitazone  (ACTOS ) 30 MG tablet TAKE 1 TABLET BY MOUTH EVERY DAY   prochlorperazine  (COMPAZINE )  10 MG tablet TAKE 1/2 - 1 TABLETS BY MOUTH EVERY 6 (SIX) HOURS AS NEEDED FOR NAUSEA OR VOMITING.   promethazine  (PHENERGAN ) 25 MG tablet Take 25 mg by mouth every 6 (six) hours as needed for nausea or vomiting.   rosuvastatin  (CRESTOR ) 20 MG tablet TAKE 1 TABLET BY MOUTH EVERY DAY   Semaglutide ,0.25 or 0.5MG /DOS, (OZEMPIC , 0.25 OR 0.5 MG/DOSE,) 2 MG/3ML SOPN Inject 0.25mg  weekly for 4 weeks, then increase to 0.5 mg weekly if tolerating   sildenafil  (VIAGRA ) 50 MG tablet Take 1 tablet (50 mg total) by mouth daily as needed for erectile  dysfunction.   Vilazodone  HCl (VIIBRYD ) 40 MG TABS Take 1 tablet (40 mg total) by mouth daily.   pregabalin  (LYRICA ) 75 MG capsule TAKE 1 CAPSULE BY MOUTH TWICE A DAY   No facility-administered medications prior to visit.   Review of Systems     Objective    BP 116/80 (BP Location: Left Arm, Patient Position: Sitting, Cuff Size: Large)   Pulse (!) 110   Resp 16   Ht 6\' 1"  (1.854 m)   Wt 289 lb (131.1 kg)   SpO2 98%   BMI 38.13 kg/m   Physical Exam   General appearance: Mildly obese male, cooperative and in no acute distress Head: Normocephalic, without obvious abnormality, atraumatic Respiratory: Respirations even and unlabored, normal respiratory rate Extremities: All extremities are intact.  Skin: Skin color, texture, turgor normal. No rashes seen  Psych: Appropriate mood and affect. Neurologic: Mental status: Alert, oriented to person, place, and time, thought content appropriate.     Results for orders placed or performed in visit on 10/17/23  POCT urinalysis dipstick  Result Value Ref Range   Color, UA Yellow    Clarity, UA Clear    Glucose, UA Positive (A) Negative   Bilirubin, UA Negative    Ketones, UA Positive    Spec Grav, UA 1.020 1.010 - 1.025   Blood, UA Negative    pH, UA 5.0 5.0 - 8.0   Protein, UA Negative Negative   Urobilinogen, UA 0.2 0.2 or 1.0 E.U./dL   Nitrite, UA Negative    Leukocytes, UA Negative Negative   Appearance     Odor      Assessment & Plan     1. Urinary retention (Primary) Previously followed by Dr. Sullivan Endow for years on finasteride  and alfluzosin which are no longer controlling symptoms. Now with progressive bladder and bowel pressure suspected to be from enlarged prostate. Will obtain pelvic ultrasound.   Needs to establish with local urologist since Dr. Francesco Inks has retired and symptoms are no longer controlled with alfluzosin and finasteride .   His appointment at [facility] urology clinic in August is with specialist for  treatment of congenital buried penis and will not be addressing his urinary and prostate symptoms at all.   - Ambulatory referral to local urologist   2. Pelvic pressure syndrome  - Obtain pelvic ultrasound.   3. Dysuria Recently finished course of ciprofloxacin  for BHS.  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   5. Chronic pain syndrome Increase  pregabalin  (LYRICA ) to 150 MG capsule; Take 1 capsule (150 mg total) by mouth 2 (two) times daily.  Dispense: 60 capsule; Refill: 2  6. Type 2 diabetes mellitus without complication, without long-term current use of insulin  (HCC) Not tolerating Ozempic  as well as he tolerated Mounjaro . His insurance now covers so he will  resume 5mg  Mounjaro  that he was previously prescribed.  - tirzepatide  (MOUNJARO ) 5 MG/0.5ML Pen; Inject 5 mg into the skin once a week.      Jeralene Mom, MD  [facility] [phone] (phone) [phone] (fax)  [facility]

## 2023-10-21 LAB — CULTURE, URINE COMPREHENSIVE

## 2023-10-22 ENCOUNTER — Encounter: Payer: Self-pay | Admitting: Family Medicine

## 2023-10-22 DIAGNOSIS — M62830 Muscle spasm of back: Secondary | ICD-10-CM | POA: Diagnosis not present

## 2023-10-22 DIAGNOSIS — M9902 Segmental and somatic dysfunction of thoracic region: Secondary | ICD-10-CM | POA: Diagnosis not present

## 2023-10-22 DIAGNOSIS — M5414 Radiculopathy, thoracic region: Secondary | ICD-10-CM | POA: Diagnosis not present

## 2023-10-22 DIAGNOSIS — M9903 Segmental and somatic dysfunction of lumbar region: Secondary | ICD-10-CM | POA: Diagnosis not present

## 2023-10-23 ENCOUNTER — Encounter: Payer: Self-pay | Admitting: Family Medicine

## 2023-10-23 ENCOUNTER — Ambulatory Visit
Admission: RE | Admit: 2023-10-23 | Discharge: 2023-10-23 | Disposition: A | Discharge status: Home / Self Care | Source: Ambulatory Visit | Attending: Family Medicine | Admitting: Family Medicine

## 2023-10-23 DIAGNOSIS — R339 Retention of urine, unspecified: Secondary | ICD-10-CM | POA: Diagnosis not present

## 2023-10-23 DIAGNOSIS — R102 Pelvic and perineal pain: Secondary | ICD-10-CM | POA: Diagnosis not present

## 2023-10-23 DIAGNOSIS — R300 Dysuria: Secondary | ICD-10-CM

## 2023-10-23 DIAGNOSIS — N9489 Other specified conditions associated with female genital organs and menstrual cycle: Secondary | ICD-10-CM

## 2023-10-27 ENCOUNTER — Ambulatory Visit: Payer: No Typology Code available for payment source | Admitting: Family Medicine

## 2023-10-28 ENCOUNTER — Other Ambulatory Visit: Payer: Self-pay | Admitting: Family Medicine

## 2023-10-28 ENCOUNTER — Ambulatory Visit: Admitting: Urology

## 2023-10-28 VITALS — BP 117/75 | HR 97 | Ht 73.0 in | Wt 293.0 lb

## 2023-10-28 DIAGNOSIS — R339 Retention of urine, unspecified: Secondary | ICD-10-CM

## 2023-10-28 DIAGNOSIS — N418 Other inflammatory diseases of prostate: Secondary | ICD-10-CM | POA: Diagnosis not present

## 2023-10-28 DIAGNOSIS — G8929 Other chronic pain: Secondary | ICD-10-CM

## 2023-10-28 LAB — URINALYSIS, COMPLETE
Bilirubin, UA: NEGATIVE
Ketones, UA: NEGATIVE
Leukocytes,UA: NEGATIVE
Nitrite, UA: NEGATIVE
RBC, UA: NEGATIVE
Specific Gravity, UA: 1.03 (ref 1.005–1.030)
Urobilinogen, Ur: 1 mg/dL (ref 0.2–1.0)
pH, UA: 6 (ref 5.0–7.5)

## 2023-10-28 LAB — MICROSCOPIC EXAMINATION: Epithelial Cells (non renal): >10 /HPF — AB (ref 0–10)

## 2023-10-28 LAB — BLADDER SCAN AMB NON-IMAGING

## 2023-10-28 MED ORDER — OXYCODONE-ACETAMINOPHEN 7.5-325 MG PO TABS
1.0000 | ORAL_TABLET | ORAL | 0 refills | Status: DC | PRN
Start: 2023-10-28 — End: 2023-11-19

## 2023-10-28 MED ORDER — MELOXICAM 15 MG PO TABS: 15.0000 mg | ORAL_TABLET | Freq: Every day | ORAL | 0 refills | Status: AC

## 2023-10-28 NOTE — Progress Notes (Signed)
 I,[name] L Pierron,acting as a scribe for Dustin Gimenez, MD.,have documented all relevant documentation on the behalf of Dustin Gimenez, MD,as directed by  Dustin Gimenez, MD while in the presence of Dustin Gimenez, MD.  10/28/2023 4:02 PM   Raymond Rose Feb 03, 1976 [id number]  Referring provider: Lamon Pillow, MD [address] [place],  [place] [address]  Chief Complaint  Patient presents with   New Patient (Initial Visit)    HPI: 48 year-old male presents today for further evaluation of urinary issues as referred by his PCP Dr. Shann Darnel.   He has a history of dysuria, previously followed by Dr. Fredrick Jenkins, and has been managed on Finasteride  and Alfuzosin .   He also has a history of congenital micropenis and is seeing specialists at [facility] for this issue.   His PSA was 0.3 a month ago, and a recent renal ultrasound showed a large right adrenal gland lesion, previously identified as an adrenal myelolipoma, with minimal change since 2019. He also had this characterized in the remote past in 2014 with an MRI. His kidneys are normal.  His PVR today is 87, and urinalysis is negative except for trace glucose and epithelial cells.  His urinary symptoms are no longer well-controlled, and he reports not being able to fully empty his bladder, which has worsened over time. He was put on Flomax but hasn't noticed any improvement. He has been experiencing pain in the deep pelvis area, between the bladder and bowels. This has been going on for a few weeks.    He has poorly controlled diabetes with a recent A1C of 10, and he is restarting Mounjaro  after insurance issues.   He reports cyclical vomiting and significant weight loss from 450 pounds to 280 pounds, resulting in excess skin and frequent infections. He is often dehydrated as a result of this.  Results for orders placed or performed in visit on 10/28/23  BLADDER SCAN AMB NON-IMAGING  Result Value Ref Range    Scan Result 97ml     PMH: Past Medical History:  Diagnosis Date   Anxiety    Arthritis    COVID-19 06/2019   COVID-19 virus infection 07/03/2019   Cyclical vomiting    Depression    OSA on CPAP    Vertigo 01/03/2015    Surgical History: Past Surgical History:  Procedure Laterality Date   BONE TUMOR EXCISION  1992   left arm   CHOLECYSTECTOMY  2010   lap Cholecystectomy   ct scan of abdomen  06/18/2012   with Contrast; 7cm right adrenal mass. Myolipoma vs liposarcome, 3cm adrenal mass on left   CT scan of Brain  12/23/2011   without contrast, [facility], Normal   TONSILLECTOMY  1985    Home Medications:  Allergies as of 10/28/2023       Reactions   Bupropion     Extreme anxiety   Morphine  Other (See Comments)        Medication List        Accurate as of Oct 28, 2023  4:02 PM. If you have any questions, ask your nurse or doctor.          alfuzosin  10 MG 24 hr tablet Commonly known as: UROXATRAL  TAKE 1 TABLET (10 MG TOTAL) BY MOUTH DAILY WITH BREAKFAST.   ALPRAZolam  0.5 MG tablet Commonly known as: XANAX  TAKE 1 TABLET BY MOUTH EVERY 4 HOURS AS NEEDED FOR ANXIETY   ARIPiprazole 2 MG tablet Commonly known as: ABILIFY Take 2 mg  by mouth daily.   Basaglar  KwikPen 100 UNIT/ML INJECT 54 UNITS INTO THE SKIN DAILY.   BD Pen Needle Nano 2nd Gen 32G X 4 MM Misc Generic drug: Insulin  Pen Needle USE AS DIRECTED 4 TIMES A DAY   blood glucose meter kit and supplies Kit Dispense based on patient and insurance preference. Use up to four times daily as directed. (FOR ICD-9 250.00, 250.01).   finasteride  5 MG tablet Commonly known as: PROSCAR  TAKE 1 TABLET (5 MG TOTAL) BY MOUTH DAILY.   fluconazole  150 MG tablet Commonly known as: DIFLUCAN  TAKE 1 TABLET BY MOUTH AS ONE DOSE   lamoTRIgine  100 MG tablet Commonly known as: LAMICTAL  TAKE 1.5 TABLETS (150MG  TOTAL) BY MOUTH DAILY   meloxicam  15 MG tablet Commonly known as: MOBIC  Take 1 tablet (15 mg total) by mouth  daily. Started by: Dustin Gimenez   metFORMIN  500 MG 24 hr tablet Commonly known as: GLUCOPHAGE -XR TAKE 2 TABLETS BY MOUTH TWICE A DAY   NovoLOG  FlexPen 100 UNIT/ML FlexPen Generic drug: insulin  aspart Inject up to 20 units three times daily before meals as directed   omeprazole  40 MG capsule Commonly known as: PRILOSEC TAKE 1 CAPSULE BY MOUTH EVERY DAY   oxyCODONE -acetaminophen  7.5-325 MG tablet Commonly known as: PERCOCET Take 1 tablet by mouth every 4 (four) hours as needed.   pioglitazone  30 MG tablet Commonly known as: ACTOS  TAKE 1 TABLET BY MOUTH EVERY DAY   pregabalin  150 MG capsule Commonly known as: LYRICA  Take 1 capsule (150 mg total) by mouth 2 (two) times daily.   prochlorperazine  10 MG tablet Commonly known as: COMPAZINE  TAKE 1/2 - 1 TABLETS BY MOUTH EVERY 6 (SIX) HOURS AS NEEDED FOR NAUSEA OR VOMITING.   promethazine  25 MG tablet Commonly known as: PHENERGAN  Take 25 mg by mouth every 6 (six) hours as needed for nausea or vomiting.   rosuvastatin  20 MG tablet Commonly known as: CRESTOR  TAKE 1 TABLET BY MOUTH EVERY DAY   sildenafil  50 MG tablet Commonly known as: VIAGRA  Take 1 tablet (50 mg total) by mouth daily as needed for erectile dysfunction.   tirzepatide  5 MG/0.5ML Pen Commonly known as: MOUNJARO  Inject 5 mg into the skin once a week.   Vilazodone  HCl 40 MG Tabs Commonly known as: Viibryd  Take 1 tablet (40 mg total) by mouth daily.        Allergies:  Allergies  Allergen Reactions   Bupropion      Extreme anxiety   Morphine  Other (See Comments)    Family History: Family History  Problem Relation Age of Onset   Depression Mother    Heart attack Father 3   Heart attack Brother    Arthritis Other    Alcohol abuse Other    Lung cancer Other    Hyperlipidemia Other    CAD Other    Stroke Other    Hypertension Other    Bipolar disorder Other     Social History:  reports that he quit smoking about 26 years ago. His smoking use  included cigarettes. He started smoking about 31 years ago. He has never used smokeless tobacco. He reports that he does not currently use alcohol. He reports current drug use. Frequency: 7.00 times per week. Drug: Marijuana.   Physical Exam: BP 117/75   Pulse 97   Ht 6\' 1"  (1.854 m)   Wt 293 lb (132.9 kg)   BMI 38.66 kg/m   Constitutional:  Alert and oriented, No acute distress. HEENT: [place] AT, moist mucus  membranes.  Trachea midline, no masses. Neurologic: Grossly intact, no focal deficits, moving all 4 extremities. Psychiatric: Normal mood and affect.  Pertinent Imaging: Results for orders placed during the hospital encounter of 10/23/23  US  RENAL  Narrative CLINICAL DATA:  Urinary retention with persistent pelvic pain.  EXAM: RENAL / URINARY TRACT ULTRASOUND COMPLETE  COMPARISON:  CT abdomen and pelvis September 23, 2017  FINDINGS: Right Kidney:  Renal measurements: 11.9 x 6.7 x 7 cm = volume: 291 mL. Echogenicity within normal limits. No mass or hydronephrosis visualized.  Left Kidney:  Renal measurements: 13.4 x 6.7 x 6 cm = volume: 280 mL. Echogenicity within normal limits. No mass or hydronephrosis visualized.  Bladder:  Appears normal for degree of bladder distention. Pre void volume of 176 cc and postvoid volume of 6 cc.  Other:  There is a 7.6 x 6.5 x 6.9 cm hyperechoic mass in the right adrenal gland consistent with patient's known CT noted adrenal myelolipoma.  IMPRESSION: 1. Normal renal ultrasound. 2. 7.6 x 6.5 x 6.9 cm hyperechoic mass in the right adrenal gland consistent with patient's known CT noted adrenal myelolipoma. No follow-up is recommended.   Electronically Signed By: Anna Barnes M.D. On: 10/23/2023 15:50 Personally reviewed the above scan and agree with radiologic interpretation.    Assessment & Plan:    1. Inflammatory Prostatitis - Experiencing symptoms consistent with inflammatory prostatitis, likely exacerbated by poorly  controlled diabetes and potential pelvic floor dysfunction. Continue tamsulosin (Flomax) and initiate meloxicam  (Mobic ) for anti-inflammatory treatment. Monitor for any gastrointestinal side effects. If symptoms do not improve, consider further evaluation and management. -No evidence of infection or retention  2. Poorly Controlled Diabetes - His last HbA1c was 10, indicating poor glycemic control. He has restarted on Mounjaro , which previously helped lower his HbA1c. Encourage adherence to diabetes management and follow up on blood glucose levels.  3. Adrenal Myelolipoma - The adrenal lesion is stable and has been characterized as a myelolipoma. Continue monitoring with no immediate intervention required unless there are changes in size or symptoms.  Return in about 6 weeks (around 12/09/2023).  I have reviewed the above documentation for accuracy and completeness, and I agree with the above.   Dustin Gimenez, MD   [facility] [address], [address] [place], [place] [address] [phone]

## 2023-10-29 DIAGNOSIS — M9902 Segmental and somatic dysfunction of thoracic region: Secondary | ICD-10-CM | POA: Diagnosis not present

## 2023-10-29 DIAGNOSIS — M9903 Segmental and somatic dysfunction of lumbar region: Secondary | ICD-10-CM | POA: Diagnosis not present

## 2023-10-29 DIAGNOSIS — M62830 Muscle spasm of back: Secondary | ICD-10-CM | POA: Diagnosis not present

## 2023-10-29 DIAGNOSIS — M5414 Radiculopathy, thoracic region: Secondary | ICD-10-CM | POA: Diagnosis not present

## 2023-10-31 ENCOUNTER — Telehealth: Payer: Self-pay

## 2023-10-31 NOTE — Telephone Encounter (Signed)
 Copied from CRM [id number]. Topic: Clinical - Medication Prior Auth >> Oct 31, 2023 10:58 AM Felizardo Hotter wrote: Reason for EAV:WUJWJXBJ call from [employer] per [facility] ph: [phone], fax: [phone] regarding prior authorization for tirzepatide  (MOUNJARO ) 5 MG/0.5ML Pen

## 2023-11-03 ENCOUNTER — Other Ambulatory Visit (HOSPITAL_COMMUNITY): Payer: Self-pay

## 2023-11-04 ENCOUNTER — Telehealth: Payer: Self-pay

## 2023-11-04 NOTE — Telephone Encounter (Signed)
 Left message to give us  a callback to inform message below.

## 2023-11-04 NOTE — Telephone Encounter (Signed)
 Copied from CRM [id number]. Topic: Clinical - Medication Prior Auth >> Oct 31, 2023 10:58 AM Felizardo Hotter wrote: Reason for EAV:WUJWJXBJ call from [employer] per [facility] ph: [phone], fax: [phone] regarding prior authorization for tirzepatide  (MOUNJARO ) 5 MG/0.5ML Pen >> Nov 04, 2023 10:32 AM Jorie Newness J wrote: The pt states he did receive a letter from his insurance advising him that the first fill was a courtesy. He states he didn't need te PA at this moment but one be will be needed for his next refill, please advise. Callback # [phone]

## 2023-11-05 ENCOUNTER — Other Ambulatory Visit (HOSPITAL_COMMUNITY): Payer: Self-pay

## 2023-11-12 ENCOUNTER — Other Ambulatory Visit: Payer: Self-pay | Admitting: Family Medicine

## 2023-11-12 DIAGNOSIS — G44019 Episodic cluster headache, not intractable: Secondary | ICD-10-CM

## 2023-11-12 DIAGNOSIS — M5414 Radiculopathy, thoracic region: Secondary | ICD-10-CM | POA: Diagnosis not present

## 2023-11-12 DIAGNOSIS — M62830 Muscle spasm of back: Secondary | ICD-10-CM | POA: Diagnosis not present

## 2023-11-12 DIAGNOSIS — M9902 Segmental and somatic dysfunction of thoracic region: Secondary | ICD-10-CM | POA: Diagnosis not present

## 2023-11-12 DIAGNOSIS — M9903 Segmental and somatic dysfunction of lumbar region: Secondary | ICD-10-CM | POA: Diagnosis not present

## 2023-11-17 ENCOUNTER — Ambulatory Visit: Admitting: Family Medicine

## 2023-11-18 DIAGNOSIS — M9903 Segmental and somatic dysfunction of lumbar region: Secondary | ICD-10-CM | POA: Diagnosis not present

## 2023-11-18 DIAGNOSIS — M9902 Segmental and somatic dysfunction of thoracic region: Secondary | ICD-10-CM | POA: Diagnosis not present

## 2023-11-18 DIAGNOSIS — M62830 Muscle spasm of back: Secondary | ICD-10-CM | POA: Diagnosis not present

## 2023-11-18 DIAGNOSIS — M5414 Radiculopathy, thoracic region: Secondary | ICD-10-CM | POA: Diagnosis not present

## 2023-11-19 ENCOUNTER — Other Ambulatory Visit: Payer: Self-pay | Admitting: Family Medicine

## 2023-11-19 DIAGNOSIS — M545 Low back pain, unspecified: Secondary | ICD-10-CM

## 2023-11-19 NOTE — Telephone Encounter (Signed)
 Copied from CRM [id number]. Topic: Clinical - Medication Refill >> Nov 19, 2023  9:18 AM Carlatta H wrote: Medication: oxyCODONE -acetaminophen  (PERCOCET) 7.5-325 MG tablet [[id number]]  Has the patient contacted their pharmacy? No (Agent: If no, request that the patient contact the pharmacy for the refill. If patient does not wish to contact the pharmacy document the reason why and proceed with request.) (Agent: If yes, when and what did the pharmacy advise?)  This is the patient's preferred pharmacy:    [facility] [address] [facility] - [place], [place] - [address] [place] [address] Phone: [phone] Fax: [phone]    Is this the correct pharmacy for this prescription? Yes If no, delete pharmacy and type the correct one.   Has the prescription been filled recently? No  Is the patient out of the medication? Yes  Has the patient been seen for an appointment in the last year OR does the patient have an upcoming appointment? Yes  Can we respond through MyChart? Yes  Agent: Please be advised that Rx refills may take up to 3 business days. We ask that you follow-up with your pharmacy.

## 2023-11-20 NOTE — Telephone Encounter (Signed)
 Requested medications are due for refill today.  yes  Requested medications are on the active medications list.  yes  Last refill. 10/28/2023 #60 0 rf  Future visit scheduled.   yes  Notes to clinic.  Refill not delegated.    Requested Prescriptions  Pending Prescriptions Disp Refills   oxyCODONE -acetaminophen  (PERCOCET) 7.5-325 MG tablet 60 tablet 0    Sig: Take 1 tablet by mouth every 4 (four) hours as needed.     Not Delegated - Analgesics:  Opioid Agonist Combinations Failed - 11/20/2023 11:17 AM      Failed - This refill cannot be delegated      Failed - Urine Drug Screen completed in last 360 days      Failed - Valid encounter within last 3 months    Recent Outpatient Visits           1 month ago Urinary retention   [facility] Lamon Pillow, MD   2 months ago Urinary tract infection without hematuria, site unspecified   [facility] Lamon Pillow, MD   3 months ago Type 2 diabetes mellitus without complication, without long-term current use of insulin  [facility])   [facility] Lamon Pillow, MD       Future Appointments             In 2 weeks [place] Spitz, Samantha, PA-C [facility] [place]

## 2023-11-23 MED ORDER — OXYCODONE-ACETAMINOPHEN 7.5-325 MG PO TABS: 1.0000 | ORAL_TABLET | ORAL | 0 refills | Status: DC | PRN

## 2023-11-24 DIAGNOSIS — E119 Type 2 diabetes mellitus without complications: Secondary | ICD-10-CM | POA: Diagnosis not present

## 2023-11-24 DIAGNOSIS — R112 Nausea with vomiting, unspecified: Secondary | ICD-10-CM | POA: Diagnosis not present

## 2023-11-26 ENCOUNTER — Encounter: Payer: Self-pay | Admitting: Family Medicine

## 2023-11-26 ENCOUNTER — Telehealth: Payer: Self-pay

## 2023-11-26 ENCOUNTER — Other Ambulatory Visit (HOSPITAL_COMMUNITY): Payer: Self-pay

## 2023-11-26 ENCOUNTER — Ambulatory Visit: Admitting: Family Medicine

## 2023-11-26 VITALS — BP 119/70 | HR 75 | Temp 97.8°F | Ht 73.0 in | Wt 287.5 lb

## 2023-11-26 DIAGNOSIS — Z7985 Long-term (current) use of injectable non-insulin antidiabetic drugs: Secondary | ICD-10-CM | POA: Diagnosis not present

## 2023-11-26 DIAGNOSIS — E119 Type 2 diabetes mellitus without complications: Secondary | ICD-10-CM

## 2023-11-26 DIAGNOSIS — M9902 Segmental and somatic dysfunction of thoracic region: Secondary | ICD-10-CM | POA: Diagnosis not present

## 2023-11-26 DIAGNOSIS — M9903 Segmental and somatic dysfunction of lumbar region: Secondary | ICD-10-CM | POA: Diagnosis not present

## 2023-11-26 DIAGNOSIS — M62830 Muscle spasm of back: Secondary | ICD-10-CM | POA: Diagnosis not present

## 2023-11-26 DIAGNOSIS — M5414 Radiculopathy, thoracic region: Secondary | ICD-10-CM | POA: Diagnosis not present

## 2023-11-26 LAB — POCT GLYCOSYLATED HEMOGLOBIN (HGB A1C): Hemoglobin A1C: 8.3 % — AB (ref 4.0–5.6)

## 2023-11-26 MED ORDER — LANTUS SOLOSTAR 100 UNIT/ML ~~LOC~~ SOPN: 54.0000 [IU] | PEN_INJECTOR | Freq: Every day | SUBCUTANEOUS | 3 refills | Status: DC

## 2023-11-26 MED ORDER — DEXCOM G7 RECEIVER DEVI: 0 refills | Status: DC

## 2023-11-26 MED ORDER — DEXCOM G7 SENSOR MISC: 3 refills | Status: DC

## 2023-11-26 NOTE — Patient Instructions (Signed)
 Marland Kitchen  Please review the attached list of medications and notify my office if there are any errors.   . Please bring all of your medications to every appointment so we can make sure that our medication list is the same as yours.

## 2023-11-26 NOTE — Progress Notes (Signed)
 Established patient visit   Patient: Raymond Rose   DOB: 05-Feb-1976   47 y.o. Male  MRN: [id number] Visit Date: 11/26/2023  Today's healthcare provider: Jeralene Mom, MD   Chief Complaint  Patient presents with   Medical Management of Chronic Issues   Subjective    Discussed the use of AI scribe software for clinical note transcription with the patient, who gave verbal consent to proceed.  History of Present Illness   Raymond Rose is a 48 year old male with diabetes who presents for follow-up on blood sugar management.  He is now on Mounjaro  5mg  and finds it more tolerable than Ozempic . His HbA1c has decreased from 9.3% to 8.3% since the last visit in March. He is actively managing his diet by eating low-carb foods like salads and incorporating exercise into his routine.  He is currently taking 54 units of Basaglar  in the morning and has not experienced any episodes of hypoglycemia. His blood sugar readings range from as low as 177 mg/dL after exercise to as high as 290 mg/dL in the morning. He states his insurance requires a change to Lantus  Solostar  No low blood sugar episodes.     Lab Results  Component Value Date   NA 140 08/11/2023   K 4.6 08/11/2023   CREATININE 1.03 08/11/2023   GFRNONAA >60 08/11/2023   GLUCOSE 203 (H) 08/11/2023   Lab Results  Component Value Date   CHOL 173 04/18/2023   HDL 37 (L) 04/18/2023   LDLCALC 90 04/18/2023   TRIG 274 (H) 04/18/2023   CHOLHDL 4.7 04/18/2023   Wt Readings from Last 10 Encounters:  11/26/23 287 lb 8 oz (130.4 kg)  10/28/23 293 lb (132.9 kg)  10/17/23 289 lb (131.1 kg)  09/16/23 292 lb (132.5 kg)  08/11/23 285 lb (129.3 kg)  08/06/23 296 lb (134.3 kg)  04/18/23 295 lb 6.4 oz (134 kg)  02/26/23 (!) 301 lb (136.5 kg)  01/27/23 (!) 304 lb 3.2 oz (138 kg)  10/11/22 (!) 310 lb (140.6 kg)     Medications: Outpatient Medications Prior to Visit  Medication Sig   alfuzosin  (UROXATRAL ) 10 MG 24 hr  tablet TAKE 1 TABLET (10 MG TOTAL) BY MOUTH DAILY WITH BREAKFAST.   ALPRAZolam  (XANAX ) 0.5 MG tablet TAKE 1 TABLET BY MOUTH EVERY 4 HOURS AS NEEDED FOR ANXIETY   ARIPiprazole (ABILIFY) 2 MG tablet Take 2 mg by mouth daily.   BD PEN NEEDLE NANO 2ND GEN 32G X 4 MM MISC USE AS DIRECTED 4 TIMES A DAY   blood glucose meter kit and supplies KIT Dispense based on patient and insurance preference. Use up to four times daily as directed. (FOR ICD-9 250.00, 250.01).   finasteride  (PROSCAR ) 5 MG tablet TAKE 1 TABLET (5 MG TOTAL) BY MOUTH DAILY.   fluconazole  (DIFLUCAN ) 150 MG tablet TAKE 1 TABLET BY MOUTH AS ONE DOSE   insulin  aspart (NOVOLOG  FLEXPEN) 100 UNIT/ML FlexPen Inject up to 20 units three times daily before meals as directed   lamoTRIgine  (LAMICTAL ) 100 MG tablet TAKE 1.5 TABLETS (150MG  TOTAL) BY MOUTH DAILY (Patient taking differently: No sig reported)   meloxicam  (MOBIC ) 15 MG tablet Take 1 tablet (15 mg total) by mouth daily.   metFORMIN  (GLUCOPHAGE -XR) 500 MG 24 hr tablet TAKE 2 TABLETS BY MOUTH TWICE A DAY   omeprazole  (PRILOSEC) 40 MG capsule TAKE 1 CAPSULE BY MOUTH EVERY DAY   oxyCODONE -acetaminophen  (PERCOCET) 7.5-325 MG tablet Take 1 tablet by mouth  every 4 (four) hours as needed.   pioglitazone  (ACTOS ) 30 MG tablet TAKE 1 TABLET BY MOUTH EVERY DAY   pregabalin  (LYRICA ) 150 MG capsule Take 1 capsule (150 mg total) by mouth 2 (two) times daily.   prochlorperazine  (COMPAZINE ) 10 MG tablet TAKE 1/2 - 1 TABLETS BY MOUTH EVERY 6 (SIX) HOURS AS NEEDED FOR NAUSEA OR VOMITING.   promethazine  (PHENERGAN ) 25 MG tablet Take 25 mg by mouth every 6 (six) hours as needed for nausea or vomiting.   rosuvastatin  (CRESTOR ) 20 MG tablet TAKE 1 TABLET BY MOUTH EVERY DAY   sildenafil  (VIAGRA ) 50 MG tablet Take 1 tablet (50 mg total) by mouth daily as needed for erectile dysfunction.   tirzepatide  (MOUNJARO ) 5 MG/0.5ML Pen Inject 5 mg into the skin once a week.   Vilazodone  HCl (VIIBRYD ) 40 MG TABS Take 1  tablet (40 mg total) by mouth daily.   [DISCONTINUED] Insulin  Glargine (BASAGLAR  KWIKPEN) 100 UNIT/ML INJECT 54 UNITS INTO THE SKIN DAILY.   No facility-administered medications prior to visit.   Review of Systems  Constitutional:  Negative for appetite change, chills and fever.  Respiratory:  Negative for chest tightness, shortness of breath and wheezing.   Cardiovascular:  Negative for chest pain and palpitations.  Gastrointestinal:  Negative for abdominal pain, nausea and vomiting.      Objective    BP 119/70 (BP Location: Left Arm, Patient Position: Sitting, Cuff Size: Normal)   Pulse 75   Temp 97.8 F (36.6 C) (Oral)   Ht 6' 1 (1.854 m)   Wt 287 lb 8 oz (130.4 kg)   SpO2 98%   BMI 37.93 kg/m   Physical Exam   General appearance: Obese male, cooperative and in no acute distress Head: Normocephalic, without obvious abnormality, atraumatic Respiratory: Respirations even and unlabored, normal respiratory rate Extremities: All extremities are intact.  Skin: Skin color, texture, turgor normal. No rashes seen  Psych: Appropriate mood and affect. Neurologic: Mental status: Alert, oriented to person, place, and time, thought content appropriate.   Results for orders placed or performed in visit on 11/26/23  POCT glycosylated hemoglobin (Hb A1C)  Result Value Ref Range   Hemoglobin A1C 8.3 (A) 4.0 - 5.6 %     Assessment & Plan       Type 2 Diabetes Mellitus Suboptimal glycemic control with HbA1c at 8.3%. Improved from 9.3%. On Mounjaro  5 mg and insulin  Basaglar  54 units daily. No hypoglycemia. Insurance requires switch to Lantus . Discussed continuous glucose monitoring. Mounjaro  dose adequate, potential for future increase. - Switch Basaglar  to Lantus  per insurance. - Continue Mounjaro  5 mg, reassess HbA1c in 3 months. - Prescribe Dexcom continuous glucose monitor. - Perform kidney function test with urinalysis for albumin creatinine ratio.  Follow-up Reassess glycemic  control and adjust treatment as necessary. - Schedule follow-up in September to reassess HbA1c and diabetes management.    Return in about 3 months (around 02/26/2024).   Jeralene Mom, MD  [facility] [phone] (phone) [phone] (fax)  [facility]

## 2023-11-26 NOTE — Telephone Encounter (Signed)
 Pharmacy Patient Advocate Encounter   Received notification from Onbase that prior authorization for Dexcom G7 Sensor  is required/requested.   Insurance verification completed.   The patient is insured through [facility] [employer] .   Per test claim: PA required; PA submitted to above mentioned insurance via CoverMyMeds Key/confirmation #/EOC [id number] Status is pending

## 2023-11-27 ENCOUNTER — Other Ambulatory Visit (HOSPITAL_COMMUNITY): Payer: Self-pay

## 2023-11-27 ENCOUNTER — Other Ambulatory Visit: Payer: Self-pay | Admitting: Family Medicine

## 2023-11-27 DIAGNOSIS — E119 Type 2 diabetes mellitus without complications: Secondary | ICD-10-CM

## 2023-11-27 LAB — MICROALBUMIN / CREATININE URINE RATIO
Creatinine, Urine: 138.7 mg/dL
Microalb/Creat Ratio: 26 mg/g{creat} (ref 0–29)
Microalbumin, Urine: 35.6 ug/mL

## 2023-11-27 NOTE — Telephone Encounter (Signed)
 Pharmacy Patient Advocate Encounter  Received notification from [employer] that Prior Authorization for Dexcom G7 Sensor  has been DENIED.  See denial reason below. No denial letter attached in CMM. Will attach denial letter to Media tab once received.   PA #/Case ID/Reference #: [id number]

## 2023-11-28 NOTE — Telephone Encounter (Signed)
 PA request has been Denied. New Encounter has been or will be created for follow up. For additional info see Pharmacy Prior Auth telephone encounter from 11/26/2023.

## 2023-12-02 ENCOUNTER — Telehealth: Payer: Self-pay

## 2023-12-02 NOTE — Telephone Encounter (Signed)
 Copied from CRM [id number]. Topic: Clinical - Prescription Issue >> Nov 28, 2023  1:43 PM Carlatta H wrote: Reason for CRM: Please call patient about Prior Authorization for Dexcom G7 Sensor  has been DENIED.  See denial reason below. No denial letter attached in CMM. Will attach denial letter to Media tab once received.

## 2023-12-05 ENCOUNTER — Ambulatory Visit: Admitting: Physician Assistant

## 2023-12-05 ENCOUNTER — Other Ambulatory Visit: Payer: Self-pay | Admitting: Family Medicine

## 2023-12-05 NOTE — Telephone Encounter (Unsigned)
 Copied from CRM [id number]. Topic: Clinical - Medication Refill >> Dec 05, 2023  4:10 PM Zipporah Him wrote: Medication: insulin  aspart (NOVOLOG  FLEXPEN) 100 UNIT/ML FlexPen  Has the patient contacted their pharmacy? Yes (Agent: If no, request that the patient contact the pharmacy for the refill. If patient does not wish to contact the pharmacy document the reason why and proceed with request.) (Agent: If yes, when and what did the pharmacy advise?)  This is the patient's preferred pharmacy:  [facility] [phone] [facility] [place] - [address] [place] [address] Phone: [phone] Fax: [phone]   Is this the correct pharmacy for this prescription? Yes If no, delete pharmacy and type the correct one.   Has the prescription been filled recently? Yes  Is the patient out of the medication? No, getting close  Has the patient been seen for an appointment in the last year OR does the patient have an upcoming appointment? Yes  Can we respond through MyChart? Yes  Agent: Please be advised that Rx refills may take up to 3 business days. We ask that you follow-up with your pharmacy.

## 2023-12-06 MED ORDER — FREESTYLE LIBRE 3 SENSOR MISC
4 refills | Status: DC
Start: 2023-12-06 — End: 2024-01-28

## 2023-12-06 MED ORDER — FREESTYLE LIBRE 3 READER DEVI: 1 refills | Status: AC

## 2023-12-06 NOTE — Addendum Note (Signed)
 Addended by: GASPER NANCYANN BRAVO on: 12/06/2023 10:45 AM   Modules accepted: Orders

## 2023-12-07 ENCOUNTER — Other Ambulatory Visit: Payer: Self-pay | Admitting: Family Medicine

## 2023-12-07 DIAGNOSIS — E119 Type 2 diabetes mellitus without complications: Secondary | ICD-10-CM

## 2023-12-07 MED ORDER — TIRZEPATIDE 5 MG/0.5ML ~~LOC~~ SOAJ: 5.0000 mg | SUBCUTANEOUS | 2 refills | Status: DC

## 2023-12-08 ENCOUNTER — Other Ambulatory Visit: Payer: Self-pay | Admitting: Family Medicine

## 2023-12-08 DIAGNOSIS — R112 Nausea with vomiting, unspecified: Secondary | ICD-10-CM

## 2023-12-08 DIAGNOSIS — R350 Frequency of micturition: Secondary | ICD-10-CM

## 2023-12-08 DIAGNOSIS — R3911 Hesitancy of micturition: Secondary | ICD-10-CM

## 2023-12-08 DIAGNOSIS — F419 Anxiety disorder, unspecified: Secondary | ICD-10-CM

## 2023-12-09 ENCOUNTER — Other Ambulatory Visit: Payer: Self-pay | Admitting: Family Medicine

## 2023-12-09 DIAGNOSIS — E782 Mixed hyperlipidemia: Secondary | ICD-10-CM

## 2023-12-09 MED ORDER — NOVOLOG FLEXPEN 100 UNIT/ML ~~LOC~~ SOPN: PEN_INJECTOR | SUBCUTANEOUS | 11 refills | Status: DC

## 2023-12-09 NOTE — Telephone Encounter (Signed)
 Requested medication (s) are due for refill today: yes  Requested medication (s) are on the active medication list: yes  Last refill:  07/10/22 15 ml 11 RF  Future visit scheduled: yes  Notes to clinic:  abnormal HgbA1C   Requested Prescriptions  Pending Prescriptions Disp Refills   insulin  aspart (NOVOLOG  FLEXPEN) 100 UNIT/ML FlexPen 15 mL 11    Sig: Inject up to 20 units three times daily before meals as directed     Endocrinology:  Diabetes - Insulins Failed - 12/09/2023 10:40 AM      Failed - HBA1C is between 0 and 7.9 and within 180 days    Hemoglobin A1C  Date Value Ref Range Status  11/26/2023 8.3 (A) 4.0 - 5.6 % Final   Hgb A1c MFr Bld  Date Value Ref Range Status  04/18/2023 8.5 (H) 4.8 - 5.6 % Final    Comment:             Prediabetes: 5.7 - 6.4          Diabetes: >6.4          Glycemic control for adults with diabetes: <7.0          Passed - Valid encounter within last 6 months    Recent Outpatient Visits           1 week ago Type 2 diabetes mellitus without complication, without long-term current use of insulin  (HCC)   [facility] Gasper Nancyann BRAVO, MD   1 month ago Urinary retention   [facility] Gasper Nancyann BRAVO, MD   2 months ago Urinary tract infection without hematuria, site unspecified   [facility] Gasper Nancyann BRAVO, MD   4 months ago Type 2 diabetes mellitus without complication, without long-term current use of insulin  [facility])   [facility] Gasper, Nancyann BRAVO, MD       Future Appointments             In 3 days Maurine Lucie RIGGERS [facility] [place]

## 2023-12-11 ENCOUNTER — Telehealth: Payer: Self-pay | Admitting: Family Medicine

## 2023-12-11 DIAGNOSIS — M9903 Segmental and somatic dysfunction of lumbar region: Secondary | ICD-10-CM | POA: Diagnosis not present

## 2023-12-11 DIAGNOSIS — M62830 Muscle spasm of back: Secondary | ICD-10-CM | POA: Diagnosis not present

## 2023-12-11 DIAGNOSIS — M9902 Segmental and somatic dysfunction of thoracic region: Secondary | ICD-10-CM | POA: Diagnosis not present

## 2023-12-11 DIAGNOSIS — G8929 Other chronic pain: Secondary | ICD-10-CM

## 2023-12-11 DIAGNOSIS — M5414 Radiculopathy, thoracic region: Secondary | ICD-10-CM | POA: Diagnosis not present

## 2023-12-11 NOTE — Telephone Encounter (Signed)
 Copied from CRM [id number]. Topic: Clinical - Medication Refill >> Dec 11, 2023  9:05 AM Marissa P wrote: Medication:  oxyCODONE -acetaminophen  (PERCOCET) 7.5-325 MG tablet  Has the patient contacted their pharmacy? Yes (Agent: If no, request that the patient contact the pharmacy for the refill. If patient does not wish to contact the pharmacy document the reason why and proceed with request.) (Agent: If yes, when and what did the pharmacy advise?)  This is the patient's preferred pharmacy:  [id number] [place] [facility] [address] [place] [address] Phone: [phone] Fax: [phone]   Is this the correct pharmacy for this prescription? Yes If no, delete pharmacy and type the correct one.   Has the prescription been filled recently? Yes  Is the patient out of the medication? No  Has the patient been seen for an appointment in the last year OR does the patient have an upcoming appointment? No  Can we respond through MyChart? Yes  Agent: Please be advised that Rx refills may take up to 3 business days. We ask that you follow-up with your pharmacy.

## 2023-12-11 NOTE — Telephone Encounter (Signed)
 Requested medication (s) are due for refill today: Yes  Requested medication (s) are on the active medication list: Yes  Last refill:  11/23/23  Future visit scheduled: Yes  Notes to clinic:  Unable to refill per protocol, cannot delegate.      Requested Prescriptions  Pending Prescriptions Disp Refills   oxyCODONE -acetaminophen  (PERCOCET) 7.5-325 MG tablet 60 tablet 0    Sig: Take 1 tablet by mouth every 4 (four) hours as needed.     Not Delegated - Analgesics:  Opioid Agonist Combinations Failed - 12/11/2023  3:56 PM      Failed - This refill cannot be delegated      Failed - Urine Drug Screen completed in last 360 days      Failed - Valid encounter within last 3 months    Recent Outpatient Visits           2 weeks ago Type 2 diabetes mellitus without complication, without long-term current use of insulin  (HCC)   [facility] Nancyann BRAVO, MD   1 month ago Urinary retention   [facility] Nancyann BRAVO, [facility]   2 months ago Urinary tract infection without hematuria, site unspecified   [facility] Gasper Nancyann BRAVO, MD   4 months ago Type 2 diabetes mellitus without complication, without long-term current use of insulin  [facility])   [facility] Nancyann BRAVO, MD

## 2023-12-12 ENCOUNTER — Ambulatory Visit: Admitting: Physician Assistant

## 2023-12-12 ENCOUNTER — Other Ambulatory Visit: Payer: Self-pay

## 2023-12-12 ENCOUNTER — Telehealth: Payer: Self-pay | Admitting: Family Medicine

## 2023-12-12 ENCOUNTER — Other Ambulatory Visit: Payer: Self-pay | Admitting: Family Medicine

## 2023-12-12 DIAGNOSIS — E1165 Type 2 diabetes mellitus with hyperglycemia: Secondary | ICD-10-CM

## 2023-12-12 DIAGNOSIS — M545 Low back pain, unspecified: Secondary | ICD-10-CM

## 2023-12-12 MED ORDER — OXYCODONE-ACETAMINOPHEN 7.5-325 MG PO TABS: 1.0000 | ORAL_TABLET | ORAL | 0 refills | Status: DC | PRN

## 2023-12-12 NOTE — Telephone Encounter (Signed)
Converted to refill request 

## 2023-12-12 NOTE — Telephone Encounter (Signed)
 [facility] oxyCODONE -acetaminophen  (PERCOCET) 7.5-325 MG tablet  Please advise

## 2023-12-13 ENCOUNTER — Other Ambulatory Visit: Payer: Self-pay | Admitting: Urology

## 2023-12-13 DIAGNOSIS — N418 Other inflammatory diseases of prostate: Secondary | ICD-10-CM

## 2023-12-13 DIAGNOSIS — R339 Retention of urine, unspecified: Secondary | ICD-10-CM

## 2023-12-17 ENCOUNTER — Other Ambulatory Visit: Payer: Self-pay | Admitting: Family Medicine

## 2023-12-17 ENCOUNTER — Telehealth: Payer: Self-pay

## 2023-12-17 DIAGNOSIS — M545 Low back pain, unspecified: Secondary | ICD-10-CM

## 2023-12-17 NOTE — Telephone Encounter (Signed)
 Copied from CRM [id number]. Topic: Clinical - Prescription Issue >> Dec 17, 2023 12:11 PM Antwanette L wrote: Reason for CRM: [facility] is calling in about an order Dr. Gasper sent in. The order was for oxyCODONE -acetaminophen  (PERCOCET) 7.5-325 MG tablet. The order is for 60 tablets/ 10 day supply. According to Marivi, the insurance provider will only cover a 7 day supply. Anything over 7 days, requires a prior authorization. Mannie is requesting a call back at [phone] ext [phone]

## 2023-12-17 NOTE — Telephone Encounter (Signed)
 [facility] oxyCODONE -acetaminophen  (PERCOCET) 7.5-325 MG tablet  Please advise

## 2023-12-22 MED ORDER — OXYCODONE-ACETAMINOPHEN 7.5-325 MG PO TABS: 1.0000 | ORAL_TABLET | ORAL | 0 refills | Status: DC | PRN

## 2023-12-25 ENCOUNTER — Ambulatory Visit: Admitting: Physician Assistant

## 2023-12-25 DIAGNOSIS — R339 Retention of urine, unspecified: Secondary | ICD-10-CM

## 2023-12-29 DIAGNOSIS — M5414 Radiculopathy, thoracic region: Secondary | ICD-10-CM | POA: Diagnosis not present

## 2023-12-29 DIAGNOSIS — M62830 Muscle spasm of back: Secondary | ICD-10-CM | POA: Diagnosis not present

## 2023-12-29 DIAGNOSIS — M9902 Segmental and somatic dysfunction of thoracic region: Secondary | ICD-10-CM | POA: Diagnosis not present

## 2023-12-29 DIAGNOSIS — M9903 Segmental and somatic dysfunction of lumbar region: Secondary | ICD-10-CM | POA: Diagnosis not present

## 2024-01-02 ENCOUNTER — Other Ambulatory Visit: Payer: Self-pay | Admitting: Family Medicine

## 2024-01-02 DIAGNOSIS — E1110 Type 2 diabetes mellitus with ketoacidosis without coma: Secondary | ICD-10-CM

## 2024-01-07 ENCOUNTER — Ambulatory Visit: Admitting: Physician Assistant

## 2024-01-08 ENCOUNTER — Ambulatory Visit: Admitting: Physician Assistant

## 2024-01-12 ENCOUNTER — Other Ambulatory Visit: Payer: Self-pay | Admitting: Family Medicine

## 2024-01-12 DIAGNOSIS — M9902 Segmental and somatic dysfunction of thoracic region: Secondary | ICD-10-CM | POA: Diagnosis not present

## 2024-01-12 DIAGNOSIS — M5414 Radiculopathy, thoracic region: Secondary | ICD-10-CM | POA: Diagnosis not present

## 2024-01-12 DIAGNOSIS — M545 Low back pain, unspecified: Secondary | ICD-10-CM

## 2024-01-12 DIAGNOSIS — M62830 Muscle spasm of back: Secondary | ICD-10-CM | POA: Diagnosis not present

## 2024-01-12 DIAGNOSIS — M9903 Segmental and somatic dysfunction of lumbar region: Secondary | ICD-10-CM | POA: Diagnosis not present

## 2024-01-12 NOTE — Telephone Encounter (Unsigned)
 Copied from CRM [id number]. Topic: Clinical - Medication Refill >> Jan 12, 2024  8:53 AM Janeecia G wrote: Medication: oxycodone   Has the patient contacted their pharmacy? No (Agent: If no, request that the patient contact the pharmacy for the refill. If patient does not wish to contact the pharmacy document the reason why and proceed with request.) (Agent: If yes, when and what did the pharmacy advise?)  This is the patient's preferred pharmacy:  [phone] [place] - [address] [place] [address] Phone: [phone] Fax: [phone]  Is this the correct pharmacy for this prescription? Yes If no, delete pharmacy and type the correct one.   Has the prescription been filled recently? No  Is the patient out of the medication? Yes  Has the patient been seen for an appointment in the last year OR does the patient have an upcoming appointment? Yes  Can we respond through MyChart? Yes  Agent: Please be advised that Rx refills may take up to 3 business days. We ask that you follow-up with your pharmacy.

## 2024-01-13 NOTE — Telephone Encounter (Signed)
 Requested medication (s) are due for refill today: yes  Requested medication (s) are on the active medication list: yes  Last refill:  12/22/23 #60  Future visit scheduled: yes  Notes to clinic:  med not delegated to NT to RF   Requested Prescriptions  Pending Prescriptions Disp Refills   oxyCODONE -acetaminophen  (PERCOCET) 7.5-325 MG tablet 60 tablet 0    Sig: Take 1 tablet by mouth every 4 (four) hours as needed.     Not Delegated - Analgesics:  Opioid Agonist Combinations Failed - 01/13/2024  1:48 PM      Failed - This refill cannot be delegated      Failed - Urine Drug Screen completed in last 360 days      Passed - Valid encounter within last 3 months    Recent Outpatient Visits           1 month ago Type 2 diabetes mellitus without complication, without long-term current use of insulin  (HCC)   [facility] Nancyann BRAVO, MD   2 months ago Urinary retention   [facility] Gas[facility] Nancyann BRAVO, MD   3 months ago Urinary tract infection without hematuria, site unspecified   [facility] Gas[facility] Nancyann BRAVO, MD   5 months ago Type 2 diabetes mellitus without complication, without long-term current use of insulin  [facility])   [facility] Gasper, Nancyann BRAVO, MD

## 2024-01-14 ENCOUNTER — Ambulatory Visit (INDEPENDENT_AMBULATORY_CARE_PROVIDER_SITE_OTHER): Admitting: Family Medicine

## 2024-01-14 ENCOUNTER — Encounter: Payer: Self-pay | Admitting: Family Medicine

## 2024-01-14 DIAGNOSIS — F411 Generalized anxiety disorder: Secondary | ICD-10-CM | POA: Diagnosis not present

## 2024-01-14 DIAGNOSIS — Z79899 Other long term (current) drug therapy: Secondary | ICD-10-CM | POA: Diagnosis not present

## 2024-01-14 DIAGNOSIS — E119 Type 2 diabetes mellitus without complications: Secondary | ICD-10-CM | POA: Diagnosis not present

## 2024-01-14 DIAGNOSIS — Z7984 Long term (current) use of oral hypoglycemic drugs: Secondary | ICD-10-CM | POA: Diagnosis not present

## 2024-01-14 MED ORDER — VILAZODONE HCL 40 MG PO TABS: 40.0000 mg | ORAL_TABLET | Freq: Every day | ORAL | 2 refills | Status: AC

## 2024-01-14 MED ORDER — ARIPIPRAZOLE 2 MG PO TABS: 2.0000 mg | ORAL_TABLET | Freq: Every day | ORAL | 2 refills | Status: DC

## 2024-01-14 MED ORDER — LANTUS SOLOSTAR 100 UNIT/ML ~~LOC~~ SOPN: 26.0000 [IU] | PEN_INJECTOR | Freq: Every day | SUBCUTANEOUS | Status: DC

## 2024-01-14 MED ORDER — LAMOTRIGINE 200 MG PO TABS: 400.0000 mg | ORAL_TABLET | Freq: Every day | ORAL | 2 refills | Status: AC

## 2024-01-14 MED ORDER — OXYCODONE-ACETAMINOPHEN 7.5-325 MG PO TABS: 1.0000 | ORAL_TABLET | ORAL | 0 refills | Status: DC | PRN

## 2024-01-14 NOTE — Progress Notes (Signed)
 Established patient visit   Patient: Raymond Rose   DOB: [birth year]   48 y.o. Male  MRN: [id number] Visit Date: 01/14/2024  Today's healthcare provider: Nancyann Perry, MD   Chief Complaint  Patient presents with   Medical Management of Chronic Issues    Patient says psychiatrist is currently out of network with insurance and asked if you are able to refill until he sees new provider.    Blood Sugar Problem    Patient reports high and low readings, states he woke up last night and it read 48 on CGM and was sweating. Patient has cut back on insulin  from 3x day to 1x per day- still getting low readings.  Needs new rx for CGM- libre 3 plus    Subjective    Discussed the use of AI scribe software for clinical note transcription with the patient, who gave verbal consent to proceed.  History of Present Illness   He is seeking assistance with medication management as his psychiatrist is now out of network. He requires refills for his antidepressants until he can find a new psychiatrist. He has been on Abilify , Viibryd , and lamotrigine , with the most recent addition being Abilify  six months ago, which has helped with his depression. He is currently taking Abilify  2 mg daily, Viibryd  40 mg daily, and lamotrigine  200 mg x 2 daily.  He also reports that he has had low blood sugar levels since titrating up on mounjaro , which he has otherwise been tolerating well. He reports glucose readings dropping into the 50s daily since he started wearing a glucose monitor. Last night, his blood sugar dropped to 48, causing him to wake up sweaty and feeling unwell until he consumed candy and juice. His average glucose has been around 90-95, and his fasting sugars are approximately 100. He is currently taking Lantus  54 units daily, Novolog  20 units once daily,  He has reduced his Novolog  intake due to the low blood sugar episodes.  He has been actively managing his weight, having lost 175 pounds from  his heaviest weight, and is aiming to lose an additional 25 pounds. His current weight is 265 pounds.     Lab Results  Component Value Date   HGBA1C 8.3 (A) 11/26/2023     Medications: Outpatient Medications Prior to Visit  Medication Sig   alfuzosin  (UROXATRAL ) 10 MG 24 hr tablet TAKE 1 TABLET (10 MG TOTAL) BY MOUTH DAILY WITH BREAKFAST.   ALPRAZolam  (XANAX ) 0.5 MG tablet TAKE 1 TABLET BY MOUTH EVERY 4 HOURS AS NEEDED FOR ANXIETY   blood glucose meter kit and supplies KIT Dispense based on patient and insurance preference. Use up to four times daily as directed. (FOR ICD-9 250.00, 250.01).   Continuous Glucose Receiver (FREESTYLE LIBRE 3 READER) DEVI Use to check sugar up to four times a day as needed for uncontrolled insulin  dependent type 2 diabetes.   Continuous Glucose Sensor (DEXCOM G7 SENSOR) MISC Use to check blood sugar as needed for insulin  dependent type 2 diabetes. Change every 10 days.   Continuous Glucose Sensor (FREESTYLE LIBRE 3 SENSOR) MISC Use to check sugar up to four times a day as needed for uncontrolled insulin  dependent type 2 diabetes. Replace sensor every 14 days.   EMBECTA PEN NEEDLE NANO 2 GEN 32G X 4 MM MISC USE AS DIRECTED 4 TIMES A DAY   finasteride  (PROSCAR ) 5 MG tablet TAKE 1 TABLET (5 MG TOTAL) BY MOUTH DAILY.   fluconazole  (DIFLUCAN ) 150 MG  tablet TAKE 1 TABLET BY MOUTH AS ONE DOSE   insulin  aspart (NOVOLOG  FLEXPEN) 100 UNIT/ML FlexPen Inject up to 20 units three times daily before meals as directed   meloxicam  (MOBIC ) 15 MG tablet Take 1 tablet (15 mg total) by mouth daily.   metFORMIN  (GLUCOPHAGE -XR) 500 MG 24 hr tablet TAKE 2 TABLETS BY MOUTH TWICE A DAY   omeprazole  (PRILOSEC) 40 MG capsule TAKE 1 CAPSULE BY MOUTH EVERY DAY   oxyCODONE -acetaminophen  (PERCOCET) 7.5-325 MG tablet Take 1 tablet by mouth every 4 (four) hours as needed.   pioglitazone  (ACTOS ) 30 MG tablet TAKE 1 TABLET BY MOUTH EVERY DAY   pregabalin  (LYRICA ) 150 MG capsule Take 1 capsule  (150 mg total) by mouth 2 (two) times daily.   prochlorperazine  (COMPAZINE ) 10 MG tablet TAKE 1/2 - 1 TABLETS BY MOUTH EVERY 6 (SIX) HOURS AS NEEDED FOR NAUSEA OR VOMITING.   promethazine  (PHENERGAN ) 25 MG tablet Take 25 mg by mouth every 6 (six) hours as needed for nausea or vomiting.   rosuvastatin  (CRESTOR ) 20 MG tablet TAKE 1 TABLET BY MOUTH EVERY DAY   sildenafil  (VIAGRA ) 50 MG tablet Take 1 tablet (50 mg total) by mouth daily as needed for erectile dysfunction.   tirzepatide  (MOUNJARO ) 5 MG/0.5ML Pen Inject 5 mg into the skin once a week.   [DISCONTINUED] ARIPiprazole  (ABILIFY ) 2 MG tablet Take 2 mg by mouth daily.   lamoTRIgine  (LAMICTAL ) 200 MG tablet TAKE 2 TABLETS (150MG  TOTAL) BY MOUTH DAILY    LANTUS  SOLOSTAR 100 UNIT/ML Solostar Pen Inject 54 Units into the skin daily.   Vilazodone  HCl (VIIBRYD ) 40 MG TABS Take 1 tablet (40 mg total) by mouth daily.   No facility-administered medications prior to visit.   Review of Systems     Objective    BP 122/80 (BP Location: Left Arm, Patient Position: Sitting, Cuff Size: Normal)   Pulse 89   Temp 98.2 F (36.8 C) (Oral)   Ht 6' 1 (1.854 m)   Wt 264 lb 11.2 oz (120.1 kg)   SpO2 99%   BMI 34.92 kg/m   Physical Exam   General appearance: Obese male, cooperative and in no acute distress Head: Normocephalic, without obvious abnormality, atraumatic Respiratory: Respirations even and unlabored, normal respiratory rate Extremities: All extremities are intact.  Skin: Skin color, texture, turgor normal. No rashes seen  Psych: Appropriate mood and affect. Neurologic: Mental status: Alert, oriented to person, place, and time, thought content appropriate.   Assessment & Plan        Type 2 diabetes mellitus on insulin  and oral agents with hypoglycemia due to diabetes medications Frequent hypoglycemia likely due to excessive insulin  dosing. Current regimen may need adjustment to prevent low blood glucose levels. - Reduce Lantus  dose  from 54 units to 26 units daily. - Monitor fasting blood glucose levels daily, aiming for 120-130 mg/dL. - Adjust Lantus  dose if fasting glucose levels rise near 200 mg/dL. - Continue current regimen of Novolog , Mounjaro , and oral medications. - Follow up in one September to check A1c .  Major depressive disorder on pharmacotherapy Managing psychiatrist no longer in network. Has appointment scheduled with therapist at [facility] and anticipate referral to new psychiatrist.  Current regiment working well. Sent in refills for current doses of abilify , Viibryd  40 mg daily, and lamotrigine  200 mg twice daily until he establishes care with a new psychiatrist. - Provide a referral to a new psychiatrist, avoiding the previous provider at [facility].  Nancyann Perry, MD  [facility] [phone] (phone) [phone] (fax)  [facility]

## 2024-01-14 NOTE — Patient Instructions (Signed)
 Marland Kitchen  Please review the attached list of medications and notify my office if there are any errors.   . Please bring all of your medications to every appointment so we can make sure that our medication list is the same as yours.

## 2024-01-22 DIAGNOSIS — Z6834 Body mass index (BMI) 34.0-34.9, adult: Secondary | ICD-10-CM | POA: Diagnosis not present

## 2024-01-22 DIAGNOSIS — R3989 Other symptoms and signs involving the genitourinary system: Secondary | ICD-10-CM | POA: Diagnosis not present

## 2024-01-22 DIAGNOSIS — N4883 Acquired buried penis: Secondary | ICD-10-CM | POA: Diagnosis not present

## 2024-01-22 DIAGNOSIS — N528 Other male erectile dysfunction: Secondary | ICD-10-CM | POA: Diagnosis not present

## 2024-01-22 DIAGNOSIS — E66811 Obesity, class 1: Secondary | ICD-10-CM | POA: Diagnosis not present

## 2024-01-26 ENCOUNTER — Ambulatory Visit: Admitting: Physician Assistant

## 2024-01-28 ENCOUNTER — Telehealth: Payer: Self-pay

## 2024-01-28 DIAGNOSIS — E119 Type 2 diabetes mellitus without complications: Secondary | ICD-10-CM

## 2024-01-28 MED ORDER — FREESTYLE LIBRE 3 PLUS SENSOR MISC: 3 refills | Status: AC

## 2024-01-28 NOTE — Telephone Encounter (Signed)
 Copied from CRM #[id number]. Topic: Clinical - Prescription Issue >> Jan 28, 2024  1:17 PM Deaijah H wrote: Reason for CRM: Patient called in to advise Dr. Gasper that Continuous Glucose Sensor (FREESTYLE LIBRE 3 SENSOR) MISC is being discontinued and changed to 3 plus and [facility]

## 2024-01-30 ENCOUNTER — Ambulatory Visit: Admitting: Clinical

## 2024-02-03 DIAGNOSIS — M9902 Segmental and somatic dysfunction of thoracic region: Secondary | ICD-10-CM | POA: Diagnosis not present

## 2024-02-03 DIAGNOSIS — M5414 Radiculopathy, thoracic region: Secondary | ICD-10-CM | POA: Diagnosis not present

## 2024-02-03 DIAGNOSIS — M62830 Muscle spasm of back: Secondary | ICD-10-CM | POA: Diagnosis not present

## 2024-02-03 DIAGNOSIS — M9903 Segmental and somatic dysfunction of lumbar region: Secondary | ICD-10-CM | POA: Diagnosis not present

## 2024-02-08 ENCOUNTER — Other Ambulatory Visit: Payer: Self-pay | Admitting: Family Medicine

## 2024-02-08 DIAGNOSIS — R112 Nausea with vomiting, unspecified: Secondary | ICD-10-CM

## 2024-02-09 ENCOUNTER — Other Ambulatory Visit: Payer: Self-pay | Admitting: Family Medicine

## 2024-02-09 DIAGNOSIS — G8929 Other chronic pain: Secondary | ICD-10-CM

## 2024-02-09 NOTE — Telephone Encounter (Signed)
 Copied from CRM [id number]. Topic: Clinical - Medication Refill >> Feb 09, 2024  8:50 AM Tobias L wrote: Medication: oxyCODONE -acetaminophen  (PERCOCET) 7.5-325 MG tablet Patient completely out, requesting refill to pharmacy below.   Has the patient contacted their pharmacy? Yes Patient reached out on Thursday of last week to pharmacy and has not heard anything back  This is the patient's preferred pharmacy:  [id number] [place] - [address] [place] [address] Phone: [phone] Fax: [phone]   Is this the correct pharmacy for this prescription? Yes  Has the prescription been filled recently? No  Is the patient out of the medication? Yes  Has the patient been seen for an appointment in the last year OR does the patient have an upcoming appointment? Yes  Can we respond through MyChart? No  Agent: Please be advised that Rx refills may take up to 3 business days. We ask that you follow-up with your pharmacy.

## 2024-02-10 NOTE — Telephone Encounter (Signed)
 Requested medication (s) are due for refill today: yes  Requested medication (s) are on the active medication list: yes  Last refill:  12/09/23 #90 2 RF  Future visit scheduled: yes  Notes to clinic:  med not delegated to NT to RF   Requested Prescriptions  Pending Prescriptions Disp Refills   prochlorperazine  (COMPAZINE ) 10 MG tablet [Pharmacy Med Name: PROCHLORPERAZINE  10 MG TAB] 90 tablet 2    Sig: TAKE 1/2 - 1 TABLETS BY MOUTH EVERY 6 (SIX) HOURS AS NEEDED FOR NAUSEA OR VOMITING.     Not Delegated - Gastroenterology: Antiemetics Failed - 02/10/2024  9:59 AM      Failed - This refill cannot be delegated      Passed - Valid encounter within last 6 months    Recent Outpatient Visits           3 weeks ago High risk medication use   [facility] Nancyann BRAVO, [facility]   2 months ago Type 2 diabetes mellitus without complication, without long-term current use of insulin  [facility])   [facility] Nancyann BRAVO, MD   3 months ago Urinary retention   [facility] Nancyann BRAVO, MD   4 months ago Urinary tract infection without hematuria, site unspecified   [facility] Gas[facility] Nancyann BRAVO, MD   6 months ago Type 2 diabetes mellitus without complication, without long-term current use of insulin  [facility])   [facility] Gas[facility] Nancyann BRAVO, MD

## 2024-02-10 NOTE — Telephone Encounter (Signed)
 Requested medication (s) are due for refill today: yes  Requested medication (s) are on the active medication list: yes  Last refill:  01/14/24  Future visit scheduled: {Yes  Notes to clinic:  Unable to refill per protocol, cannot delegate.      Requested Prescriptions  Pending Prescriptions Disp Refills   oxyCODONE -acetaminophen  (PERCOCET) 7.5-325 MG tablet 60 tablet 0    Sig: Take 1 tablet by mouth every 4 (four) hours as needed.     Not Delegated - Analgesics:  Opioid Agonist Combinations Failed - 02/10/2024  1:42 PM      Failed - This refill cannot be delegated      Failed - Urine Drug Screen completed in last 360 days      Passed - Valid encounter within last 3 months    Recent Outpatient Visits           3 weeks ago High risk medication use   [facility] Nancyann BRAVO, MD   2 months ago Type 2 diabetes mellitus without complication, without long-term current use of insulin  [facility])   [facility] Gas[facility] Nancyann BRAVO, MD   3 months ago Urinary retention   [facility] Gasper Nancyann BRAVO, MD   4 months ago Urinary tract infection without hematuria, site unspecified   [facility] Gasper Nancyann BRAVO, MD   6 months ago Type 2 diabetes mellitus without complication, without long-term current use of insulin  [facility])   [facility] Gas[facility] Nancyann BRAVO, MD

## 2024-02-11 MED ORDER — OXYCODONE-ACETAMINOPHEN 7.5-325 MG PO TABS
1.0000 | ORAL_TABLET | ORAL | 0 refills | Status: DC | PRN
Start: 2024-02-11 — End: 2024-02-25

## 2024-02-17 DIAGNOSIS — M62830 Muscle spasm of back: Secondary | ICD-10-CM | POA: Diagnosis not present

## 2024-02-17 DIAGNOSIS — M9902 Segmental and somatic dysfunction of thoracic region: Secondary | ICD-10-CM | POA: Diagnosis not present

## 2024-02-17 DIAGNOSIS — M9903 Segmental and somatic dysfunction of lumbar region: Secondary | ICD-10-CM | POA: Diagnosis not present

## 2024-02-17 DIAGNOSIS — M5414 Radiculopathy, thoracic region: Secondary | ICD-10-CM | POA: Diagnosis not present

## 2024-02-18 ENCOUNTER — Other Ambulatory Visit: Payer: Self-pay | Admitting: Family Medicine

## 2024-02-18 ENCOUNTER — Ambulatory Visit (INDEPENDENT_AMBULATORY_CARE_PROVIDER_SITE_OTHER): Admitting: Clinical

## 2024-02-18 DIAGNOSIS — K219 Gastro-esophageal reflux disease without esophagitis: Secondary | ICD-10-CM

## 2024-02-18 DIAGNOSIS — F333 Major depressive disorder, recurrent, severe with psychotic symptoms: Secondary | ICD-10-CM

## 2024-02-18 DIAGNOSIS — F411 Generalized anxiety disorder: Secondary | ICD-10-CM | POA: Diagnosis not present
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
## 2024-02-18 NOTE — Progress Notes (Signed)
   Darice Seats, LCSW

## 2024-02-24 ENCOUNTER — Telehealth: Payer: Self-pay

## 2024-02-24 ENCOUNTER — Ambulatory Visit: Attending: Cardiovascular Disease | Admitting: Cardiovascular Disease

## 2024-02-24 ENCOUNTER — Encounter: Payer: Self-pay | Admitting: Cardiovascular Disease

## 2024-02-24 VITALS — BP 100/60 | HR 70 | Ht 73.0 in | Wt 273.2 lb

## 2024-02-24 DIAGNOSIS — R0789 Other chest pain: Secondary | ICD-10-CM

## 2024-02-24 NOTE — Patient Instructions (Addendum)
 Medication Instructions:  No changes  If you need a refill on your cardiac medications before your next appointment, please call your pharmacy.   Lab work: No new labs needed  Testing/Procedures: CT coronary calcium  score.   - $99 out of pocket cost at the time of your test - Call [phone] to schedule at your convenience.  [facility] [address] [place], [place] [address]   Coronary CalciumScan A coronary calcium  scan is an imaging test used to look for deposits of calcium  and other fatty materials (plaques) in the inner lining of the blood vessels of the heart (coronary arteries). These deposits of calcium  and plaques can partly clog and narrow the coronary arteries without producing any symptoms or warning signs. This puts a person at risk for a heart attack. This test can detect these deposits before symptoms develop. Tell a health care provider about: Any allergies you have. All medicines you are taking, including vitamins, herbs, eye drops, creams, and over-the-counter medicines. Any problems you or family members have had with anesthetic medicines. Any blood disorders you have. Any surgeries you have had. Any medical conditions you have. Whether you are pregnant or may be pregnant. What are the risks? Generally, this is a safe procedure. However, problems may occur, including: Harm to a pregnant woman and her unborn baby. This test involves the use of radiation. Radiation exposure can be dangerous to a pregnant woman and her unborn baby. If you are pregnant, you generally should not have this procedure done. Slight increase in the risk of cancer. This is because of the radiation involved in the test. What happens before the procedure? No preparation is needed for this procedure. What happens during the procedure? You will undress and remove any jewelry around your neck or chest. You will put on a hospital gown. Sticky  electrodes will be placed on your chest. The electrodes will be connected to an electrocardiogram (ECG) machine to record a tracing of the electrical activity of your heart. A CT scanner will take pictures of your heart. During this time, you will be asked to lie still and hold your breath for 2-3 seconds while a picture of your heart is being taken. The procedure may vary among health care providers and hospitals. What happens after the procedure? You can get dressed. You can return to your normal activities. It is up to you to get the results of your test. Ask your health care provider, or the department that is doing the test, when your results will be ready. Summary A coronary calcium  scan is an imaging test used to look for deposits of calcium  and other fatty materials (plaques) in the inner lining of the blood vessels of the heart (coronary arteries). Generally, this is a safe procedure. Tell your health care provider if you are pregnant or may be pregnant. No preparation is needed for this procedure. A CT scanner will take pictures of your heart. You can return to your normal activities after the scan is done. This information is not intended to replace advice given to you by your health care provider. Make sure you discuss any questions you have with your health care provider. Document Released: 11/30/2007 Document Revised: 04/22/2016 Document Reviewed: 04/22/2016 Elsevier Interactive Patient Education  2017 [employer].   Follow-Up: At [facility], you and your health needs are our priority.  As part of our continuing mission to provide you with exceptional heart care, we have created designated Provider  Care Teams.  These Care Teams include your primary Cardiologist (physician) and Advanced Practice Providers (APPs -  Physician Assistants and Nurse Practitioners) who all work together to provide you with the care you need, when you need it.  You will need a follow up appointment in  12 months  Providers on your designated Care Team:   Lonni Meager, NP Bernardino Bring, PA-C Cadence [place], [place]  COVID-19 Vaccine Information can be found at: [email or web address] For questions related to vaccine distribution or appointments, please email vaccine@[email or web address] or call [phone].

## 2024-02-24 NOTE — Telephone Encounter (Signed)
 Patient called stating he has been experiencing chest pressure over the past few weeks. He stated the symptom is constant and that he is unable to determine any precipitating or alleviating factors. The patient also reported feeling fatigued over the last few days.  Based on the current symptoms, the nurse recommended the patient go to the ER for further evaluation. However, the patient refused and stated he would prefer to have an appointment. An appointment was scheduled for today, 02/24/24. The nurse reiterated the importance of going to the ER. The patient again refused

## 2024-02-24 NOTE — Progress Notes (Signed)
 Cardiology Office Note  Date:  02/24/2024   ID:  Raymond Rose, DOB Jul 21, 1975, MRN [id number]  PCP:  Gasper Nancyann BRAVO, MD   Chief Complaint  Patient presents with   Chest Pain    Patient c/o mid-sternum pain in chest/chest tightness/pressure.    This HPI:  Raymond Rose is a 48 year old male with past medical history of Diabetes Morbid obesity Nausea vomiting on disability Depression Former smoker, stopped at age 37 On disability for cyclical nausea vomiting Who presents for follow-up of his family history of cardiac disease, chest pain  On discussion today, he reports doing well Occasional chest discomfort Sometimes in the upper epigastric area  Weight continues to trend downward Weight 475 down to 440,  down to 304, now down to 273 Vomiting slowing calorie intake  strong family history of cardiac disease Both father and his brother with premature coronary disease in their early 97s Both smokers  No regular exercise program  Lab work reviewed 8.3, now with monitor Total cholesterol 173, LDL 90  EKG personally reviewed by myself on todays visit EKG Interpretation Date/Time:  Tuesday February 24 2024 15:44:54 EDT Ventricular Rate:  70 PR Interval:  160 QRS Duration:  88 QT Interval:  396 QTC Calculation: 427 R Axis:   20  Text Interpretation: Normal sinus rhythm Normal ECG When compared with ECG of 27-Jan-2023 15:54, No significant change was found Confirmed by Perla Lye [phone]) on 02/24/2024 4:11:07 PM    PMH:   has a past medical history of Anxiety, Arthritis, COVID-19 (06/2019), COVID-19 virus infection (07/03/2019), Cyclical vomiting, Depression, OSA on CPAP, and Vertigo (01/03/2015).  PSH:    Past Surgical History:  Procedure Laterality Date   BONE TUMOR EXCISION  1992   left arm   CHOLECYSTECTOMY  2010   lap Cholecystectomy   ct scan of abdomen  06/18/2012   with Contrast; 7cm right adrenal mass. 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 90 tablet 3   ALPRAZolam  (XANAX ) 0.5 MG tablet TAKE 1 TABLET BY MOUTH EVERY 4 HOURS AS NEEDED FOR ANXIETY 30 tablet 1   ARIPiprazole  (ABILIFY ) 2 MG tablet Take 1 tablet (2 mg total) by mouth daily. 30 tablet 2   blood glucose meter kit and supplies KIT Dispense based on patient and insurance preference. Use up to four times daily as directed. (FOR ICD-9 250.00, 250.01). 1 each 0   Continuous Glucose Receiver (FREESTYLE LIBRE 3 READER) DEVI Use to check sugar up to four times a day as needed for uncontrolled insulin  dependent type 2 diabetes. 1 each 1   Continuous Glucose Sensor (FREESTYLE LIBRE 3 PLUS SENSOR) MISC Use to check blood sugar as needed for type 2 diabetes. Change sensor every 15 days. 6 each 3   EMBECTA PEN NEEDLE NANO 2 GEN 32G X 4 MM MISC USE AS DIRECTED 4 TIMES A DAY 100 each 3   finasteride  (PROSCAR ) 5 MG tablet TAKE 1 TABLET (5 MG TOTAL) BY MOUTH DAILY. 90 tablet 2   fluconazole  (DIFLUCAN ) 150 MG tablet TAKE 1 TABLET BY MOUTH AS ONE DOSE 1 tablet 3   insulin  aspart (NOVOLOG  FLEXPEN) 100 UNIT/ML FlexPen Inject up to 20 units three times daily  before meals as directed 15 mL 11   lamoTRIgine  (LAMICTAL ) 200 MG tablet Take 2 tablets (400 mg total) by mouth daily. 60 tablet 2   LANTUS  SOLOSTAR 100 UNIT/ML Solostar Pen Inject 26 Units into the skin daily.     meloxicam  (MOBIC ) 15 MG tablet Take 1 tablet (15 mg total) by mouth daily. 44 tablet 0   metFORMIN  (GLUCOPHAGE -XR) 500 MG 24 hr tablet TAKE 2 TABLETS BY MOUTH TWICE A DAY 120 tablet 12   omeprazole  (PRILOSEC) 40 MG capsule TAKE 1 CAPSULE BY MOUTH EVERY DAY 30 capsule 5   oxyCODONE -acetaminophen  (PERCOCET) 7.5-325 MG tablet Take 1 tablet by mouth every 4  (four) hours as needed. 60 tablet 0   pioglitazone  (ACTOS ) 30 MG tablet TAKE 1 TABLET BY MOUTH EVERY DAY 30 tablet 5   pregabalin  (LYRICA ) 150 MG capsule Take 1 capsule (150 mg total) by mouth 2 (two) times daily. 60 capsule 2   prochlorperazine  (COMPAZINE ) 10 MG tablet TAKE 1/2 - 1 TABLETS BY MOUTH EVERY 6 (SIX) HOURS AS NEEDED FOR NAUSEA OR VOMITING. 90 tablet 2   promethazine  (PHENERGAN ) 25 MG tablet Take 25 mg by mouth every 6 (six) hours as needed for nausea or vomiting.     rosuvastatin  (CRESTOR ) 20 MG tablet TAKE 1 TABLET BY MOUTH EVERY DAY 90 tablet 4   sildenafil  (VIAGRA ) 50 MG tablet Take 1 tablet (50 mg total) by mouth daily as needed for erectile dysfunction. 30 tablet 5   tirzepatide  (MOUNJARO ) 5 MG/0.5ML Pen Inject 5 mg into the skin once a week. 2 mL 2   Vilazodone  HCl (VIIBRYD ) 40 MG TABS Take 1 tablet (40 mg total) by mouth daily. 30 tablet 2   No current facility-administered medications for this visit.    Allergies:   Bupropion  and Morphine    Social History:  The patient  reports that he quit smoking about 26 years ago. His smoking use included cigarettes. He started smoking about 31 years ago. He has never used smokeless tobacco. He reports that he does not currently use alcohol. He reports current drug use. Frequency: 7.00 times per week. Drug: Marijuana.   Family History:   family history includes Alcohol abuse in an other family member; Arthritis in an other family member; Bipolar disorder in an other family member; CAD in an other family member; Depression in his mother; Heart attack in his brother; Heart attack (age of onset: 80) in his father; Hyperlipidemia in an other family member; Hypertension in an other family member; Lung cancer in an other family member; Stroke in an other family member.   Review of Systems: Review of Systems  Constitutional: Negative.   HENT: Negative.    Respiratory: Negative.    Cardiovascular: Negative.   Gastrointestinal: Negative.    Musculoskeletal: Negative.   Neurological: Negative.   Psychiatric/Behavioral: Negative.    All other systems reviewed and are negative.   PHYSICAL EXAM: VS:  BP 100/60 (BP Location: Left Arm, Patient Position: Sitting, Cuff Size: Normal)   Pulse 70   Ht 6' 1 (1.854 m)   Wt 273 lb 4 oz (123.9 kg)   SpO2 99%   BMI 36.05 kg/m  , BMI Body mass index is 36.05 kg/m. Constitutional:  oriented to person, place, and time. No distress.  HENT:  Head: Grossly normal Eyes:  no discharge. No scleral icterus.  Neck: No JVD, no carotid bruits  Cardiovascular: Regular rate and rhythm, no murmurs appreciated Pulmonary/Chest: Clear to auscultation bilaterally, no wheezes or rales Abdominal:  Soft.  no distension.  no tenderness.  Musculoskeletal: Normal range of motion Neurological:  normal muscle tone. Coordination normal. No atrophy Skin: Skin warm and dry Psychiatric: normal affect, pleasant  Recent Labs: 08/11/2023: ALT 28; BUN 16; Creatinine, Ser 1.03; Hemoglobin 15.7; Platelets 235; Potassium 4.6; Sodium 140   Lipid Panel Lab Results  Component Value Date   CHOL 173 04/18/2023   HDL 37 (L) 04/18/2023   LDLCALC 90 04/18/2023   TRIG 274 (H) 04/18/2023     Wt Readings from Last 3 Encounters:  02/24/24 273 lb 4 oz (123.9 kg)  01/14/24 264 lb 11.2 oz (120.1 kg)  11/26/23 287 lb 8 oz (130.4 kg)     ASSESSMENT AND PLAN:  Problem List Items Addressed This Visit   None Visit Diagnoses       Chest pressure    -  Primary   Relevant Orders   EKG 12-Lead (Completed)     Other chest pain       Relevant Orders   EKG 12-Lead (Completed)      Chest pain Some atypical features Calcium  score as above for risk stratification For worsening symptoms consider cardiac CTA To Family history premature cardiac disease -Remote history of smoking -Weight dramatically improved over the past several years, A1c improving Tolerating Crestor  20 daily -Again discussed screening studies, we  have placed order for CT calcium  scoring -Having chest pain with atypical features  Hyperlipidemia Continue Crestor  20 daily Calcium  score as above to help guide management of lipids  Diabetes type 2 with no complications A1c improving with weight loss    Signed, Velinda Lunger, M.D., Ph.D. [facility] [place], [place] [phone]

## 2024-02-25 ENCOUNTER — Other Ambulatory Visit: Payer: Self-pay | Admitting: Family Medicine

## 2024-02-25 ENCOUNTER — Ambulatory Visit (INDEPENDENT_AMBULATORY_CARE_PROVIDER_SITE_OTHER): Admitting: Clinical

## 2024-02-25 DIAGNOSIS — F411 Generalized anxiety disorder: Secondary | ICD-10-CM | POA: Diagnosis not present

## 2024-02-25 DIAGNOSIS — F332 Major depressive disorder, recurrent severe without psychotic features: Secondary | ICD-10-CM

## 2024-02-25 DIAGNOSIS — G8929 Other chronic pain: Secondary | ICD-10-CM

## 2024-02-25 NOTE — Progress Notes (Signed)
   Darice Seats, LCSW

## 2024-02-25 NOTE — Telephone Encounter (Signed)
 FYI Only or Action Required?: Action required by provider: medication refill request.  Patient was last seen in primary care on 01/14/2024 by Gasper Nancyann BRAVO, MD.  Called Nurse Triage reporting No chief complaint on file..  Symptoms began today.  Interventions attempted: Nothing.  Symptoms are: stable.  Triage Disposition: No disposition on file.  Patient/caregiver understands and will follow disposition?:

## 2024-02-25 NOTE — Progress Notes (Signed)
 [facility] Counselor/Therapist Progress Note  Patient ID: Raymond Rose, MRN: [id number],    Date: 02/25/2024  Time Spent: 9:36am - 10:16am : 40 minutes   Treatment Type: Individual Therapy  Reported Symptoms: none reported  Mental Status Exam: Appearance:  Neat and Well Groomed     Behavior: Appropriate  Motor: Normal  Speech/Language:  Clear and Coherent and Normal Rate  Affect: Appropriate  Mood: normal  Thought process: normal  Thought content:   WNL  Sensory/Perceptual disturbances:   WNL  Orientation: oriented to person, place, time/date, and situation  Attention: Good  Concentration: Good  Memory: WNL  Fund of knowledge:  Good  Insight:   Good  Judgment:  Good  Impulse Control: Good   Risk Assessment: Danger to Self:  No Patient denied current suicidal ideation. Patient stated,  it really just depended on how last week went in response to clinician's inquiry to assess for suicidal ideation.  Self-injurious Behavior: No Danger to Others: No Patient denied current homicidal ideation Duty to Warn:no Physical Aggression / Violence:No  Access to Firearms a concern: No  Gang Involvement:No   Subjective: Patient reported no changes since last session and stated, its been going pretty good, everything's been going well in response to events since last session. Patient stated, I feel pretty good, baseline, stable in response to current mood. Patient reported no current hallucinations and reported no hallucinations in a year. Patient stated, those both seem pretty right on with what I've been diagnosed in the past in response to diagnoses. Patient stated, definitely I need to do that in response to a consultation with a psychiatrist. Patient reported a history of auditory hallucinations when taking lithium . Patient stated, that's what I feel like I really need is something focused in response to therapy recommendation. Patient stated, Id like  to try that, I would be very open to a referral in reference to recommendation for therapy with a clinician with expertise in treatment of trauma.   Interventions: Motivational Interviewing. Clinician conducted session via caregility video from clinician's office at [facility]. Patient provided verbal consent to proceed with telehealth session and is aware of limitations of telephone or video visits. Patient participated in session from patient's home. Reviewed events since last session and assessed for changes. Clinician reviewed diagnoses and treatment recommendations. Provided psycho education related to diagnoses and treatment. Discussed clinician's scope of practice and the importance of connecting patient with a provider with expertise in treatment of trauma. Clinician will initiate a referral to a provider with expertise in treatment of trauma.  Collaboration of Care: Other Discussed consents required for referrals to psychiatrist and clinician with expertise in treatment of trauma  Patient/Guardian was advised Release of Information must be obtained prior to any record release in order to collaborate their care with an outside provider. Patient/Guardian was advised if they have not already done so to contact [facility] to sign all necessary forms in order for us  to release information regarding their care.   Consent: Patient/Guardian gives verbal consent for treatment and assignment of benefits for services provided during this visit. Patient/Guardian expressed understanding and agreed to proceed.   Diagnosis:Severe episode of recurrent major depressive disorder, without psychotic features (HCC)  Generalized anxiety disorder  Plan: Clinician will initiate a referral to a provider/therapist with expertise in treatment of trauma. Goals/target dates will not be developed dur to patient being referred to a provider/therapist with expertise in treatment of trauma.    Darice Seats,  LCSW

## 2024-02-25 NOTE — Telephone Encounter (Signed)
 Copied from CRM [id number]. Topic: Clinical - Medication Refill >> Feb 25, 2024  2:59 PM Mia F wrote: Medication: oxyCODONE -acetaminophen  (PERCOCET) 7.5-325 MG tablet   Has the patient contacted their pharmacy? Yes (Agent: If no, request that the patient contact the pharmacy for the refill. If patient does not wish to contact the pharmacy document the reason why and proceed with request.) (Agent: If yes, when and what did the pharmacy advise?)  This is the patient's preferred pharmacy:  [id number] [place] - [address] [place] [address] Phone: [phone] Fax: [phone]    Is this the correct pharmacy for this prescription? Yes If no, delete pharmacy and type the correct one.   Has the prescription been filled recently? Yes  Is the patient out of the medication? Yes  Has the patient been seen for an appointment in the last year OR does the patient have an upcoming appointment? Yes  Can we respond through MyChart? Yes  Agent: Please be advised that Rx refills may take up to 3 business days. We ask that you follow-up with your pharmacy.

## 2024-02-26 NOTE — Telephone Encounter (Signed)
 Requested medications are due for refill today.  yes  Requested medications are on the active medications list.  yes  Last refill. 02/11/2024 #60 0 rf  Future visit scheduled.   yes  Notes to clinic.  Refill not delegated.    Requested Prescriptions  Pending Prescriptions Disp Refills   oxyCODONE -acetaminophen  (PERCOCET) 7.5-325 MG tablet 60 tablet 0    Sig: Take 1 tablet by mouth every 4 (four) hours as needed.     Not Delegated - Analgesics:  Opioid Agonist Combinations Failed - 02/26/2024  2:50 PM      Failed - This refill cannot be delegated      Failed - Urine Drug Screen completed in last 360 days      Passed - Valid encounter within last 3 months    Recent Outpatient Visits           1 month ago High risk medication use   [place] [facility] Gasper Nancyann BRAVO, MD   3 months ago Type 2 diabetes mellitus without complication, without long-term current use of insulin  [facility])   [place] [facility] Gasper Nancyann BRAVO, MD   4 months ago Urinary retention   [facility] Gasper Nancyann BRAVO, MD   5 months ago Urinary tract infection without hematuria, site unspecified   [facility] Gasper Nancyann BRAVO, MD   6 months ago Type 2 diabetes mellitus without complication, without long-term current use of insulin  [facility])   [facility] Gasper Nancyann BRAVO, MD

## 2024-02-27 ENCOUNTER — Ambulatory Visit: Admitting: Family Medicine

## 2024-03-01 ENCOUNTER — Ambulatory Visit: Admitting: Family Medicine

## 2024-03-01 MED ORDER — OXYCODONE-ACETAMINOPHEN 7.5-325 MG PO TABS: 1.0000 | ORAL_TABLET | ORAL | 0 refills | Status: DC | PRN

## 2024-03-04 DIAGNOSIS — M9902 Segmental and somatic dysfunction of thoracic region: Secondary | ICD-10-CM | POA: Diagnosis not present

## 2024-03-04 DIAGNOSIS — M5414 Radiculopathy, thoracic region: Secondary | ICD-10-CM | POA: Diagnosis not present

## 2024-03-04 DIAGNOSIS — M62830 Muscle spasm of back: Secondary | ICD-10-CM | POA: Diagnosis not present

## 2024-03-04 DIAGNOSIS — M9903 Segmental and somatic dysfunction of lumbar region: Secondary | ICD-10-CM | POA: Diagnosis not present

## 2024-03-10 ENCOUNTER — Other Ambulatory Visit: Payer: Self-pay | Admitting: Family Medicine

## 2024-03-10 ENCOUNTER — Ambulatory Visit (INDEPENDENT_AMBULATORY_CARE_PROVIDER_SITE_OTHER): Admitting: Family Medicine

## 2024-03-10 ENCOUNTER — Encounter: Payer: Self-pay | Admitting: Family Medicine

## 2024-03-10 VITALS — BP 103/67 | HR 76 | Resp 14 | Ht 73.0 in | Wt 277.0 lb

## 2024-03-10 DIAGNOSIS — E119 Type 2 diabetes mellitus without complications: Secondary | ICD-10-CM | POA: Diagnosis not present

## 2024-03-10 DIAGNOSIS — Z23 Encounter for immunization: Secondary | ICD-10-CM

## 2024-03-10 DIAGNOSIS — G894 Chronic pain syndrome: Secondary | ICD-10-CM | POA: Diagnosis not present

## 2024-03-10 DIAGNOSIS — M545 Low back pain, unspecified: Secondary | ICD-10-CM

## 2024-03-10 DIAGNOSIS — M791 Myalgia, unspecified site: Secondary | ICD-10-CM | POA: Diagnosis not present

## 2024-03-10 DIAGNOSIS — M2550 Pain in unspecified joint: Secondary | ICD-10-CM

## 2024-03-10 DIAGNOSIS — G8929 Other chronic pain: Secondary | ICD-10-CM

## 2024-03-10 DIAGNOSIS — Z794 Long term (current) use of insulin: Secondary | ICD-10-CM

## 2024-03-10 LAB — POCT GLYCOSYLATED HEMOGLOBIN (HGB A1C)
Est. average glucose Bld gHb Est-mCnc: 131
Hemoglobin A1C: 6.2 % — AB (ref 4.0–5.6)

## 2024-03-10 MED ORDER — PREGABALIN 75 MG PO CAPS: 75.0000 mg | ORAL_CAPSULE | Freq: Two times a day (BID) | ORAL | 3 refills | Status: AC

## 2024-03-10 MED ORDER — NOVOLOG FLEXPEN 100 UNIT/ML ~~LOC~~ SOPN: 16.0000 [IU] | PEN_INJECTOR | Freq: Two times a day (BID) | SUBCUTANEOUS | Status: DC

## 2024-03-10 MED ORDER — LANTUS SOLOSTAR 100 UNIT/ML ~~LOC~~ SOPN: 16.0000 [IU] | PEN_INJECTOR | Freq: Every day | SUBCUTANEOUS | Status: DC

## 2024-03-10 MED ORDER — NOVOLOG FLEXPEN 100 UNIT/ML ~~LOC~~ SOPN: 16.0000 [IU] | PEN_INJECTOR | Freq: Two times a day (BID) | SUBCUTANEOUS | Status: AC

## 2024-03-10 MED ORDER — OXYCODONE-ACETAMINOPHEN 7.5-325 MG PO TABS: 1.0000 | ORAL_TABLET | ORAL | 0 refills | Status: DC | PRN

## 2024-03-10 NOTE — Progress Notes (Signed)
 Established patient visit   Patient: Raymond Rose   DOB: April 29, 1976   48 y.o. Male  MRN: [id number] Visit Date: 03/10/2024  Today's healthcare provider: Nancyann Perry, MD   Chief Complaint  Patient presents with   Diabetes    Glucose monitor- going good    Subjective    Discussed the use of AI scribe software for clinical note transcription with the patient, who gave verbal consent to proceed.  History of Present Illness   Raymond Rose is a 48 year old male with diabetes who presents for follow up.  He is experiencing frequent episodes of hypoglycemia, with the lowest recorded at 48 mg/dL. These episodes are characterized by extreme sweating and lightheadedness, occurring often overnight and waking him around 3 AM. They also happen if he skips meals. He monitors his blood sugar at home, noting fasting levels lower than usual, not exceeding 200 mg/dL and often around 859-849 mg/dL. His current medications include Lantus  at 27 units once daily, Novolog  at 20 mg twice daily, and Mounjaro  at 5 mg.  He has ongoing gastrointestinal issues affecting his ability to eat regularly. He feels like he has been 'hit by a truck' since his Lyrica  dose was increased to 150 mg twice daily, with persistent muscle and joint aches. He is seeing a chiropractor regularly for these symptoms. He has a family history of arthritis, as his mother had severe arthritis, and he feels he might be developing similar symptoms, particularly in his hands.  He is also due for refill for oxycodone /apap for chronic back pain which remains effective.   Lab Results  Component Value Date   HGBA1C 6.2 (A) 03/10/2024   HGBA1C 8.3 (A) 11/26/2023   HGBA1C 9.3 (A) 08/06/2023   Lab Results  Component Value Date   NA 140 08/11/2023   K 4.6 08/11/2023   CREATININE 1.03 08/11/2023   GFRNONAA >60 08/11/2023   GLUCOSE 203 (H) 08/11/2023   Lab Results  Component Value Date   CHOL 173 04/18/2023   HDL 37  (L) 04/18/2023   LDLCALC 90 04/18/2023   TRIG 274 (H) 04/18/2023   CHOLHDL 4.7 04/18/2023         Medications: Outpatient Medications Prior to Visit  Medication Sig   alfuzosin  (UROXATRAL ) 10 MG 24 hr tablet TAKE 1 TABLET (10 MG TOTAL) BY MOUTH DAILY WITH BREAKFAST.   ALPRAZolam  (XANAX ) 0.5 MG tablet TAKE 1 TABLET BY MOUTH EVERY 4 HOURS AS NEEDED FOR ANXIETY   ARIPiprazole  (ABILIFY ) 2 MG tablet Take 1 tablet (2 mg total) by mouth daily.   blood glucose meter kit and supplies KIT Dispense based on patient and insurance preference. Use up to four times daily as directed. (FOR ICD-9 250.00, 250.01).   Continuous Glucose Receiver (FREESTYLE LIBRE 3 READER) DEVI Use to check sugar up to four times a day as needed for uncontrolled insulin  dependent type 2 diabetes.   Continuous Glucose Sensor (FREESTYLE LIBRE 3 PLUS SENSOR) MISC Use to check blood sugar as needed for type 2 diabetes. Change sensor every 15 days.   EMBECTA PEN NEEDLE NANO 2 GEN 32G X 4 MM MISC USE AS DIRECTED 4 TIMES A DAY   finasteride  (PROSCAR ) 5 MG tablet TAKE 1 TABLET (5 MG TOTAL) BY MOUTH DAILY.   fluconazole  (DIFLUCAN ) 150 MG tablet TAKE 1 TABLET BY MOUTH AS ONE DOSE   lamoTRIgine  (LAMICTAL ) 200 MG tablet Take 2 tablets (400 mg total) by mouth daily.   meloxicam  (MOBIC ) 15  MG tablet Take 1 tablet (15 mg total) by mouth daily.   metFORMIN  (GLUCOPHAGE -XR) 500 MG 24 hr tablet TAKE 2 TABLETS BY MOUTH TWICE A DAY   omeprazole  (PRILOSEC) 40 MG capsule TAKE 1 CAPSULE BY MOUTH EVERY DAY   pioglitazone  (ACTOS ) 30 MG tablet TAKE 1 TABLET BY MOUTH EVERY DAY   prochlorperazine  (COMPAZINE ) 10 MG tablet TAKE 1/2 - 1 TABLETS BY MOUTH EVERY 6 (SIX) HOURS AS NEEDED FOR NAUSEA OR VOMITING.   promethazine  (PHENERGAN ) 25 MG tablet Take 25 mg by mouth every 6 (six) hours as needed for nausea or vomiting.   rosuvastatin  (CRESTOR ) 20 MG tablet TAKE 1 TABLET BY MOUTH EVERY DAY   sildenafil  (VIAGRA ) 50 MG tablet Take 1 tablet (50 mg total) by  mouth daily as needed for erectile dysfunction.   tirzepatide  (MOUNJARO ) 5 MG/0.5ML Pen Inject 5 mg into the skin once a week.   Vilazodone  HCl (VIIBRYD ) 40 MG TABS Take 1 tablet (40 mg total) by mouth daily.   insulin  aspart (NOVOLOG  FLEXPEN) 100 UNIT/ML FlexPen Inject up to 20 units two times daily before meals as directed   LANTUS  SOLOSTAR 100 UNIT/ML Solostar Pen Inject 27 Units into the skin daily.   oxyCODONE -acetaminophen  (PERCOCET) 7.5-325 MG tablet Take 1 tablet by mouth every 4 (four) hours as needed.   pregabalin  (LYRICA ) 150 MG capsule Take 1 capsule (150 mg total) by mouth 2 (two) times daily.   No facility-administered medications prior to visit.   Review of Systems  Constitutional:  Negative for appetite change, chills and fever.  Respiratory:  Negative for chest tightness, shortness of breath and wheezing.   Cardiovascular:  Negative for chest pain and palpitations.  Gastrointestinal:  Negative for abdominal pain, nausea and vomiting.       Objective    BP 103/67   Pulse 76   Resp 14   Ht 6' 1 (1.854 m)   Wt 277 lb (125.6 kg)   SpO2 99%   BMI 36.55 kg/m   Physical Exam   General appearance: Mildly obese male, cooperative and in no acute distress Head: Normocephalic, without obvious abnormality, atraumatic Respiratory: Respirations even and unlabored, normal respiratory rate Extremities: All extremities are intact.  Skin: Skin color, texture, turgor normal. No rashes seen  Psych: Appropriate mood and affect. Neurologic: Mental status: Alert, oriented to person, place, and time, thought content appropriate.   Results for orders placed or performed in visit on 03/10/24  POCT HgB A1C  Result Value Ref Range   Hemoglobin A1C 6.2 (A) 4.0 - 5.6 %   Est. average glucose Bld gHb Est-mCnc 131     Assessment & Plan     1. Type 2 diabetes mellitus without complication, without long-term current use of insulin  (HCC) (Primary) A1c much better, but having nocturnal  hypoglycemia. Tolerating 5mg  Mounjaro  well. Will reduce Lantus  from 27 units to 16 units a day. Reduce Novolog  from 20  units BID with meals to 16.   2. Chronic low back pain, unspecified back pain laterality, unspecified whether sciatica present Pain adequately controlled, refill oxyCODONE -acetaminophen  (PERCOCET) 7.5-325 MG tablet; Take 1 tablet by mouth every 4 (four) hours as needed.  Dispense: 60 tablet; Refill: 0  3. Chronic pain syndrome Has not noticed any improvement since increasing dose of Lyrica , will wean back down to pregabalin  (LYRICA ) 75 MG capsule; Take 1 capsule (75 mg total) by mouth 2 (two) times daily.  Dispense: 60 capsule; Refill: 3  Rheumatology lab evaluation as below.   4.  Arthralgia, unspecified joint  - ANA w/Reflex if Positive - CYCLIC CITRUL PEPTIDE ANTIBODY, IGG/IGA - Rheumatoid factor - Sedimentation rate - CK  5. Myalgia  - ANA w/Reflex if Positive - CYCLIC CITRUL PEPTIDE ANTIBODY, IGG/IGA - Rheumatoid factor - Sedimentation rate - CK  6. Immunization due  - Flu vaccine trivalent PF, 6mos and older(Flulaval,Afluria,Fluarix,Fluzone)    Nancyann Perry, MD  [facility] [phone] (phone) [phone] (fax)  [facility]

## 2024-03-10 NOTE — Patient Instructions (Addendum)
 Please review the attached list of medications and notify my office if there are any errors.   REDUCE Lantus  (insulin  glargine) to 16mg  once  REDUCE Novolog  (insulin  aspart) to 16mg  Twice a day with meals

## 2024-03-10 NOTE — Telephone Encounter (Unsigned)
 Copied from CRM #[id number]. Topic: Clinical - Medication Refill >> Mar 10, 2024 10:55 AM Charlet HERO wrote: Medication: oxyCODONE -acetaminophen  (PERCOCET) 7.5-325 MG tablet  Has the patient contacted their pharmacy? Yes 0 refill  This is the patient's preferred pharmacy:  [id number] [place] - [address] [place] [place] [place] [address] Phone: [phone] Fax: [phone]    Is this the correct pharmacy for this prescription? Yes If no, delete pharmacy and type the correct one.   Has the prescription been filled recently? Yes  Is the patient out of the medication? Yes  Has the patient been seen for an appointment in the last year OR does the patient have an upcoming appointment? Yes  Can we respond through MyChart? Yes  Agent: Please be advised that Rx refills may take up to 3 business days. We ask that you follow-up with your pharmacy.

## 2024-03-13 LAB — ANA W/REFLEX IF POSITIVE: Anti Nuclear Antibody (ANA): NEGATIVE

## 2024-03-13 LAB — CK: Total CK: 79 U/L (ref 49–439)

## 2024-03-13 LAB — RHEUMATOID FACTOR: Rheumatoid fact SerPl-aCnc: <10 [IU]/mL (ref <14.0)

## 2024-03-13 LAB — SEDIMENTATION RATE: Sed Rate: 13 mm/h (ref 0–15)

## 2024-03-13 LAB — CYCLIC CITRUL PEPTIDE ANTIBODY, IGG/IGA: Cyclic Citrullin Peptide Ab: 7 U (ref 0–19)

## 2024-03-14 ENCOUNTER — Ambulatory Visit: Payer: Self-pay | Admitting: Family Medicine

## 2024-03-18 ENCOUNTER — Encounter: Payer: Self-pay | Admitting: Emergency Medicine

## 2024-03-19 DIAGNOSIS — M9902 Segmental and somatic dysfunction of thoracic region: Secondary | ICD-10-CM | POA: Diagnosis not present

## 2024-03-19 DIAGNOSIS — M5414 Radiculopathy, thoracic region: Secondary | ICD-10-CM | POA: Diagnosis not present

## 2024-03-19 DIAGNOSIS — M9903 Segmental and somatic dysfunction of lumbar region: Secondary | ICD-10-CM | POA: Diagnosis not present

## 2024-03-19 DIAGNOSIS — M62830 Muscle spasm of back: Secondary | ICD-10-CM | POA: Diagnosis not present

## 2024-03-25 ENCOUNTER — Other Ambulatory Visit: Payer: Self-pay | Admitting: Family Medicine

## 2024-03-25 DIAGNOSIS — G8929 Other chronic pain: Secondary | ICD-10-CM

## 2024-03-25 NOTE — Telephone Encounter (Signed)
 Copied from CRM [id number]. Topic: Clinical - Medication Refill >> Mar 25, 2024  2:45 PM [name] wrote: Medication: oxyCODONE -acetaminophen  (PERCOCET) 7.5-325 MG tablet  Has the patient contacted their pharmacy? No   This is the patient's preferred pharmacy:  [facility] [phone] [facility] [place] - [address] [place] [address] Phone: [phone] Fax: [phone]   Is this the correct pharmacy for this prescription? Yes If no, delete pharmacy and type the correct one.   Has the prescription been filled recently? No  Is the patient out of the medication? No but he only has enough for one more day  Has the patient been seen for an appointment in the last year OR does the patient have an upcoming appointment? Yes  Can we respond through MyChart? Yes  Please assist patient further

## 2024-03-26 ENCOUNTER — Other Ambulatory Visit: Payer: Self-pay | Admitting: Family Medicine

## 2024-03-26 DIAGNOSIS — E1110 Type 2 diabetes mellitus with ketoacidosis without coma: Secondary | ICD-10-CM

## 2024-03-29 ENCOUNTER — Other Ambulatory Visit: Payer: Self-pay

## 2024-03-29 ENCOUNTER — Telehealth: Payer: Self-pay

## 2024-03-29 DIAGNOSIS — M545 Low back pain, unspecified: Secondary | ICD-10-CM

## 2024-03-29 NOTE — Telephone Encounter (Signed)
 Copied from CRM [id number]. Topic: Referral - Question >> Mar 29, 2024  9:52 AM Alfonso HERO wrote: Reason for CRM: Patient stated that [facility] told him that they need the last office note and medication list before they can schedule him.Please send those over asap.

## 2024-03-29 NOTE — Telephone Encounter (Signed)
 Requested medications are due for refill today.  yes  Requested medications are on the active medications list.  yes  Last refill. 03/10/2024 #60 0 rf  Future visit scheduled.   yes  Notes to clinic.  Refill not delegated.    Requested Prescriptions  Pending Prescriptions Disp Refills   oxyCODONE -acetaminophen  (PERCOCET) 7.5-325 MG tablet 60 tablet 0    Sig: Take 1 tablet by mouth every 4 (four) hours as needed.     Not Delegated - Analgesics:  Opioid Agonist Combinations Failed - 03/29/2024 12:16 PM      Failed - This refill cannot be delegated      Failed - Urine Drug Screen completed in last 360 days      Passed - Valid encounter within last 3 months    Recent Outpatient Visits           2 weeks ago Type 2 diabetes mellitus without complication, without long-term current use of insulin  (HCC)   [place] [facility] Gasper Nancyann BRAVO, MD   2 months ago High risk medication use   [facility] Gasper Nancyann BRAVO, MD   4 months ago Type 2 diabetes mellitus without complication, without long-term current use of insulin  [facility]   [facility] [facility] Gasper Nancyann BRAVO, MD   5 months ago Urinary retention   [facility] Gasper Nancyann BRAVO, MD   6 months ago Urinary tract infection without hematuria, site unspecified   [facility] Gasper Nancyann BRAVO, MD

## 2024-03-30 MED ORDER — OXYCODONE-ACETAMINOPHEN 7.5-325 MG PO TABS: 1.0000 | ORAL_TABLET | ORAL | 0 refills | Status: DC | PRN

## 2024-03-30 NOTE — Telephone Encounter (Signed)
 Copied from CRM [id number]. Topic: General - Other >> Mar 30, 2024 10:57 AM Tobias CROME wrote: Reason for CRM: Patient following up on mychart message he sent. Patient requesting update.   Can be reached at via mychart.

## 2024-03-31 ENCOUNTER — Ambulatory Visit (INDEPENDENT_AMBULATORY_CARE_PROVIDER_SITE_OTHER): Admitting: Emergency Medicine

## 2024-03-31 VITALS — Ht 73.0 in | Wt 270.0 lb

## 2024-03-31 DIAGNOSIS — Z1211 Encounter for screening for malignant neoplasm of colon: Secondary | ICD-10-CM

## 2024-03-31 DIAGNOSIS — Z0000 Encounter for general adult medical examination without abnormal findings: Secondary | ICD-10-CM | POA: Diagnosis not present

## 2024-03-31 NOTE — Patient Instructions (Addendum)
 [name],  Thank you for taking the time for your Medicare Wellness Visit. I appreciate your continued commitment to your health goals. Please review the care plan we discussed, and feel free to reach out if I can assist you further.  Medicare recommends these wellness visits once per year to help you and your care team stay ahead of potential health issues. These visits are designed to focus on prevention, allowing your provider to concentrate on managing your acute and chronic conditions during your regular appointments.  Please note that Annual Wellness Visits do not include a physical exam. Some assessments may be limited, especially if the visit was conducted virtually. If needed, we may recommend a separate in-person follow-up with your provider.  Ongoing Care Seeing your primary care provider every 3 to 6 months helps us  monitor your health and provide consistent, personalized care.   Referrals If a referral was made during today's visit and you haven't received any updates within two weeks, please contact the referred provider directly to check on the status.         Gastroenterology (GI) for a screening colonoscopy        [facility] at [facility]         [address]         [address]         [place], [place] [address]         [phone]           Recommended Screenings:   Get the covid vaccine at your convenience.  Call to schedule a diabetic eye exam at your earliest convenience. Dr. Francis Mallick ph# [phone]   Health Maintenance  Topic Date Due   Pneumococcal Vaccine (1 of 2 - PCV) Never done   Hepatitis B Vaccine (1 of 3 - 19+ 3-dose series) Never done   HPV Vaccine (1 - 3-dose SCDM series) Never done   Colon Cancer Screening  Never done   COVID-19 Vaccine (4 - 2025-26 season) 02/16/2024   Eye exam for diabetics  03/23/2024   Yearly kidney function blood test for diabetes  08/10/2024   Hemoglobin A1C  09/07/2024   Yearly  kidney health urinalysis for diabetes  11/25/2024   Medicare Annual Wellness Visit  03/31/2025   DTaP/Tdap/Td vaccine (2 - Td or Tdap) 05/23/2026   Flu Shot  Completed   Hepatitis C Screening  Completed   HIV Screening  Completed   Meningitis B Vaccine  Aged Out       03/31/2024    2:10 PM  Advanced Directives  Does Patient Have a Medical Advance Directive? No  Would patient like information on creating a medical advance directive? No - Patient declined   Advance Care Planning is important because it: Ensures you receive medical care that aligns with your values, goals, and preferences. Provides guidance to your family and loved ones, reducing the emotional burden of decision-making during critical moments.  Vision: Annual vision screenings are recommended for early detection of glaucoma, cataracts, and diabetic retinopathy. These exams can also reveal signs of chronic conditions such as diabetes and high blood pressure.  Dental: Annual dental screenings help detect early signs of oral cancer, gum disease, and other conditions linked to overall health, including heart disease and diabetes.  Please see the attached documents for additional preventive care recommendations.   Fall Prevention in the Home, Adult Falls can cause injuries and affect people of all ages. There are many simple things that  you can do to make your home safe and to help prevent falls. If you need it, ask for help making these changes. What actions can I take to prevent falls? General information Use good lighting in all rooms. Make sure to: Replace any light bulbs that burn out. Turn on lights if it is dark and use night-lights. Keep items that you use often in easy-to-reach places. Lower the shelves around your home if needed. Move furniture so that there are clear paths around it. Do not keep throw rugs or other things on the floor that can make you trip. If any of your floors are uneven, fix them. Add  color or contrast paint or tape to clearly mark and help you see: Grab bars or handrails. First and last steps of staircases. Where the edge of each step is. If you use a ladder or stepladder: Make sure that it is fully opened. Do not climb a closed ladder. Make sure the sides of the ladder are locked in place. Have someone hold the ladder while you use it. Know where your pets are as you move through your home. What can I do in the bathroom?     Keep the floor dry. Clean up any water that is on the floor right away. Remove soap buildup in the bathtub or shower. Buildup makes bathtubs and showers slippery. Use non-skid mats or decals on the floor of the bathtub or shower. Attach bath mats securely with double-sided, non-slip rug tape. If you need to sit down while you are in the shower, use a non-slip stool. Install grab bars by the toilet and in the bathtub and shower. Do not use towel bars as grab bars. What can I do in the bedroom? Make sure that you have a light by your bed that is easy to reach. Do not use any sheets or blankets on your bed that hang to the floor. Have a firm bench or chair with side arms that you can use for support when you get dressed. What can I do in the kitchen? Clean up any spills right away. If you need to reach something above you, use a sturdy step stool that has a grab bar. Keep electrical cables out of the way. Do not use floor polish or wax that makes floors slippery. What can I do with my stairs? Do not leave anything on the stairs. Make sure that you have a light switch at the top and the bottom of the stairs. Have them installed if you do not have them. Make sure that there are handrails on both sides of the stairs. Fix handrails that are broken or loose. Make sure that handrails are as long as the staircases. Install non-slip stair treads on all stairs in your home if they do not have carpet. Avoid having throw rugs at the top or bottom of  stairs, or secure the rugs with carpet tape to prevent them from moving. Choose a carpet design that does not hide the edge of steps on the stairs. Make sure that carpet is firmly attached to the stairs. Fix any carpet that is loose or worn. What can I do on the outside of my home? Use bright outdoor lighting. Repair the edges of walkways and driveways and fix any cracks. Clear paths of anything that can make you trip, such as tools or rocks. Add color or contrast paint or tape to clearly mark and help you see high doorway thresholds. Trim any bushes or trees  on the main path into your home. Check that handrails are securely fastened and in good repair. Both sides of all steps should have handrails. Install guardrails along the edges of any raised decks or porches. Have leaves, snow, and ice cleared regularly. Use sand, salt, or ice melt on walkways during winter months if you live where there is ice and snow. In the garage, clean up any spills right away, including grease or oil spills. What other actions can I take? Review your medicines with your health care provider. Some medicines can make you confused or feel dizzy. This can increase your chance of falling. Wear closed-toe shoes that fit well and support your feet. Wear shoes that have rubber soles and low heels. Use a cane, walker, scooter, or crutches that help you move around if needed. Talk with your provider about other ways that you can decrease your risk of falls. This may include seeing a physical therapist to learn to do exercises to improve movement and strength. Where to find more information Centers for Disease Control and Prevention, STEAD[employer]: TonerPromos.no [employer] on Aging: BaseRingTones.pl National Institute on Aging: BaseRingTones.pl Contact a health care provider if: You are afraid of falling at home. You feel weak, drowsy, or dizzy at home. You fall at home. Get help right away if you: Lose consciousness or have trouble  moving after a fall. Have a fall that causes a head injury. These symptoms may be an emergency. Get help right away. Call 911. Do not wait to see if the symptoms will go away. Do not drive yourself to the hospital. This information is not intended to replace advice given to you by your health care provider. Make sure you discuss any questions you have with your health care provider. Document Revised: 02/04/2022 Document Reviewed: 02/04/2022 Elsevier Patient Education  2024 Elsevier Inc.   Managing Pain Without Opioids Opioids are strong medicines used to treat moderate to severe pain. For some people, especially those who have long-term (chronic) pain, opioids may not be the best choice for pain management due to: Side effects like nausea, constipation, and sleepiness. The risk of addiction (opioid use disorder). The longer you take opioids, the greater your risk of addiction. Pain that lasts for more than 3 months is called chronic pain. Managing chronic pain usually requires more than one approach and is often provided by a team of health care providers working together (multidisciplinary approach). Pain management may be done at a pain management center or pain clinic. How to manage pain without the use of opioids Use non-opioid medicines Non-opioid medicines for pain may include: Over-the-counter or prescription non-steroidal anti-inflammatory drugs (NSAIDs). These may be the first medicines used for pain. They work well for muscle and bone pain, and they reduce swelling. Acetaminophen . This over-the-counter medicine may work well for milder pain but not swelling. Antidepressants. These may be used to treat chronic pain. A certain type of antidepressant (tricyclics) is often used. These medicines are given in lower doses for pain than when used for depression. Anticonvulsants. These are usually used to treat seizures but may also reduce nerve (neuropathic) pain. Muscle relaxants. These  relieve pain caused by sudden muscle tightening (spasms). You may also use a pain medicine that is applied to the skin as a patch, cream, or gel (topical analgesic), such as a numbing medicine. These may cause fewer side effects than medicines taken by mouth. Do certain therapies as directed Some therapies can help with pain management. They include: Physical therapy.  You will do exercises to gain strength and flexibility. A physical therapist may teach you exercises to move and stretch parts of your body that are weak, stiff, or painful. You can learn these exercises at physical therapy visits and practice them at home. Physical therapy may also involve: Massage. Heat wraps or applying heat or cold to affected areas. Electrical signals that interrupt pain signals (transcutaneous electrical nerve stimulation, TENS). Weak lasers that reduce pain and swelling (low-level laser therapy). Signals from your body that help you learn to regulate pain (biofeedback). Occupational therapy. This helps you to learn ways to function at home and work with less pain. Recreational therapy. This involves trying new activities or hobbies, such as a physical activity or drawing. Mental health therapy, including: Cognitive behavioral therapy (CBT). This helps you learn coping skills for dealing with pain. Acceptance and commitment therapy (ACT) to change the way you think and react to pain. Relaxation therapies, including muscle relaxation exercises and mindfulness-based stress reduction. Pain management counseling. This may be individual, family, or group counseling.  Receive medical treatments Medical treatments for pain management include: Nerve block injections. These may include a pain blocker and anti-inflammatory medicines. You may have injections: Near the spine to relieve chronic back or neck pain. Into joints to relieve back or joint pain. Into nerve areas that supply a painful area to relieve body  pain. Into muscles (trigger point injections) to relieve some painful muscle conditions. A medical device placed near your spine to help block pain signals and relieve nerve pain or chronic back pain (spinal cord stimulation device). Acupuncture. Follow these instructions at home Medicines Take over-the-counter and prescription medicines only as told by your health care provider. If you are taking pain medicine, ask your health care providers about possible side effects to watch out for. Do not drive or use heavy machinery while taking prescription opioid pain medicine. Lifestyle  Do not use drugs or alcohol to reduce pain. If you drink alcohol, limit how much you have to: 0-1 drink a day for women who are not pregnant. 0-2 drinks a day for men. Know how much alcohol is in a drink. In the [place] one drink equals one 12 oz bottle of beer (355 mL), one 5 oz glass of wine (148 mL), or one 1 oz glass of hard liquor (44 mL). Do not use any products that contain nicotine or tobacco. These products include cigarettes, chewing tobacco, and vaping devices, such as e-cigarettes. If you need help quitting, ask your health care provider. Eat a healthy diet and maintain a healthy weight. Poor diet and excess weight may make pain worse. Eat foods that are high in fiber. These include fresh fruits and vegetables, whole grains, and beans. Limit foods that are high in fat and processed sugars, such as fried and sweet foods. Exercise regularly. Exercise lowers stress and may help relieve pain. Ask your health care provider what activities and exercises are safe for you. If your health care provider approves, join an exercise class that combines movement and stress reduction. Examples include yoga and tai chi. Get enough sleep. Lack of sleep may make pain worse. Lower stress as much as possible. Practice stress reduction techniques as told by your therapist. General instructions Work with all your pain  management providers to find the treatments that work best for you. You are an important member of your pain management team. There are many things you can do to reduce pain on your own. Consider joining an Therapist, sports  or in-person support group for people who have chronic pain. Keep all follow-up visits. This is important. Where to find more information You can find more information about managing pain without opioids from: American Academy of Pain Medicine: painmed.org Institute for Chronic Pain: instituteforchronicpain.org American Chronic Pain Association: theacpa.org Contact a health care provider if: You have side effects from pain medicine. Your pain gets worse or does not get better with treatments or home therapy. You are struggling with anxiety or depression. Summary Many types of pain can be managed without opioids. Chronic pain may respond better to pain management without opioids. Pain is best managed when you and a team of health care providers work together. Pain management without opioids may include non-opioid medicines, medical treatments, physical therapy, mental health therapy, and lifestyle changes. Tell your health care providers if your pain gets worse or is not being managed well enough. This information is not intended to replace advice given to you by your health care provider. Make sure you discuss any questions you have with your health care provider. Document Revised: 09/13/2020 Document Reviewed: 09/13/2020 Elsevier Patient Education  2024 [employer].

## 2024-03-31 NOTE — Progress Notes (Signed)
 Subjective:   Raymond Rose is a 48 y.o. who presents for a Medicare Wellness preventive visit.  As a reminder, Annual Wellness Visits don't include a physical exam, and some assessments may be limited, especially if this visit is performed virtually. We may recommend an in-person follow-up visit with your provider if needed.  Visit Complete: Virtual I connected with  Raymond Rose on 03/31/24 by a audio enabled telemedicine application and verified that I am speaking with the correct person using two identifiers.  Patient Location: Home  Provider Location: Home Office  I discussed the limitations of evaluation and management by telemedicine. The patient expressed understanding and agreed to proceed.  Vital Signs: Because this visit was a virtual/telehealth visit, some criteria may be missing or patient reported. Any vitals not documented were not able to be obtained and vitals that have been documented are patient reported.  VideoDeclined- This patient declined Librarian, academic. Therefore the visit was completed with audio only.  Persons Participating in Visit: Patient.  AWV Questionnaire: No: Patient Medicare AWV questionnaire was not completed prior to this visit.  Cardiac Risk Factors include: male gender;diabetes mellitus;dyslipidemia;obesity (BMI >30kg/m2);Other (see comment), Risk factor comments: OSA (cpap)     Objective:    Today's Vitals   03/31/24 1354  Weight: 270 lb (122.5 kg)  Height: 6' 1 (1.854 m)   Body mass index is 35.62 kg/m.     03/31/2024    2:10 PM 08/11/2023    1:23 PM 02/06/2020   11:35 AM 01/29/2020   12:00 AM 01/24/2020    5:05 PM 01/23/2020    9:32 AM 07/04/2019    1:30 AM  Advanced Directives  Does Patient Have a Medical Advance Directive? No No No No No No No  Would patient like information on creating a medical advance directive? No - Patient declined No - Patient declined   No - Patient declined  No -  Patient declined    Current Medications (verified) Outpatient Encounter Medications as of 03/31/2024  Medication Sig   alfuzosin  (UROXATRAL ) 10 MG 24 hr tablet TAKE 1 TABLET (10 MG TOTAL) BY MOUTH DAILY WITH BREAKFAST.   ALPRAZolam  (XANAX ) 0.5 MG tablet TAKE 1 TABLET BY MOUTH EVERY 4 HOURS AS NEEDED FOR ANXIETY   ARIPiprazole  (ABILIFY ) 2 MG tablet Take 1 tablet (2 mg total) by mouth daily.   blood glucose meter kit and supplies KIT Dispense based on patient and insurance preference. Use up to four times daily as directed. (FOR ICD-9 250.00, 250.01).   Continuous Glucose Receiver (FREESTYLE LIBRE 3 READER) DEVI Use to check sugar up to four times a day as needed for uncontrolled insulin  dependent type 2 diabetes.   Continuous Glucose Sensor (FREESTYLE LIBRE 3 PLUS SENSOR) MISC Use to check blood sugar as needed for type 2 diabetes. Change sensor every 15 days.   finasteride  (PROSCAR ) 5 MG tablet TAKE 1 TABLET (5 MG TOTAL) BY MOUTH DAILY.   insulin  aspart (NOVOLOG  FLEXPEN) 100 UNIT/ML FlexPen Inject 16 Units into the skin 2 (two) times daily.   Insulin  Pen Needle (EMBECTA PEN NEEDLE NANO 2 GEN) 32G X 4 MM MISC USE AS DIRECTED 4 TIMES A DAY   lamoTRIgine  (LAMICTAL ) 200 MG tablet Take 2 tablets (400 mg total) by mouth daily.   LANTUS  SOLOSTAR 100 UNIT/ML Solostar Pen Inject 16 Units into the skin daily.   metFORMIN  (GLUCOPHAGE -XR) 500 MG 24 hr tablet TAKE 2 TABLETS BY MOUTH TWICE A DAY   omeprazole  (PRILOSEC)  40 MG capsule TAKE 1 CAPSULE BY MOUTH EVERY DAY   oxyCODONE -acetaminophen  (PERCOCET) 7.5-325 MG tablet Take 1 tablet by mouth every 4 (four) hours as needed.   pioglitazone  (ACTOS ) 30 MG tablet TAKE 1 TABLET BY MOUTH EVERY DAY   prochlorperazine  (COMPAZINE ) 10 MG tablet TAKE 1/2 - 1 TABLETS BY MOUTH EVERY 6 (SIX) HOURS AS NEEDED FOR NAUSEA OR VOMITING.   rosuvastatin  (CRESTOR ) 20 MG tablet TAKE 1 TABLET BY MOUTH EVERY DAY   sildenafil  (VIAGRA ) 50 MG tablet Take 1 tablet (50 mg total) by mouth  daily as needed for erectile dysfunction.   tirzepatide  (MOUNJARO ) 5 MG/0.5ML Pen Inject 5 mg into the skin once a week.   Vilazodone  HCl (VIIBRYD ) 40 MG TABS Take 1 tablet (40 mg total) by mouth daily.   fluconazole  (DIFLUCAN ) 150 MG tablet TAKE 1 TABLET BY MOUTH AS ONE DOSE (Patient not taking: Reported on 03/31/2024)   meloxicam  (MOBIC ) 15 MG tablet Take 1 tablet (15 mg total) by mouth daily. (Patient not taking: Reported on 03/31/2024)   pregabalin  (LYRICA ) 75 MG capsule Take 1 capsule (75 mg total) by mouth 2 (two) times daily. (Patient not taking: Reported on 03/31/2024)   promethazine  (PHENERGAN ) 25 MG tablet Take 25 mg by mouth every 6 (six) hours as needed for nausea or vomiting. (Patient not taking: Reported on 03/31/2024)   No facility-administered encounter medications on file as of 03/31/2024.    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, 3cm adrenal mass on left   CT scan of Brain  12/23/2011   without contrast, [facility], Normal   TONSILLECTOMY  1985   Family History  Problem Relation Age of Onset   Depression Mother    Heart attack Father 22   Heart attack Brother    Arthritis Other    Alcohol abuse Other    Lung cancer Other    Hyperlipidemia Other    CAD Other    Stroke Other    Hypertension Other    Bipolar disorder Other    Social History   Socioeconomic History   Marital status: Married    Spouse name: [name]   Number of children: 0   Years of education: Not on file   Highest education level: Not on file   Occupational History   Occupation: Banking    Comment: Herbalist   Occupation: disability  Tobacco Use   Smoking status: Former    Current packs/day: 0.00    Average packs/day: 1 pack/day for 5.0 years (5.0 ttl pk-yrs)    Types: Cigarettes    Start date: 06/17/1992    Quit date: 06/17/1997    Years since quitting: 26.8   Smokeless tobacco: Never   Tobacco comments:    started smoking at ahe 16, and quit at age 51  Vaping Use   Vaping status: Never Used  Substance and Sexual Activity   Alcohol use: Not Currently    Comment: occasional alcohol use   Drug  use: Yes    Frequency: 7.0 times per week    Types: Marijuana    Comment: hits bong 2x daily, morning and night   Sexual activity: Yes    Birth control/protection: None  Other Topics Concern   Not on file  Social History Narrative   Not on file   Social Drivers of Health   Financial Resource Strain: Low Risk  (03/31/2024)   Overall Financial Resource Strain (CARDIA)    Difficulty of Paying Living Expenses: Not hard at all  Food Insecurity: No Food Insecurity (03/31/2024)   Hunger Vital Sign    Worried About Running Out of Food in the Last Year: Never true    Ran Out of Food in the Last Year: Never true  Transportation Needs: No Transportation Needs (03/31/2024)   PRAPARE - Administrator, Civil Service (Medical): No    Lack of Transportation (Non-Medical): No  Physical Activity: Inactive (03/31/2024)   Exercise Vital Sign    Days of Exercise per Week: 0 days    Minutes of Exercise per Session: 0 min  Stress: Stress Concern Present (03/31/2024)   [employer] of Occupational Health - Occupational Stress Questionnaire    Feeling of Stress: Very much  Social Connections: Moderately Isolated (03/31/2024)   Social Connection and Isolation Panel    Frequency of Communication with Friends and Family: More than three times a week    Frequency of Social Gatherings with Friends and Family: Never    Attends  Religious Services: Never    Database administrator or Organizations: No    Attends Engineer, structural: Never    Marital Status: Married    Tobacco Counseling Counseling given: Not Answered Tobacco comments: started smoking at [employer] 16, and quit at age 46    Clinical Intake:  Pre-visit preparation completed: Yes  Pain : No/denies pain     BMI - recorded: 35.62 Nutritional Status: BMI > 30  Obese Nutritional Risks: Nausea/ vomitting/ diarrhea Diabetes: Yes CBG done?: No (FBS 187 per patient) Did pt. bring in CBG monitor from home?: No  Lab Results  Component Value Date   HGBA1C 6.2 (A) 03/10/2024   HGBA1C 8.3 (A) 11/26/2023   HGBA1C 9.3 (A) 08/06/2023     How often do you need to have someone help you when you read instructions, pamphlets, or other written materials from your doctor or pharmacy?: 1 - Never  Interpreter Needed?: No  Information entered by :: Vina Ned, CMA   Activities of Daily Living     03/31/2024    1:56 PM  In your present state of health, do you have any difficulty performing the following activities:  Hearing? 0  Vision? 0  Difficulty concentrating or making decisions? 1  Walking or climbing stairs? 0  Dressing or bathing? 0  Doing errands, shopping? 0  Preparing Food and eating ? N  Using the Toilet? N  In the past six months, have you accidently leaked urine? N  Do you have problems with loss of bowel control? N  Managing your Medications? N  Managing your Finances? N  Housekeeping or managing your Housekeeping? N    Patient Care Team: Gasper Nancyann BRAVO, MD as PCP - General (Family Medicine) Laurice Francis NOVAK, OD (Optometry) Sherrine Pao, LCSW as Counselor (Licensed Clinical Social Worker) Perla, Evalene PARAS, MD as Consulting Physician (Cardiology) Bettie Bernardino Dover, MD as Referring Physician (Urology) Effie Evalene PARAS, DC as Referring Physician (Chiropractic Medicine)  I have updated  your Care Teams any recent  Medical Services you may have received from other providers in the past year.     Assessment:   This is a routine wellness examination for [place].  Hearing/Vision screen Hearing Screening - Comments:: Denies hearing loss  Vision Screening - Comments:: Gets DM eye exams, Dr. Francis Mallick, [place]   Goals Addressed             This Visit's Progress    Patient Stated       Continue to work on controlling blood sugars and losing weight       Depression Screen     03/31/2024    2:04 PM 01/14/2024    1:18 PM 11/26/2023    8:25 AM 09/16/2023   11:21 AM 08/06/2023   10:49 AM 04/18/2023    8:16 AM 02/26/2023   11:36 AM  PHQ 2/9 Scores  PHQ - 2 Score 6 4 4 5 6 5 4   PHQ- 9 Score 17 20 18 20 24 18 17     Fall Risk     03/31/2024    2:12 PM 11/26/2023    8:25 AM 09/16/2023   11:21 AM 08/06/2023   10:49 AM 04/18/2023    8:16 AM  Fall Risk   Falls in the past year? 0 1 0 0 0  Number falls in past yr: 0 0 0 0 0  Injury with Fall? 0 0 0 0 0  Risk for fall due to : No Fall Risks No Fall Risks  Other (Comment) No Fall Risks  Follow up Falls evaluation completed    Falls evaluation completed    MEDICARE RISK AT HOME:  Medicare Risk at Home Any stairs in or around the home?: Yes If so, are there any without handrails?: Yes Home free of loose throw rugs in walkways, pet beds, electrical cords, etc?: Yes Adequate lighting in your home to reduce risk of falls?: Yes Life alert?: No Use of a cane, walker or w/c?: No Grab bars in the bathroom?: No Shower chair or bench in shower?: No Elevated toilet seat or a handicapped toilet?: No  TIMED UP AND GO:  Was the test performed?  No  Cognitive Function: 6CIT completed        03/31/2024    2:12 PM  6CIT Screen  What Year? 0 points  What month? 0 points  What time? 0 points  Count back from 20 0 points  Months in reverse 0 points  Repeat phrase 0 points  Total Score 0 points    Immunizations Immunization History   Administered Date(s) Administered   Influenza Inj Mdck Quad Pf 02/28/2018, 02/21/2022   Influenza Split 05/26/2012   Influenza, Seasonal, Injecte, Preservative Fre 02/26/2023, 03/10/2024   Influenza,inj,Quad PF,6+ Mos 03/22/2017, 02/17/2019, 02/08/2020, 03/05/2021   Influenza-Unspecified 03/20/2016, 03/24/2017   PFIZER Comirnaty(Gray Top)Covid-19 Tri-Sucrose Vaccine 08/11/2020   PFIZER(Purple Top)SARS-COV-2 Vaccination 09/09/2019, 10/05/2019   Tdap 05/23/2016    Screening Tests Health Maintenance  Topic Date Due   Pneumococcal Vaccine (1 of 2 - PCV) Never done   Hepatitis B Vaccines 19-59 Average Risk (1 of 3 - 19+ 3-dose series) Never done   HPV VACCINES (1 - 3-dose SCDM series) Never done   Colonoscopy  Never done   COVID-19 Vaccine (4 - 2025-26 season) 02/16/2024   OPHTHALMOLOGY EXAM  03/23/2024   Diabetic kidney evaluation - eGFR measurement  08/10/2024   HEMOGLOBIN A1C  09/07/2024   Diabetic kidney evaluation - Urine ACR  11/25/2024  Medicare Annual Wellness (AWV)  03/31/2025   DTaP/Tdap/Td (2 - Td or Tdap) 05/23/2026   Influenza Vaccine  Completed   Hepatitis C Screening  Completed   HIV Screening  Completed   Meningococcal B Vaccine  Aged Out    Health Maintenance Items Addressed: Vaccines Due: Covid vaccine, Referral sent to GI for colonoscopy, See Nurse Notes at the end of this note  Additional Screening:  Vision Screening: Recommended annual ophthalmology exams for early detection of glaucoma and other disorders of the eye. Is the patient up to date with their annual eye exam?  Yes  Who is the provider or what is the name of the office in which the patient attends annual eye exams? Dr. Francis Mallick, [place]  Dental Screening: Recommended annual dental exams for proper oral hygiene  Community Resource Referral / Chronic Care Management: CRR required this visit?  No   CCM required this visit?  No   Plan:    I have personally reviewed and noted the  following in the patient's chart:   Medical and social history Use of alcohol, tobacco or illicit drugs  Current medications and supplements including opioid prescriptions. Patient is currently taking opioid prescriptions. Information provided to patient regarding non-opioid alternatives. Patient advised to discuss non-opioid treatment plan with their provider. Functional ability and status Nutritional status Physical activity Advanced directives List of other physicians Hospitalizations, surgeries, and ER visits in previous 12 months Vitals Screenings to include cognitive, depression, and falls Referrals and appointments  In addition, I have reviewed and discussed with patient certain preventive protocols, quality metrics, and best practice recommendations. A written personalized care plan for preventive services as well as general preventive health recommendations were provided to patient.   Vina Ned, CMA   03/31/2024   After Visit Summary: (MyChart) Due to this being a telephonic visit, the after visit summary with patients personalized plan was offered to patient via MyChart   Notes:  FBS this morning per patient was 187 Placed referral to [facility] for screening colonoscopy Due for DM eye exam. Patient to call and schedule. May want Covid vaccine Declined DM & Nutrition Education referral Declined Hep C vaccines

## 2024-04-07 ENCOUNTER — Other Ambulatory Visit: Payer: Self-pay

## 2024-04-07 ENCOUNTER — Telehealth: Payer: Self-pay

## 2024-04-07 DIAGNOSIS — Z1211 Encounter for screening for malignant neoplasm of colon: Secondary | ICD-10-CM

## 2024-04-07 MED ORDER — NA SULFATE-K SULFATE-MG SULF 17.5-3.13-1.6 GM/177ML PO SOLN: 1.0000 | Freq: Once | ORAL | 0 refills | Status: AC

## 2024-04-07 NOTE — Telephone Encounter (Signed)
 Gastroenterology Pre-Procedure Review  Request Date: 06/26/23 Requesting Physician: Dr. Melany  PATIENT REVIEW QUESTIONS: The patient responded to the following health history questions as indicated:    1. Are you having any GI issues? no 2. Do you have a personal history of Polyps? no 3. Do you have a family history of Colon Cancer or Polyps? no 4. Diabetes Mellitus? yes (has been advised verbally and noted on instructions when to stop diabetic meds  insulin , metformin , actos , mounjaro  ) 5. Joint replacements in the past 12 months?no 6. Major health problems in the past 3 months?no 7. Any artificial heart valves, MVP, or defibrillator?no    MEDICATIONS & ALLERGIES:    Patient reports the following regarding taking any anticoagulation/antiplatelet therapy:   Plavix, Coumadin, Eliquis, Xarelto, Lovenox , Pradaxa, Brilinta, or Effient? no Aspirin? no  Patient confirms/reports the following medications:  Current Outpatient Medications  Medication Sig Dispense Refill   alfuzosin  (UROXATRAL ) 10 MG 24 hr tablet TAKE 1 TABLET (10 MG TOTAL) BY MOUTH DAILY WITH BREAKFAST. 90 tablet 3   ALPRAZolam  (XANAX ) 0.5 MG tablet TAKE 1 TABLET BY MOUTH EVERY 4 HOURS AS NEEDED FOR ANXIETY 30 tablet 1   ARIPiprazole  (ABILIFY ) 2 MG tablet Take 1 tablet (2 mg total) by mouth daily. 30 tablet 2   blood glucose meter kit and supplies KIT Dispense based on patient and insurance preference. Use up to four times daily as directed. (FOR ICD-9 250.00, 250.01). 1 each 0   Continuous Glucose Receiver (FREESTYLE LIBRE 3 READER) DEVI Use to check sugar up to four times a day as needed for uncontrolled insulin  dependent type 2 diabetes. 1 each 1   Continuous Glucose Sensor (FREESTYLE LIBRE 3 PLUS SENSOR) MISC Use to check blood sugar as needed for type 2 diabetes. Change sensor every 15 days. 6 each 3   finasteride  (PROSCAR ) 5 MG tablet TAKE 1 TABLET (5 MG TOTAL) BY MOUTH DAILY. 90 tablet 2   fluconazole  (DIFLUCAN ) 150 MG  tablet TAKE 1 TABLET BY MOUTH AS ONE DOSE (Patient not taking: Reported on 03/31/2024) 1 tablet 3   insulin  aspart (NOVOLOG  FLEXPEN) 100 UNIT/ML FlexPen Inject 16 Units into the skin 2 (two) times daily.     Insulin  Pen Needle (EMBECTA PEN NEEDLE NANO 2 GEN) 32G X 4 MM MISC USE AS DIRECTED 4 TIMES A DAY 360 each 3   lamoTRIgine  (LAMICTAL ) 200 MG tablet Take 2 tablets (400 mg total) by mouth daily. 60 tablet 2   LANTUS  SOLOSTAR 100 UNIT/ML Solostar Pen Inject 16 Units into the skin daily.     meloxicam  (MOBIC ) 15 MG tablet Take 1 tablet (15 mg total) by mouth daily. (Patient not taking: Reported on 03/31/2024) 44 tablet 0   metFORMIN  (GLUCOPHAGE -XR) 500 MG 24 hr tablet TAKE 2 TABLETS BY MOUTH TWICE A DAY 120 tablet 12   omeprazole  (PRILOSEC) 40 MG capsule TAKE 1 CAPSULE BY MOUTH EVERY DAY 30 capsule 5   oxyCODONE -acetaminophen  (PERCOCET) 7.5-325 MG tablet Take 1 tablet by mouth every 4 (four) hours as needed. 60 tablet 0   pioglitazone  (ACTOS ) 30 MG tablet TAKE 1 TABLET BY MOUTH EVERY DAY 30 tablet 5   pregabalin  (LYRICA ) 75 MG capsule Take 1 capsule (75 mg total) by mouth 2 (two) times daily. (Patient not taking: Reported on 03/31/2024) 60 capsule 3   prochlorperazine  (COMPAZINE ) 10 MG tablet TAKE 1/2 - 1 TABLETS BY MOUTH EVERY 6 (SIX) HOURS AS NEEDED FOR NAUSEA OR VOMITING. 90 tablet 2   promethazine  (PHENERGAN ) 25 MG  tablet Take 25 mg by mouth every 6 (six) hours as needed for nausea or vomiting. (Patient not taking: Reported on 03/31/2024)     rosuvastatin  (CRESTOR ) 20 MG tablet TAKE 1 TABLET BY MOUTH EVERY DAY 90 tablet 4   sildenafil  (VIAGRA ) 50 MG tablet Take 1 tablet (50 mg total) by mouth daily as needed for erectile dysfunction. 30 tablet 5   tirzepatide  (MOUNJARO ) 5 MG/0.5ML Pen Inject 5 mg into the skin once a week. 2 mL 2   Vilazodone  HCl (VIIBRYD ) 40 MG TABS Take 1 tablet (40 mg total) by mouth daily. 30 tablet 2   No current facility-administered medications for this visit.     Patient confirms/reports the following allergies:  Allergies  Allergen Reactions   Bupropion      Extreme anxiety   Morphine  Other (See Comments) and Nausea Only    Other Reaction(s): Other (See Comments), unknown    No orders of the defined types were placed in this encounter.   AUTHORIZATION INFORMATION Primary Insurance: 1D#: Group #:  Secondary Insurance: 1D#: Group #:  SCHEDULE INFORMATION: Date: 06/25/24 Time: Location: [facility]

## 2024-04-09 ENCOUNTER — Other Ambulatory Visit: Payer: Self-pay | Admitting: Family Medicine

## 2024-04-09 DIAGNOSIS — G8929 Other chronic pain: Secondary | ICD-10-CM

## 2024-04-09 NOTE — Telephone Encounter (Signed)
 Copied from CRM #[id number]. Topic: Clinical - Medication Refill >> Apr 09, 2024  9:44 AM Vanessa G wrote: Medication: oxyCODONE -acetaminophen  (PERCOCET) 7.5-325 MG tablet  Has the patient contacted their pharmacy? Yes, referred tp provider (Agent: If no, request that the patient contact the pharmacy for the refill. If patient does not wish to contact the pharmacy document the reason why and proceed with request.) (Agent: If yes, when and what did the pharmacy advise?)  This is the patient's preferred pharmacy:  [facility] [phone] [facility],  - [address] [place] [address] Phone: [phone] Fax: [phone]  Is this the correct pharmacy for this prescription? Yes If no, delete pharmacy and type the correct one.   Has the prescription been filled recently? Yes  Is the patient out of the medication? No  Has the patient been seen for an appointment in the last year OR does the patient have an upcoming appointment? Yes  Can we respond through MyChart? Yes  Agent: Please be advised that Rx refills may take up to 3 business days. We ask that you follow-up with your pharmacy.

## 2024-04-10 NOTE — Telephone Encounter (Signed)
 Requested medication (s) are due for refill today: Yes  Requested medication (s) are on the active medication list: Yes  Last refill:  03/30/24  Future visit scheduled: Yes  Notes to clinic:  Unable to refill per protocol, cannot delegate.      Requested Prescriptions  Pending Prescriptions Disp Refills   oxyCODONE -acetaminophen  (PERCOCET) 7.5-325 MG tablet 60 tablet 0    Sig: Take 1 tablet by mouth every 4 (four) hours as needed.     Not Delegated - Analgesics:  Opioid Agonist Combinations Failed - 04/10/2024  9:57 PM      Failed - This refill cannot be delegated      Failed - Urine Drug Screen completed in last 360 days      Passed - Valid encounter within last 3 months    Recent Outpatient Visits           1 month ago Type 2 diabetes mellitus without complication, without long-term current use of insulin  (HCC)   [place] [facility] Gasper Nancyann BRAVO, MD   2 months ago High risk medication use   [place] [facility] Gasper Nancyann BRAVO, MD   4 months ago Type 2 diabetes mellitus without complication, without long-term current use of insulin  [facility])   [place] [facility] Gasper Nancyann BRAVO, MD   5 months ago Urinary retention   [facility] Gasper Nancyann BRAVO, MD   6 months ago Urinary tract infection without hematuria, site unspecified   [facility] Gasper Nancyann BRAVO, MD

## 2024-04-12 NOTE — Telephone Encounter (Signed)
 Pt called to follow up on this medication refill. He is out and needs it. Please advise him when its called in.

## 2024-04-13 DIAGNOSIS — M5414 Radiculopathy, thoracic region: Secondary | ICD-10-CM | POA: Diagnosis not present

## 2024-04-13 DIAGNOSIS — M9903 Segmental and somatic dysfunction of lumbar region: Secondary | ICD-10-CM | POA: Diagnosis not present

## 2024-04-13 DIAGNOSIS — M62830 Muscle spasm of back: Secondary | ICD-10-CM | POA: Diagnosis not present

## 2024-04-13 DIAGNOSIS — M9902 Segmental and somatic dysfunction of thoracic region: Secondary | ICD-10-CM | POA: Diagnosis not present

## 2024-04-13 MED ORDER — OXYCODONE-ACETAMINOPHEN 7.5-325 MG PO TABS: 1.0000 | ORAL_TABLET | ORAL | 0 refills | Status: DC | PRN

## 2024-04-19 ENCOUNTER — Ambulatory Visit
Admission: RE | Admit: 2024-04-19 | Discharge: 2024-04-19 | Disposition: A | Discharge status: Home / Self Care | Attending: Family Medicine | Admitting: Family Medicine

## 2024-04-19 ENCOUNTER — Ambulatory Visit
Admission: RE | Admit: 2024-04-19 | Discharge: 2024-04-19 | Disposition: A | Discharge status: Home / Self Care | Source: Ambulatory Visit | Attending: Family Medicine | Admitting: Family Medicine

## 2024-04-19 ENCOUNTER — Encounter: Payer: Self-pay | Admitting: Family Medicine

## 2024-04-19 ENCOUNTER — Ambulatory Visit (INDEPENDENT_AMBULATORY_CARE_PROVIDER_SITE_OTHER): Admitting: Family Medicine

## 2024-04-19 VITALS — BP 122/75 | HR 76 | Resp 16 | Ht 73.0 in | Wt 273.3 lb

## 2024-04-19 DIAGNOSIS — M5489 Other dorsalgia: Secondary | ICD-10-CM | POA: Diagnosis not present

## 2024-04-19 DIAGNOSIS — M5136 Other intervertebral disc degeneration, lumbar region with discogenic back pain only: Secondary | ICD-10-CM | POA: Diagnosis not present

## 2024-04-19 DIAGNOSIS — M5126 Other intervertebral disc displacement, lumbar region: Secondary | ICD-10-CM | POA: Diagnosis not present

## 2024-04-19 DIAGNOSIS — M722 Plantar fascial fibromatosis: Secondary | ICD-10-CM | POA: Diagnosis not present

## 2024-04-19 DIAGNOSIS — M47816 Spondylosis without myelopathy or radiculopathy, lumbar region: Secondary | ICD-10-CM | POA: Diagnosis not present

## 2024-04-19 MED ORDER — PREDNISONE 10 MG PO TABS: ORAL_TABLET | ORAL | 0 refills | Status: AC

## 2024-04-19 NOTE — Progress Notes (Signed)
 Established patient visit   Patient: Raymond Rose   DOB: [birth year]   47 y.o. Male  MRN: [id number] Visit Date: 04/19/2024  Today's healthcare provider: Nancyann Perry, MD   Chief Complaint  Patient presents with   Pain    Pain management   Plantar Fasciitis   Subjective    Discussed the use of AI scribe software for clinical note transcription with the patient, who gave verbal consent to proceed.  History of Present Illness   Raymond Rose is a 48 year old male with a history of plantar fasciitis who presents with right heel pain.  He has been experiencing a flare-up of plantar fasciitis in his right foot, which began a week ago after playing golf. He felt fine during the activity, but an hour after returning home, he could barely walk. He has a history of plantar fasciitis, though it has been several years since the last occurrence, which affected his left heel. Currently, he is managing the pain by rolling his foot on a frozen ice bottle and using a sling to hold his toes back while sleeping. Previously, a course of steroids helped resolve the issue.  The pain is located at the bottom of the right heel and worsens as the day progresses. Interestingly, the pain is less severe upon waking in the morning. He has not sought treatment from a podiatrist or received cortisone shots for this condition in the past.  In addition to the heel pain, he experiences chronic pain in his hips and lower back, which he describes as constant and not alleviated by weight loss. He has been seeing a land. The pain in his hips is bilateral and located on the sides, not in the groin, and it improves with activity such as playing golf but worsens after sitting. He has had x-rays of his hips five years ago. He has been taking pain medication for this issue.  No pain in arms and legs, except for hips and lower back. No pain in calves.     Office Visit on 03/10/2024  Component Date  Value Ref Range Status   Hemoglobin A1C 03/10/2024 6.2 (A)  4.0 - 5.6 % Final   Est. average glucose Bld gHb Est-m* 03/10/2024 131   Final   Anti Nuclear Antibody (ANA) 03/10/2024 Negative  Negative Final   Cyclic Citrullin Peptide Ab 90/75/7974 7  0 - 19 units Final   Comment:                           Negative               <20                           Weak positive      20 - 39                           Moderate positive  40 - 59                           Strong positive        >59    Rheumatoid fact SerPl-aCnc 03/10/2024 <10.0  <14.0 IU/mL Final   Sed Rate 03/10/2024 13  0 - 15 mm/hr Final   Total CK 03/10/2024 79  49 - 439  U/L Final     Medications: Outpatient Medications Prior to Visit  Medication Sig   alfuzosin  (UROXATRAL ) 10 MG 24 hr tablet TAKE 1 TABLET (10 MG TOTAL) BY MOUTH DAILY WITH BREAKFAST.   ALPRAZolam  (XANAX ) 0.5 MG tablet TAKE 1 TABLET BY MOUTH EVERY 4 HOURS AS NEEDED FOR ANXIETY   ARIPiprazole  (ABILIFY ) 2 MG tablet Take 1 tablet (2 mg total) by mouth daily.   blood glucose meter kit and supplies KIT Dispense based on patient and insurance preference. Use up to four times daily as directed. (FOR ICD-9 250.00, 250.01).   Continuous Glucose Receiver (FREESTYLE LIBRE 3 READER) DEVI Use to check sugar up to four times a day as needed for uncontrolled insulin  dependent type 2 diabetes.   Continuous Glucose Sensor (FREESTYLE LIBRE 3 PLUS SENSOR) MISC Use to check blood sugar as needed for type 2 diabetes. Change sensor every 15 days.   finasteride  (PROSCAR ) 5 MG tablet TAKE 1 TABLET (5 MG TOTAL) BY MOUTH DAILY.   fluconazole  (DIFLUCAN ) 150 MG tablet TAKE 1 TABLET BY MOUTH AS ONE DOSE   insulin  aspart (NOVOLOG  FLEXPEN) 100 UNIT/ML FlexPen Inject 16 Units into the skin 2 (two) times daily.   Insulin  Pen Needle (EMBECTA PEN NEEDLE NANO 2 GEN) 32G X 4 MM MISC USE AS DIRECTED 4 TIMES A DAY   lamoTRIgine  (LAMICTAL ) 200 MG tablet Take 2 tablets (400 mg total) by mouth daily.    LANTUS  SOLOSTAR 100 UNIT/ML Solostar Pen Inject 16 Units into the skin daily.   meloxicam  (MOBIC ) 15 MG tablet Take 1 tablet (15 mg total) by mouth daily.   metFORMIN  (GLUCOPHAGE -XR) 500 MG 24 hr tablet TAKE 2 TABLETS BY MOUTH TWICE A DAY   omeprazole  (PRILOSEC) 40 MG capsule TAKE 1 CAPSULE BY MOUTH EVERY DAY   oxyCODONE -acetaminophen  (PERCOCET) 7.5-325 MG tablet Take 1 tablet by mouth every 4 (four) hours as needed.   pioglitazone  (ACTOS ) 30 MG tablet TAKE 1 TABLET BY MOUTH EVERY DAY   pregabalin  (LYRICA ) 75 MG capsule Take 1 capsule (75 mg total) by mouth 2 (two) times daily.   prochlorperazine  (COMPAZINE ) 10 MG tablet TAKE 1/2 - 1 TABLETS BY MOUTH EVERY 6 (SIX) HOURS AS NEEDED FOR NAUSEA OR VOMITING.   promethazine  (PHENERGAN ) 25 MG tablet Take 25 mg by mouth every 6 (six) hours as needed for nausea or vomiting.   rosuvastatin  (CRESTOR ) 20 MG tablet TAKE 1 TABLET BY MOUTH EVERY DAY   sildenafil  (VIAGRA ) 50 MG tablet Take 1 tablet (50 mg total) by mouth daily as needed for erectile dysfunction.   tirzepatide  (MOUNJARO ) 5 MG/0.5ML Pen Inject 5 mg into the skin once a week.   Vilazodone  HCl (VIIBRYD ) 40 MG TABS Take 1 tablet (40 mg total) by mouth daily.   No facility-administered medications prior to visit.        Objective    BP 122/75 (BP Location: Right Arm, Patient Position: Sitting, Cuff Size: Normal)   Pulse 76   Resp 16   Ht 6' 1 (1.854 m)   Wt 273 lb 4.8 oz (124 kg)   SpO2 99%   BMI 36.06 kg/m   Physical Exam  General appearance: Obese male, cooperative and in no acute distress Head: Normocephalic, without obvious abnormality, atraumatic Respiratory: Respirations even and unlabored, normal respiratory rate Extremities: All extremities are intact.. Tender plantar aspect right posterior heel. No swelling, no erythema.   Skin: Skin color, texture, turgor normal. No rashes seen  Psych: Appropriate mood and affect. Neurologic: Mental status: Alert,  oriented to person,  place, and time, thought content appropriate.     Assessment & Plan        Right heel plantar fasciitis Acute flare with pain localized to heel, worsening with standing. Previous episodes responded to steroid taper. - Prescribe prednisone  12-day taper for inflammation.  Chronic lower back pain with possible spinal arthritis Persistent pain with history of spinal arthritis and bone spurs. Pain improves with activity, worsens after rest. Previous imaging showed arthritis and bone spurs. - Order follow-up x-rays of the spine to assess for changes in arthritis or bone spurs.  Bilateral hip pain likely referred from spine Chronic bilateral hip pain likely referred from spinal issues. Pain improves with activity, worsens after rest. Previous imaging showed arthritis and bone spurs in the spine. - Order follow-up x-rays of the spine to assess for changes in arthritis or bone spurs.         Nancyann Perry, MD  [facility] [phone] (phone) [phone] (fax)  [facility]

## 2024-04-22 ENCOUNTER — Other Ambulatory Visit: Payer: Self-pay | Admitting: Family Medicine

## 2024-04-22 DIAGNOSIS — G8929 Other chronic pain: Secondary | ICD-10-CM

## 2024-04-22 NOTE — Telephone Encounter (Signed)
 Copied from CRM #[id number]. Topic: Clinical - Medication Refill >> Apr 22, 2024 11:44 AM Wess RAMAN wrote: Medication: oxyCODONE -acetaminophen  (PERCOCET) 7.5-325 MG tablet   Has the patient contacted their pharmacy? No (Agent: If no, request that the patient contact the pharmacy for the refill. If patient does not wish to contact the pharmacy document the reason why and proceed with request.) (Agent: If yes, when and what did the pharmacy advise?)  This is the patient's preferred pharmacy:  [phone] [place] - [address] [place] [address] Phone: [phone] Fax: [phone]  Is this the correct pharmacy for this prescription? Yes If no, delete pharmacy and type the correct one.   Has the prescription been filled recently? Yes  Is the patient out of the medication? Yes  Has the patient been seen for an appointment in the last year OR does the patient have an upcoming appointment? Yes  Can we respond through MyChart? Yes  Agent: Please be advised that Rx refills may take up to 3 business days. We ask that you follow-up with your pharmacy.

## 2024-04-23 NOTE — Telephone Encounter (Signed)
 Requested medication (s) are due for refill today - provider review   Requested medication (s) are on the active medication list -yes  Future visit scheduled -yes  Last refill: 04/13/24 #60  Notes to clinic: non delegated Rx  Requested Prescriptions  Pending Prescriptions Disp Refills   oxyCODONE -acetaminophen  (PERCOCET) 7.5-325 MG tablet 60 tablet 0    Sig: Take 1 tablet by mouth every 4 (four) hours as needed.     Not Delegated - Analgesics:  Opioid Agonist Combinations Failed - 04/23/2024  2:59 PM      Failed - This refill cannot be delegated      Failed - Urine Drug Screen completed in last 360 days      Passed - Valid encounter within last 3 months    Recent Outpatient Visits           4 days ago Midline back pain, unspecified back location, unspecified chronicity   [place] [facility] Nancyann BRAVO, [facility]   1 month ago Type 2 diabetes mellitus without complication, without long-term current use of insulin  [facility])   [place] [facility] Nancy[facility] BRAVO, MD   3 months ago High risk medication use   [place] [facility] Nancyann BRAVO, MD   4 months ago Type 2 diabetes mellitus without complication, without long-term current use of insulin  [facility])   [place] [facility] Nancyann BRAVO, MD   6 months ago Urinary retention   [facility] Nancyann BRAVO, MD                 Requested Prescriptions  Pending Prescriptions Disp Refills   oxyCODONE -acetaminophen  (PERCOCET) 7.5-325 MG tablet 60 tablet 0    Sig: Take 1 tablet by mouth every 4 (four) hours as needed.     Not Delegated - Analgesics:  Opioid Agonist Combinations Failed - 04/23/2024  2:59 PM      Failed - This refill cannot be delegated      Failed - Urine Drug Screen completed in last 360 days      Passed - Valid encounter within last 3 months    Recent Outpatient Visits           4 days ago Midline  back pain, unspecified back location, unspecified chronicity   [place] [facility] Nancyann BRAVO, MD   1 month ago Type 2 diabetes mellitus without complication, without long-term current use of insulin  [facility])   [place] [facility] Nancyann BRAVO, MD   3 months ago High risk medication use   [place] [facility] Nancyann BRAVO, MD   4 months ago Type 2 diabetes mellitus without complication, without long-term current use of insulin  [facility])   [place] [facility] Nancyann BRAVO, MD   6 months ago Urinary retention   [facility] Nancyann BRAVO, MD

## 2024-04-26 ENCOUNTER — Other Ambulatory Visit: Payer: Self-pay | Admitting: Family Medicine

## 2024-04-26 MED ORDER — OXYCODONE-ACETAMINOPHEN 7.5-325 MG PO TABS
1.0000 | ORAL_TABLET | ORAL | 0 refills | Status: DC | PRN
Start: 2024-04-26 — End: 2024-05-10

## 2024-04-28 ENCOUNTER — Other Ambulatory Visit: Payer: Self-pay | Admitting: Family Medicine

## 2024-04-28 DIAGNOSIS — E119 Type 2 diabetes mellitus without complications: Secondary | ICD-10-CM

## 2024-04-30 ENCOUNTER — Ambulatory Visit: Admitting: Family Medicine

## 2024-05-02 ENCOUNTER — Other Ambulatory Visit: Payer: Self-pay | Admitting: Family Medicine

## 2024-05-02 DIAGNOSIS — R112 Nausea with vomiting, unspecified: Secondary | ICD-10-CM

## 2024-05-10 ENCOUNTER — Ambulatory Visit: Admitting: Family Medicine

## 2024-05-10 ENCOUNTER — Encounter: Payer: Self-pay | Admitting: Family Medicine

## 2024-05-10 VITALS — BP 129/67 | HR 79 | Ht 73.0 in | Wt 270.3 lb

## 2024-05-10 DIAGNOSIS — M722 Plantar fascial fibromatosis: Secondary | ICD-10-CM

## 2024-05-10 DIAGNOSIS — E119 Type 2 diabetes mellitus without complications: Secondary | ICD-10-CM

## 2024-05-10 DIAGNOSIS — M545 Low back pain, unspecified: Secondary | ICD-10-CM

## 2024-05-10 DIAGNOSIS — M5414 Radiculopathy, thoracic region: Secondary | ICD-10-CM | POA: Diagnosis not present

## 2024-05-10 DIAGNOSIS — G894 Chronic pain syndrome: Secondary | ICD-10-CM

## 2024-05-10 DIAGNOSIS — M4810 Ankylosing hyperostosis [Forestier], site unspecified: Secondary | ICD-10-CM

## 2024-05-10 DIAGNOSIS — M62830 Muscle spasm of back: Secondary | ICD-10-CM | POA: Diagnosis not present

## 2024-05-10 DIAGNOSIS — M9902 Segmental and somatic dysfunction of thoracic region: Secondary | ICD-10-CM | POA: Diagnosis not present

## 2024-05-10 DIAGNOSIS — M5489 Other dorsalgia: Secondary | ICD-10-CM

## 2024-05-10 DIAGNOSIS — M25551 Pain in right hip: Secondary | ICD-10-CM

## 2024-05-10 DIAGNOSIS — M9903 Segmental and somatic dysfunction of lumbar region: Secondary | ICD-10-CM | POA: Diagnosis not present

## 2024-05-10 MED ORDER — PREDNISONE 10 MG PO TABS: ORAL_TABLET | ORAL | 0 refills | Status: AC

## 2024-05-10 MED ORDER — OXYCODONE-ACETAMINOPHEN 7.5-325 MG PO TABS: 1.0000 | ORAL_TABLET | ORAL | 0 refills | Status: DC | PRN

## 2024-05-10 MED ORDER — LANTUS SOLOSTAR 100 UNIT/ML ~~LOC~~ SOPN: 8.0000 [IU] | PEN_INJECTOR | Freq: Every day | SUBCUTANEOUS | Status: AC

## 2024-05-11 ENCOUNTER — Ambulatory Visit: Payer: Self-pay | Admitting: Family Medicine

## 2024-05-20 ENCOUNTER — Ambulatory Visit

## 2024-05-20 ENCOUNTER — Other Ambulatory Visit: Payer: Self-pay | Admitting: Family Medicine

## 2024-05-20 DIAGNOSIS — G8929 Other chronic pain: Secondary | ICD-10-CM

## 2024-05-20 NOTE — Telephone Encounter (Unsigned)
 Copied from CRM #[id number]. Topic: Clinical - Medication Refill >> May 20, 2024 10:46 AM Travis F wrote: Medication: oxyCODONE -acetaminophen  (PERCOCET) 7.5-325 MG tablet [[id number]]  Has the patient contacted their pharmacy? Yes  (Agent: If yes, when and what did the pharmacy advise?) contact office   This is the patient's preferred pharmacy:  [phone] [facility] [place] - [address] [place] [address] Phone: [phone] Fax: [phone]  Is this the correct pharmacy for this prescription? Yes If no, delete pharmacy and type the correct one.   Has the prescription been filled recently? Yes  Is the patient out of the medication? Yes  Has the patient been seen for an appointment in the last year OR does the patient have an upcoming appointment? Yes  Can we respond through MyChart? Yes  Agent: Please be advised that Rx refills may take up to 3 business days. We ask that you follow-up with your pharmacy.

## 2024-05-22 NOTE — Telephone Encounter (Signed)
 Requested medications are due for refill today.  Pt given a 10 day supply 05/10/2024 #60 0 rf  Requested medications are on the active medications list.  yes  Last refill. 05/10/2024 #60 0 rf  Future visit scheduled.   yes  Notes to clinic.  Refill not delegated.    Requested Prescriptions  Pending Prescriptions Disp Refills   oxyCODONE -acetaminophen  (PERCOCET) 7.5-325 MG tablet 60 tablet 0    Sig: Take 1 tablet by mouth every 4 (four) hours as needed.     Not Delegated - Analgesics:  Opioid Agonist Combinations Failed - 05/22/2024  8:32 PM      Failed - This refill cannot be delegated      Failed - Urine Drug Screen completed in last 360 days      Passed - Valid encounter within last 3 months    Recent Outpatient Visits           1 week ago DISH (diffuse idiopathic skeletal hyperostosis)   [place] [facility] Nancy[facility] BRAVO, [facility]   1 month ago Midline back pain, unspecified back location, unspecified chronicity   [place] [facility] Nancyann BRAVO, MD   2 months ago Type 2 diabetes mellitus without complication, without long-term current use of insulin  [facility])   [place] [facility] Nancyann BRAVO, MD   4 months ago High risk medication use   [place] [facility] Nancyann BRAVO, MD   5 months ago Type 2 diabetes mellitus without complication, without long-term current use of insulin  [facility])   [place] [facility] Nancyann BRAVO, MD

## 2024-05-24 MED ORDER — OXYCODONE-ACETAMINOPHEN 7.5-325 MG PO TABS: 1.0000 | ORAL_TABLET | ORAL | 0 refills | Status: DC | PRN

## 2024-05-25 ENCOUNTER — Ambulatory Visit: Admitting: Podiatry

## 2024-06-01 ENCOUNTER — Telehealth: Payer: Self-pay

## 2024-06-01 NOTE — Telephone Encounter (Signed)
 Voice message has been left notifying patient that his colonoscopy has been rescheduled from 06/25/24 to 07/16/24 due to scope schedule change.  Instructions updated.  Referral updated.  Thanks,  [place], CMA

## 2024-06-02 NOTE — Progress Notes (Signed)
 Established patient visit   Patient: Raymond Rose   DOB: 1976-02-15   48 y.o. Male  MRN: [id number] Visit Date: 05/10/2024  Today's healthcare provider: Nancyann Perry, MD   Chief Complaint  Patient presents with   Follow-up    Pain management and plantar fasciitis.   Subjective    Discussed the use of AI scribe software for clinical note transcription with the patient, who gave verbal consent to proceed.  History of Present Illness   Raymond Rose is a 48 year old male who presents for follow-up of back pain and plantar fasciitis.  He continues to experience discomfort in his right foot despite resting for about two weeks and taking prednisone , which initially provided relief. However, after playing golf last week, the pain returned and has persisted. He describes the sensation as 'stepping on a rock', affecting his ability to roll his foot properly. He is not using any heel cushions or special footwear. It has been three or four years since his last flare-up of plantar fasciitis, and he has not seen a podiatrist before.  Regarding his back, the pain remains unchanged, with more discomfort in his hips than his back. Prednisone  did not improve his back pain. His chiropractor reviewed his x-ray report and noted no significant changes compared to six months ago.  He is currently taking insulin , specifically 16 units of Novolog  three times a day before meals and 16 units of long-term insulin . His blood sugar levels are generally good, but they drop to 50 or 60 overnight, causing alerts to wake him up. He is also on Mounjaro  5 mg.     Lab Results  Component Value Date   HGBA1C 6.2 (A) 03/10/2024   HGBA1C 8.3 (A) 11/26/2023   HGBA1C 9.3 (A) 08/06/2023      Medications: Show/hide medication list[1]      Objective    BP 129/67 (BP Location: Left Arm, Patient Position: Sitting, Cuff Size: Large)   Pulse 79   Ht 6' 1 (1.854 m)   Wt 270 lb 4.8 oz (122.6 kg)    SpO2 100%   BMI 35.66 kg/m   Physical Exam   General appearance: Obese male, cooperative and in no acute distress Head: Normocephalic, without obvious abnormality, atraumatic Respiratory: Respirations even and unlabored, normal respiratory rate Extremities: All extremities are intact.  Skin: Skin color, texture, turgor normal. No rashes seen  Psych: Appropriate mood and affect. Neurologic: Mental status: Alert, oriented to person, place, and time, thought content appropriate.    Assessment & Plan       Plantar fasciitis, right foot Chronic plantar fasciitis with recent exacerbation post-golf. Previous prednisone  provided temporary relief. No podiatrist consultation yet. - Prescribed shorter prednisone  course until podiatrist evaluation. - Referred to podiatrist for further management.  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 Symptoms more pronounced in hips. Previous x-ray consistent with DISH. No significant change with recent prednisone . Potential for progression if untreated. - Referred to orthopedist for further evaluation and management, including potential cortisone injections.  Type 2 diabetes mellitus Recent nocturnal hypoglycemia with blood sugar 50-60 mg/dL. Current insulin  regimen includes Novolog , Lantus , and Mounjaro . - Reduced Lantus  to 8 units to address nocturnal hypoglycemia. - Continue current Novolog  and Mounjaro  regimen.   Chronic low back pain, unspecified back pain laterality, unspecified whether sciatica present  Midline back pain, unspecified back location, unspecified chronicity - Ambulatory referral to Orthopedic Surgery  Chronic pain syndrome Stable on current opioid regiment unchanged for the last few year, no sign of abuse or diversion.   Bilateral hip pain Likely due to or exacerbated by DISH - Ambulatory referral to Orthopedic Surgery  No follow-ups on file.  SABRA Nancyann Perry, MD  [facility] [phone] (phone) [phone] (fax)  [facility]    [1]  Outpatient Medications Prior to Visit  Medication Sig   alfuzosin  (UROXATRAL ) 10 MG 24 hr tablet TAKE 1 TABLET (10 MG TOTAL) BY MOUTH DAILY WITH BREAKFAST.   ALPRAZolam  (XANAX ) 0.5 MG tablet TAKE 1 TABLET BY MOUTH EVERY 4 HOURS AS NEEDED FOR ANXIETY   ARIPiprazole  (ABILIFY ) 2 MG tablet TAKE 1 TABLET BY MOUTH EVERY DAY   blood glucose meter kit and supplies KIT Dispense based on patient and insurance preference. Use up to four times daily as directed. (FOR ICD-9 250.00, 250.01).   Continuous Glucose Receiver (FREESTYLE LIBRE 3 READER) DEVI Use to check sugar up to four times a day as needed for uncontrolled insulin  dependent type 2 diabetes.   Continuous Glucose Sensor (FREESTYLE LIBRE 3 PLUS SENSOR) MISC Use to check blood sugar as needed for type 2 diabetes. Change sensor every 15 days.   finasteride  (PROSCAR ) 5 MG tablet TAKE 1 TABLET (5 MG TOTAL) BY MOUTH DAILY.   fluconazole  (DIFLUCAN ) 150 MG tablet TAKE 1 TABLET BY MOUTH AS ONE DOSE   insulin  aspart (NOVOLOG  FLEXPEN) 100 UNIT/ML FlexPen Inject 16 Units into the skin 2 (two) times daily.   Insulin  Pen Needle (EMBECTA PEN NEEDLE NANO 2 GEN) 32G X 4 MM MISC USE AS DIRECTED 4 TIMES A DAY   lamoTRIgine  (LAMICTAL ) 200 MG tablet Take 2 tablets (400 mg total) by mouth daily.   meloxicam  (MOBIC ) 15 MG tablet Take 1 tablet (15 mg total) by mouth daily.   metFORMIN  (GLUCOPHAGE -XR) 500 MG 24 hr tablet TAKE 2 TABLETS BY MOUTH TWICE A DAY   omeprazole  (PRILOSEC) 40 MG capsule TAKE 1 CAPSULE BY MOUTH EVERY DAY   pioglitazone  (ACTOS ) 30 MG tablet TAKE 1 TABLET BY MOUTH EVERY DAY   pregabalin  (LYRICA ) 75 MG capsule Take 1 capsule (75 mg total) by mouth 2 (two) times daily.   prochlorperazine  (COMPAZINE ) 10 MG tablet TAKE 1/2 - 1 TABLET BY MOUTH EVERY 6 (SIX) HOURS AS NEEDED FOR NAUSEA OR VOMITING.   promethazine   (PHENERGAN ) 25 MG tablet Take 25 mg by mouth every 6 (six) hours as needed for nausea or vomiting.   rosuvastatin  (CRESTOR ) 20 MG tablet TAKE 1 TABLET BY MOUTH EVERY DAY   sildenafil  (VIAGRA ) 50 MG tablet Take 1 tablet (50 mg total) by mouth daily as needed for erectile dysfunction.   tirzepatide  (MOUNJARO ) 5 MG/0.5ML Pen INJECT 5 MG SUBCUTANEOUSLY WEEKLY   Vilazodone  HCl (VIIBRYD ) 40 MG TABS Take 1 tablet (40 mg total) by mouth daily.   [DISCONTINUED] LANTUS  SOLOSTAR 100 UNIT/ML Solostar Pen Inject 16 Units into the skin daily.   [DISCONTINUED] oxyCODONE -acetaminophen  (PERCOCET) 7.5-325 MG tablet Take 1 tablet by mouth every 4 (four) hours as needed.   No facility-administered medications prior to visit.

## 2024-06-05 ENCOUNTER — Other Ambulatory Visit: Payer: Self-pay | Admitting: Family Medicine

## 2024-06-05 DIAGNOSIS — R3911 Hesitancy of micturition: Secondary | ICD-10-CM

## 2024-06-07 ENCOUNTER — Other Ambulatory Visit: Payer: Self-pay | Admitting: Family Medicine

## 2024-06-07 DIAGNOSIS — G8929 Other chronic pain: Secondary | ICD-10-CM

## 2024-06-07 NOTE — Telephone Encounter (Signed)
 Copied from CRM #[id number]. Topic: Clinical - Medication Refill >> Jun 07, 2024 10:19 AM Nessti S wrote: Medication: oxyCODONE -acetaminophen  (PERCOCET) 7.5-325 MG tablet  Has the patient contacted their pharmacy? Yes (Agent: If no, request that the patient contact the pharmacy for the refill. If patient does not wish to contact the pharmacy document the reason why and proceed with request.) (Agent: If yes, when and what did the pharmacy advise?)  This is the patient's preferred pharmacy:  [phone] [facility] [place] - [address] [place] [address] Phone: [phone] Fax: [phone]  Is this the correct pharmacy for this prescription? Yes If no, delete pharmacy and type the correct one.   Has the prescription been filled recently? Yes  Is the patient out of the medication? Yes  Has the patient been seen for an appointment in the last year OR does the patient have an upcoming appointment? Yes  Can we respond through MyChart? Yes  Agent: Please be advised that Rx refills may take up to 3 business days. We ask that you follow-up with your pharmacy.

## 2024-06-09 ENCOUNTER — Other Ambulatory Visit: Payer: Self-pay

## 2024-06-09 DIAGNOSIS — G8929 Other chronic pain: Secondary | ICD-10-CM

## 2024-06-09 MED ORDER — OXYCODONE-ACETAMINOPHEN 7.5-325 MG PO TABS: 1.0000 | ORAL_TABLET | ORAL | 0 refills | Status: DC | PRN

## 2024-06-09 NOTE — Telephone Encounter (Signed)
 LOV 05/10/2024 NOV LRF 05/24/2024 ( 10 DAY SUPPLY)

## 2024-06-09 NOTE — Telephone Encounter (Signed)
 Requested medication (s) are due for refill today - no  Requested medication (s) are on the active medication list -yes  Future visit scheduled -yes  Last refill: 06/09/24 #60  Notes to clinic: duplicate request, non delegated Rx  Requested Prescriptions  Pending Prescriptions Disp Refills   oxyCODONE -acetaminophen  (PERCOCET) 7.5-325 MG tablet 60 tablet 0    Sig: Take 1 tablet by mouth every 4 (four) hours as needed.     Not Delegated - Analgesics:  Opioid Agonist Combinations Failed - 06/09/2024 12:01 PM      Failed - This refill cannot be delegated      Failed - Urine Drug Screen completed in last 360 days      Passed - Valid encounter within last 3 months    Recent Outpatient Visits           1 month ago DISH (diffuse idiopathic skeletal hyperostosis)   [place] [facility] Gasper Nancyann BRAVO, MD   1 month ago Midline back pain, unspecified back location, unspecified chronicity   [place] [facility] Gasper Nancyann BRAVO, MD   3 months ago Type 2 diabetes mellitus without complication, without long-term current use of insulin  [facility])   [place] [facility] Gasper Nancyann BRAVO, MD   4 months ago High risk medication use   [place] [facility] Gasper Nancyann BRAVO, MD   6 months ago Type 2 diabetes mellitus without complication, without long-term current use of insulin  [facility])   [place] [facility] Gasper Nancyann BRAVO, MD                 Requested Prescriptions  Pending Prescriptions Disp Refills   oxyCODONE -acetaminophen  (PERCOCET) 7.5-325 MG tablet 60 tablet 0    Sig: Take 1 tablet by mouth every 4 (four) hours as needed.     Not Delegated - Analgesics:  Opioid Agonist Combinations Failed - 06/09/2024 12:01 PM      Failed - This refill cannot be delegated      Failed - Urine Drug Screen completed in last 360 days      Passed - Valid encounter within last 3 months    Recent Outpatient  Visits           1 month ago DISH (diffuse idiopathic skeletal hyperostosis)   [facility] Nancyann BRAVO, MD   1 month ago Midline back pain, unspecified back location, unspecified chronicity   [place] [facility] Gasper Nancyann BRAVO, MD   3 months ago Type 2 diabetes mellitus without complication, without long-term current use of insulin  [facility])   [place] [facility] Gasper Nancyann BRAVO, MD   4 months ago High risk medication use    [facility] Gas[facility] Nancyann BRAVO, MD   6 months ago Type 2 diabetes mellitus without complication, without long-term current use of insulin  [facility])    [facility] Gasper, Nancyann BRAVO, MD

## 2024-06-14 ENCOUNTER — Telehealth

## 2024-06-14 DIAGNOSIS — R1115 Cyclical vomiting syndrome unrelated to migraine: Secondary | ICD-10-CM

## 2024-06-14 DIAGNOSIS — R051 Acute cough: Secondary | ICD-10-CM

## 2024-06-14 MED ORDER — AZITHROMYCIN 250 MG PO TABS: ORAL_TABLET | ORAL | 0 refills | Status: AC

## 2024-06-14 MED ORDER — BENZONATATE 100 MG PO CAPS: 100.0000 mg | ORAL_CAPSULE | Freq: Two times a day (BID) | ORAL | 0 refills | Status: AC | PRN

## 2024-06-14 NOTE — Patient Instructions (Signed)
 " Raymond Rose, thank you for joining Jon CHRISTELLA Belt, NP for today's virtual visit.  While this provider is not your primary care provider (PCP), if your PCP is located in our provider database this encounter information will be shared with them immediately following your visit.   A [facility] MyChart account gives you access to today's visit and all your visits, tests, and labs performed at [facility]  click here if you don't have a [facility] MyChart account or go to [email or web address]  Consent: (Patient) Raymond Rose provided verbal consent for this virtual visit at the beginning of the encounter.  Current Medications:  Current Outpatient Medications:    azithromycin  (ZITHROMAX ) 250 MG tablet, Take 2 tablets on day 1, then 1 tablet daily on days 2 through 5, Disp: 6 tablet, Rfl: 0   benzonatate  (TESSALON ) 100 MG capsule, Take 1 capsule (100 mg total) by mouth 2 (two) times daily as needed for cough., Disp: 20 capsule, Rfl: 0   alfuzosin  (UROXATRAL ) 10 MG 24 hr tablet, TAKE 1 TABLET (10 MG TOTAL) BY MOUTH DAILY WITH BREAKFAST., Disp: 90 tablet, Rfl: 3   ALPRAZolam  (XANAX ) 0.5 MG tablet, TAKE 1 TABLET BY MOUTH EVERY 4 HOURS AS NEEDED FOR ANXIETY, Disp: 30 tablet, Rfl: 1   ARIPiprazole  (ABILIFY ) 2 MG tablet, TAKE 1 TABLET BY MOUTH EVERY DAY, Disp: 30 tablet, Rfl: 2   blood glucose meter kit and supplies KIT, Dispense based on patient and insurance preference. Use up to four times daily as directed. (FOR ICD-9 250.00, 250.01)., Disp: 1 each, Rfl: 0   Continuous Glucose Receiver (FREESTYLE LIBRE 3 READER) DEVI, Use to check sugar up to four times a day as needed for uncontrolled insulin  dependent type 2 diabetes., Disp: 1 each, Rfl: 1   Continuous Glucose Sensor (FREESTYLE LIBRE 3 PLUS SENSOR) MISC, Use to check blood sugar as needed for type 2 diabetes. Change sensor every 15 days., Disp: 6 each, Rfl: 3   finasteride  (PROSCAR ) 5 MG tablet, TAKE 1 TABLET (5 MG  TOTAL) BY MOUTH DAILY., Disp: 90 tablet, Rfl: 2   fluconazole  (DIFLUCAN ) 150 MG tablet, TAKE 1 TABLET BY MOUTH AS ONE DOSE, Disp: 1 tablet, Rfl: 3   insulin  aspart (NOVOLOG  FLEXPEN) 100 UNIT/ML FlexPen, Inject 16 Units into the skin 2 (two) times daily., Disp: , Rfl:    Insulin  Pen Needle (EMBECTA PEN NEEDLE NANO 2 GEN) 32G X 4 MM MISC, USE AS DIRECTED 4 TIMES A DAY, Disp: 360 each, Rfl: 3   lamoTRIgine  (LAMICTAL ) 200 MG tablet, Take 2 tablets (400 mg total) by mouth daily., Disp: 60 tablet, Rfl: 2   LANTUS  SOLOSTAR 100 UNIT/ML Solostar Pen, Inject 8 Units into the skin daily., Disp: , Rfl:    meloxicam  (MOBIC ) 15 MG tablet, Take 1 tablet (15 mg total) by mouth daily., Disp: 44 tablet, Rfl: 0   metFORMIN  (GLUCOPHAGE -XR) 500 MG 24 hr tablet, TAKE 2 TABLETS BY MOUTH TWICE A DAY, Disp: 120 tablet, Rfl: 12   omeprazole  (PRILOSEC) 40 MG capsule, TAKE 1 CAPSULE BY MOUTH EVERY DAY, Disp: 30 capsule, Rfl: 5   oxyCODONE -acetaminophen  (PERCOCET) 7.5-325 MG tablet, Take 1 tablet by mouth every 4 (four) hours as needed., Disp: 60 tablet, Rfl: 0   pioglitazone  (ACTOS ) 30 MG tablet, TAKE 1 TABLET BY MOUTH EVERY DAY, Disp: 30 tablet, Rfl: 5   pregabalin  (LYRICA ) 75 MG capsule, Take 1 capsule (75 mg total) by mouth 2 (two) times daily., Disp: 60 capsule, Rfl: 3  prochlorperazine  (COMPAZINE ) 10 MG tablet, TAKE 1/2 - 1 TABLET BY MOUTH EVERY 6 (SIX) HOURS AS NEEDED FOR NAUSEA OR VOMITING., Disp: 90 tablet, Rfl: 2   promethazine  (PHENERGAN ) 25 MG tablet, Take 25 mg by mouth every 6 (six) hours as needed for nausea or vomiting., Disp: , Rfl:    rosuvastatin  (CRESTOR ) 20 MG tablet, TAKE 1 TABLET BY MOUTH EVERY DAY, Disp: 90 tablet, Rfl: 4   sildenafil  (VIAGRA ) 50 MG tablet, Take 1 tablet (50 mg total) by mouth daily as needed for erectile dysfunction., Disp: 30 tablet, Rfl: 5   tirzepatide  (MOUNJARO ) 5 MG/0.5ML Pen, INJECT 5 MG SUBCUTANEOUSLY WEEKLY, Disp: 6 mL, Rfl: 1   Vilazodone  HCl (VIIBRYD ) 40 MG TABS, Take 1  tablet (40 mg total) by mouth daily., Disp: 30 tablet, Rfl: 2   Medications ordered in this encounter:  Meds ordered this encounter  Medications   benzonatate  (TESSALON ) 100 MG capsule    Sig: Take 1 capsule (100 mg total) by mouth 2 (two) times daily as needed for cough.    Dispense:  20 capsule    Refill:  0   azithromycin  (ZITHROMAX ) 250 MG tablet    Sig: Take 2 tablets on day 1, then 1 tablet daily on days 2 through 5    Dispense:  6 tablet    Refill:  0     *If you need refills on other medications prior to your next appointment, please contact your pharmacy*  Follow-Up: Call back or seek an in-person evaluation if the symptoms worsen or if the condition fails to improve as anticipated.  [facility] [phone]  Other Instructions If you cannot stay hydrated on your own, if you stay dizzy, or if your cough gets worse and you are feeling short of breath/difficulty breathing, you will need to do to ER for help.    If you have been instructed to have an in-person evaluation today at a local Urgent Care facility, please use the link below. It will take you to a list of all of our available [facility], including address, phone number and hours of operation. Please do not delay care.  [facility]  If you or a family member do not have a primary care provider, use the link below to schedule a visit and establish care. When you choose a [facility] primary care physician or advanced practice provider, you gain a long-term partner in health. Find a Primary Care Provider  Learn more about [facility]'s in-office and virtual care options: [facility] - Get Care Now  "

## 2024-06-14 NOTE — Progress Notes (Signed)
 " Virtual Visit Consent   Raymond Rose, you are scheduled for a virtual visit with a [facility] provider today. Just as with appointments in the office, your consent must be obtained to participate. Your consent will be active for this visit and any virtual visit you may have with one of our providers in the next 365 days. If you have a MyChart account, a copy of this consent can be sent to you electronically.  As this is a virtual visit, video technology does not allow for your provider to perform a traditional examination. This may limit your provider's ability to fully assess your condition. If your provider identifies any concerns that need to be evaluated in person or the need to arrange testing (such as labs, EKG, etc.), we will make arrangements to do so. Although advances in technology are sophisticated, we cannot ensure that it will always work on either your end or our end. If the connection with a video visit is poor, the visit may have to be switched to a telephone visit. With either a video or telephone visit, we are not always able to ensure that we have a secure connection.  By engaging in this virtual visit, you consent to the provision of healthcare and authorize for your insurance to be billed (if applicable) for the services provided during this visit. Depending on your insurance coverage, you may receive a charge related to this service.  I need to obtain your verbal consent now. Are you willing to proceed with your visit today? Raymond Rose has provided verbal consent on 06/14/2024 for a virtual visit (video or telephone). Jon CHRISTELLA Belt, NP  Date: 06/14/2024 9:29 AM   Virtual Visit via Video Note   I, Jon CHRISTELLA Belt, connected with  Raymond Rose  ([id number], September 08, 1975) on 06/14/2024 at  9:15 AM EST by a video-enabled telemedicine application and verified that I am speaking with the correct person using two identifiers.  Location: Patient: Virtual Visit  Location Patient: Home Provider: Virtual Visit Location Provider: Home Office   I discussed the limitations of evaluation and management by telemedicine and the availability of in person appointments. The patient expressed understanding and agreed to proceed.    History of Present Illness: Raymond Rose is a 48 y.o. who identifies as a male who was assigned male at birth, and is being seen today for bothersome cough.   Sx started with tickle in chest after xmas. Started with Cough. Then got runny nose. Is Dry cough, not productive. Dizzy today; Vomiting since 06/12/24, has cyclical vomiting syndrome; typically vomits several times a day anyway, worse with this illness. Takes compazine  4-5 times a day. Is drinking gatorade. Making yellow/not dark urine still. Has L upper back soreness. No fever. Not SOB. Chest only hurts when coughing. Taking Dayquil and Nyquil.   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   Microalbuminuria 08/23/2019   Personality disorder, unspecified (HCC) 07/12/2019   Depression with anxiety 07/03/2019   GAD (generalized anxiety disorder) 04/21/2019   Social  anxiety disorder 04/21/2019   Erectile dysfunction 10/03/2017   Anxiety 08/08/2017   Elevated transaminase level 08/28/2016   Rectus diastasis 02/14/2016   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   Panic attacks 06/22/2015   MDD (major depressive disorder), recurrent episode, moderate (HCC) 06/22/2015   Diabetes mellitus (HCC) 01/03/2015   Eating  disorder 01/03/2015   GERD (gastroesophageal reflux disease) 01/03/2015   Hearing loss 01/03/2015   Leg cramps 01/03/2015   Chronic lower back pain 01/03/2015   Adrenal mass, right (HCC) 06/18/2012   Obstructive sleep apnea 07/08/2010   Cluster headache 12/01/2008   Fam hx-ischem heart disease 10/03/2008   Obesity, Class III, BMI 40-49.9 (morbid obesity) (HCC) 09/30/2008   Hyperlipidemia, mixed 12/12/2004    Allergies: Allergies[1] Medications: Current Medications[2]  Observations/Objective: Patient is well-developed, well-nourished in no acute distress.  Resting comfortably  at home.  Head is normocephalic, atraumatic.  No labored breathing.  Speech is clear and coherent with logical content.  Patient is alert and oriented at baseline.    Assessment and Plan: 1. Acute cough (Primary) - benzonatate  (TESSALON ) 100 MG capsule; Take 1 capsule (100 mg total) by mouth 2 (two) times daily as needed for cough.  Dispense: 20 capsule; Refill: 0 - azithromycin  (ZITHROMAX ) 250 MG tablet; Take 2 tablets on day 1, then 1 tablet daily on days 2 through 5  Dispense: 6 tablet; Refill: 0  2. Cyclic vomiting syndrome  His sx started with cough, which is a bit unusual for URI. I am concerned for a pneumonia. I think dizzy feeling is due to vomiting; we discussed if he cannot stay hydrated at home he will need to go to ED.   Follow Up Instructions: I discussed the assessment and treatment plan with the patient. The patient was provided an opportunity to ask questions and all were answered. The patient agreed with the plan and demonstrated an understanding of the instructions.  A copy of instructions were sent to the patient via MyChart unless otherwise noted below.   The patient was advised to call back or seek an in-person evaluation if the symptoms worsen or if the condition fails to improve as anticipated.    Jon CHRISTELLA Belt, NP    [1]  Allergies Allergen Reactions   Bupropion      Extreme  anxiety   Morphine  Other (See Comments) and Nausea Only    Other Reaction(s): Other (See Comments), unknown  [2]  Current Outpatient Medications:    azithromycin  (ZITHROMAX ) 250 MG tablet, Take 2 tablets on day 1, then 1 tablet daily on days 2 through 5, Disp: 6 tablet, Rfl: 0   benzonatate  (TESSALON ) 100 MG capsule, Take 1 capsule (100 mg total) by mouth 2 (two) times daily as needed for cough., Disp: 20 capsule, Rfl: 0   alfuzosin  (UROXATRAL ) 10 MG 24 hr tablet, TAKE 1 TABLET (10 MG TOTAL) BY MOUTH DAILY WITH BREAKFAST., Disp: 90 tablet, Rfl: 3   ALPRAZolam  (XANAX ) 0.5 MG tablet, TAKE 1 TABLET BY MOUTH EVERY 4 HOURS AS NEEDED FOR ANXIETY, Disp: 30 tablet, Rfl: 1   ARIPiprazole  (ABILIFY ) 2 MG tablet, TAKE 1 TABLET BY MOUTH EVERY DAY, Disp: 30 tablet, Rfl: 2   blood glucose meter kit and supplies KIT, Dispense based on patient and insurance preference. Use up to four times daily as directed. (FOR ICD-9 250.00, 250.01)., Disp: 1 each, Rfl: 0   Continuous Glucose Receiver (FREESTYLE LIBRE 3 READER) DEVI, Use to check sugar up to four times a day as needed for uncontrolled insulin  dependent  type 2 diabetes., Disp: 1 each, Rfl: 1   Continuous Glucose Sensor (FREESTYLE LIBRE 3 PLUS SENSOR) MISC, Use to check blood sugar as needed for type 2 diabetes. Change sensor every 15 days., Disp: 6 each, Rfl: 3   finasteride  (PROSCAR ) 5 MG tablet, TAKE 1 TABLET (5 MG TOTAL) BY MOUTH DAILY., Disp: 90 tablet, Rfl: 2   fluconazole  (DIFLUCAN ) 150 MG tablet, TAKE 1 TABLET BY MOUTH AS ONE DOSE, Disp: 1 tablet, Rfl: 3   insulin  aspart (NOVOLOG  FLEXPEN) 100 UNIT/ML FlexPen, Inject 16 Units into the skin 2 (two) times daily., Disp: , Rfl:    Insulin  Pen Needle (EMBECTA PEN NEEDLE NANO 2 GEN) 32G X 4 MM MISC, USE AS DIRECTED 4 TIMES A DAY, Disp: 360 each, Rfl: 3   lamoTRIgine  (LAMICTAL ) 200 MG tablet, Take 2 tablets (400 mg total) by mouth daily., Disp: 60 tablet, Rfl: 2   LANTUS  SOLOSTAR 100 UNIT/ML Solostar Pen, Inject 8  Units into the skin daily., Disp: , Rfl:    meloxicam  (MOBIC ) 15 MG tablet, Take 1 tablet (15 mg total) by mouth daily., Disp: 44 tablet, Rfl: 0   metFORMIN  (GLUCOPHAGE -XR) 500 MG 24 hr tablet, TAKE 2 TABLETS BY MOUTH TWICE A DAY, Disp: 120 tablet, Rfl: 12   omeprazole  (PRILOSEC) 40 MG capsule, TAKE 1 CAPSULE BY MOUTH EVERY DAY, Disp: 30 capsule, Rfl: 5   oxyCODONE -acetaminophen  (PERCOCET) 7.5-325 MG tablet, Take 1 tablet by mouth every 4 (four) hours as needed., Disp: 60 tablet, Rfl: 0   pioglitazone  (ACTOS ) 30 MG tablet, TAKE 1 TABLET BY MOUTH EVERY DAY, Disp: 30 tablet, Rfl: 5   pregabalin  (LYRICA ) 75 MG capsule, Take 1 capsule (75 mg total) by mouth 2 (two) times daily., Disp: 60 capsule, Rfl: 3   prochlorperazine  (COMPAZINE ) 10 MG tablet, TAKE 1/2 - 1 TABLET BY MOUTH EVERY 6 (SIX) HOURS AS NEEDED FOR NAUSEA OR VOMITING., Disp: 90 tablet, Rfl: 2   promethazine  (PHENERGAN ) 25 MG tablet, Take 25 mg by mouth every 6 (six) hours as needed for nausea or vomiting., Disp: , Rfl:    rosuvastatin  (CRESTOR ) 20 MG tablet, TAKE 1 TABLET BY MOUTH EVERY DAY, Disp: 90 tablet, Rfl: 4   sildenafil  (VIAGRA ) 50 MG tablet, Take 1 tablet (50 mg total) by mouth daily as needed for erectile dysfunction., Disp: 30 tablet, Rfl: 5   tirzepatide  (MOUNJARO ) 5 MG/0.5ML Pen, INJECT 5 MG SUBCUTANEOUSLY WEEKLY, Disp: 6 mL, Rfl: 1   Vilazodone  HCl (VIIBRYD ) 40 MG TABS, Take 1 tablet (40 mg total) by mouth daily., Disp: 30 tablet, Rfl: 2  "

## 2024-06-21 ENCOUNTER — Other Ambulatory Visit: Payer: Self-pay | Admitting: Family Medicine

## 2024-06-21 DIAGNOSIS — G8929 Other chronic pain: Secondary | ICD-10-CM

## 2024-06-21 NOTE — Telephone Encounter (Signed)
 Copied from CRM [id number]. Topic: Clinical - Medication Refill >> Jun 21, 2024  9:46 AM Tiffany B wrote: Medication: oxyCODONE -acetaminophen  (PERCOCET) 7.5-325 MG tablet  Has the patient contacted their pharmacy? Yes  (Agent: If yes, when and what did the pharmacy advise?) Contact PCP office   This is the patient's preferred pharmacy:  [phone] [facility] [place] - [address] [place] [address] Phone: [phone] Fax: [phone]  Is this the correct pharmacy for this prescription?    Has the prescription been filled recently? Yes  Is the patient out of the medication? Yes  Has the patient been seen for an appointment in the last year OR does the patient have an upcoming appointment? Yes  Can we respond through MyChart? Yes  Agent: Please be advised that Rx refills may take up to 3 business days. We ask that you follow-up with your pharmacy.

## 2024-06-21 NOTE — Telephone Encounter (Unsigned)
 Copied from CRM #[id number]. Topic: Clinical - Medication Refill >> Jun 21, 2024  9:39 AM Zebedee SAUNDERS wrote: Medication: oxyCODONE -acetaminophen  (PERCOCET) 7.5-325 MG tablet  Has the patient contacted their pharmacy? Yes (Agent: If no, request that the patient contact the pharmacy for the refill. If patient does not wish to contact the pharmacy document the reason why and proceed with request.) (Agent: If yes, when and what did the pharmacy advise?)  This is the patient's preferred pharmacy:  [facility] [id number] [place] - [address] [place] [address] Phone: [phone] Fax: [phone]  Is this the correct pharmacy for this prescription? Yes If no, delete pharmacy and type the correct one.   Has the prescription been filled recently? Yes  Is the patient out of the medication? Yes  Has the patient been seen for an appointment in the last year OR does the patient have an upcoming appointment? Yes  Can we respond through MyChart? Yes  Agent: Please be advised that Rx refills may take up to 3 business days. We ask that you follow-up with your pharmacy.

## 2024-06-22 MED ORDER — OXYCODONE-ACETAMINOPHEN 7.5-325 MG PO TABS: 1.0000 | ORAL_TABLET | ORAL | 0 refills | Status: DC | PRN

## 2024-06-22 NOTE — Telephone Encounter (Signed)
 Requested medication (s) are due for refill today: yes  Requested medication (s) are on the active medication list: yes  Last refill:  06/09/24  Future visit scheduled: yes  Notes to clinic:  Unable to refill per protocol, cannot delegate.      Requested Prescriptions  Pending Prescriptions Disp Refills   oxyCODONE -acetaminophen  (PERCOCET) 7.5-325 MG tablet 60 tablet 0    Sig: Take 1 tablet by mouth every 4 (four) hours as needed.     Not Delegated - Analgesics:  Opioid Agonist Combinations Failed - 06/22/2024  2:32 PM      Failed - This refill cannot be delegated      Failed - Urine Drug Screen completed in last 360 days      Passed - Valid encounter within last 3 months    Recent Outpatient Visits           1 month ago DISH (diffuse idiopathic skeletal hyperostosis)   [facility] Gas[facility] Nancyann BRAVO, MD   2 months ago Midline back pain, unspecified back location, unspecified chronicity   [place] [facility] Gasper Nancyann BRAVO, MD   3 months ago Type 2 diabetes mellitus without complication, without long-term current use of insulin  [facility])   [place] Gas[facility] Nancyann BRAVO, MD   5 months ago High risk medication use   [place] [facility] Gasper Nancyann BRAVO, MD   6 months ago Type 2 diabetes mellitus without complication, without long-term current use of insulin  [facility])   [facility] Gasper, Nancyann BRAVO, MD

## 2024-06-22 NOTE — Telephone Encounter (Signed)
 Requested medication (s) are due for refill today - provider review   Requested medication (s) are on the active medication list -yes  Future visit scheduled -yes  Last refill: 06/09/24 #60  Notes to clinic: non delegated Rx  Requested Prescriptions  Pending Prescriptions Disp Refills   oxyCODONE -acetaminophen  (PERCOCET) 7.5-325 MG tablet 60 tablet 0    Sig: Take 1 tablet by mouth every 4 (four) hours as needed.     Not Delegated - Analgesics:  Opioid Agonist Combinations Failed - 06/22/2024  2:16 PM      Failed - This refill cannot be delegated      Failed - Urine Drug Screen completed in last 360 days      Passed - Valid encounter within last 3 months    Recent Outpatient Visits           1 month ago DISH (diffuse idiopathic skeletal hyperostosis)   [place] [facility] Gasper Nancyann BRAVO, MD   2 months ago Midline back pain, unspecified back location, unspecified chronicity   [place] [facility] Gasper Nancyann BRAVO, MD   3 months ago Type 2 diabetes mellitus without complication, without long-term current use of insulin  [facility])   [place] [facility] Gasper Nancyann BRAVO, MD   5 months ago High risk medication use   [place] [facility] Gasper Nancyann BRAVO, MD   6 months ago Type 2 diabetes mellitus without complication, without long-term current use of insulin  [facility])   [place] [facility] [facility] Gasper Nancyann BRAVO, MD                 Requested Prescriptions  Pending Prescriptions Disp Refills   oxyCODONE -acetaminophen  (PERCOCET) 7.5-325 MG tablet 60 tablet 0    Sig: Take 1 tablet by mouth every 4 (four) hours as needed.     Not Delegated - Analgesics:  Opioid Agonist Combinations Failed - 06/22/2024  2:16 PM      Failed - This refill cannot be delegated      Failed - Urine Drug Screen completed in last 360 days      Passed - Valid encounter within last 3 months    Recent Outpatient Visits            1 month ago DISH (diffuse idiopathic skeletal hyperostosis)   [place] [facility] Gasper Nancyann BRAVO, MD   2 months ago Midline back pain, unspecified back location, unspecified chronicity   [place] [facility] Gasper Nancyann BRAVO, MD   3 months ago Type 2 diabetes mellitus without complication, without long-term current use of insulin  [facility])   [place] [facility] Gasper Nancyann BRAVO, MD   5 months ago High risk medication use   [place] [facility] Gasper Nancyann BRAVO, MD   6 months ago Type 2 diabetes mellitus without complication, without long-term current use of insulin  [facility])   [place] [facility] Gasper Nancyann BRAVO, MD

## 2024-07-02 ENCOUNTER — Telehealth: Payer: Self-pay | Admitting: Family Medicine

## 2024-07-02 ENCOUNTER — Other Ambulatory Visit: Payer: Self-pay

## 2024-07-02 DIAGNOSIS — M545 Low back pain, unspecified: Secondary | ICD-10-CM

## 2024-07-02 NOTE — Telephone Encounter (Signed)
 Copied from CRM [id number]. Topic: Clinical - Medication Refill >> Jul 02, 2024  9:38 AM Delon HERO wrote: Medication: oxyCODONE -acetaminophen  (PERCOCET) 7.5-325 MG tablet [[id number]]  Has the patient contacted their pharmacy? Yes (Agent: If no, request that the patient contact the pharmacy for the refill. If patient does not wish to contact the pharmacy document the reason why and proceed with request.) (Agent: If yes, when and what did the pharmacy advise?)  This is the patient's preferred pharmacy:  [facility] [address], [place] - [address] [place] [address] Phone: [phone] Fax: [phone]     Is this the correct pharmacy for this prescription? Yes If no, delete pharmacy and type the correct one.   Has the prescription been filled recently? Yes  Is the patient out of the medication? Yes  Has the patient been seen for an appointment in the last year OR does the patient have an upcoming appointment? Yes  Can we respond through MyChart? Yes  Agent: Please be advised that Rx refills may take up to 3 business days. We ask that you follow-up with your pharmacy.

## 2024-07-02 NOTE — Telephone Encounter (Signed)
 LOV 05/10/24 NOV 07/12/24 LRF 06/22/24 qty:60 r:0

## 2024-07-02 NOTE — Telephone Encounter (Signed)
 Medication has been routed to PCP for review.

## 2024-07-05 MED ORDER — OXYCODONE-ACETAMINOPHEN 7.5-325 MG PO TABS: 1.0000 | ORAL_TABLET | ORAL | 0 refills | Status: DC | PRN

## 2024-07-12 ENCOUNTER — Ambulatory Visit: Admitting: Family Medicine

## 2024-07-15 ENCOUNTER — Other Ambulatory Visit: Payer: Self-pay | Admitting: Family Medicine

## 2024-07-15 DIAGNOSIS — G8929 Other chronic pain: Secondary | ICD-10-CM

## 2024-07-15 NOTE — Telephone Encounter (Signed)
 Copied from CRM #[id number]. Topic: Clinical - Medication Refill >> Jul 15, 2024 11:44 AM Lonell PEDLAR wrote: Medication:  oxyCODONE -acetaminophen  (PERCOCET) 7.5-325 MG tablet    Has the patient contacted their pharmacy? Yes, must contact pcp (Agent: If no, request that the patient contact the pharmacy for the refill. If patient does not wish to contact the pharmacy document the reason why and proceed with request.) (Agent: If yes, when and what did the pharmacy advise?)  This is the patient's preferred pharmacy:  [facility] [address] [place] - [address] [place] [address] Phone: [phone] Fax: [phone]  Is this the correct pharmacy for this prescription? Yes If no, delete pharmacy and type the correct one.   Has the prescription been filled recently? Yes  Is the patient out of the medication? Yes  Has the patient been seen for an appointment in the last year OR does the patient have an upcoming appointment? Yes  Can we respond through MyChart? Yes  Agent: Please be advised that Rx refills may take up to 3 business days. We ask that you follow-up with your pharmacy.

## 2024-07-16 ENCOUNTER — Ambulatory Visit: Admission: RE | Admit: 2024-07-16 | Source: Home / Self Care | Admitting: Gastroenterology

## 2024-07-16 ENCOUNTER — Encounter: Admission: RE | Payer: Self-pay | Source: Home / Self Care

## 2024-07-16 ENCOUNTER — Other Ambulatory Visit: Payer: Self-pay | Admitting: Family Medicine

## 2024-07-16 DIAGNOSIS — R112 Nausea with vomiting, unspecified: Secondary | ICD-10-CM

## 2024-07-17 MED ORDER — OXYCODONE-ACETAMINOPHEN 7.5-325 MG PO TABS: 1.0000 | ORAL_TABLET | ORAL | 0 refills | Status: AC | PRN

## 2024-07-22 ENCOUNTER — Other Ambulatory Visit: Payer: Self-pay | Admitting: Family Medicine

## 2024-07-23 ENCOUNTER — Other Ambulatory Visit: Payer: Self-pay | Admitting: Family Medicine

## 2024-07-23 ENCOUNTER — Ambulatory Visit: Admitting: Family Medicine

## 2024-07-23 ENCOUNTER — Encounter: Payer: Self-pay | Admitting: Family Medicine

## 2024-07-23 VITALS — BP 113/64 | HR 83 | Ht 73.0 in | Wt 275.0 lb

## 2024-07-23 DIAGNOSIS — F4010 Social phobia, unspecified: Secondary | ICD-10-CM

## 2024-07-23 DIAGNOSIS — F411 Generalized anxiety disorder: Secondary | ICD-10-CM

## 2024-07-23 DIAGNOSIS — F1290 Cannabis use, unspecified, uncomplicated: Secondary | ICD-10-CM

## 2024-07-23 DIAGNOSIS — N529 Male erectile dysfunction, unspecified: Secondary | ICD-10-CM

## 2024-07-23 DIAGNOSIS — Z79899 Other long term (current) drug therapy: Secondary | ICD-10-CM

## 2024-07-23 DIAGNOSIS — F609 Personality disorder, unspecified: Secondary | ICD-10-CM

## 2024-07-23 DIAGNOSIS — F418 Other specified anxiety disorders: Secondary | ICD-10-CM

## 2024-07-23 DIAGNOSIS — E66813 Obesity, class 3: Secondary | ICD-10-CM

## 2024-07-23 DIAGNOSIS — F410 Panic disorder [episodic paroxysmal anxiety] without agoraphobia: Secondary | ICD-10-CM

## 2024-07-23 DIAGNOSIS — K915 Postcholecystectomy syndrome: Secondary | ICD-10-CM

## 2024-07-23 DIAGNOSIS — F419 Anxiety disorder, unspecified: Secondary | ICD-10-CM

## 2024-07-23 DIAGNOSIS — R809 Proteinuria, unspecified: Secondary | ICD-10-CM

## 2024-07-23 DIAGNOSIS — E782 Mixed hyperlipidemia: Secondary | ICD-10-CM

## 2024-07-23 DIAGNOSIS — F5105 Insomnia due to other mental disorder: Secondary | ICD-10-CM

## 2024-07-23 DIAGNOSIS — E119 Type 2 diabetes mellitus without complications: Secondary | ICD-10-CM

## 2024-07-23 DIAGNOSIS — R1115 Cyclical vomiting syndrome unrelated to migraine: Secondary | ICD-10-CM

## 2024-07-23 DIAGNOSIS — G4733 Obstructive sleep apnea (adult) (pediatric): Secondary | ICD-10-CM

## 2024-07-23 DIAGNOSIS — R7401 Elevation of levels of liver transaminase levels: Secondary | ICD-10-CM

## 2024-07-23 DIAGNOSIS — K219 Gastro-esophageal reflux disease without esophagitis: Secondary | ICD-10-CM

## 2024-07-23 DIAGNOSIS — F3341 Major depressive disorder, recurrent, in partial remission: Secondary | ICD-10-CM

## 2024-07-23 LAB — POCT GLYCOSYLATED HEMOGLOBIN (HGB A1C): Hemoglobin A1C: 9.6 % — AB (ref 4.0–5.6)

## 2024-07-23 MED ORDER — TIRZEPATIDE 7.5 MG/0.5ML ~~LOC~~ SOAJ: 7.5000 mg | SUBCUTANEOUS | 2 refills | Status: AC

## 2024-07-23 NOTE — Patient Instructions (Signed)
 SABRA  Please review the attached list of medications and notify my office if there are any errors.   . Please bring all of your medications to every appointment so we can make sure that our medication list is the same as yours.

## 2024-10-20 ENCOUNTER — Ambulatory Visit: Admitting: Family Medicine

## 2025-04-06 ENCOUNTER — Ambulatory Visit
